# Patient Record
Sex: Male | Born: 1940 | Race: White | Hispanic: No | Marital: Married | Smoking: Current every day smoker
Health system: Southern US, Community
[De-identification: ages and names within clinical notes are randomized; demographics above are authoritative.]

## PROBLEM LIST (undated history)

## (undated) DIAGNOSIS — S82409A Unspecified fracture of shaft of unspecified fibula, initial encounter for closed fracture: Secondary | ICD-10-CM

## (undated) DIAGNOSIS — G250 Essential tremor: Secondary | ICD-10-CM

## (undated) DIAGNOSIS — H9190 Unspecified hearing loss, unspecified ear: Secondary | ICD-10-CM

## (undated) DIAGNOSIS — S82209A Unspecified fracture of shaft of unspecified tibia, initial encounter for closed fracture: Secondary | ICD-10-CM

## (undated) DIAGNOSIS — C801 Malignant (primary) neoplasm, unspecified: Secondary | ICD-10-CM

## (undated) DIAGNOSIS — I2510 Atherosclerotic heart disease of native coronary artery without angina pectoris: Secondary | ICD-10-CM

## (undated) DIAGNOSIS — N200 Calculus of kidney: Secondary | ICD-10-CM

## (undated) DIAGNOSIS — G629 Polyneuropathy, unspecified: Secondary | ICD-10-CM

## (undated) DIAGNOSIS — J449 Chronic obstructive pulmonary disease, unspecified: Secondary | ICD-10-CM

## (undated) DIAGNOSIS — I4891 Unspecified atrial fibrillation: Secondary | ICD-10-CM

## (undated) DIAGNOSIS — K219 Gastro-esophageal reflux disease without esophagitis: Secondary | ICD-10-CM

## (undated) DIAGNOSIS — I2699 Other pulmonary embolism without acute cor pulmonale: Secondary | ICD-10-CM

## (undated) DIAGNOSIS — E785 Hyperlipidemia, unspecified: Secondary | ICD-10-CM

## (undated) DIAGNOSIS — I10 Essential (primary) hypertension: Secondary | ICD-10-CM

## (undated) DIAGNOSIS — M8580 Other specified disorders of bone density and structure, unspecified site: Secondary | ICD-10-CM

## (undated) HISTORY — DX: Hyperlipidemia, unspecified: E78.5

## (undated) HISTORY — DX: Unspecified fracture of shaft of unspecified fibula, initial encounter for closed fracture: S82.409A

## (undated) HISTORY — DX: Unspecified fracture of shaft of unspecified tibia, initial encounter for closed fracture: S82.209A

## (undated) HISTORY — DX: Essential tremor: G25.0

## (undated) HISTORY — PX: TONSILLECTOMY: SUR1361

## (undated) HISTORY — DX: Unspecified hearing loss, unspecified ear: H91.90

## (undated) HISTORY — DX: Polyneuropathy, unspecified: G62.9

## (undated) HISTORY — DX: Atherosclerotic heart disease of native coronary artery without angina pectoris: I25.10

## (undated) HISTORY — PX: FIXATION KYPHOPLASTY: SHX860

## (undated) HISTORY — DX: Calculus of kidney: N20.0

## (undated) HISTORY — PX: CHOLECYSTECTOMY: SHX55

## (undated) HISTORY — DX: Other pulmonary embolism without acute cor pulmonale: I26.99

## (undated) HISTORY — PX: CARDIAC CATHETERIZATION: SHX172

## (undated) HISTORY — DX: Unspecified atrial fibrillation: I48.91

## (undated) HISTORY — PX: OTHER SURGICAL HISTORY: SHX169

---

## 1998-03-03 ENCOUNTER — Encounter: Admission: RE | Admit: 1998-03-03 | Discharge: 1998-06-01 | Payer: Self-pay | Admitting: Family Medicine

## 1999-03-24 ENCOUNTER — Ambulatory Visit (HOSPITAL_BASED_OUTPATIENT_CLINIC_OR_DEPARTMENT_OTHER): Admission: RE | Admit: 1999-03-24 | Discharge: 1999-03-24 | Payer: Self-pay | Admitting: Surgery

## 1999-07-02 ENCOUNTER — Emergency Department (HOSPITAL_COMMUNITY): Admission: EM | Admit: 1999-07-02 | Discharge: 1999-07-02 | Payer: Self-pay | Admitting: Emergency Medicine

## 1999-11-08 ENCOUNTER — Inpatient Hospital Stay (HOSPITAL_COMMUNITY): Admission: EM | Admit: 1999-11-08 | Discharge: 1999-11-12 | Payer: Self-pay | Admitting: Internal Medicine

## 1999-11-11 ENCOUNTER — Encounter: Payer: Self-pay | Admitting: Internal Medicine

## 1999-12-04 ENCOUNTER — Encounter: Payer: Self-pay | Admitting: Pulmonary Disease

## 1999-12-04 ENCOUNTER — Ambulatory Visit (HOSPITAL_COMMUNITY): Admission: RE | Admit: 1999-12-04 | Discharge: 1999-12-04 | Payer: Self-pay | Admitting: Pulmonary Disease

## 1999-12-15 ENCOUNTER — Ambulatory Visit (HOSPITAL_COMMUNITY): Admission: RE | Admit: 1999-12-15 | Discharge: 1999-12-15 | Payer: Self-pay | Admitting: Pulmonary Disease

## 1999-12-15 ENCOUNTER — Encounter: Payer: Self-pay | Admitting: Pulmonary Disease

## 2000-01-05 ENCOUNTER — Ambulatory Visit (HOSPITAL_COMMUNITY): Admission: RE | Admit: 2000-01-05 | Discharge: 2000-01-05 | Payer: Self-pay | Admitting: Gastroenterology

## 2000-01-29 ENCOUNTER — Ambulatory Visit: Admission: RE | Admit: 2000-01-29 | Discharge: 2000-01-29 | Payer: Self-pay | Admitting: Pulmonary Disease

## 2001-03-24 ENCOUNTER — Ambulatory Visit (HOSPITAL_BASED_OUTPATIENT_CLINIC_OR_DEPARTMENT_OTHER): Admission: RE | Admit: 2001-03-24 | Discharge: 2001-03-24 | Payer: Self-pay | Admitting: Pulmonary Disease

## 2002-07-30 ENCOUNTER — Ambulatory Visit (HOSPITAL_COMMUNITY): Admission: RE | Admit: 2002-07-30 | Discharge: 2002-07-30 | Payer: Self-pay | Admitting: Gastroenterology

## 2002-10-01 ENCOUNTER — Emergency Department (HOSPITAL_COMMUNITY): Admission: EM | Admit: 2002-10-01 | Discharge: 2002-10-01 | Payer: Self-pay | Admitting: Emergency Medicine

## 2003-12-06 ENCOUNTER — Ambulatory Visit (HOSPITAL_COMMUNITY): Admission: RE | Admit: 2003-12-06 | Discharge: 2003-12-06 | Payer: Self-pay | Admitting: Orthopedic Surgery

## 2004-09-13 ENCOUNTER — Encounter: Admission: RE | Admit: 2004-09-13 | Discharge: 2004-09-13 | Payer: Self-pay | Admitting: Gastroenterology

## 2004-09-21 ENCOUNTER — Encounter: Admission: RE | Admit: 2004-09-21 | Discharge: 2004-09-21 | Payer: Self-pay | Admitting: Gastroenterology

## 2005-01-06 ENCOUNTER — Emergency Department (HOSPITAL_COMMUNITY): Admission: EM | Admit: 2005-01-06 | Discharge: 2005-01-06 | Payer: Self-pay | Admitting: Emergency Medicine

## 2005-01-13 ENCOUNTER — Emergency Department (HOSPITAL_COMMUNITY): Admission: EM | Admit: 2005-01-13 | Discharge: 2005-01-13 | Payer: Self-pay | Admitting: Emergency Medicine

## 2005-01-19 ENCOUNTER — Encounter: Admission: RE | Admit: 2005-01-19 | Discharge: 2005-01-19 | Payer: Self-pay | Admitting: Family Medicine

## 2005-01-31 ENCOUNTER — Observation Stay (HOSPITAL_COMMUNITY): Admission: RE | Admit: 2005-01-31 | Discharge: 2005-02-01 | Payer: Self-pay | Admitting: General Surgery

## 2005-01-31 ENCOUNTER — Encounter (INDEPENDENT_AMBULATORY_CARE_PROVIDER_SITE_OTHER): Payer: Self-pay | Admitting: *Deleted

## 2005-07-06 ENCOUNTER — Encounter: Admission: RE | Admit: 2005-07-06 | Discharge: 2005-07-06 | Payer: Self-pay | Admitting: Family Medicine

## 2005-09-25 ENCOUNTER — Encounter: Admission: RE | Admit: 2005-09-25 | Discharge: 2005-09-25 | Payer: Self-pay | Admitting: Gastroenterology

## 2005-09-26 ENCOUNTER — Ambulatory Visit (HOSPITAL_COMMUNITY): Admission: RE | Admit: 2005-09-26 | Discharge: 2005-09-26 | Payer: Self-pay | Admitting: Gastroenterology

## 2005-09-26 ENCOUNTER — Encounter (INDEPENDENT_AMBULATORY_CARE_PROVIDER_SITE_OTHER): Payer: Self-pay | Admitting: *Deleted

## 2005-09-28 ENCOUNTER — Ambulatory Visit (HOSPITAL_COMMUNITY): Admission: RE | Admit: 2005-09-28 | Discharge: 2005-09-28 | Payer: Self-pay | Admitting: Gastroenterology

## 2005-10-02 ENCOUNTER — Ambulatory Visit (HOSPITAL_COMMUNITY): Admission: RE | Admit: 2005-10-02 | Discharge: 2005-10-02 | Payer: Self-pay | Admitting: Gastroenterology

## 2005-12-19 ENCOUNTER — Encounter: Admission: RE | Admit: 2005-12-19 | Discharge: 2005-12-19 | Payer: Self-pay | Admitting: Family Medicine

## 2005-12-26 ENCOUNTER — Ambulatory Visit (HOSPITAL_COMMUNITY): Admission: RE | Admit: 2005-12-26 | Discharge: 2005-12-26 | Payer: Self-pay | Admitting: Family Medicine

## 2006-01-10 HISTORY — PX: LUNG CANCER SURGERY: SHX702

## 2006-01-11 ENCOUNTER — Ambulatory Visit: Admission: RE | Admit: 2006-01-11 | Discharge: 2006-04-11 | Payer: Self-pay | Admitting: Radiation Oncology

## 2006-01-15 ENCOUNTER — Ambulatory Visit: Admission: RE | Admit: 2006-01-15 | Discharge: 2006-01-15 | Payer: Self-pay | Admitting: Thoracic Surgery

## 2006-01-16 ENCOUNTER — Ambulatory Visit (HOSPITAL_COMMUNITY): Admission: RE | Admit: 2006-01-16 | Discharge: 2006-01-16 | Payer: Self-pay | Admitting: Thoracic Surgery

## 2006-02-05 ENCOUNTER — Inpatient Hospital Stay (HOSPITAL_COMMUNITY): Admission: RE | Admit: 2006-02-05 | Discharge: 2006-02-10 | Payer: Self-pay | Admitting: Thoracic Surgery

## 2006-02-05 ENCOUNTER — Encounter (INDEPENDENT_AMBULATORY_CARE_PROVIDER_SITE_OTHER): Payer: Self-pay | Admitting: *Deleted

## 2006-02-07 ENCOUNTER — Ambulatory Visit: Payer: Self-pay | Admitting: Internal Medicine

## 2006-02-08 ENCOUNTER — Ambulatory Visit: Payer: Self-pay | Admitting: Internal Medicine

## 2006-02-12 ENCOUNTER — Encounter: Admission: RE | Admit: 2006-02-12 | Discharge: 2006-02-12 | Payer: Self-pay | Admitting: Thoracic Surgery

## 2006-02-27 ENCOUNTER — Encounter: Admission: RE | Admit: 2006-02-27 | Discharge: 2006-02-27 | Payer: Self-pay | Admitting: Thoracic Surgery

## 2006-03-07 LAB — CBC WITH DIFFERENTIAL/PLATELET
BASO%: 0.4 % (ref 0.0–2.0)
Basophils Absolute: 0 10*3/uL (ref 0.0–0.1)
EOS%: 3.4 % (ref 0.0–7.0)
Eosinophils Absolute: 0.3 10*3/uL (ref 0.0–0.5)
HCT: 39 % (ref 38.7–49.9)
HGB: 13.1 g/dL (ref 13.0–17.1)
LYMPH%: 16.3 % (ref 14.0–48.0)
MCH: 26.8 pg — ABNORMAL LOW (ref 28.0–33.4)
MCHC: 33.8 g/dL (ref 32.0–35.9)
MCV: 79.5 fL — ABNORMAL LOW (ref 81.6–98.0)
MONO#: 1.1 10*3/uL — ABNORMAL HIGH (ref 0.1–0.9)
MONO%: 11.5 % (ref 0.0–13.0)
NEUT#: 6.5 10*3/uL (ref 1.5–6.5)
NEUT%: 68.4 % (ref 40.0–75.0)
Platelets: 343 10*3/uL (ref 145–400)
RBC: 4.9 10*6/uL (ref 4.20–5.71)
RDW: 15.5 % — ABNORMAL HIGH (ref 11.2–14.6)
WBC: 9.5 10*3/uL (ref 4.0–10.0)
lymph#: 1.6 10*3/uL (ref 0.9–3.3)

## 2006-03-07 LAB — COMPREHENSIVE METABOLIC PANEL
ALT: 19 U/L (ref 0–40)
AST: 16 U/L (ref 0–37)
Albumin: 4.2 g/dL (ref 3.5–5.2)
Alkaline Phosphatase: 69 U/L (ref 39–117)
BUN: 18 mg/dL (ref 6–23)
CO2: 25 mEq/L (ref 19–32)
Calcium: 9.4 mg/dL (ref 8.4–10.5)
Chloride: 99 mEq/L (ref 96–112)
Creatinine, Ser: 0.9 mg/dL (ref 0.4–1.5)
Glucose, Bld: 93 mg/dL (ref 70–99)
Potassium: 4.4 mEq/L (ref 3.5–5.3)
Sodium: 136 mEq/L (ref 135–145)
Total Bilirubin: 0.3 mg/dL (ref 0.3–1.2)
Total Protein: 6.8 g/dL (ref 6.0–8.3)

## 2006-03-27 ENCOUNTER — Encounter: Admission: RE | Admit: 2006-03-27 | Discharge: 2006-03-27 | Payer: Self-pay | Admitting: Thoracic Surgery

## 2006-05-28 ENCOUNTER — Encounter: Admission: RE | Admit: 2006-05-28 | Discharge: 2006-05-28 | Payer: Self-pay | Admitting: Thoracic Surgery

## 2006-06-26 ENCOUNTER — Ambulatory Visit: Payer: Self-pay | Admitting: Internal Medicine

## 2006-07-01 LAB — CBC WITH DIFFERENTIAL/PLATELET
BASO%: 0.2 % (ref 0.0–2.0)
Basophils Absolute: 0 10*3/uL (ref 0.0–0.1)
EOS%: 0.6 % (ref 0.0–7.0)
Eosinophils Absolute: 0.1 10*3/uL (ref 0.0–0.5)
HCT: 37.9 % — ABNORMAL LOW (ref 38.7–49.9)
HGB: 12.8 g/dL — ABNORMAL LOW (ref 13.0–17.1)
LYMPH%: 7.7 % — ABNORMAL LOW (ref 14.0–48.0)
MCH: 27.2 pg — ABNORMAL LOW (ref 28.0–33.4)
MCHC: 33.8 g/dL (ref 32.0–35.9)
MCV: 80.5 fL — ABNORMAL LOW (ref 81.6–98.0)
MONO#: 0.3 10*3/uL (ref 0.1–0.9)
MONO%: 2.9 % (ref 0.0–13.0)
NEUT#: 8 10*3/uL — ABNORMAL HIGH (ref 1.5–6.5)
NEUT%: 88.6 % — ABNORMAL HIGH (ref 40.0–75.0)
Platelets: 251 10*3/uL (ref 145–400)
RBC: 4.71 10*6/uL (ref 4.20–5.71)
RDW: 16.4 % — ABNORMAL HIGH (ref 11.2–14.6)
WBC: 9 10*3/uL (ref 4.0–10.0)
lymph#: 0.7 10*3/uL — ABNORMAL LOW (ref 0.9–3.3)

## 2006-07-01 LAB — COMPREHENSIVE METABOLIC PANEL
ALT: 22 U/L (ref 0–40)
AST: 16 U/L (ref 0–37)
Albumin: 4.4 g/dL (ref 3.5–5.2)
Alkaline Phosphatase: 53 U/L (ref 39–117)
BUN: 23 mg/dL (ref 6–23)
CO2: 18 mEq/L — ABNORMAL LOW (ref 19–32)
Calcium: 9.5 mg/dL (ref 8.4–10.5)
Chloride: 102 mEq/L (ref 96–112)
Creatinine, Ser: 1.02 mg/dL (ref 0.40–1.50)
Glucose, Bld: 302 mg/dL — ABNORMAL HIGH (ref 70–99)
Potassium: 4.7 mEq/L (ref 3.5–5.3)
Sodium: 137 mEq/L (ref 135–145)
Total Bilirubin: 0.2 mg/dL — ABNORMAL LOW (ref 0.3–1.2)
Total Protein: 6.9 g/dL (ref 6.0–8.3)

## 2006-07-02 ENCOUNTER — Ambulatory Visit (HOSPITAL_COMMUNITY): Admission: RE | Admit: 2006-07-02 | Discharge: 2006-07-02 | Payer: Self-pay | Admitting: Internal Medicine

## 2006-09-17 ENCOUNTER — Ambulatory Visit: Payer: Self-pay | Admitting: Internal Medicine

## 2006-09-19 LAB — CBC WITH DIFFERENTIAL/PLATELET
BASO%: 0.3 % (ref 0.0–2.0)
Basophils Absolute: 0 10*3/uL (ref 0.0–0.1)
EOS%: 3.8 % (ref 0.0–7.0)
Eosinophils Absolute: 0.3 10*3/uL (ref 0.0–0.5)
HCT: 40.2 % (ref 38.7–49.9)
HGB: 13.6 g/dL (ref 13.0–17.1)
LYMPH%: 18.1 % (ref 14.0–48.0)
MCH: 27.6 pg — ABNORMAL LOW (ref 28.0–33.4)
MCHC: 33.9 g/dL (ref 32.0–35.9)
MCV: 81.5 fL — ABNORMAL LOW (ref 81.6–98.0)
MONO#: 0.7 10*3/uL (ref 0.1–0.9)
MONO%: 9.8 % (ref 0.0–13.0)
NEUT#: 4.5 10*3/uL (ref 1.5–6.5)
NEUT%: 68 % (ref 40.0–75.0)
Platelets: 265 10*3/uL (ref 145–400)
RBC: 4.94 10*6/uL (ref 4.20–5.71)
RDW: 16.1 % — ABNORMAL HIGH (ref 11.2–14.6)
WBC: 6.6 10*3/uL (ref 4.0–10.0)
lymph#: 1.2 10*3/uL (ref 0.9–3.3)

## 2006-09-19 LAB — COMPREHENSIVE METABOLIC PANEL
ALT: 22 U/L (ref 0–53)
AST: 15 U/L (ref 0–37)
Albumin: 4.2 g/dL (ref 3.5–5.2)
Alkaline Phosphatase: 57 U/L (ref 39–117)
BUN: 18 mg/dL (ref 6–23)
CO2: 25 mEq/L (ref 19–32)
Calcium: 9.7 mg/dL (ref 8.4–10.5)
Chloride: 102 mEq/L (ref 96–112)
Creatinine, Ser: 0.88 mg/dL (ref 0.40–1.50)
Glucose, Bld: 162 mg/dL — ABNORMAL HIGH (ref 70–99)
Potassium: 4.4 mEq/L (ref 3.5–5.3)
Sodium: 140 mEq/L (ref 135–145)
Total Bilirubin: 0.4 mg/dL (ref 0.3–1.2)
Total Protein: 6.7 g/dL (ref 6.0–8.3)

## 2006-09-23 ENCOUNTER — Ambulatory Visit (HOSPITAL_COMMUNITY): Admission: RE | Admit: 2006-09-23 | Discharge: 2006-09-23 | Payer: Self-pay | Admitting: Internal Medicine

## 2006-09-25 ENCOUNTER — Encounter: Admission: RE | Admit: 2006-09-25 | Discharge: 2006-09-25 | Payer: Self-pay | Admitting: Thoracic Surgery

## 2007-03-18 ENCOUNTER — Ambulatory Visit: Payer: Self-pay | Admitting: Internal Medicine

## 2007-03-20 LAB — COMPREHENSIVE METABOLIC PANEL
ALT: 19 U/L (ref 0–53)
AST: 14 U/L (ref 0–37)
Albumin: 4.2 g/dL (ref 3.5–5.2)
Alkaline Phosphatase: 58 U/L (ref 39–117)
BUN: 23 mg/dL (ref 6–23)
CO2: 21 mEq/L (ref 19–32)
Calcium: 9.4 mg/dL (ref 8.4–10.5)
Chloride: 105 mEq/L (ref 96–112)
Creatinine, Ser: 0.93 mg/dL (ref 0.40–1.50)
Glucose, Bld: 133 mg/dL — ABNORMAL HIGH (ref 70–99)
Potassium: 4.2 mEq/L (ref 3.5–5.3)
Sodium: 138 mEq/L (ref 135–145)
Total Bilirubin: 0.2 mg/dL — ABNORMAL LOW (ref 0.3–1.2)
Total Protein: 6.5 g/dL (ref 6.0–8.3)

## 2007-03-20 LAB — CBC WITH DIFFERENTIAL/PLATELET
BASO%: 0.4 % (ref 0.0–2.0)
Basophils Absolute: 0 10*3/uL (ref 0.0–0.1)
EOS%: 2.9 % (ref 0.0–7.0)
Eosinophils Absolute: 0.3 10*3/uL (ref 0.0–0.5)
HCT: 39.3 % (ref 38.7–49.9)
HGB: 13.7 g/dL (ref 13.0–17.1)
LYMPH%: 20 % (ref 14.0–48.0)
MCH: 28.1 pg (ref 28.0–33.4)
MCHC: 34.8 g/dL (ref 32.0–35.9)
MCV: 80.7 fL — ABNORMAL LOW (ref 81.6–98.0)
MONO#: 0.8 10*3/uL (ref 0.1–0.9)
MONO%: 8.6 % (ref 0.0–13.0)
NEUT#: 6.2 10*3/uL (ref 1.5–6.5)
NEUT%: 68.1 % (ref 40.0–75.0)
Platelets: 279 10*3/uL (ref 145–400)
RBC: 4.87 10*6/uL (ref 4.20–5.71)
RDW: 15.6 % — ABNORMAL HIGH (ref 11.2–14.6)
WBC: 9.2 10*3/uL (ref 4.0–10.0)
lymph#: 1.8 10*3/uL (ref 0.9–3.3)

## 2007-03-24 ENCOUNTER — Ambulatory Visit (HOSPITAL_COMMUNITY): Admission: RE | Admit: 2007-03-24 | Discharge: 2007-03-24 | Payer: Self-pay | Admitting: Internal Medicine

## 2007-04-09 ENCOUNTER — Ambulatory Visit: Payer: Self-pay | Admitting: Thoracic Surgery

## 2007-07-16 ENCOUNTER — Ambulatory Visit: Payer: Self-pay | Admitting: Thoracic Surgery

## 2007-07-16 ENCOUNTER — Encounter: Admission: RE | Admit: 2007-07-16 | Discharge: 2007-07-16 | Payer: Self-pay | Admitting: Thoracic Surgery

## 2007-09-12 ENCOUNTER — Ambulatory Visit: Payer: Self-pay | Admitting: Internal Medicine

## 2007-09-16 LAB — CBC WITH DIFFERENTIAL/PLATELET
BASO%: 0.4 % (ref 0.0–2.0)
Basophils Absolute: 0 10*3/uL (ref 0.0–0.1)
EOS%: 2.2 % (ref 0.0–7.0)
Eosinophils Absolute: 0.2 10*3/uL (ref 0.0–0.5)
HCT: 40 % (ref 38.7–49.9)
HGB: 13.7 g/dL (ref 13.0–17.1)
LYMPH%: 17.7 % (ref 14.0–48.0)
MCH: 28 pg (ref 28.0–33.4)
MCHC: 34.1 g/dL (ref 32.0–35.9)
MCV: 81.9 fL (ref 81.6–98.0)
MONO#: 0.6 10*3/uL (ref 0.1–0.9)
MONO%: 7.9 % (ref 0.0–13.0)
NEUT#: 5.2 10*3/uL (ref 1.5–6.5)
NEUT%: 71.8 % (ref 40.0–75.0)
Platelets: 232 10*3/uL (ref 145–400)
RBC: 4.89 10*6/uL (ref 4.20–5.71)
RDW: 16.1 % — ABNORMAL HIGH (ref 11.2–14.6)
WBC: 7.2 10*3/uL (ref 4.0–10.0)
lymph#: 1.3 10*3/uL (ref 0.9–3.3)

## 2007-09-16 LAB — COMPREHENSIVE METABOLIC PANEL
ALT: 22 U/L (ref 0–53)
AST: 15 U/L (ref 0–37)
Albumin: 4.3 g/dL (ref 3.5–5.2)
Alkaline Phosphatase: 52 U/L (ref 39–117)
BUN: 23 mg/dL (ref 6–23)
CO2: 23 mEq/L (ref 19–32)
Calcium: 9.6 mg/dL (ref 8.4–10.5)
Chloride: 104 mEq/L (ref 96–112)
Creatinine, Ser: 1.02 mg/dL (ref 0.40–1.50)
Glucose, Bld: 173 mg/dL — ABNORMAL HIGH (ref 70–99)
Potassium: 4.4 mEq/L (ref 3.5–5.3)
Sodium: 138 mEq/L (ref 135–145)
Total Bilirubin: 0.5 mg/dL (ref 0.3–1.2)
Total Protein: 6.8 g/dL (ref 6.0–8.3)

## 2007-09-18 ENCOUNTER — Ambulatory Visit (HOSPITAL_COMMUNITY): Admission: RE | Admit: 2007-09-18 | Discharge: 2007-09-18 | Payer: Self-pay | Admitting: Internal Medicine

## 2007-10-14 ENCOUNTER — Ambulatory Visit: Payer: Self-pay | Admitting: Thoracic Surgery

## 2008-03-15 ENCOUNTER — Ambulatory Visit: Payer: Self-pay | Admitting: Internal Medicine

## 2008-03-18 LAB — COMPREHENSIVE METABOLIC PANEL
ALT: 19 U/L (ref 0–53)
AST: 12 U/L (ref 0–37)
Albumin: 4 g/dL (ref 3.5–5.2)
Alkaline Phosphatase: 51 U/L (ref 39–117)
BUN: 24 mg/dL — ABNORMAL HIGH (ref 6–23)
CO2: 22 mEq/L (ref 19–32)
Calcium: 9.1 mg/dL (ref 8.4–10.5)
Chloride: 102 mEq/L (ref 96–112)
Creatinine, Ser: 1.03 mg/dL (ref 0.40–1.50)
Glucose, Bld: 305 mg/dL — ABNORMAL HIGH (ref 70–99)
Potassium: 4.3 mEq/L (ref 3.5–5.3)
Sodium: 136 mEq/L (ref 135–145)
Total Bilirubin: 0.3 mg/dL (ref 0.3–1.2)
Total Protein: 6.3 g/dL (ref 6.0–8.3)

## 2008-03-18 LAB — CBC WITH DIFFERENTIAL/PLATELET
BASO%: 0.2 % (ref 0.0–2.0)
Basophils Absolute: 0 10*3/uL (ref 0.0–0.1)
EOS%: 2.7 % (ref 0.0–7.0)
Eosinophils Absolute: 0.2 10*3/uL (ref 0.0–0.5)
HCT: 37.3 % — ABNORMAL LOW (ref 38.7–49.9)
HGB: 12.9 g/dL — ABNORMAL LOW (ref 13.0–17.1)
LYMPH%: 15.2 % (ref 14.0–48.0)
MCH: 28.1 pg (ref 28.0–33.4)
MCHC: 34.4 g/dL (ref 32.0–35.9)
MCV: 81.7 fL (ref 81.6–98.0)
MONO#: 0.7 10*3/uL (ref 0.1–0.9)
MONO%: 8 % (ref 0.0–13.0)
NEUT#: 6.1 10*3/uL (ref 1.5–6.5)
NEUT%: 73.9 % (ref 40.0–75.0)
Platelets: 262 10*3/uL (ref 145–400)
RBC: 4.57 10*6/uL (ref 4.20–5.71)
RDW: 15.6 % — ABNORMAL HIGH (ref 11.2–14.6)
WBC: 8.3 10*3/uL (ref 4.0–10.0)
lymph#: 1.3 10*3/uL (ref 0.9–3.3)

## 2008-03-22 ENCOUNTER — Ambulatory Visit (HOSPITAL_COMMUNITY): Admission: RE | Admit: 2008-03-22 | Discharge: 2008-03-22 | Payer: Self-pay | Admitting: Internal Medicine

## 2008-03-31 ENCOUNTER — Ambulatory Visit: Payer: Self-pay | Admitting: Thoracic Surgery

## 2008-09-13 ENCOUNTER — Ambulatory Visit: Payer: Self-pay | Admitting: Internal Medicine

## 2008-09-15 LAB — COMPREHENSIVE METABOLIC PANEL
ALT: 20 U/L (ref 0–53)
AST: 17 U/L (ref 0–37)
Albumin: 4.2 g/dL (ref 3.5–5.2)
Alkaline Phosphatase: 48 U/L (ref 39–117)
BUN: 26 mg/dL — ABNORMAL HIGH (ref 6–23)
CO2: 21 mEq/L (ref 19–32)
Calcium: 9.5 mg/dL (ref 8.4–10.5)
Chloride: 104 mEq/L (ref 96–112)
Creatinine, Ser: 1 mg/dL (ref 0.40–1.50)
Glucose, Bld: 190 mg/dL — ABNORMAL HIGH (ref 70–99)
Potassium: 4 mEq/L (ref 3.5–5.3)
Sodium: 139 mEq/L (ref 135–145)
Total Bilirubin: 0.2 mg/dL — ABNORMAL LOW (ref 0.3–1.2)
Total Protein: 6.7 g/dL (ref 6.0–8.3)

## 2008-09-15 LAB — CBC WITH DIFFERENTIAL/PLATELET
BASO%: 0.1 % (ref 0.0–2.0)
Basophils Absolute: 0 10*3/uL (ref 0.0–0.1)
EOS%: 4.4 % (ref 0.0–7.0)
Eosinophils Absolute: 0.3 10*3/uL (ref 0.0–0.5)
HCT: 39.2 % (ref 38.7–49.9)
HGB: 13.2 g/dL (ref 13.0–17.1)
LYMPH%: 17.9 % (ref 14.0–48.0)
MCH: 28.1 pg (ref 28.0–33.4)
MCHC: 33.7 g/dL (ref 32.0–35.9)
MCV: 83.4 fL (ref 81.6–98.0)
MONO#: 0.7 10*3/uL (ref 0.1–0.9)
MONO%: 9 % (ref 0.0–13.0)
NEUT#: 5.2 10*3/uL (ref 1.5–6.5)
NEUT%: 68.6 % (ref 40.0–75.0)
Platelets: 231 10*3/uL (ref 145–400)
RBC: 4.7 10*6/uL (ref 4.20–5.71)
RDW: 15.8 % — ABNORMAL HIGH (ref 11.2–14.6)
WBC: 7.7 10*3/uL (ref 4.0–10.0)
lymph#: 1.4 10*3/uL (ref 0.9–3.3)

## 2008-09-16 ENCOUNTER — Ambulatory Visit (HOSPITAL_COMMUNITY): Admission: RE | Admit: 2008-09-16 | Discharge: 2008-09-16 | Payer: Self-pay | Admitting: Internal Medicine

## 2008-09-21 ENCOUNTER — Ambulatory Visit: Payer: Self-pay | Admitting: Thoracic Surgery

## 2009-04-06 ENCOUNTER — Encounter: Admission: RE | Admit: 2009-04-06 | Discharge: 2009-04-06 | Payer: Self-pay | Admitting: Thoracic Surgery

## 2009-04-06 ENCOUNTER — Ambulatory Visit: Payer: Self-pay | Admitting: Thoracic Surgery

## 2009-05-26 ENCOUNTER — Ambulatory Visit: Payer: Self-pay | Admitting: Surgery

## 2009-05-26 ENCOUNTER — Encounter (INDEPENDENT_AMBULATORY_CARE_PROVIDER_SITE_OTHER): Payer: Self-pay | Admitting: Family Medicine

## 2009-05-26 ENCOUNTER — Ambulatory Visit: Admission: RE | Admit: 2009-05-26 | Discharge: 2009-05-26 | Payer: Self-pay | Admitting: Family Medicine

## 2009-07-23 ENCOUNTER — Encounter: Admission: RE | Admit: 2009-07-23 | Discharge: 2009-07-23 | Payer: Self-pay | Admitting: Orthopedic Surgery

## 2009-09-14 ENCOUNTER — Ambulatory Visit: Payer: Self-pay | Admitting: Internal Medicine

## 2009-09-16 LAB — CBC WITH DIFFERENTIAL/PLATELET
BASO%: 0.3 % (ref 0.0–2.0)
Basophils Absolute: 0 10*3/uL (ref 0.0–0.1)
EOS%: 2.6 % (ref 0.0–7.0)
Eosinophils Absolute: 0.2 10*3/uL (ref 0.0–0.5)
HCT: 39.8 % (ref 38.4–49.9)
HGB: 13.4 g/dL (ref 13.0–17.1)
LYMPH%: 19.7 % (ref 14.0–49.0)
MCH: 28.9 pg (ref 27.2–33.4)
MCHC: 33.6 g/dL (ref 32.0–36.0)
MCV: 85.8 fL (ref 79.3–98.0)
MONO#: 0.7 10*3/uL (ref 0.1–0.9)
MONO%: 8.8 % (ref 0.0–14.0)
NEUT#: 5.1 10*3/uL (ref 1.5–6.5)
NEUT%: 68.6 % (ref 39.0–75.0)
Platelets: 240 10*3/uL (ref 140–400)
RBC: 4.64 10*6/uL (ref 4.20–5.82)
RDW: 15.7 % — ABNORMAL HIGH (ref 11.0–14.6)
WBC: 7.4 10*3/uL (ref 4.0–10.3)
lymph#: 1.5 10*3/uL (ref 0.9–3.3)

## 2009-09-16 LAB — COMPREHENSIVE METABOLIC PANEL
ALT: 27 U/L (ref 0–53)
AST: 24 U/L (ref 0–37)
Albumin: 3.6 g/dL (ref 3.5–5.2)
Alkaline Phosphatase: 43 U/L (ref 39–117)
BUN: 14 mg/dL (ref 6–23)
CO2: 26 mEq/L (ref 19–32)
Calcium: 9.4 mg/dL (ref 8.4–10.5)
Chloride: 106 mEq/L (ref 96–112)
Creatinine, Ser: 0.86 mg/dL (ref 0.40–1.50)
Glucose, Bld: 144 mg/dL — ABNORMAL HIGH (ref 70–99)
Potassium: 4.3 mEq/L (ref 3.5–5.3)
Sodium: 138 mEq/L (ref 135–145)
Total Bilirubin: 0.5 mg/dL (ref 0.3–1.2)
Total Protein: 6.2 g/dL (ref 6.0–8.3)

## 2009-09-19 ENCOUNTER — Ambulatory Visit (HOSPITAL_COMMUNITY): Admission: RE | Admit: 2009-09-19 | Discharge: 2009-09-19 | Payer: Self-pay | Admitting: Internal Medicine

## 2009-09-21 ENCOUNTER — Ambulatory Visit: Payer: Self-pay | Admitting: Thoracic Surgery

## 2010-05-19 ENCOUNTER — Encounter: Payer: Self-pay | Admitting: Physical Medicine and Rehabilitation

## 2010-05-23 ENCOUNTER — Ambulatory Visit (HOSPITAL_COMMUNITY): Admission: RE | Admit: 2010-05-23 | Discharge: 2010-05-23 | Payer: Self-pay | Admitting: Interventional Radiology

## 2010-06-06 ENCOUNTER — Ambulatory Visit (HOSPITAL_COMMUNITY): Admission: RE | Admit: 2010-06-06 | Discharge: 2010-06-06 | Payer: Self-pay | Admitting: Interventional Radiology

## 2010-06-07 ENCOUNTER — Encounter: Payer: Self-pay | Admitting: Interventional Radiology

## 2010-09-13 ENCOUNTER — Ambulatory Visit: Payer: Self-pay | Admitting: Internal Medicine

## 2010-09-15 LAB — CBC WITH DIFFERENTIAL/PLATELET
BASO%: 0.3 % (ref 0.0–2.0)
Basophils Absolute: 0 10*3/uL (ref 0.0–0.1)
EOS%: 2.7 % (ref 0.0–7.0)
Eosinophils Absolute: 0.3 10*3/uL (ref 0.0–0.5)
HCT: 41.4 % (ref 38.4–49.9)
HGB: 14.1 g/dL (ref 13.0–17.1)
LYMPH%: 16.9 % (ref 14.0–49.0)
MCH: 28.5 pg (ref 27.2–33.4)
MCHC: 34.1 g/dL (ref 32.0–36.0)
MCV: 83.6 fL (ref 79.3–98.0)
MONO#: 0.7 10*3/uL (ref 0.1–0.9)
MONO%: 7.4 % (ref 0.0–14.0)
NEUT#: 6.7 10*3/uL — ABNORMAL HIGH (ref 1.5–6.5)
NEUT%: 72.7 % (ref 39.0–75.0)
Platelets: 274 10*3/uL (ref 140–400)
RBC: 4.95 10*6/uL (ref 4.20–5.82)
RDW: 16.1 % — ABNORMAL HIGH (ref 11.0–14.6)
WBC: 9.3 10*3/uL (ref 4.0–10.3)
lymph#: 1.6 10*3/uL (ref 0.9–3.3)

## 2010-09-15 LAB — COMPREHENSIVE METABOLIC PANEL
ALT: 26 U/L (ref 0–53)
AST: 22 U/L (ref 0–37)
Albumin: 3.8 g/dL (ref 3.5–5.2)
Alkaline Phosphatase: 47 U/L (ref 39–117)
BUN: 24 mg/dL — ABNORMAL HIGH (ref 6–23)
CO2: 30 mEq/L (ref 19–32)
Calcium: 9.5 mg/dL (ref 8.4–10.5)
Chloride: 103 mEq/L (ref 96–112)
Creatinine, Ser: 1.17 mg/dL (ref 0.40–1.50)
Glucose, Bld: 178 mg/dL — ABNORMAL HIGH (ref 70–99)
Potassium: 4.3 mEq/L (ref 3.5–5.3)
Sodium: 139 mEq/L (ref 135–145)
Total Bilirubin: 0.6 mg/dL (ref 0.3–1.2)
Total Protein: 6.4 g/dL (ref 6.0–8.3)

## 2010-09-18 ENCOUNTER — Ambulatory Visit (HOSPITAL_COMMUNITY): Admission: RE | Admit: 2010-09-18 | Discharge: 2010-09-18 | Payer: Self-pay | Admitting: Internal Medicine

## 2010-10-30 ENCOUNTER — Encounter
Admission: RE | Admit: 2010-10-30 | Discharge: 2010-10-30 | Payer: Self-pay | Source: Home / Self Care | Attending: Physical Medicine and Rehabilitation | Admitting: Physical Medicine and Rehabilitation

## 2010-12-02 ENCOUNTER — Other Ambulatory Visit: Payer: Self-pay | Admitting: Internal Medicine

## 2010-12-02 DIAGNOSIS — C3490 Malignant neoplasm of unspecified part of unspecified bronchus or lung: Secondary | ICD-10-CM

## 2010-12-03 ENCOUNTER — Encounter: Payer: Self-pay | Admitting: Family Medicine

## 2011-01-28 LAB — BASIC METABOLIC PANEL
BUN: 21 mg/dL (ref 6–23)
CO2: 24 mEq/L (ref 19–32)
Calcium: 9.2 mg/dL (ref 8.4–10.5)
Chloride: 108 mEq/L (ref 96–112)
Creatinine, Ser: 0.88 mg/dL (ref 0.4–1.5)
GFR calc Af Amer: >60 mL/min (ref >60)
GFR calc non Af Amer: >60 mL/min (ref >60)
Glucose, Bld: 127 mg/dL — ABNORMAL HIGH (ref 70–99)
Potassium: 4 mEq/L (ref 3.5–5.1)
Sodium: 140 mEq/L (ref 135–145)

## 2011-01-28 LAB — GLUCOSE, CAPILLARY: Glucose-Capillary: 121 mg/dL — ABNORMAL HIGH (ref 70–99)

## 2011-01-28 LAB — CBC
HCT: 39.6 % (ref 39.0–52.0)
Hemoglobin: 12.8 g/dL — ABNORMAL LOW (ref 13.0–17.0)
MCH: 27.5 pg (ref 26.0–34.0)
MCHC: 32.4 g/dL (ref 30.0–36.0)
MCV: 84.7 fL (ref 78.0–100.0)
Platelets: 207 10*3/uL (ref 150–400)
RBC: 4.67 MIL/uL (ref 4.22–5.81)
RDW: 16.4 % — ABNORMAL HIGH (ref 11.5–15.5)
WBC: 7 10*3/uL (ref 4.0–10.5)

## 2011-01-28 LAB — DIFFERENTIAL
Basophils Absolute: 0 10*3/uL (ref 0.0–0.1)
Basophils Relative: 1 % (ref 0–1)
Eosinophils Absolute: 0.4 10*3/uL (ref 0.0–0.7)
Eosinophils Relative: 5 % (ref 0–5)
Lymphocytes Relative: 15 % (ref 12–46)
Lymphs Abs: 1.1 10*3/uL (ref 0.7–4.0)
Monocytes Absolute: 0.6 10*3/uL (ref 0.1–1.0)
Monocytes Relative: 9 % (ref 3–12)
Neutro Abs: 4.9 10*3/uL (ref 1.7–7.7)
Neutrophils Relative %: 71 % (ref 43–77)

## 2011-01-28 LAB — PROTIME-INR
INR: 0.98 (ref 0.00–1.49)
Prothrombin Time: 12.9 seconds (ref 11.6–15.2)

## 2011-01-28 LAB — APTT: aPTT: 25 seconds (ref 24–37)

## 2011-03-27 ENCOUNTER — Other Ambulatory Visit (HOSPITAL_COMMUNITY): Payer: Self-pay | Admitting: Gastroenterology

## 2011-03-27 DIAGNOSIS — R112 Nausea with vomiting, unspecified: Secondary | ICD-10-CM

## 2011-03-27 NOTE — Assessment & Plan Note (Signed)
OFFICE VISIT   HARLEY, FITZWATER  DOB:  [birth year]                                        October 14, 2007  CHART #:  [id number]   The patient came for followup today.  His blood pressure was 118/71.  Pulse 83.  Respirations 18.  Sats were 94%.  Lungs were clear to  auscultation and percussion.  Heart, regular sinus rhythm.  The patient  was doing well.  His recent CT scan showed no evidence of recurrence.  He has another scheduled in 6 months.  We will see him at that time  after his next CT scan.   Nicanor Alcon, M.D.  Electronically Signed   DPB/MEDQ  D:  10/14/2007  T:  10/14/2007  Job:  [id number]

## 2011-03-27 NOTE — Assessment & Plan Note (Signed)
OFFICE VISIT   Paul [name], Paul [name]  DOB:  04-27-1941                                        September 21, 2008  CHART #:  [id number]   The patient comes today.  His CT scan showed resolution of the ill-  defined opacity in the left lung base with no evidence of recurrent  cancer.  He is now 2-1/2 years since his surgery.  So, I will then see  him back in 6 months with a chest x-ray.  His blood pressure was 141/82,  pulse 97, respirations 20, and sats were 93%.   Nicanor Alcon, M.D.  Electronically Signed   DPB/MEDQ  D:  09/21/2008  T:  09/21/2008  Job:  [id number]   cc:   [name], MD

## 2011-03-27 NOTE — Letter (Signed)
Mar 31, 2008   [name], [place] [place], [place] [address]   Re:  Paul [name], Paul [name]             DOB:  12-28-1940   Dear Julien Nordmann,   I saw Paul [name] today and reviewed his CT scan and it looks like these  are all postoperative changes.  I am not too sure about these irregular  densities, but I would agree with a followup in six months with a CT  scan.   He has recently recovered from a sinus infection.   His blood pressure is 140/83, pulse 88, respirations 18, sats were 95%.  Lungs were clear to auscultation and percussion.   Nicanor Alcon, M.D.  Electronically Signed   DPB/MEDQ  D:  03/31/2008  T:  03/31/2008  Job:  [id number]

## 2011-03-27 NOTE — Assessment & Plan Note (Signed)
OFFICE VISIT   MYLAN, SCHWARZ  DOB:  [birth year]                                        Apr 06, 2009  CHART #:  [id number]   The patient came today for followup.  His chest x-ray showed no evidence  of recurrence of his cancer.  There is chronic bronchitic changes.  He  has got another CT scan scheduled in November.  His blood pressure was  140/80, pulse 80, respirations 18, and sats were 94%.  Lungs are clear  to auscultation and percussion.  He is stable overall.  We will see him  back again in early December.   Nicanor Alcon, M.D.  Electronically Signed   DPB/MEDQ  D:  04/06/2009  T:  04/06/2009  Job:  [id number]

## 2011-03-27 NOTE — Assessment & Plan Note (Signed)
OFFICE VISIT   YAACOV, KOZIOL  DOB:  [birth year]                                        Apr 09, 2007  CHART #:  [id number]   Serita Grammes came in for followup today.  He had a CT scan done by Dr.  Julien Nordmann, which was negative.  There were some inflammatory nodules in  the right middle lobe but where we did his seed implantation in the left  lower lobe was completely negative.  He is doing well overall, except  for a sinus infection.  His blood pressure is 114/69.  Pulse 96.  Respirations 18.  Saturations are 96%.  We will see him in 3 months with  a chest x-ray.  He will get a repeat CT in 6 months.   Nicanor Alcon, M.D.  Electronically Signed   DPB/MEDQ  D:  04/09/2007  T:  04/09/2007  Job:  [id number]

## 2011-03-27 NOTE — Letter (Signed)
September 21, 2009   Fanny Bien. Julien Nordmann, [place] [place], [place] [address]   Re:  ASH, MCELWAIN             DOB:  1941/10/11   Dear Dr. Julien Nordmann;   I saw the patient today, and a CT scan showed no evidence of recurrence  of his cancer, now 3-1/2 years since the surgery.  He and I had a long  discussion about how long to continue the CT scans, and I suggest at  least for several more years of once a year.  I will let you following  from now on.  I will be happy to see him if he develops any future  problems.  His blood pressure was 111/67, pulse 86, respirations 20,  sats were 92%.   Nicanor Alcon, M.D.  Electronically Signed   DPB/MEDQ  D:  09/21/2009  T:  09/21/2009  Job:  [id number]

## 2011-03-27 NOTE — Letter (Signed)
July 16, 2007   [name], [address] [place], [place] [address]   Re:  Paul, POSTLEWAITE             DOB:  10/02/1941   Dear Julien Nordmann:   I saw Paul [name] for followup today.  His chest x-ray shows no evidence  of recurrence.  He looks good and feels the best I have seen him in a  long time.  He will have another CT scan done in November and we will  see him in December.  He has had no medical problems since we saw him  last.   His blood pressure was 107/67, pulse 83, respirations 18, SATs 94%.  The  lungs were clear to auscultation and percussion.   Nicanor Alcon, M.D.  Electronically Signed   DPB/MEDQ  D:  07/16/2007  T:  07/17/2007  Job:  [id number]

## 2011-03-30 NOTE — Consult Note (Signed)
NAME:  Paul [name], Paul [name] NO.:  [id number]   MEDICAL RECORD NO.:  [id number]          PATIENT TYPE:  INP   LOCATION:  3308                         FACILITY:  [facility]   PHYSICIAN:  [name], MD  DATE OF BIRTH:  [birth year]   DATE OF CONSULTATION:  02/07/2006  DATE OF DISCHARGE:                                   CONSULTATION   REASON FOR CONSULTATION:  Lung cancer.   REFERRING PHYSICIAN:  Nicanor Alcon, M.D.   HISTORY OF PRESENT ILLNESS:  Paul [name] is a pleasant 70 year old white  male smoker, with a known left lower lobe nodule since at least March 2006,  followed up with CTs on a regular basis, which lately had become more  prominent, with a PET scan showing activity in the area with an SUV of 5.2  for a 1.2 cm mass, along with a right paratracheal lymph node measuring 2.3  x 0.8 cm, with an SUV of 2.5.  The rest of the PET scan was negative for  metastasis.  MRI of the brain was essentially negative.  He underwent wedge  resection of the left lower lobe with node sampling and seed implant on  February 05, 2006.  Pathology returned positive for adenocarcinoma, 1.5 cm,  well-differentiated, mixed acinar and bronchoalveolar types, extending to  but not through, the visceral pleura, negative parenchymal margins.  Lymph  nodes at 9L and 11L were negative.  In addition, pleural sample showed  fibrovascular adhesions and the soft tissue on the chest wall mass was  nodular fibrosis.  He is T1, N0, Mx.  We were asked to see the patient,  anticipating that he will require follow-up on a periodic basis at the  regional cancer center once discharged.   PAST MEDICAL HISTORY:  1.  Emphysema, asthmatic bronchitis.  2.  Diabetes mellitus type 2.  3.  History of tobacco use.  4.  Hypertension.  5.  Peripheral neuropathy.  6.  BPH, status post TURP.  7.  Known right adrenal myelolipoma.  8.  Obesity.  9.  Essential tremors.  10. Status post MI in 1989, status post  angioplasty.  11. GERD.  12. History of osteoarthritis.  55. History of sigmoid colon AVM in 2003, as well as internal hemorrhoids,      followed by Dr. Oletta Lamas in the past.   SURGERY:  1.  Status post left VATS, wedge resection of the left lower lobe, with node      sampling and seed implant on February 05, 2006, by Dr. Arlyce Dice.  2.  Status post cholecystectomy.  3.  Status post left meniscectomy of the left knee by Dr. Gladstone Lighter.  4.  Status post left kidney stone removal in 1985.  5.  Bilateral cataract surgery.  6.  Status post TURP in 2000.   ALLERGIES:  CODEINE, PHENOBARBITAL.   CURRENT MEDICATIONS:  1.  Proventil nebulizer q.6h.  2.  Aspirin 81 mg daily.  3.  Dulcolax daily.  4.  Zinacef 1.5 g q.12h.  5.  Zetia 10 mg q.h.s.  6.  Neurontin 300 mg daily.  7.  Amaryl 2 mg daily.  8.  Hydrochlorothiazide 12.5 mg daily.  9.  NovoLog as directed.  10. Prinivil 40 mg daily.  11. Protonix 40 mg daily.  12. Actos 45 mg daily.  13. Mysoline 60 mg daily.  14. Darvocet, Percocet, Reglan and morphine sulfate p.r.n.   REVIEW OF SYSTEMS:  Remarkable for fatigue and some dyspnea on exertion,  chronic productive cough, and postoperative pain at the incisional area, but  there are no other areas of pain.  The rest of the review of systems is  negative.   FAMILY HISTORY:  Mother died with ulcerative colitis, colon cancer in his  grandfather, father died with MI, one daughter died recently at age 8 of  ovarian cancer.   SOCIAL HISTORY:  The patient is married.  He has one remaining biological  child in good health, has one son adopted.  He is a retired Solicitor.  He smoked tobacco for at least 51 years, up to 1-1/2 packs a day.  No alcohol history.  Lives in [place].  Catholic.   PHYSICAL EXAMINATION:  GENERAL:  This is a 70 year old white male in no  acute distress, alert and oriented x3.  VITAL SIGNS:  Blood pressure 138/66, pulse 88, respirations 20, temperature   97.6, pulse oximetry 97% in 1 L.  Weight 248 pounds, height 5 feet 9 inch.  HEENT:  Normocephalic, atraumatic.  PERRLA.  Oral mucosa without thrush or  lesion.  NECK:  Supple, no cervical or supraclavicular masses.  LUNGS:  Remarkable for minor wheezing, some rhonchi bilaterally, as well as  atelectatic sounds.  He still is having chest tubes in place, no rales.  No  axillary masses.  CARDIOVASCULAR:  Regular rate and rhythm without murmurs, rubs or gallops.  ABDOMEN:  Obese, nontender, bowel sounds x4, no palpable spleen or liver.  GENITOURINARY AND RECTAL:  Deferred.  EXTREMITIES:  No clubbing or cyanosis, no edema.  SKIN:  Revealing an incision in the left lower portion of the posterior  chest, which is clean and dry, showing good healing progress.  No petechial  rash.  NEUROLOGIC:  Nonfocal.   LABORATORY DATA:  Hemoglobin 11.8, hematocrit 34.5, white count 11,  platelets 185, neutrophils not available, MCV 83.  PT 12.7, PTT 24, INR 0.9.  Sodium 133, potassium 3.7, BUN 11, creatinine 0.8, glucose 172, total  bilirubin 0.7, alkaline phosphatase 40, AST 27, ALT 30, total protein 5.7,  albumin 3.1, calcium 8.7.   RADIOLOGIC STUDIES:  The latest CT of the chest on January 16, 2006, showed  increasing left lower lobe mass, with no lymphadenopathy.  PET scan on  December 26, 2005, shows the FDG uptake of the left lower lobe mass of 1.2  cm, at 5.2 SUV.  The right paratracheal lymph node was 2.3 x 0.8 cm, with a  maximum SUV of 2.5.  MRI of the brain on January 16, 2006, shows a  subcentimeter right frontal lobe lesion, suspicious for meningioma,  otherwise negative for metastasis.   ASSESSMENT AND PLAN:  Dr. Julien Nordmann has seen and evaluated the patient and the  chart has been reviewed.  This is a pleasant 70 year old white male  diagnosed with stage I-A, T1, N0, Mx, nonsmall-cell lung carcinoma, adenocarcinoma with bronchoalveolar features.  The patient is recovering  well after he underwent  left lower wedge resection with seed implants.  There is no real survival benefit for adjuvant chemotherapy for stage I-A.  Recommend continuous close follow-up with repeat CT of the  chest every six  months.  Will arrange a follow-up appointment for him at the regional cancer  center in one month.   Thank you very much for allowing [place] the opportunity to participate in this  nice man's care.      Sharene Butters, P.A.      [name], MD  Electronically Signed    SW/MEDQ  D:  02/07/2006  T:  02/08/2006  Job:  [id number]   cc:   Ludwig Lean. Doreatha Lew, M.D.  Fax: [phone]   Frann Rider, M.D.  Fax: [phone]   Synthia Innocent, M.D.  Fax: [phone]

## 2011-03-30 NOTE — H&P (Signed)
NAME:  AVID, GUILLETTE NO.:  [id number]   MEDICAL RECORD NO.:  [id number]          PATIENT TYPE:  INP   LOCATION:  NA                           FACILITY:  [facility]   PHYSICIAN:  Nicanor Alcon, M.D. DATE OF BIRTH:  30-Dec-1940   DATE OF ADMISSION:  02/03/2006  DATE OF DISCHARGE:                                HISTORY & PHYSICAL   CHIEF COMPLAINT:  Left lower lobe lung mass.   HISTORY OF PRESENT ILLNESS:  This 70 year old Caucasian male has multiple  medical problems including he smoked 1/2 pack a day and is trying to quit.  He was found to have a questionable lesion on the left lower lobe.  CT scan  showed a 1.2 irregular lesion on the left lower lobe.  A PET  was done that  showed positive at 5.2 on this area.  There was a right peritracheal node  that only had an SUV of 2.1.  He had no hemoptysis or excessive sputum.  He  has had a nonproductive cough, no fever or chills.   PAST MEDICAL HISTORY:  Significant for:  1.  Hypertension.  2.  Obesity.  3.  Diabetes.  4.  Hypercholesterolemia.  5.  Emphysema.  6.  Polyneuropathy.  7.  Essential tremors.  8.  He had a myocardial infarction in 1989.  9.  Pulmonary function tests showed an FVC of 3.74, FEV1 of 2.28.   MEDICATIONS:  1.  Albuterol 2 puffs as needed.  2.  Flonase 2 sprays as needed.  3.  Allegra 180 mg a day.  4.  Metformin 1000 mg twice daily.  5.  Actos 45 mg every morning.  6.  Amaryl 2 mg every morning.  7.  __________ 1 tablet before bed.  8.  Reglan 10 mg 3 times a day.  9.  Zegerid 40 mg a day.  10. Lisinopril 40 mg in the morning.  11. Hydrochlorothiazide 12.5 mg daily.  12. Neurontin 300 mg 3 tablets at night and 1 tablet in the morning.  13. Primidone 50 mg 2 tablets at night and 1 in the morning.  14. Chantix 1 mg twice a day.  15. Aspirin 81 mg.   ALLERGIES:  CODEINE and PHENOBARBITAL.   FAMILY HISTORY:  Positive in that his mother had ulcerative colitis.  His  daughter died of  ovarian cancer at age 76.   SOCIAL HISTORY:  He is married, has three children, retired.  He smoked 1/2  pack a day.  Does not drink alcohol on a regular basis.   REVIEW OF SYSTEMS:  He is 248 pounds.  He is 5 feet 9 inches.  CARDIAC: He  has had some angina.  He has had some chest pain and tightness and was  evaluated by Dr. Doreatha Lew, and he had a negative Cardiolite stress test.  PULMONARY: He has a productive cough and has been treated for asthma and  wheezing. No hemoptysis or bronchitis.  GI: He has a hiatal hernia and  reflux but no dysphagia, constipation, or diarrhea.  GU: No dysuria,  frequent urination, or kidney disease. VASCULAR:  He has peripheral  neuropathy but not TIAs, DVT, or claudication. NEUROLOGIC: No headaches,  blackouts, or seizures.  MUSCULOSKELETAL:  Chronic joint pain but denies  muscular pain. PSYCHIATRIC: No psychiatric illnesses.  EYE, ENT: No change  in eyesight. He says he has had decrease in hearing recently.  HEMATOLOGIC:  No anemia or clotting disorders.   PHYSICAL EXAMINATION:  GENERAL: Obese Caucasian male in no acute distress.  VITAL SIGNS: Blood pressure 144/70, pulse 70, respirations 18, O2 saturation  96%.  HEAD:  Atraumatic.  EYES: Pupils equal, round, and reactive to light and accommodation.  Extraocular movements are normal.  EARS: Tympanic membranes are intact.  NOSE: There is no septal deviation.  THROAT:  Without lesion.  NECK: Supple.  There is no thyromegaly or carotid bruits.  ADENOPATHY:  No supraclavicular or axillary adenopathy.  CHEST: Clear to auscultation and percussion.  HEART:  Regular sinus rhythm with no murmurs.  ABDOMEN:  Obese.  There is no hepatosplenomegaly.  EXTREMITIES:  Pulses 2+.  There is no clubbing or edema.  NEUROLOGIC: He is oriented x3.  Cranial nerves II-XII intact.  Sensory and  motor intact.  SKIN: Without lesion.   IMPRESSION:  1.  Left lower lobe mass, rule out cancer.  2.  Diabetes mellitus.  3.   Hypertension.  4.  Hypercholesterolemia.  5.  Chronic obstructive pulmonary disease.  6.  Polyneuropathy.  7.  Essential tremors.  8.  History of coronary artery disease.   PLAN:  Left VATS wedge resection of left lower lobe lesion with node  sampling and seed implantation.           ______________________________  Nicanor Alcon, M.D.     DPB/MEDQ  D:  02/03/2006  T:  02/04/2006  Job:  [id number]

## 2011-03-30 NOTE — Op Note (Signed)
   NAME:  Paul [name], Paul [name]                       ACCOUNT NO.:  [id number]   MEDICAL RECORD NO.:  [id number]                   PATIENT TYPE:  AMB   LOCATION:  ENDO                                 FACILITY:  [facility]   PHYSICIAN:  James L. Rolla Flatten., M.D.          DATE OF BIRTH:  Mar 16, 1941   DATE OF PROCEDURE:  07/30/2002  DATE OF DISCHARGE:                                 OPERATIVE REPORT   PROCEDURE:  Colonoscopy.   MEDICATIONS:  Fentanyl 100 mcg, Versed 10 mg IV.   ENDOSCOPE:  Adult Olympus video colonoscope.   INDICATIONS:  Rectal bleeding with a strong family history of colon cancer.   DESCRIPTION OF PROCEDURE:  The procedure had been explained to the patient  and consent obtained.  With the patient in the left lateral decubitus  position, the Olympus scope was inserted and advanced under direct  visualization.  We were able to reach the cecum without difficulty with the  ileocecal valve and appendiceal orifice seen.  Scope withdrawn and cecum,  ascending colon, hepatic flexure, transverse colon, splenic flexure,  descending and sigmoid all seen well.  At approximately 30 cm from the anal  verge was a 0.5 cm AVM that was not actively bleeding.  No polyps were seen  in the rectum.  Fairly large internal hemorrhoids were seen in the anal  canal.  The scope was withdrawn.  The patient tolerated the procedure well,  was maintained on low-flow oxygen and pulse oximetry throughout the  procedure.   ASSESSMENT:  1. Sigmoid colon arteriovenous malformation, possibly the source of the     bleeding.  2. Moderate to large internal hemorrhoids, also possibly the source of the     bleeding.   PLAN:  Will follow clinically, give hemorrhoid sheet.  If the patient  bleeds, will consider ERBE argon plasma coagulator treatment.  Otherwise we  will repeat in five years.  Will see back in the office in one to two  months.                                                James L. Rolla Flatten., M.D.    Jaynie Bream  D:  07/30/2002  T:  07/31/2002  Job:  [id number]   cc:   Frann Rider, M.D.  [place]  [place] [address]  Fax: [phone]

## 2011-03-30 NOTE — Op Note (Signed)
NAME:  [name], [name] NO.:  [id number]   MEDICAL RECORD NO.:  [id number]          PATIENT TYPE:  AMB   LOCATION:  DAY                          [facility]   PHYSICIAN:  Shellia Carwin, M.D. DATE OF BIRTH:  [birth year]   DATE OF PROCEDURE:  01/31/2005  DATE OF DISCHARGE:                                 OPERATIVE REPORT   OPERATIVE PROCEDURE:  Laparoscopic cholecystectomy with intraoperative  cholangiogram.   SURGEON:  Shellia Carwin, M.D.   ASSISTANT:  Darrelyn Hillock, M.D.   ANESTHESIA:  General endotracheal.   PREOPERATIVE DIAGNOSIS:  Gallstones.   POSTOPERATIVE DIAGNOSES:  1.  Gallstones.  2.  Normal cholangiogram.   CLINICAL SUMMARY:  A 70 year old male with bouts of abdominal pain and a CT  scan showing gallstones.  An ultrasound did not show them, but they were  definitely there on CT and we did not repeat any other studies.  His liver  function studies were normal.  He has several medical problems, including  obesity and diabetes, and we elected to treat him electively before he got  into significant trouble.   OPERATIVE FINDINGS:  His gallbladder was thin walled.  The cholangiogram  appeared normal.  The duct and artery were normal in anatomy.   DESCRIPTION OF PROCEDURE:  Having received 1.0 g Ancef preoperatively, the  patient was positioned, prepped and draped in a standard fashion.  A total  of 25 mL of 0.5% Marcaine was infiltrated at the operative sites for skin  incisions for postoperative analgesia.  Then a midline incision was made  below the umbilicus and carried down into the peritoneal cavity in the  midline.  Controlled with a figure-of-eight 0 Vicryl and an operating Hassan  port inserted and good CO2 pneumoperitoneum established.  Then camera placed  and with good visualization to two #5 ports were placed laterally and a  second #10 medially.  The lateral port graspers gave excellent exposure and  we took adhesions  to the gallbladder down using a combination of cautery and  scissors.  Then the cystic duct and artery were each identified and  dissected circumferentially.  When certain of the anatomy, a single clip  placed on the cystic duct and an incision made in it.  A percutaneous  catheter passed into the duct and a cholangiogram obtained with normal-  appearing findings, no obstruction and no defects.  Then the catheter was  withdrawn and multiple clips placed distally on the duct and it was  transected.  The artery was circumferentially dissected and controlled with  multiple clips and divided.  The gallbladder was then removed from the liver  bed from below upwards using coagulation for hemostasis and dissection.  At  the superior portion, a portion of the liver capsule was torn removing the  gallbladder and this was easily controlled with cautery.   The gallbladder was then placed in an EndoCatch bag and the camera moved to  the upper port.  The gallbladder was then removed through the umbilical port  without spillage or complication.  The operative site was checked for  hemostasis, lavaged with saline and suctioned dry.  Then CO2 was released  and ports removed.  The midline was  closed with the previous figure-of-eight, as well as two interrupted 0  Vicryl sutures, subcutaneous approximated with 4-0 Vicryl and then Steri-  Strips to all incisions.  There were no complications and the sponge and  needle counts were correct.      MRL/MEDQ  D:  01/31/2005  T:  01/31/2005  Job:  [id number]   cc:   Frann Rider, M.D.  [place]  [place] [address]  Fax: [phone]

## 2011-03-30 NOTE — Op Note (Signed)
NAME:  Paul [name], Paul [name]             ACCOUNT NO.:  [id number]   MEDICAL RECORD NO.:  [id number]          PATIENT TYPE:  AMB   LOCATION:  ENDO                         FACILITY:  [facility]   PHYSICIAN:  James L. Rolla Flatten., M.D.DATE OF BIRTH:  [birth year]   DATE OF PROCEDURE:  09/26/2005  DATE OF DISCHARGE:                                 OPERATIVE REPORT   PROCEDURE:  Sigmoidoscopy with biopsy.   MEDICATIONS:  None.   INDICATIONS FOR PROCEDURE:  Persistent diarrhea.  This has been refractory  to other medications, specifically Flagyl and __________.  Patient has  continued to lose weight, continued to have abdominal pain.  He did have  some reaction to the Donnatal so this was stopped.   DESCRIPTION OF PROCEDURE:  The procedure was done in the unprepped state.  A  flexible sigmoidoscope was put in.  The patient was not sedated,was awake  during the procedure.  We advanced easily up to 50 cm.  There was semisolid  stool in the colon and liquid stool with consistency of thin grits.  This  was sucked through and sent to the lab for white blood cell smear, ova and  parasites.  Multiple random biopsies were taken and the mucosa did look a  bit edematous.  Scope was withdrawn.  The patient tolerated the procedure  well.   ASSESSMENT:  Diarrhea of unclear cause.  Need to rule out microscopic  colitis.  Will check path results and see back in my office in two weeks.  Will send the stool for ova and parasite culture.  Will start him  empirically on Levbid one half tablet to one tablet b.i.d.           ______________________________  Joyice Faster. Rolla Flatten., M.D.     Jaynie Bream  D:  09/26/2005  T:  09/26/2005  Job:  [id number]   cc:   Frann Rider, M.D.  Fax: [phone]

## 2011-03-30 NOTE — Discharge Summary (Signed)
NAME:  Paul [name], Paul [name] NO.:  [id number]   MEDICAL RECORD NO.:  [id number]          PATIENT TYPE:  INP   LOCATION:  2021                         FACILITY:  [facility]   PHYSICIAN:  Nicanor Alcon, M.D. DATE OF BIRTH:  [birth year]   DATE OF ADMISSION:  02/05/2006  DATE OF DISCHARGE:  02/10/2006                                 DISCHARGE SUMMARY   HISTORY OF PRESENT ILLNESS:  The patient is a 70 year old white male with  multiple medical problems who was referred to Dr. Arlyce Dice due the finding of  a questionable lesion in the left lower lobe of his lung.  A CT scan  revealed a 1.2-cm irregular lesion on the left lower lobe.  A PET scan was  done, and this revealed an SUV of 5.2 in this area.  The patient has a known  history of long-term tobacco abuse.  He denied hemoptysis or excessive  sputum.  He does have a nonproductive cough but no fever, chills, or other  constitutional symptoms.  It was Dr. Lorelei Pont recommendation to proceed with  left video-assisted thoracoscopy with wedge resection of the left lower lobe  and node sampling as well as radioactive seed implantation.  He was admitted  this hospitalization for the procedure.   PAST MEDICAL HISTORY:  1.  Hypertension.  2.  Obesity.  3.  Diabetes.  4.  Hypercholesterolemia.  5.  Emphysema.  6.  Polyneuropathy.  7.  Essential tremors.  8.  Remote myocardial infarction in 1989.   MEDICATIONS PRIOR TO ADMISSION:  1.  Hydrochlorothiazide 12.5 mg q.a.m.  2.  Gabapentin 800 mg q.h.s.  3.  Gabapentin 300 mg q.a.m.  4.  Primidone 50 mg at bedtime and one q.a.m.  5.  Chantix 1 mg b.i.d.  6.  Aspirin 81 mg daily.  7.  Centrum Silver.  8.  Multivitamin one daily.  9.  Albuterol metered dose inhaler 90 mcg, two puffs p.r.n.  10. Flonase 0.5%, two sprays p.r.n.  11. Allegra 180 mg daily.  12. Metformin 1000 mg b.i.d.  13. Actos 45 mg q.a.m.  14. Amaryl 2 mg q.a.m.  15. Zetia 10 mg q.h.s.  16. Zegerid 40 mg q.h.s.  17. Metoclopramide 10 mg t.i.d.  18. Lisinopril 40 mg daily.   ALLERGIES:  CODEINE AND PHENOBARBITAL.   FAMILY HISTORY:  Please see the history and physical done at the time of  admission.   SOCIAL HISTORY:  Please see the history and physical done at the time of  admission.   REVIEW OF SYSTEMS:  Please see the history and physical done at the time of  admission.   PHYSICAL EXAMINATION:  Please see the history and physical done at the time  of admission.   HOSPITAL COURSE:  The patient was admitted electively and on February 05, 2006  was taken to the operating room where he underwent the following procedure:  Left video-assisted thoracoscopy with wedge resection of the left lower lobe  and lymph node sampling with radioactive seed implantation.  The patient  tolerated the procedure well and was taken to the postanesthesia care unit  in stable condition.   Postoperatively, the patient has done well.  All routine lines, monitors,  and drainage devices have been discontinued in the sterile fashion in a  stepwise manner.  His chest tubes have been discontinued following  monitoring of daily chest x-rays and output.  His x-ray does reveal some  moderate atelectasis and pulmonary opacification in his lower lobes, but  these are stable and improving with time.  Oxygen is being weaned, and he is  managing a greater increase in exercise tolerance over time.  His incision  is healing well, without evidence of infection.  His overall status is felt  tentatively stable for discharge in the morning of February 09, 2006 pending  morning round reevaluation.   DISCHARGE MEDICATIONS:  1.  As preoperatively.  2.  Additionally for pain, Tylox one or two q.6h. p.r.n. as needed.   DISCHARGE INSTRUCTIONS:  The patient has received written instructions  regarding medications, activity, diet, wound care, and followup.   FOLLOW UP:  The patient is to follow up with Dr. Arlyce Dice on February 13, 2006 at  4:20  with a chest x-ray from [facility].   FINAL DIAGNOSIS:  Left lower lobe lung mass, now status post resection.  Pathology reveals adenocarcinoma.  Note, the patient has been seen in  hematology/oncology consultation by Dr. Julien Nordmann.  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  He is not recommended at this time to have adjuvant therapy but will be  followed closely with serial computed tomography scans every six months.   SECONDARY DIAGNOSES:  As listed preoperatively in the history.      John Giovanni, P.A.-C.    ______________________________  Nicanor Alcon, M.D.    Loren Racer  D:  02/08/2006  T:  02/10/2006  Job:  [id number]   cc:   Nicanor Alcon, M.D.  [address]  [place]  [place] [address]   Frann Rider, M.D.  Fax: [phone]   Sheral Apley. Tammi Klippel, M.D.  Fax: [phone]

## 2011-03-30 NOTE — Op Note (Signed)
NAME:  Paul [name], Paul [name]                       ACCOUNT NO.:  [id number]   MEDICAL RECORD NO.:  [id number]                   PATIENT TYPE:  AMB   LOCATION:  DAY                                  FACILITY:  [facility]   PHYSICIAN:  Kipp Brood. Gladstone Lighter, M.D.             DATE OF BIRTH:  1941/10/28   DATE OF PROCEDURE:  12/06/2003  DATE OF DISCHARGE:                                 OPERATIVE REPORT   SURGEON:  Kipp Brood. Gladstone Lighter, M.D.   ASSISTANT:  Nurse.   PREOPERATIVE DIAGNOSES:  1. Degenerative arthritis, left knee.  2. Torn medial meniscus posterior horn, left knee.   POSTOPERATIVE DIAGNOSES:  1. Degenerative arthritis, left knee.  2. Torn medial meniscus posterior horn, left knee.   OPERATION:  1. Diagnostic arthroscopy, left knee.  2. Medial meniscectomy of the posterior horn, left knee.  3. Synovectomy, suprapatellar pouch, left knee.  4. Abrasion chondroplasty of patella, left knee.  5. Abrasion chondroplasty of the medial femoral condyle, left knee.   DESCRIPTION OF PROCEDURE:  Under local anesthesia with standby, a routine  orthopedic prep and draping of the left knee was carried out.  At this time,  a small punctate incision made in the suprapatellar pouch.  Inflow cannula  was inserted.  The knee was distended with saline.  Another small punctate  incision made in the anterolateral joint, and the arthroscope was entered.  A complete diagnostic arthroscopy was carried out.  He had rather  significant synovitis of the suprapatellar pouch as well as chondroplasia of  the patella.  I introduced a shaver suction device and did a partial  abrasion chondroplasty of the patella as well as synovectomy of the  suprapatellar pouch.  I then went down and examined the cruciates, and they  were intact.  The medial and lateral joint space were intact except for some  mild arthritic changes.  The medial joint space was the main problem.  He  had a rather significant degenerative tear of the  medial meniscus.  I  introduced a shaver suction device and did a partial medial meniscectomy.  Following this, I did an abrasion chondroplasty of the medial femoral  condyle as well and thoroughly cleaned out the knee joint.  I thoroughly  irrigated out the knee, removed all the fluid, and I closed all 4 punctate  incisions with 3-0 nylon suture.  I injected 30 mL of 0.5% Marcaine with  epinephrine into the knee joint at the end of the procedure, and a sterile  Neosporin bundle dressing was applied.  He had 1 g of IV Ancef preop.  During surgery, he had 30 mg of IV Toradol.                                               Ronald A. Gioffre,  M.D.    RAG/MEDQ  D:  12/06/2003  T:  12/06/2003  Job:  [id number]

## 2011-03-30 NOTE — Op Note (Signed)
NAME:  Paul [name], REAGLE NO.:  [id number]   MEDICAL RECORD NO.:  [id number]          PATIENT TYPE:  INP   LOCATION:  3308                         FACILITY:  [facility]   PHYSICIAN:  Nicanor Alcon, M.D. DATE OF BIRTH:  July 27, 1941   DATE OF PROCEDURE:  02/05/2006  DATE OF DISCHARGE:                                 OPERATIVE REPORT   PREOPERATIVE DIAGNOSIS:  Left lower lobe mass.   POSTOPERATIVE DIAGNOSIS:  Left lower lobe mass, adenocarcinoma left lower  lobe.   OPERATION:  Left VATS, wedge resection left lower lobe with node sampling  and seed implantation.   SURGEON:  Nicanor Alcon, M.D.   ANESTHESIA:  General anesthesia.   After percutaneous insertion of all monitoring lines, the patient underwent  general anesthesia, was turned in the left lateral thoracotomy position and  was prepped and draped in the usual sterile manner.  Two trocar sites were  made in the anterior and posterior axillary line at the seventh intercostal  space.  Two trocars were inserted.  Exploration was carried out.  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  The left lower lobe was stuck to the  aorta with marked adhesions.  For this reason, the lesion could not be seen  and an anterolateral thoracotomy incision was made at the sixth intercostal  space with the latissimus being partially divided and the serratus split and  the sixth intercostal space was entered.  The adhesions were taken down with  electrocautery and sharp dissection off the aorta. There were several small  little nodules in this area and two were biopsied and sent for frozen  section which revealed a fibrotic nodule.  Lesion was in the inferior  portion of the superior segment of the left lower lobe.  The inferior  pulmonary ligament was taken down with electrocautery and 9L node was  dissected free and then superior to the inferior pulmonary vein and a 11L  node was dissected out.  These were sent for permanent  sections. The lesion  was identified and a 2 cm margin was made below the lesion and then it was  resected with four applications of the Echelon 60 stapler.  The wedge  resection was sent for examinations of adenocarcinoma.  While the seeds were  being prepared, the radioactive iodine seeds were being prepared by Dr.  Tammi Klippel, we went ahead and put in an On-cue catheter in the usual fashion in  the subpleural space and then a Marcaine block.  We then put in two chest  tubes through the trocar sites, one in the anterior and one posterior and  sutured in place with 0 silk.  A CoSeal was placed in the suture line and  then we placed three sutures on the suture line, one superior, one inferior,  one in the middle to suture the radioactive iodine mesh on.  These were  sutured to the mesh.  Because it was close to the aorta, a Gelfoam patch was  sutured on the mesh to keep it away from the aorta as far as the radiation.  Then two  lateral sutures were  placed with 3-0 Vicryl and the area was clipped so that it would allow for  expansion of the lung.  The chest was then closed with three pericostals, #1  Vicryl in the muscle layer, 2-0 Vicryl in the subcutaneous tissue and  Ethicon skin clips.  Patient returned to the recovery room in stable  condition.           ______________________________  Nicanor Alcon, M.D.     DPB/MEDQ  D:  02/05/2006  T:  02/06/2006  Job:  [id number]   cc:   Ludwig Lean. Doreatha Lew, M.D.  Fax: [phone]

## 2011-04-17 ENCOUNTER — Encounter (HOSPITAL_COMMUNITY): Payer: Self-pay

## 2011-04-17 ENCOUNTER — Encounter (HOSPITAL_COMMUNITY)
Admission: RE | Admit: 2011-04-17 | Discharge: 2011-04-17 | Disposition: A | Discharge status: Home / Self Care | Payer: Medicare Other | Source: Ambulatory Visit | Attending: Gastroenterology | Admitting: Gastroenterology

## 2011-04-17 DIAGNOSIS — E119 Type 2 diabetes mellitus without complications: Secondary | ICD-10-CM | POA: Insufficient documentation

## 2011-04-17 DIAGNOSIS — R6881 Early satiety: Secondary | ICD-10-CM | POA: Insufficient documentation

## 2011-04-17 DIAGNOSIS — R142 Eructation: Secondary | ICD-10-CM | POA: Insufficient documentation

## 2011-04-17 DIAGNOSIS — R112 Nausea with vomiting, unspecified: Secondary | ICD-10-CM

## 2011-04-17 DIAGNOSIS — R109 Unspecified abdominal pain: Secondary | ICD-10-CM | POA: Insufficient documentation

## 2011-04-17 DIAGNOSIS — R141 Gas pain: Secondary | ICD-10-CM | POA: Insufficient documentation

## 2011-04-17 MED ORDER — TECHNETIUM TC 99M SULFUR COLLOID
2.2000 | Freq: Once | INTRAVENOUS | Status: AC | PRN
  Administered 2011-04-17: 2.2 via ORAL

## 2011-06-04 ENCOUNTER — Encounter: Payer: Self-pay | Admitting: Podiatry

## 2011-07-04 ENCOUNTER — Other Ambulatory Visit: Payer: Self-pay | Admitting: Gastroenterology

## 2011-07-12 ENCOUNTER — Other Ambulatory Visit: Payer: Self-pay | Admitting: Gastroenterology

## 2011-07-12 DIAGNOSIS — R197 Diarrhea, unspecified: Secondary | ICD-10-CM

## 2011-07-20 ENCOUNTER — Ambulatory Visit
Admission: RE | Admit: 2011-07-20 | Discharge: 2011-07-20 | Disposition: A | Discharge status: Home / Self Care | Payer: Medicare Other | Source: Ambulatory Visit | Attending: Gastroenterology | Admitting: Gastroenterology

## 2011-07-20 DIAGNOSIS — R197 Diarrhea, unspecified: Secondary | ICD-10-CM

## 2011-09-11 ENCOUNTER — Emergency Department (HOSPITAL_COMMUNITY)
Admission: EM | Admit: 2011-09-11 | Discharge: 2011-09-11 | Disposition: A | Discharge status: Home / Self Care | Payer: Medicare Other | Attending: Emergency Medicine | Admitting: Emergency Medicine

## 2011-09-11 ENCOUNTER — Emergency Department (HOSPITAL_COMMUNITY): Payer: Medicare Other

## 2011-09-11 DIAGNOSIS — M25569 Pain in unspecified knee: Secondary | ICD-10-CM | POA: Insufficient documentation

## 2011-09-11 DIAGNOSIS — E119 Type 2 diabetes mellitus without complications: Secondary | ICD-10-CM | POA: Insufficient documentation

## 2011-09-11 DIAGNOSIS — I10 Essential (primary) hypertension: Secondary | ICD-10-CM | POA: Insufficient documentation

## 2011-09-11 DIAGNOSIS — M25469 Effusion, unspecified knee: Secondary | ICD-10-CM | POA: Insufficient documentation

## 2011-09-11 DIAGNOSIS — W101XXA Fall (on)(from) sidewalk curb, initial encounter: Secondary | ICD-10-CM | POA: Insufficient documentation

## 2011-09-14 ENCOUNTER — Other Ambulatory Visit: Payer: Self-pay | Admitting: Internal Medicine

## 2011-09-14 ENCOUNTER — Encounter (HOSPITAL_BASED_OUTPATIENT_CLINIC_OR_DEPARTMENT_OTHER): Payer: Medicare Other | Admitting: Internal Medicine

## 2011-09-14 DIAGNOSIS — I10 Essential (primary) hypertension: Secondary | ICD-10-CM

## 2011-09-14 DIAGNOSIS — R7309 Other abnormal glucose: Secondary | ICD-10-CM

## 2011-09-14 DIAGNOSIS — C3430 Malignant neoplasm of lower lobe, unspecified bronchus or lung: Secondary | ICD-10-CM

## 2011-09-14 DIAGNOSIS — Z902 Acquired absence of lung [part of]: Secondary | ICD-10-CM

## 2011-09-14 LAB — CBC WITH DIFFERENTIAL/PLATELET
BASO%: 0.3 % (ref 0.0–2.0)
Basophils Absolute: 0 10*3/uL (ref 0.0–0.1)
EOS%: 2.6 % (ref 0.0–7.0)
Eosinophils Absolute: 0.2 10*3/uL (ref 0.0–0.5)
HCT: 40.9 % (ref 38.4–49.9)
HGB: 13.8 g/dL (ref 13.0–17.1)
LYMPH%: 19.2 % (ref 14.0–49.0)
MCH: 27.9 pg (ref 27.2–33.4)
MCHC: 33.7 g/dL (ref 32.0–36.0)
MCV: 82.6 fL (ref 79.3–98.0)
MONO#: 0.8 10*3/uL (ref 0.1–0.9)
MONO%: 8.2 % (ref 0.0–14.0)
NEUT#: 6.8 10*3/uL — ABNORMAL HIGH (ref 1.5–6.5)
NEUT%: 69.7 % (ref 39.0–75.0)
Platelets: 254 10*3/uL (ref 140–400)
RBC: 4.95 10*6/uL (ref 4.20–5.82)
RDW: 15.1 % — ABNORMAL HIGH (ref 11.0–14.6)
WBC: 9.8 10*3/uL (ref 4.0–10.3)
lymph#: 1.9 10*3/uL (ref 0.9–3.3)

## 2011-09-14 LAB — COMPREHENSIVE METABOLIC PANEL
ALT: 21 U/L (ref 0–53)
AST: 16 U/L (ref 0–37)
Albumin: 4.1 g/dL (ref 3.5–5.2)
Alkaline Phosphatase: 48 U/L (ref 39–117)
BUN: 26 mg/dL — ABNORMAL HIGH (ref 6–23)
CO2: 20 mEq/L (ref 19–32)
Calcium: 9.8 mg/dL (ref 8.4–10.5)
Chloride: 105 mEq/L (ref 96–112)
Creatinine, Ser: 1.02 mg/dL (ref 0.50–1.35)
Glucose, Bld: 125 mg/dL — ABNORMAL HIGH (ref 70–99)
Potassium: 4.2 mEq/L (ref 3.5–5.3)
Sodium: 136 mEq/L (ref 135–145)
Total Bilirubin: 0.3 mg/dL (ref 0.3–1.2)
Total Protein: 6.4 g/dL (ref 6.0–8.3)

## 2011-09-17 ENCOUNTER — Ambulatory Visit (HOSPITAL_COMMUNITY)
Admission: RE | Admit: 2011-09-17 | Discharge: 2011-09-17 | Disposition: A | Discharge status: Home / Self Care | Payer: Medicare Other | Source: Ambulatory Visit | Attending: Internal Medicine | Admitting: Internal Medicine

## 2011-09-17 DIAGNOSIS — J449 Chronic obstructive pulmonary disease, unspecified: Secondary | ICD-10-CM | POA: Insufficient documentation

## 2011-09-17 DIAGNOSIS — R911 Solitary pulmonary nodule: Secondary | ICD-10-CM | POA: Insufficient documentation

## 2011-09-17 DIAGNOSIS — R05 Cough: Secondary | ICD-10-CM | POA: Insufficient documentation

## 2011-09-17 DIAGNOSIS — C3490 Malignant neoplasm of unspecified part of unspecified bronchus or lung: Secondary | ICD-10-CM | POA: Insufficient documentation

## 2011-09-17 DIAGNOSIS — R0602 Shortness of breath: Secondary | ICD-10-CM | POA: Insufficient documentation

## 2011-09-17 DIAGNOSIS — J4489 Other specified chronic obstructive pulmonary disease: Secondary | ICD-10-CM | POA: Insufficient documentation

## 2011-09-17 DIAGNOSIS — R059 Cough, unspecified: Secondary | ICD-10-CM | POA: Insufficient documentation

## 2011-09-17 MED ORDER — IOHEXOL 300 MG/ML  SOLN
80.0000 mL | Freq: Once | INTRAMUSCULAR | Status: AC | PRN
  Administered 2011-09-17: 80 mL via INTRAVENOUS

## 2011-09-27 ENCOUNTER — Other Ambulatory Visit: Payer: Self-pay | Admitting: *Deleted

## 2011-09-27 ENCOUNTER — Telehealth: Payer: Self-pay | Admitting: *Deleted

## 2011-09-27 NOTE — Progress Notes (Signed)
Per Dr Vista Mink, pt needs f/u appt, onc tx schedule form filled out for f/u after CT scan 12/5.  SLJ

## 2011-10-11 ENCOUNTER — Telehealth: Payer: Self-pay | Admitting: Internal Medicine

## 2011-10-11 ENCOUNTER — Ambulatory Visit (HOSPITAL_BASED_OUTPATIENT_CLINIC_OR_DEPARTMENT_OTHER): Payer: Medicare Other | Admitting: Internal Medicine

## 2011-10-11 VITALS — BP 136/73 | HR 84 | Temp 97.2°F | Ht 68.0 in | Wt 247.6 lb

## 2011-10-11 DIAGNOSIS — R911 Solitary pulmonary nodule: Secondary | ICD-10-CM

## 2011-10-11 DIAGNOSIS — C3490 Malignant neoplasm of unspecified part of unspecified bronchus or lung: Secondary | ICD-10-CM | POA: Insufficient documentation

## 2011-10-11 DIAGNOSIS — C3430 Malignant neoplasm of lower lobe, unspecified bronchus or lung: Secondary | ICD-10-CM

## 2011-10-11 NOTE — Progress Notes (Signed)
[facility] OFFICE PROGRESS NOTE  DIAGNOSIS: Stage IA (T1, N0, MX) non-small cell lung cancer, adenocarcinoma with bronchoalveolar features diagnosed in 01/2006.  PRIOR THERAPY: Status post wedge resection of the left lower lobe with node sampling and seed implants under the care of Dr. Arlyce Dice and Dr. Tammi Klippel on 02/05/2006.  CURRENT THERAPY: Observation.   INTERVAL HISTORY: Paul [name] 70 y.o. male returns to the clinic today for followup visit. The patient has no complaints today he is feeling fine. Over the last 2 months he has been complaining of mild cough and chest congestion. He has no significant chest pain continues to have the baseline shortness breath increased with exertion. No significant weight loss. He has repeat CT scan of the chest performed on 09/17/2011 and he is here today for evaluation and discussion of his scan results.  MEDICAL HISTORY: Past Medical History  Diagnosis Date  . Diabetes mellitus   . Nausea & vomiting     ALLERGIES:  is allergic to codeine and phenobarbital.  MEDICATIONS:  Current Outpatient Prescriptions  Medication Sig Dispense Refill  . albuterol (PROVENTIL HFA;VENTOLIN HFA) 108 (90 BASE) MCG/ACT inhaler Inhale 2 puffs into the lungs every 6 (six) hours as needed.        . fluticasone (FLONASE) 50 MCG/ACT nasal spray Place 2 sprays into the nose daily.         REVIEW OF SYSTEMS:  A comprehensive review of systems was negative except for: Respiratory: positive for cough and dyspnea on exertion   PHYSICAL EXAMINATION: General appearance: alert, cooperative and no distress Head: Normocephalic, without obvious abnormality, atraumatic Neck: no adenopathy Lymph nodes: Cervical, supraclavicular, and axillary nodes normal. Resp: clear to auscultation bilaterally Cardio: regular rate and rhythm, S1, S2 normal, no murmur, click, rub or gallop GI: soft, non-tender; bowel sounds normal; no masses,  no organomegaly Extremities:  extremities normal, atraumatic, no cyanosis or edema Neurologic: Alert and oriented X 3, normal strength and tone. Normal symmetric reflexes. Normal coordination and gait  ECOG PERFORMANCE STATUS: 1 - Symptomatic but completely ambulatory  Blood pressure 136/73, pulse 84, temperature 97.2 F (36.2 C), height _0  (1.727 m), weight 247 lb 9.6 oz (112.311 kg).  LABORATORY DATA: Lab Results  Component Value Date   WBC 9.8 09/14/2011   HGB 13.8 09/14/2011   HCT 40.9 09/14/2011   MCV 82.6 09/14/2011   PLT 254 09/14/2011      Chemistry      Component Value Date/Time   NA 136 09/14/2011 1329   K 4.2 09/14/2011 1329   CL 105 09/14/2011 1329   CO2 20 09/14/2011 1329   BUN 26* 09/14/2011 1329   CREATININE 1.02 09/14/2011 1329      Component Value Date/Time   CALCIUM 9.8 09/14/2011 1329   ALKPHOS 48 09/14/2011 1329   AST 16 09/14/2011 1329   ALT 21 09/14/2011 1329   BILITOT 0.3 09/14/2011 1329       RADIOGRAPHIC STUDIES: Ct Chest W Contrast  09/17/2011  *RADIOLOGY REPORT*  Clinical Data: Follow-up lung carcinoma.  COPD.  Shortness of breath. Cough.  Prior left lower lobectomy and brachytherapy.  CT CHEST WITH CONTRAST  Technique:  Multidetector CT imaging of the chest was performed following the standard protocol during bolus administration of intravenous contrast.  Contrast: 51m OMNIPAQUE IOHEXOL 300 MG/ML IV SOLN  Comparison: 09/18/2010  Findings: Postop changes from previous left lower lobe resection and brachytherapy are again demonstrated.  Moderate pulmonary emphysema again noted.  No suspicious pulmonary nodules  or masses are identified within the left lung.  In the anterior right upper lobe, there is a ill-defined nodular opacity measuring 6 x 7 mm on image 20 which was not seen on previous exams.  This is nonspecific and could represent inflammatory or infectious process although neoplasm cannot definitely be excluded.  No other suspicious pulmonary nodules or masses are identified.  There is  no evidence of pathologically enlarged hilar or mediastinal lymph nodes.  Shotty less than 1 cm mediastinal lymph nodes remain stable.  No evidence of pleural or pericardial effusion.  Heart size remains normal.  Extensive coronary artery calcification again noted.  5.7 cm mostly fat attenuation right adrenal mass and tiny left adrenal nodule also containing macroscopic fat are again seen, and no change since prior study.  These are consistent with benign adrenal myelolipomas. Hepatic steatosis again noted.  No suspicious bone lesions are identified.  IMPRESSION:  1.  New nonspecific 6 x 7 mm right upper lobe nodular opacity. Follow-up by CT recommended in 3-6 months. 2.  Otherwise stable exam. No evidence of lymphadenopathy or pleural effusion.  Original Report Authenticated By: Marlaine Hind, M.D.   Dg Knee Complete 4 Views Right  09/11/2011  *RADIOLOGY REPORT*  Clinical Data: Right knee pain and abrasion following a fall.  RIGHT KNEE - COMPLETE 4+ VIEW  Comparison: MR dated 07/23/2009.  Findings: Large effusion.  Diffuse osteopenia.  Mild posterior patellar spur formation.  Atheromatous arterial calcifications.  No fracture or dislocation seen.  IMPRESSION:  1.  No fracture. 2.  Large effusion.  Original Report Authenticated By: Gerald Stabs, M.D.    ASSESSMENT: This is a very pleasant 70 years old white male with history of stage IA non-small cell lung cancer diagnosed in March of 2007, status post wedge resection of the left lower lobe with lymph node dissection and seed implants. The patient is feeling fine today but the CT scan showed a questionable new nonspecific 0.6 CM right upper lobe pulmonary nodule that require attention on followup exam. I discussed the scan results with the patient.  PLAN: I would see him back for followup visit in 3 months with repeat CT scan of the chest with contrast  All questions were answered. The patient knows to call the clinic with any problems, questions or  concerns. We can certainly see the patient much sooner if necessary.

## 2011-10-11 NOTE — Telephone Encounter (Signed)
gve the pt his feb 2013 appt calendar along with the ct scan appts

## 2011-10-12 ENCOUNTER — Encounter: Payer: Self-pay | Admitting: Internal Medicine

## 2011-11-27 DIAGNOSIS — M79609 Pain in unspecified limb: Secondary | ICD-10-CM | POA: Diagnosis not present

## 2011-11-27 DIAGNOSIS — B351 Tinea unguium: Secondary | ICD-10-CM | POA: Diagnosis not present

## 2011-12-26 DIAGNOSIS — J40 Bronchitis, not specified as acute or chronic: Secondary | ICD-10-CM | POA: Diagnosis not present

## 2012-01-10 ENCOUNTER — Other Ambulatory Visit: Payer: Self-pay | Admitting: Internal Medicine

## 2012-01-10 ENCOUNTER — Ambulatory Visit (HOSPITAL_COMMUNITY)
Admission: RE | Admit: 2012-01-10 | Discharge: 2012-01-10 | Disposition: A | Discharge status: Home / Self Care | Payer: Medicare Other | Source: Ambulatory Visit | Attending: Internal Medicine | Admitting: Internal Medicine

## 2012-01-10 ENCOUNTER — Other Ambulatory Visit (HOSPITAL_BASED_OUTPATIENT_CLINIC_OR_DEPARTMENT_OTHER): Payer: Medicare Other | Admitting: Lab

## 2012-01-10 DIAGNOSIS — C3490 Malignant neoplasm of unspecified part of unspecified bronchus or lung: Secondary | ICD-10-CM

## 2012-01-10 DIAGNOSIS — D3500 Benign neoplasm of unspecified adrenal gland: Secondary | ICD-10-CM | POA: Diagnosis not present

## 2012-01-10 DIAGNOSIS — I2789 Other specified pulmonary heart diseases: Secondary | ICD-10-CM | POA: Diagnosis not present

## 2012-01-10 DIAGNOSIS — R059 Cough, unspecified: Secondary | ICD-10-CM | POA: Insufficient documentation

## 2012-01-10 DIAGNOSIS — J40 Bronchitis, not specified as acute or chronic: Secondary | ICD-10-CM | POA: Diagnosis not present

## 2012-01-10 DIAGNOSIS — K7689 Other specified diseases of liver: Secondary | ICD-10-CM | POA: Diagnosis not present

## 2012-01-10 DIAGNOSIS — R7309 Other abnormal glucose: Secondary | ICD-10-CM | POA: Diagnosis not present

## 2012-01-10 DIAGNOSIS — R05 Cough: Secondary | ICD-10-CM | POA: Diagnosis not present

## 2012-01-10 LAB — CMP (CANCER CENTER ONLY)
ALT(SGPT): 28 U/L (ref 10–47)
AST: 23 U/L (ref 11–38)
Albumin: 3.6 g/dL (ref 3.3–5.5)
Alkaline Phosphatase: 44 U/L (ref 26–84)
BUN, Bld: 19 mg/dL (ref 7–22)
CO2: 27 mEq/L (ref 18–33)
Calcium: 9.1 mg/dL (ref 8.0–10.3)
Chloride: 98 mEq/L (ref 98–108)
Creat: 0.7 mg/dl (ref 0.6–1.2)
Glucose, Bld: 112 mg/dL (ref 73–118)
Potassium: 4.5 mEq/L (ref 3.3–4.7)
Sodium: 145 mEq/L (ref 128–145)
Total Bilirubin: 0.5 mg/dl (ref 0.20–1.60)
Total Protein: 6.6 g/dL (ref 6.4–8.1)

## 2012-01-10 MED ORDER — IOHEXOL 300 MG/ML  SOLN
100.0000 mL | Freq: Once | INTRAMUSCULAR | Status: AC | PRN
  Administered 2012-01-10: 100 mL via INTRAVENOUS

## 2012-01-14 ENCOUNTER — Telehealth: Payer: Self-pay | Admitting: Internal Medicine

## 2012-01-14 ENCOUNTER — Ambulatory Visit (HOSPITAL_BASED_OUTPATIENT_CLINIC_OR_DEPARTMENT_OTHER): Payer: Medicare Other | Admitting: Internal Medicine

## 2012-01-14 ENCOUNTER — Ambulatory Visit: Payer: Medicare Other | Admitting: Internal Medicine

## 2012-01-14 VITALS — BP 122/70 | HR 61 | Temp 97.0°F | Ht 68.0 in | Wt 253.5 lb

## 2012-01-14 DIAGNOSIS — C3490 Malignant neoplasm of unspecified part of unspecified bronchus or lung: Secondary | ICD-10-CM | POA: Diagnosis not present

## 2012-01-14 NOTE — Telephone Encounter (Signed)
Gave pt appt for 01/13/13, lab and CT, pt will see MD the next day

## 2012-01-14 NOTE — Progress Notes (Signed)
[facility] [phone]   [phone]  OFFICE PROGRESS NOTE  Marjorie Smolder, MD, MD [place] [address]  DIAGNOSIS: Stage IA (T1, N0, MX) non-small cell lung cancer, adenocarcinoma with bronchoalveolar features diagnosed in 01/2006.   PRIOR THERAPY: Status post wedge resection of the left lower lobe with node sampling and seed implants under the care of Dr. Arlyce Dice and Dr. Tammi Klippel on 02/05/2006.   CURRENT THERAPY: Observation.    INTERVAL HISTORY: Paul [name] 71 y.o. male returns to the clinic today for three-month followup visit accompanied his wife. The patient denied having any specific complaints today except for shortness of breath with exertion. He denied having any significant chest pain no weight loss or night sweats. He was found on the previous scan to have a suspicious nodule in the right upper lobe. He has repeat CT scan of the chest performed recently and he is here for evaluation and discussion of his scan results.  MEDICAL HISTORY: Past Medical History  Diagnosis Date  . Diabetes mellitus   . Nausea & vomiting     ALLERGIES:  is allergic to codeine and phenobarbital.  MEDICATIONS:  Current Outpatient Prescriptions  Medication Sig Dispense Refill  . albuterol (PROVENTIL HFA;VENTOLIN HFA) 108 (90 BASE) MCG/ACT inhaler Inhale 2 puffs into the lungs every 6 (six) hours as needed.        Marland Kitchen esomeprazole (NEXIUM) 40 MG capsule Take 40 mg by mouth daily before breakfast.        . ezetimibe (ZETIA) 10 MG tablet Take 10 mg by mouth daily.        . fexofenadine (ALLEGRA) 180 MG tablet Take 180 mg by mouth daily.        . fluticasone (FLONASE) 50 MCG/ACT nasal spray Place 2 sprays into the nose daily.        . Fluticasone-Salmeterol (ADVAIR) 250-50 MCG/DOSE AEPB Inhale 1 puff into the lungs every 12 (twelve) hours.        . gabapentin (NEURONTIN) 800 MG tablet Take 800 mg by mouth daily.        Marland Kitchen  glimepiride (AMARYL) 4 MG tablet Take 4 mg by mouth 2 (two) times daily.        Marland Kitchen guaiFENesin (MUCINEX) 600 MG 12 hr tablet Take 1,200 mg by mouth 2 (two) times daily.        . hydrochlorothiazide (HYDRODIURIL) 12.5 MG tablet Take 12.5 mg by mouth daily.        Marland Kitchen HYDROcodone-acetaminophen (NORCO) 10-325 MG per tablet Take 1 tablet by mouth 2 (two) times daily. Am and hs       . lisinopril (PRINIVIL,ZESTRIL) 40 MG tablet Take 40 mg by mouth daily.        . metFORMIN (GLUCOPHAGE) 1000 MG tablet Take 1,000 mg by mouth 2 (two) times daily with a meal.        . metoCLOPramide (REGLAN) 10 MG tablet Take 10 mg by mouth 2 (two) times daily.        . Multiple Vitamin (MULTIVITAMIN) tablet Take 1 tablet by mouth daily. Centrum silver       . pioglitazone (ACTOS) 45 MG tablet Take 45 mg by mouth daily.        Marland Kitchen tiotropium (SPIRIVA) 18 MCG inhalation capsule Place 18 mcg into inhaler and inhale daily.          REVIEW OF SYSTEMS:  A comprehensive review of systems was negative.   PHYSICAL  EXAMINATION: General appearance: alert, cooperative and no distress Neck: no adenopathy Lymph nodes: Cervical, supraclavicular, and axillary nodes normal. Resp: clear to auscultation bilaterally Cardio: regular rate and rhythm, S1, S2 normal, no murmur, click, rub or gallop GI: soft, non-tender; bowel sounds normal; no masses,  no organomegaly Extremities: extremities normal, atraumatic, no cyanosis or edema Neurologic: Alert and oriented X 3, normal strength and tone. Normal symmetric reflexes. Normal coordination and gait  ECOG PERFORMANCE STATUS: 1 - Symptomatic but completely ambulatory  There were no vitals taken for this visit.  LABORATORY DATA: Lab Results  Component Value Date   WBC 9.8 09/14/2011   HGB 13.8 09/14/2011   HCT 40.9 09/14/2011   MCV 82.6 09/14/2011   PLT 254 09/14/2011      Chemistry      Component Value Date/Time   NA 145 01/10/2012 0859   NA 136 09/14/2011 1329   K 4.5 01/10/2012 0859    K 4.2 09/14/2011 1329   CL 98 01/10/2012 0859   CL 105 09/14/2011 1329   CO2 27 01/10/2012 0859   CO2 20 09/14/2011 1329   BUN 19 01/10/2012 0859   BUN 26* 09/14/2011 1329   CREATININE 0.7 01/10/2012 0859   CREATININE 1.02 09/14/2011 1329      Component Value Date/Time   CALCIUM 9.1 01/10/2012 0859   CALCIUM 9.8 09/14/2011 1329   ALKPHOS 44 01/10/2012 0859   ALKPHOS 48 09/14/2011 1329   AST 23 01/10/2012 0859   AST 16 09/14/2011 1329   ALT 21 09/14/2011 1329   BILITOT 0.50 01/10/2012 0859   BILITOT 0.3 09/14/2011 1329       RADIOGRAPHIC STUDIES: Ct Chest W Contrast  01/10/2012  *RADIOLOGY REPORT*  Clinical Data: Lung cancer, seed implants.  Cough and recent bronchitis.  CT CHEST WITH CONTRAST  Technique:  Multidetector CT imaging of the chest was performed following the standard protocol during bolus administration of intravenous contrast.  Contrast: 172m OMNIPAQUE IOHEXOL 300 MG/ML IJ SOLN  Comparison: 09/17/2011.  Findings: Mediastinal and hilar lymph nodes measure up to 9 mm anterior to the left mainstem bronchus, stable.  No axillary adenopathy.  Pulmonary arteries are enlarged.  Atherosclerotic calcification of the arterial vasculature, including coronary arteries.  Heart size normal.  No pericardial effusion.  Centrilobular emphysema.  Postoperative changes, radioactive seed implants and volume loss are seen in the medial left lower lobe, stable.  Mild subpleural fibrosis in the lingula, stable.  No pleural fluid.  There may be a small amount of dependent debris in the airway.  Incidental imaging of the upper abdomen shows a 5.1 x 5.8 cm mixed fat and soft tissue density lesion in the right adrenal gland, stable.  A 7 mm fat density lesion is seen in the body of the left adrenal gland.  Liver appears mildly decreased in attenuation diffusely.  Left hepatic lobe and caudate are somewhat prominent. No worrisome lytic or sclerotic lesions.  Degenerative changes are seen in the spine.  Lower thoracic and  upper lumbar vertebroplasties.  IMPRESSION:  1.  Postoperative changes and volume loss in the left lower lobe with radioactive seed implants, stable.  No evidence of recurrent or metastatic disease. 2.  Pulmonary arterial hypertension. 3.  Hepatic steatosis.  Question cirrhosis. 4.  Bilateral adrenal myelolipomas, right greater than left.  Original Report Authenticated By: MLuretha Rued M.D.    ASSESSMENT: This is a pleasant 71years old white male with history of stage IA non-small cell lung cancer status post wedge  resection of the left upper lobe with seed implants in March of 2007. The patient has been observation since that time with no evidence for disease recurrence.   PLAN: I  discussed the scan results with the patient and recommend for him continuous observation for now. I would see him back for followup visit in one year with repeat CT scan of the chest without contrast. She was advised to call immediately if he has any concerning symptoms in the interval.  All questions were answered. The patient knows to call the clinic with any problems, questions or concerns. We can certainly see the patient much sooner if necessary.

## 2012-02-07 DIAGNOSIS — M545 Low back pain, unspecified: Secondary | ICD-10-CM | POA: Diagnosis not present

## 2012-02-07 DIAGNOSIS — G894 Chronic pain syndrome: Secondary | ICD-10-CM | POA: Diagnosis not present

## 2012-02-26 DIAGNOSIS — M79609 Pain in unspecified limb: Secondary | ICD-10-CM | POA: Diagnosis not present

## 2012-02-26 DIAGNOSIS — B351 Tinea unguium: Secondary | ICD-10-CM | POA: Diagnosis not present

## 2012-03-25 ENCOUNTER — Inpatient Hospital Stay (HOSPITAL_COMMUNITY)
Admission: EM | Admit: 2012-03-25 | Discharge: 2012-03-28 | DRG: 176 | Disposition: A | Discharge status: Home / Self Care | Payer: Medicare Other | Attending: Internal Medicine | Admitting: Internal Medicine

## 2012-03-25 ENCOUNTER — Encounter (HOSPITAL_COMMUNITY): Payer: Self-pay | Admitting: *Deleted

## 2012-03-25 ENCOUNTER — Emergency Department (HOSPITAL_COMMUNITY): Payer: Medicare Other

## 2012-03-25 DIAGNOSIS — J449 Chronic obstructive pulmonary disease, unspecified: Secondary | ICD-10-CM

## 2012-03-25 DIAGNOSIS — C3490 Malignant neoplasm of unspecified part of unspecified bronchus or lung: Secondary | ICD-10-CM | POA: Diagnosis present

## 2012-03-25 DIAGNOSIS — Z902 Acquired absence of lung [part of]: Secondary | ICD-10-CM

## 2012-03-25 DIAGNOSIS — IMO0002 Reserved for concepts with insufficient information to code with codable children: Secondary | ICD-10-CM | POA: Insufficient documentation

## 2012-03-25 DIAGNOSIS — E669 Obesity, unspecified: Secondary | ICD-10-CM | POA: Diagnosis present

## 2012-03-25 DIAGNOSIS — R079 Chest pain, unspecified: Secondary | ICD-10-CM

## 2012-03-25 DIAGNOSIS — J90 Pleural effusion, not elsewhere classified: Secondary | ICD-10-CM | POA: Diagnosis not present

## 2012-03-25 DIAGNOSIS — I480 Paroxysmal atrial fibrillation: Secondary | ICD-10-CM | POA: Diagnosis not present

## 2012-03-25 DIAGNOSIS — J9819 Other pulmonary collapse: Secondary | ICD-10-CM | POA: Diagnosis not present

## 2012-03-25 DIAGNOSIS — I4891 Unspecified atrial fibrillation: Secondary | ICD-10-CM | POA: Diagnosis present

## 2012-03-25 DIAGNOSIS — I2510 Atherosclerotic heart disease of native coronary artery without angina pectoris: Secondary | ICD-10-CM | POA: Diagnosis not present

## 2012-03-25 DIAGNOSIS — I517 Cardiomegaly: Secondary | ICD-10-CM | POA: Diagnosis not present

## 2012-03-25 DIAGNOSIS — R0602 Shortness of breath: Secondary | ICD-10-CM | POA: Diagnosis not present

## 2012-03-25 DIAGNOSIS — E1165 Type 2 diabetes mellitus with hyperglycemia: Secondary | ICD-10-CM | POA: Insufficient documentation

## 2012-03-25 DIAGNOSIS — E118 Type 2 diabetes mellitus with unspecified complications: Secondary | ICD-10-CM

## 2012-03-25 DIAGNOSIS — F411 Generalized anxiety disorder: Secondary | ICD-10-CM | POA: Diagnosis present

## 2012-03-25 DIAGNOSIS — J4489 Other specified chronic obstructive pulmonary disease: Secondary | ICD-10-CM | POA: Diagnosis present

## 2012-03-25 DIAGNOSIS — I2699 Other pulmonary embolism without acute cor pulmonale: Principal | ICD-10-CM

## 2012-03-25 DIAGNOSIS — R0902 Hypoxemia: Secondary | ICD-10-CM

## 2012-03-25 HISTORY — DX: Malignant (primary) neoplasm, unspecified: C80.1

## 2012-03-25 HISTORY — DX: Chronic obstructive pulmonary disease, unspecified: J44.9

## 2012-03-25 HISTORY — DX: Other specified disorders of bone density and structure, unspecified site: M85.80

## 2012-03-25 HISTORY — DX: Gastro-esophageal reflux disease without esophagitis: K21.9

## 2012-03-25 HISTORY — DX: Essential (primary) hypertension: I10

## 2012-03-25 LAB — CBC
HCT: 39.2 % (ref 39.0–52.0)
Hemoglobin: 13.3 g/dL (ref 13.0–17.0)
MCH: 27.4 pg (ref 26.0–34.0)
MCHC: 33.9 g/dL (ref 30.0–36.0)
MCV: 80.7 fL (ref 78.0–100.0)
Platelets: 210 10*3/uL (ref 150–400)
RBC: 4.86 MIL/uL (ref 4.22–5.81)
RDW: 15 % (ref 11.5–15.5)
WBC: 11.1 10*3/uL — ABNORMAL HIGH (ref 4.0–10.5)

## 2012-03-25 LAB — DIFFERENTIAL
Basophils Absolute: 0 10*3/uL (ref 0.0–0.1)
Basophils Relative: 0 % (ref 0–1)
Eosinophils Absolute: 0 10*3/uL (ref 0.0–0.7)
Eosinophils Relative: 0 % (ref 0–5)
Lymphocytes Relative: 8 % — ABNORMAL LOW (ref 12–46)
Lymphs Abs: 0.9 10*3/uL (ref 0.7–4.0)
Monocytes Absolute: 1 10*3/uL (ref 0.1–1.0)
Monocytes Relative: 9 % (ref 3–12)
Neutro Abs: 9.1 10*3/uL — ABNORMAL HIGH (ref 1.7–7.7)
Neutrophils Relative %: 82 % — ABNORMAL HIGH (ref 43–77)

## 2012-03-25 LAB — CARDIAC PANEL(CRET KIN+CKTOT+MB+TROPI)
CK, MB: 2.8 ng/mL (ref 0.3–4.0)
Relative Index: INVALID (ref 0.0–2.5)
Total CK: 62 U/L (ref 7–232)
Troponin I: <0.3 ng/mL (ref <0.30)

## 2012-03-25 LAB — APTT: aPTT: 27 seconds (ref 24–37)

## 2012-03-25 LAB — PROTIME-INR
INR: 0.95 (ref 0.00–1.49)
Prothrombin Time: 12.9 seconds (ref 11.6–15.2)

## 2012-03-25 LAB — POCT I-STAT, CHEM 8
BUN: 20 mg/dL (ref 6–23)
Calcium, Ion: 1.14 mmol/L (ref 1.12–1.32)
Chloride: 107 mEq/L (ref 96–112)
Creatinine, Ser: 0.9 mg/dL (ref 0.50–1.35)
Glucose, Bld: 210 mg/dL — ABNORMAL HIGH (ref 70–99)
HCT: 39 % (ref 39.0–52.0)
Hemoglobin: 13.3 g/dL (ref 13.0–17.0)
Potassium: 3.8 mEq/L (ref 3.5–5.1)
Sodium: 135 mEq/L (ref 135–145)
TCO2: 20 mmol/L (ref 0–100)

## 2012-03-25 LAB — HEMOGLOBIN A1C
Hgb A1c MFr Bld: 7.1 % — ABNORMAL HIGH (ref <5.7)
Mean Plasma Glucose: 157 mg/dL — ABNORMAL HIGH (ref <117)

## 2012-03-25 LAB — HOMOCYSTEINE: Homocysteine: 10.2 umol/L (ref 4.0–15.4)

## 2012-03-25 LAB — GLUCOSE, CAPILLARY
Glucose-Capillary: 164 mg/dL — ABNORMAL HIGH (ref 70–99)
Glucose-Capillary: 159 mg/dL — ABNORMAL HIGH (ref 70–99)

## 2012-03-25 MED ORDER — ONDANSETRON HCL 4 MG/2ML IJ SOLN: 4.0000 mg | Freq: Four times a day (QID) | INTRAMUSCULAR | Status: DC | PRN

## 2012-03-25 MED ORDER — IPRATROPIUM BROMIDE 0.02 % IN SOLN: 0.5000 mg | Freq: Four times a day (QID) | RESPIRATORY_TRACT | Status: DC

## 2012-03-25 MED ORDER — SODIUM CHLORIDE 0.9 % IV SOLN
INTRAVENOUS | Status: DC
  Administered 2012-03-25: 50 mL/h via INTRAVENOUS
  Administered 2012-03-26: 13:00:00 via INTRAVENOUS

## 2012-03-25 MED ORDER — ALBUTEROL SULFATE HFA 108 (90 BASE) MCG/ACT IN AERS
2.0000 | INHALATION_SPRAY | Freq: Four times a day (QID) | RESPIRATORY_TRACT | Status: DC
  Filled 2012-03-25: qty 6.7

## 2012-03-25 MED ORDER — OXYCODONE HCL 5 MG PO TABS
5.0000 mg | ORAL_TABLET | ORAL | Status: DC | PRN
  Administered 2012-03-25: 5 mg via ORAL
  Filled 2012-03-25: qty 1

## 2012-03-25 MED ORDER — COUMADIN BOOK
Freq: Once | Status: AC
  Administered 2012-03-25: 18:00:00
  Filled 2012-03-25: qty 1

## 2012-03-25 MED ORDER — SENNA 8.6 MG PO TABS
1.0000 | ORAL_TABLET | Freq: Two times a day (BID) | ORAL | Status: DC
  Administered 2012-03-25 – 2012-03-27 (×3): 8.6 mg via ORAL
  Filled 2012-03-25 (×3): qty 1

## 2012-03-25 MED ORDER — GUAIFENESIN ER 600 MG PO TB12
1200.0000 mg | ORAL_TABLET | Freq: Every day | ORAL | Status: DC
  Administered 2012-03-26 – 2012-03-28 (×3): 1200 mg via ORAL
  Filled 2012-03-25 (×3): qty 2

## 2012-03-25 MED ORDER — ALBUTEROL SULFATE HFA 108 (90 BASE) MCG/ACT IN AERS
2.0000 | INHALATION_SPRAY | Freq: Four times a day (QID) | RESPIRATORY_TRACT | Status: DC | PRN
  Filled 2012-03-25: qty 6.7

## 2012-03-25 MED ORDER — SODIUM CHLORIDE 0.9 % IV BOLUS (SEPSIS)
500.0000 mL | Freq: Once | INTRAVENOUS | Status: AC
  Administered 2012-03-25: 500 mL via INTRAVENOUS

## 2012-03-25 MED ORDER — HYDROMORPHONE HCL PF 1 MG/ML IJ SOLN
1.0000 mg | Freq: Once | INTRAMUSCULAR | Status: AC
  Administered 2012-03-25: 1 mg via INTRAVENOUS
  Filled 2012-03-25: qty 1

## 2012-03-25 MED ORDER — EZETIMIBE 10 MG PO TABS
10.0000 mg | ORAL_TABLET | Freq: Every day | ORAL | Status: DC
  Administered 2012-03-25 – 2012-03-27 (×3): 10 mg via ORAL
  Filled 2012-03-25 (×4): qty 1

## 2012-03-25 MED ORDER — WARFARIN VIDEO
Freq: Once | Status: DC
Start: 2012-03-25 — End: 2012-03-26

## 2012-03-25 MED ORDER — HYDROCHLOROTHIAZIDE 12.5 MG PO CAPS
12.5000 mg | ORAL_CAPSULE | Freq: Every day | ORAL | Status: DC
Start: 2012-03-26 — End: 2012-03-28
  Administered 2012-03-26 – 2012-03-28 (×3): 12.5 mg via ORAL
  Filled 2012-03-25 (×3): qty 1

## 2012-03-25 MED ORDER — DOCUSATE SODIUM 100 MG PO CAPS
100.0000 mg | ORAL_CAPSULE | Freq: Two times a day (BID) | ORAL | Status: DC
  Administered 2012-03-25 – 2012-03-28 (×5): 100 mg via ORAL
  Filled 2012-03-25 (×8): qty 1

## 2012-03-25 MED ORDER — GABAPENTIN 800 MG PO TABS
800.0000 mg | ORAL_TABLET | Freq: Three times a day (TID) | ORAL | Status: DC
  Administered 2012-03-25 – 2012-03-26 (×3): 800 mg via ORAL
  Filled 2012-03-25 (×8): qty 1

## 2012-03-25 MED ORDER — ENOXAPARIN SODIUM 120 MG/0.8ML ~~LOC~~ SOLN
120.0000 mg | Freq: Two times a day (BID) | SUBCUTANEOUS | Status: DC
  Filled 2012-03-25 (×2): qty 0.8

## 2012-03-25 MED ORDER — SODIUM CHLORIDE 0.9 % IJ SOLN
3.0000 mL | Freq: Two times a day (BID) | INTRAMUSCULAR | Status: DC
  Administered 2012-03-26 – 2012-03-27 (×3): 3 mL via INTRAVENOUS

## 2012-03-25 MED ORDER — LORATADINE 10 MG PO TABS
10.0000 mg | ORAL_TABLET | Freq: Every day | ORAL | Status: DC
  Administered 2012-03-26 – 2012-03-28 (×3): 10 mg via ORAL
  Filled 2012-03-25 (×3): qty 1

## 2012-03-25 MED ORDER — IPRATROPIUM BROMIDE 0.02 % IN SOLN
0.5000 mg | Freq: Once | RESPIRATORY_TRACT | Status: AC
  Administered 2012-03-25: 0.5 mg via RESPIRATORY_TRACT

## 2012-03-25 MED ORDER — HYDROMORPHONE HCL PF 1 MG/ML IJ SOLN: 0.5000 mg | INTRAMUSCULAR | Status: DC | PRN

## 2012-03-25 MED ORDER — PIOGLITAZONE HCL 45 MG PO TABS
45.0000 mg | ORAL_TABLET | Freq: Every day | ORAL | Status: DC
  Administered 2012-03-26 – 2012-03-28 (×3): 45 mg via ORAL
  Filled 2012-03-25 (×4): qty 1

## 2012-03-25 MED ORDER — TIOTROPIUM BROMIDE MONOHYDRATE 18 MCG IN CAPS
18.0000 ug | ORAL_CAPSULE | Freq: Every day | RESPIRATORY_TRACT | Status: DC
  Administered 2012-03-26 – 2012-03-28 (×3): 18 ug via RESPIRATORY_TRACT
  Filled 2012-03-25: qty 5

## 2012-03-25 MED ORDER — WARFARIN SODIUM 7.5 MG PO TABS
7.5000 mg | ORAL_TABLET | Freq: Once | ORAL | Status: AC
  Administered 2012-03-25: 7.5 mg via ORAL
  Filled 2012-03-25: qty 1

## 2012-03-25 MED ORDER — ENOXAPARIN SODIUM 120 MG/0.8ML ~~LOC~~ SOLN
110.0000 mg | Freq: Two times a day (BID) | SUBCUTANEOUS | Status: DC
  Administered 2012-03-25 – 2012-03-26 (×2): 110 mg via SUBCUTANEOUS
  Filled 2012-03-25 (×4): qty 0.8

## 2012-03-25 MED ORDER — INSULIN ASPART 100 UNIT/ML ~~LOC~~ SOLN
0.0000 [IU] | Freq: Three times a day (TID) | SUBCUTANEOUS | Status: DC
  Administered 2012-03-25: 3 [IU] via SUBCUTANEOUS
  Administered 2012-03-26 – 2012-03-27 (×2): 2 [IU] via SUBCUTANEOUS
  Administered 2012-03-27: 5 [IU] via SUBCUTANEOUS
  Administered 2012-03-27: 2 [IU] via SUBCUTANEOUS
  Administered 2012-03-28 (×2): 3 [IU] via SUBCUTANEOUS

## 2012-03-25 MED ORDER — PANTOPRAZOLE SODIUM 40 MG PO TBEC
40.0000 mg | DELAYED_RELEASE_TABLET | Freq: Every day | ORAL | Status: DC
  Administered 2012-03-25 – 2012-03-27 (×3): 40 mg via ORAL
  Filled 2012-03-25 (×4): qty 1

## 2012-03-25 MED ORDER — ALBUTEROL SULFATE (5 MG/ML) 0.5% IN NEBU
5.0000 mg | INHALATION_SOLUTION | Freq: Once | RESPIRATORY_TRACT | Status: AC
  Administered 2012-03-25: 5 mg via RESPIRATORY_TRACT

## 2012-03-25 MED ORDER — WARFARIN - PHARMACIST DOSING INPATIENT: Freq: Every day | Status: DC

## 2012-03-25 MED ORDER — FLUTICASONE-SALMETEROL 250-50 MCG/DOSE IN AEPB
1.0000 | INHALATION_SPRAY | Freq: Two times a day (BID) | RESPIRATORY_TRACT | Status: DC
  Administered 2012-03-25 – 2012-03-28 (×6): 1 via RESPIRATORY_TRACT
  Filled 2012-03-25: qty 14

## 2012-03-25 MED ORDER — GLIMEPIRIDE 4 MG PO TABS
4.0000 mg | ORAL_TABLET | Freq: Two times a day (BID) | ORAL | Status: DC
  Administered 2012-03-25 – 2012-03-28 (×6): 4 mg via ORAL
  Filled 2012-03-25 (×9): qty 1

## 2012-03-25 MED ORDER — FLUTICASONE PROPIONATE 50 MCG/ACT NA SUSP
2.0000 | Freq: Every day | NASAL | Status: DC
  Administered 2012-03-26 – 2012-03-28 (×3): 2 via NASAL
  Filled 2012-03-25: qty 16

## 2012-03-25 MED ORDER — ONDANSETRON HCL 4 MG PO TABS: 4.0000 mg | ORAL_TABLET | Freq: Four times a day (QID) | ORAL | Status: DC | PRN

## 2012-03-25 MED ORDER — IOHEXOL 300 MG/ML  SOLN
100.0000 mL | Freq: Once | INTRAMUSCULAR | Status: AC | PRN
  Administered 2012-03-25: 100 mL via INTRAVENOUS

## 2012-03-25 MED ORDER — METOCLOPRAMIDE HCL 10 MG PO TABS
10.0000 mg | ORAL_TABLET | Freq: Two times a day (BID) | ORAL | Status: DC
  Administered 2012-03-26 – 2012-03-28 (×4): 10 mg via ORAL
  Filled 2012-03-25 (×6): qty 1

## 2012-03-25 NOTE — ED Notes (Signed)
Pt transferred to 1401 with chart and personal belongings, condition stable at time of transfer.

## 2012-03-25 NOTE — H&P (Signed)
Paul [name] MRN: [id number] DOB/AGE: Dec 14, 1940 71 y.o. Primary Keith, MD, MD Admit date: 03/25/2012 Chief Complaint: Shortness of breath   HPI: 71 year-old male with a history of non-small cell lung cancer, adenocarcinoma with bronchoalveolar features diagnosed in March of 2007. Status post wedge resection of the left lower lobe by Dr. Arlyce Dice and Dr. Tammi Klippel, currently getting serial CT scans by Dr. Curt Bears, who comes in with shortness of breath.The patient has been observation since that time with no evidence for disease recurrence.   He presents today because of chest pain, shortness of breath, cough. No nausea no vomiting no fever no hemoptysis no hematochezia or melena.Associated symptoms include chest pain, cough and shortness of breath.  patient has had right-sided chest pain for last couple days. Is constant. It is worse with movement or deep breathing. He states he is unable to bring this deeply daily. No fevers. His cough is nonproductive sputum. He states he is a chronic cough due to COPD. No trauma. He has not had pains like this before. No relief with his Norco 10 mg. It is in his right upper chest and radiates through to his back.    Past Medical History  Diagnosis Date  . Diabetes mellitus   . Nausea & vomiting   . Hypertension   . COPD (chronic obstructive pulmonary disease)   . Asthma   . GERD (gastroesophageal reflux disease)   . Osteopenia   . lung ca dx'd 01/2006    Past Surgical History  Procedure Date  . Lung cancer surgery 01/2006    Small excision of left lung tissue; mesh with radioactive seeds (per pt report)  . Cholecystectomy   . Tonsillectomy   . Angioplasty Approx 1993  . Cardiac catheterization Approximately 1993  . Fixation kyphoplasty     Prior to Admission medications   Medication Sig Start Date End Date Taking? Authorizing Provider  albuterol (PROVENTIL HFA;VENTOLIN HFA) 108 (90 BASE) MCG/ACT inhaler Inhale 2  puffs into the lungs every 6 (six) hours as needed.     Yes Historical Provider, MD  esomeprazole (NEXIUM) 40 MG capsule Take 40 mg by mouth daily before breakfast.     Yes Historical Provider, MD  ezetimibe (ZETIA) 10 MG tablet Take 10 mg by mouth daily.     Yes Historical Provider, MD  fexofenadine (ALLEGRA) 180 MG tablet Take 180 mg by mouth daily.     Yes Historical Provider, MD  fluticasone (FLONASE) 50 MCG/ACT nasal spray Place 2 sprays into the nose daily.     Yes Historical Provider, MD  Fluticasone-Salmeterol (ADVAIR) 250-50 MCG/DOSE AEPB Inhale 1 puff into the lungs every 12 (twelve) hours.     Yes Historical Provider, MD  gabapentin (NEURONTIN) 800 MG tablet Take 800 mg by mouth 3 (three) times daily.    Yes Historical Provider, MD  glimepiride (AMARYL) 4 MG tablet Take 4 mg by mouth 2 (two) times daily.     Yes Historical Provider, MD  guaiFENesin (MUCINEX) 600 MG 12 hr tablet Take 1,200 mg by mouth daily.   Yes Historical Provider, MD  hydrochlorothiazide (HYDRODIURIL) 12.5 MG tablet Take 12.5 mg by mouth daily.     Yes Historical Provider, MD  HYDROcodone-acetaminophen (NORCO) 10-325 MG per tablet Take 1 tablet by mouth 2 (two) times daily. Am and hs    Yes Historical Provider, MD  lisinopril (PRINIVIL,ZESTRIL) 40 MG tablet Take 40 mg by mouth daily.     Yes Historical Provider, MD  metFORMIN (  GLUCOPHAGE) 1000 MG tablet Take 1,000 mg by mouth 2 (two) times daily with a meal.     Yes Historical Provider, MD  metoCLOPramide (REGLAN) 10 MG tablet Take 10 mg by mouth 2 (two) times daily.     Yes Historical Provider, MD  Multiple Vitamin (MULTIVITAMIN) tablet Take 1 tablet by mouth daily. Centrum silver    Yes Historical Provider, MD  multivitamin-iron-minerals-folic acid (CENTRUM) chewable tablet Chew 1 tablet by mouth daily.   Yes Historical Provider, MD  pioglitazone (ACTOS) 45 MG tablet Take 45 mg by mouth daily.     Yes Historical Provider, MD  tiotropium (SPIRIVA) 18 MCG inhalation  capsule Place 18 mcg into inhaler and inhale daily.     Yes Historical Provider, MD    Allergies:  Allergies  Allergen Reactions  . Codeine Anaphylaxis  . Phenobarbital Hypertension    History reviewed. No pertinent family history.  Social History:  reports that he has been smoking.  He has never used smokeless tobacco. He reports that he does not drink alcohol or use illicit drugs.         ROS complete 14 point review of systems was done and pertinent positives listed below and in HPI   Constitutional: Negative for activity change and appetite change.  HENT: Negative for neck stiffness.  Eyes: Negative for pain.  Respiratory: Positive for cough and shortness of breath. Negative for chest tightness.  Cardiovascular: Positive for chest pain. Negative for leg swelling.  Gastrointestinal: Negative for nausea, vomiting, abdominal pain and diarrhea.  Genitourinary: Negative for flank pain.  Musculoskeletal: Negative for back pain.  Skin: Negative for rash.  Neurological: Negative for weakness, numbness and headaches.  Psychiatric/Behavioral: Negative for behavioral problems.   PHYSICAL EXAM: Blood pressure 129/59, pulse 98, temperature 98.5 F (36.9 C), temperature source Oral, resp. rate 18, SpO2 95.00%. Constitutional: He is oriented to person, place, and time. He appears well-developed and well-nourished.  HENT:  Head: Normocephalic and atraumatic.  Eyes: EOM are normal. Pupils are equal, round, and reactive to light.  Neck: Normal range of motion. Neck supple.  Cardiovascular: Normal rate, regular rhythm and normal heart sounds.  No murmur heard.  Pulmonary/Chest: Effort normal. He exhibits tenderness.  Mildly harsh breath sounds. Tenderness to right upper anterior chest wall. No rash. Patient will be hypoxic to the upper 80s on room air  Abdominal: Soft. Bowel sounds are normal. He exhibits no distension and no mass. There is no tenderness. There is no rebound and no  guarding.  Musculoskeletal: Normal range of motion. He exhibits no edema.  Neurological: He is alert and oriented to person, place, and time. No cranial nerve deficit.  Skin: Skin is warm and dry.  Psychiatric: He has a normal mood and affect.     No results found for this or any previous visit (from the past 240 hour(s)).   Results for orders placed during the hospital encounter of 03/25/12 (from the past 48 hour(s))  CBC     Status: Abnormal   Collection Time   03/25/12 11:55 AM      Component Value Range Comment   WBC 11.1 (*) 4.0 - 10.5 (K/uL)    RBC 4.86  4.22 - 5.81 (MIL/uL)    Hemoglobin 13.3  13.0 - 17.0 (g/dL)    HCT 39.2  39.0 - 52.0 (%)    MCV 80.7  78.0 - 100.0 (fL)    MCH 27.4  26.0 - 34.0 (pg)    MCHC 33.9  30.0 -  36.0 (g/dL)    RDW 15.0  11.5 - 15.5 (%)    Platelets 210  150 - 400 (K/uL)   DIFFERENTIAL     Status: Abnormal   Collection Time   03/25/12 11:55 AM      Component Value Range Comment   Neutrophils Relative 82 (*) 43 - 77 (%)    Neutro Abs 9.1 (*) 1.7 - 7.7 (K/uL)    Lymphocytes Relative 8 (*) 12 - 46 (%)    Lymphs Abs 0.9  0.7 - 4.0 (K/uL)    Monocytes Relative 9  3 - 12 (%)    Monocytes Absolute 1.0  0.1 - 1.0 (K/uL)    Eosinophils Relative 0  0 - 5 (%)    Eosinophils Absolute 0.0  0.0 - 0.7 (K/uL)    Basophils Relative 0  0 - 1 (%)    Basophils Absolute 0.0  0.0 - 0.1 (K/uL)   POCT I-STAT, CHEM 8     Status: Abnormal   Collection Time   03/25/12 12:20 PM      Component Value Range Comment   Sodium 135  135 - 145 (mEq/L)    Potassium 3.8  3.5 - 5.1 (mEq/L)    Chloride 107  96 - 112 (mEq/L)    BUN 20  6 - 23 (mg/dL)    Creatinine, Ser 0.90  0.50 - 1.35 (mg/dL)    Glucose, Bld 210 (*) 70 - 99 (mg/dL)    Calcium, Ion 1.14  1.12 - 1.32 (mmol/L)    TCO2 20  0 - 100 (mmol/L)    Hemoglobin 13.3  13.0 - 17.0 (g/dL)    HCT 39.0  39.0 - 52.0 (%)     Dg Ribs Unilateral W/chest Right  03/25/2012  *RADIOLOGY REPORT*  Clinical Data: Chest pain, rib  pain  RIGHT RIBS AND CHEST - 3+ VIEW  Comparison: Chest x-ray 04/06/2009  Findings: Five views submitted for interpretation. Cardiomediastinal silhouette is stable.  Stable chronic interstitial prominence.  Stable postsurgical changes left mediastinum or paraspinal region.  No acute infiltrate or pulmonary edema.  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  No right rib fracture is identified. No diagnostic pneumothorax. Post cholecystectomy surgical clips are noted in the right upper abdomen.  IMPRESSION: No acute disease.  Stable chronic interstitial prominence.  No right rib fracture is identified.  No diagnostic pneumothorax.  Original Report Authenticated By: Lahoma Crocker, M.D.   Ct Angio Chest W/cm &/or Wo Cm  03/25/2012  *RADIOLOGY REPORT*  Clinical Data: Chest pain, cough, shortness of breath  CT ANGIOGRAPHY CHEST  Technique:  Multidetector CT imaging of the chest using the standard protocol during bolus administration of intravenous contrast. Multiplanar reconstructed images including MIPs were obtained and reviewed to evaluate the vascular anatomy.  Contrast: 115m OMNIPAQUE IOHEXOL 300 MG/ML  SOLN  Comparison: Chest radiographs dated 03/25/2012.  CT chest dated 01/10/2012.  Findings: Segmental/subsegmental pulmonary emboli in the left upper lobe (series 6/image 96), in the right middle lobe (series 6/image 145), and right lower lobe (series 6/image 157).  Overall clot burden is moderate.  Mild to moderate centrilobular emphysematous changes.  Postsurgical changes/volume loss in the left lower lobe with brachytherapy seeds.  Small right pleural effusion with associated right lower lobe opacity, likely atelectasis.  No pneumothorax.  Visualized thyroid is unremarkable.  The heart is normal in size.  No pericardial effusion.  Coronary atherosclerosis.  Sclerotic calcifications of the aortic arch.  Small mediastinal lymph nodes which do  not meet pathologic CT size criteria.   No suspicious hilar or axillary lymphadenopathy.  Visualized upper abdomen is notable for a right adrenal myelolipoma and hepatic steatosis.  Degenerative changes of the visualized thoracolumbar spine.  No rib fracture is seen.  IMPRESSION: Bilateral segmental/subsegmental pulmonary emboli, as above. Overall clot burden is moderate.  Postsurgical changes/volume loss with brachytherapy seeds in the left lower lobe.  Small right pleural effusion with associated right lower lobe atelectasis.  Underlying centrilobular emphysematous changes.  Critical Value/emergent results were called by telephone at the time of interpretation on 03/25/2012  at 1425 hours  to  Dr. Davonna Belling, who verbally acknowledged these results.  Original Report Authenticated By: Julian Hy, M.D.    Impression:  Active Problems:  Acute pulmonary embolism  Shortness of breath  Type 2 diabetes COPD   Plan: #1 admit the patient to telemetry, we'll do a 2-D echo to rule out right heart strain, cycle cardiac enzymes, Lovenox and Coumadin per pharmacy protocol, no evidence of recurrence of malignancy #2 type 2 diabetes continue with glipizide and Actos hold metformin, start sliding scale insulin #3 COPD exacerbation secondary to pulmonary embolism, we'll hold off on IV steroids at this point but continue with nebulizers and Advair and Flonase He is a full code     [facility] 03/25/2012, 3:19 PM

## 2012-03-25 NOTE — ED Notes (Signed)
Attempted to call report to Gildardo Cranker, RN on 4E, unable, to call this nurse back in 5 minutes, will monitor.

## 2012-03-25 NOTE — ED Notes (Signed)
Patient returned from [facility].

## 2012-03-25 NOTE — Progress Notes (Signed)
ANTICOAGULATION CONSULT NOTE - Initial Consult  Pharmacy Consult for Coumadin/Lovenox Indication: pulmonary embolus  Allergies  Allergen Reactions  . Codeine Anaphylaxis  . Phenobarbital Hypertension    Patient Measurements: Height: 5' 7.5" (171.5 cm) Weight: 243 lb 12.8 oz (110.587 kg) IBW/kg (Calculated) : 67.25   Vital Signs: Temp: 98.2 F (36.8 C) (05/14 1536) Temp src: Oral (05/14 1536) BP: 116/54 mmHg (05/14 1536) Pulse Rate: 84  (05/14 1536)  Labs:  Basename 03/25/12 1525 03/25/12 1220 03/25/12 1155  HGB -- 13.3 13.3  HCT -- 39.0 39.2  PLT -- -- 210  APTT 27 -- --  LABPROT 12.9 -- --  INR 0.95 -- --  HEPARINUNFRC -- -- --  CREATININE -- 0.90 --  CKTOTAL -- -- --  CKMB -- -- --  TROPONINI -- -- --    CrCl 48m/min   Medical History: Past Medical History  Diagnosis Date  . Diabetes mellitus   . Nausea & vomiting   . Hypertension   . COPD (chronic obstructive pulmonary disease)   . Asthma   . GERD (gastroesophageal reflux disease)   . Osteopenia   . lung ca dx'd 01/2006    Medications:  Prescriptions prior to admission  Medication Sig Dispense Refill  . albuterol (PROVENTIL HFA;VENTOLIN HFA) 108 (90 BASE) MCG/ACT inhaler Inhale 2 puffs into the lungs every 6 (six) hours as needed.        .Marland Kitchenesomeprazole (NEXIUM) 40 MG capsule Take 40 mg by mouth daily before breakfast.        . ezetimibe (ZETIA) 10 MG tablet Take 10 mg by mouth daily.        . fexofenadine (ALLEGRA) 180 MG tablet Take 180 mg by mouth daily.        . fluticasone (FLONASE) 50 MCG/ACT nasal spray Place 2 sprays into the nose daily.        . Fluticasone-Salmeterol (ADVAIR) 250-50 MCG/DOSE AEPB Inhale 1 puff into the lungs every 12 (twelve) hours.        . gabapentin (NEURONTIN) 800 MG tablet Take 800 mg by mouth 3 (three) times daily.       .Marland Kitchenglimepiride (AMARYL) 4 MG tablet Take 4 mg by mouth 2 (two) times daily.        .Marland KitchenguaiFENesin (MUCINEX) 600 MG 12 hr tablet Take 1,200 mg by mouth  daily.      . hydrochlorothiazide (HYDRODIURIL) 12.5 MG tablet Take 12.5 mg by mouth daily.        .Marland KitchenHYDROcodone-acetaminophen (NORCO) 10-325 MG per tablet Take 1 tablet by mouth 2 (two) times daily. Am and hs       . lisinopril (PRINIVIL,ZESTRIL) 40 MG tablet Take 40 mg by mouth daily.        . metFORMIN (GLUCOPHAGE) 1000 MG tablet Take 1,000 mg by mouth 2 (two) times daily with a meal.        . metoCLOPramide (REGLAN) 10 MG tablet Take 10 mg by mouth 2 (two) times daily.        . Multiple Vitamin (MULTIVITAMIN) tablet Take 1 tablet by mouth daily. Centrum silver       . multivitamin-iron-minerals-folic acid (CENTRUM) chewable tablet Chew 1 tablet by mouth daily.      . pioglitazone (ACTOS) 45 MG tablet Take 45 mg by mouth daily.        .Marland Kitchentiotropium (SPIRIVA) 18 MCG inhalation capsule Place 18 mcg into inhaler and inhale daily.         Scheduled:     .  albuterol  2 puff Inhalation QID  . albuterol  5 mg Nebulization Once  . docusate sodium  100 mg Oral BID  . enoxaparin (LOVENOX) injection  110 mg Subcutaneous Q12H  . ezetimibe  10 mg Oral Daily  . fluticasone  2 spray Each Nare Daily  . Fluticasone-Salmeterol  1 puff Inhalation Q12H  . gabapentin  800 mg Oral TID  . glimepiride  4 mg Oral BID WC  . guaiFENesin  1,200 mg Oral Daily  . hydrochlorothiazide  12.5 mg Oral Daily  .  HYDROmorphone (DILAUDID) injection  1 mg Intravenous Once  . insulin aspart  0-15 Units Subcutaneous TID WC  . ipratropium  0.5 mg Nebulization Once  . loratadine  10 mg Oral Daily  . metoCLOPramide  10 mg Oral BID AC  . pantoprazole  40 mg Oral Q1200  . pioglitazone  45 mg Oral Q breakfast  . senna  1 tablet Oral BID  . sodium chloride  500 mL Intravenous Once  . sodium chloride  3 mL Intravenous Q12H  . tiotropium  18 mcg Inhalation Daily  . warfarin  7.5 mg Oral ONCE-1800  . Warfarin - Pharmacist Dosing Inpatient   Does not apply q1800  . DISCONTD: albuterol  2 puff Inhalation QID  . DISCONTD:  enoxaparin (LOVENOX) injection  120 mg Subcutaneous Q12H  . DISCONTD: ipratropium  0.5 mg Nebulization Q6H   Infusions:    . sodium chloride     PRN: albuterol, HYDROmorphone (DILAUDID) injection, iohexol, ondansetron (ZOFRAN) IV, ondansetron, oxyCODONE  Assessment:  43 yom w/ hx non-small cell lung cancer (diagnosed 01/2006) w/ new PE  Pharmacy to start coumadin and lovenox  Goal of Therapy:  INR 2-3 Anti-Xa level 0.6-1.2 units/ml 4hrs after LMWH dose given Monitor platelets by anticoagulation protocol: Yes   Plan:   Lovenox 110 mg sq q12h   Consider anti-Xa level at steady state given obesity  Coumadin 7.84m x 1 tonight  Today will be day one of minimum 5 day overlap Coumadin + Lovenox. Will also need 2 day overlap with tx INR (whichever is longer)  Coumadin education before d/c  Daily INR  Given CA diagnosis would recommend considering Lovenox alone x 6 months as per CHEST Guidelines for treatment of VTE + CA diagnosis. Noted that patient is currently not receiving tx per Dr MJulien Nordmannand is under continuous observation   CMarcell AngerPharmD  [phone]/14/2013 5:40 PM

## 2012-03-25 NOTE — ED Notes (Signed)
Patient returned from to X-ray.

## 2012-03-25 NOTE — ED Notes (Signed)
Patient transported to CT 

## 2012-03-25 NOTE — ED Provider Notes (Signed)
History     CSN: [id number]  Arrival date & time 03/25/12  1056   First MD Initiated Contact with Patient 03/25/12 1131      Chief Complaint  Patient presents with  . Chest Pain  . Cough  . Shortness of Breath    (Consider location/radiation/quality/duration/timing/severity/associated sxs/prior treatment) Patient is a 71 y.o. male presenting with chest pain, cough, and shortness of breath. The history is provided by the patient.  Chest Pain Primary symptoms include shortness of breath and cough. Pertinent negatives for primary symptoms include no abdominal pain, no nausea and no vomiting.  Pertinent negatives for associated symptoms include no numbness and no weakness.    Cough Associated symptoms include chest pain and shortness of breath. Pertinent negatives include no headaches.  Shortness of Breath  Associated symptoms include chest pain, cough and shortness of breath.   patient has had right-sided chest pain for last couple days. Is constant. It is worse with movement or deep breathing. He states he is unable to bring this deeply daily. No fevers. His cough is nonproductive sputum. He states he is a chronic cough due to COPD. No trauma. He has not had pains like this before. No relief with his Norco 10 mg. It is in his right upper chest and radiates through to his back.  Past Medical History  Diagnosis Date  . Diabetes mellitus   . Nausea & vomiting   . Hypertension   . COPD (chronic obstructive pulmonary disease)   . Asthma   . GERD (gastroesophageal reflux disease)   . Osteopenia   . lung ca dx'd 01/2006    Past Surgical History  Procedure Date  . Lung cancer surgery 01/2006    Small excision of left lung tissue; mesh with radioactive seeds (per pt report)  . Cholecystectomy   . Tonsillectomy   . Angioplasty Approx 1993  . Cardiac catheterization Approximately 1993  . Fixation kyphoplasty     History reviewed. No pertinent family history.  History    Substance Use Topics  . Smoking status: Current Everyday Smoker -- 1.0 packs/day  . Smokeless tobacco: Never Used  . Alcohol Use: No     Seldom social (special occasions only)      Review of Systems  Constitutional: Negative for activity change and appetite change.  HENT: Negative for neck stiffness.   Eyes: Negative for pain.  Respiratory: Positive for cough and shortness of breath. Negative for chest tightness.   Cardiovascular: Positive for chest pain. Negative for leg swelling.  Gastrointestinal: Negative for nausea, vomiting, abdominal pain and diarrhea.  Genitourinary: Negative for flank pain.  Musculoskeletal: Negative for back pain.  Skin: Negative for rash.  Neurological: Negative for weakness, numbness and headaches.  Psychiatric/Behavioral: Negative for behavioral problems.    Allergies  Codeine and Phenobarbital  Home Medications   Current Outpatient Rx  Name Route Sig Dispense Refill  . ALBUTEROL SULFATE HFA 108 (90 BASE) MCG/ACT IN AERS Inhalation Inhale 2 puffs into the lungs every 6 (six) hours as needed.      Marland Kitchen ESOMEPRAZOLE MAGNESIUM 40 MG PO CPDR Oral Take 40 mg by mouth daily before breakfast.      . EZETIMIBE 10 MG PO TABS Oral Take 10 mg by mouth daily.      Marland Kitchen FEXOFENADINE HCL 180 MG PO TABS Oral Take 180 mg by mouth daily.      Marland Kitchen FLUTICASONE PROPIONATE 50 MCG/ACT NA SUSP Nasal Place 2 sprays into the nose daily.      Marland Kitchen  FLUTICASONE-SALMETEROL 250-50 MCG/DOSE IN AEPB Inhalation Inhale 1 puff into the lungs every 12 (twelve) hours.      Marland Kitchen GABAPENTIN 800 MG PO TABS Oral Take 800 mg by mouth 3 (three) times daily.     Marland Kitchen GLIMEPIRIDE 4 MG PO TABS Oral Take 4 mg by mouth 2 (two) times daily.      . GUAIFENESIN ER 600 MG PO TB12 Oral Take 1,200 mg by mouth daily.    Marland Kitchen HYDROCHLOROTHIAZIDE 12.5 MG PO TABS Oral Take 12.5 mg by mouth daily.      Marland Kitchen HYDROCODONE-ACETAMINOPHEN 10-325 MG PO TABS Oral Take 1 tablet by mouth 2 (two) times daily. Am and hs     . LISINOPRIL  40 MG PO TABS Oral Take 40 mg by mouth daily.      Marland Kitchen METFORMIN HCL 1000 MG PO TABS Oral Take 1,000 mg by mouth 2 (two) times daily with a meal.      . METOCLOPRAMIDE HCL 10 MG PO TABS Oral Take 10 mg by mouth 2 (two) times daily.      Marland Kitchen ONE-DAILY MULTI VITAMINS PO TABS Oral Take 1 tablet by mouth daily. Centrum silver     . CENTRUM PO CHEW Oral Chew 1 tablet by mouth daily.    Marland Kitchen PIOGLITAZONE HCL 45 MG PO TABS Oral Take 45 mg by mouth daily.      Marland Kitchen TIOTROPIUM BROMIDE MONOHYDRATE 18 MCG IN CAPS Inhalation Place 18 mcg into inhaler and inhale daily.        BP 129/59  Pulse 98  Temp(Src) 98.5 F (36.9 C) (Oral)  Resp 18  SpO2 95%  Physical Exam  Nursing note and vitals reviewed. Constitutional: He is oriented to person, place, and time. He appears well-developed and well-nourished.  HENT:  Head: Normocephalic and atraumatic.  Eyes: EOM are normal. Pupils are equal, round, and reactive to light.  Neck: Normal range of motion. Neck supple.  Cardiovascular: Normal rate, regular rhythm and normal heart sounds.   No murmur heard. Pulmonary/Chest: Effort normal. He exhibits tenderness.       Mildly harsh breath sounds. Tenderness to right upper anterior chest wall. No rash. Patient will be hypoxic to the upper 80s on room air  Abdominal: Soft. Bowel sounds are normal. He exhibits no distension and no mass. There is no tenderness. There is no rebound and no guarding.  Musculoskeletal: Normal range of motion. He exhibits no edema.  Neurological: He is alert and oriented to person, place, and time. No cranial nerve deficit.  Skin: Skin is warm and dry.  Psychiatric: He has a normal mood and affect.    ED Course  Procedures (including critical care time)  Labs Reviewed  CBC - Abnormal; Notable for the following:    WBC 11.1 (*)    All other components within normal limits  DIFFERENTIAL - Abnormal; Notable for the following:    Neutrophils Relative 82 (*)    Neutro Abs 9.1 (*)     Lymphocytes Relative 8 (*)    All other components within normal limits  POCT I-STAT, CHEM 8 - Abnormal; Notable for the following:    Glucose, Bld 210 (*)    All other components within normal limits  ANTITHROMBIN III  PROTEIN C ACTIVITY  PROTEIN C, TOTAL  PROTEIN S ACTIVITY  PROTEIN S, TOTAL  LUPUS ANTICOAGULANT PANEL  BETA-2-GLYCOPROTEIN I ABS, IGG/M/A  HOMOCYSTEINE, SERUM  FACTOR 5 LEIDEN  CARDIOLIPIN ANTIBODIES, IGG, IGM, IGA  PROTIME-INR  APTT   Dg  Ribs Unilateral W/chest Right  03/25/2012  *RADIOLOGY REPORT*  Clinical Data: Chest pain, rib pain  RIGHT RIBS AND CHEST - 3+ VIEW  Comparison: Chest x-ray 04/06/2009  Findings: Five views submitted for interpretation. Cardiomediastinal silhouette is stable.  Stable chronic interstitial prominence.  Stable postsurgical changes left mediastinum or paraspinal region.  No acute infiltrate or pulmonary edema.  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  No right rib fracture is identified. No diagnostic pneumothorax. Post cholecystectomy surgical clips are noted in the right upper abdomen.  IMPRESSION: No acute disease.  Stable chronic interstitial prominence.  No right rib fracture is identified.  No diagnostic pneumothorax.  Original Report Authenticated By: Lahoma Crocker, M.D.   Ct Angio Chest W/cm &/or Wo Cm  03/25/2012  *RADIOLOGY REPORT*  Clinical Data: Chest pain, cough, shortness of breath  CT ANGIOGRAPHY CHEST  Technique:  Multidetector CT imaging of the chest using the standard protocol during bolus administration of intravenous contrast. Multiplanar reconstructed images including MIPs were obtained and reviewed to evaluate the vascular anatomy.  Contrast: 120m OMNIPAQUE IOHEXOL 300 MG/ML  SOLN  Comparison: Chest radiographs dated 03/25/2012.  CT chest dated 01/10/2012.  Findings: Segmental/subsegmental pulmonary emboli in the left upper lobe (series 6/image 96), in the right middle lobe (series 6/image  145), and right lower lobe (series 6/image 157).  Overall clot burden is moderate.  Mild to moderate centrilobular emphysematous changes.  Postsurgical changes/volume loss in the left lower lobe with brachytherapy seeds.  Small right pleural effusion with associated right lower lobe opacity, likely atelectasis.  No pneumothorax.  Visualized thyroid is unremarkable.  The heart is normal in size.  No pericardial effusion.  Coronary atherosclerosis.  Sclerotic calcifications of the aortic arch.  Small mediastinal lymph nodes which do not meet pathologic CT size criteria.  No suspicious hilar or axillary lymphadenopathy.  Visualized upper abdomen is notable for a right adrenal myelolipoma and hepatic steatosis.  Degenerative changes of the visualized thoracolumbar spine.  No rib fracture is seen.  IMPRESSION: Bilateral segmental/subsegmental pulmonary emboli, as above. Overall clot burden is moderate.  Postsurgical changes/volume loss with brachytherapy seeds in the left lower lobe.  Small right pleural effusion with associated right lower lobe atelectasis.  Underlying centrilobular emphysematous changes.  Critical Value/emergent results were called by telephone at the time of interpretation on 03/25/2012  at 1425 hours  to  Dr. NDavonna Belling who verbally acknowledged these results.  Original Report Authenticated By: SJulian Hy M.D.     1. Acute pulmonary embolism      Date: 03/25/2012  Rate: 97  Rhythm: normal sinus rhythm  QRS Axis: normal  Intervals: normal  ST/T Wave abnormalities: norma  Conduction Disutrbances:nonspecific intraventricular conduction delay  Narrative Interpretation: Incomplete RBBB is new.  Old EKG Reviewed: changes noted    MDM  Patient came in with right upper chest pain and shortness of breath. Pleuritic chest pain worse with palpation. He is hypoxic on room air. CT chest was negative. CT angiogram showed bilateral pulmonary embolisms. Patient be admitted to  medicine. I discussed with Dr. MInda Merlinhe recommended a hypercoagulable workup.        NJasper Riling PAlvino Chapel MD 03/25/12 [phone]

## 2012-03-25 NOTE — ED Notes (Signed)
RT called for breathing tx.

## 2012-03-25 NOTE — ED Notes (Signed)
MD at bedside. 

## 2012-03-25 NOTE — ED Notes (Addendum)
Per pt report, pain is located primarily in the right anterior chest, radiating over the right clavicle as well as directly through to the right shoulder blade. Pain worsens with inspiration and movement. Pain at this time is rated 7-8/10 at this time and ranging intermittently from 5-10/10. Onset was Sunday, 03-23-12 in the evening. Pt also reports a productive cough with moderate dark green sputum that has been occuring for the past few weeks. Pt takes Mucinex for this, but no other tx.  Pt reports taking one Norco 10-325 at approximately 0500 today for chest pain.

## 2012-03-25 NOTE — Progress Notes (Signed)
Patient wants to use the bathroom refusing to use the urinal or the bedside commode, very persistent and  verbalized he cannot use the commode and been using  The bathroom in ED. Patient went ahead use the bathroom and had shortness of breath.Place O2 @ 2L via [place], t and stated feel better now.

## 2012-03-26 DIAGNOSIS — R079 Chest pain, unspecified: Secondary | ICD-10-CM | POA: Diagnosis not present

## 2012-03-26 DIAGNOSIS — I2699 Other pulmonary embolism without acute cor pulmonale: Secondary | ICD-10-CM

## 2012-03-26 DIAGNOSIS — I517 Cardiomegaly: Secondary | ICD-10-CM | POA: Diagnosis not present

## 2012-03-26 DIAGNOSIS — E118 Type 2 diabetes mellitus with unspecified complications: Secondary | ICD-10-CM | POA: Diagnosis not present

## 2012-03-26 DIAGNOSIS — E1165 Type 2 diabetes mellitus with hyperglycemia: Secondary | ICD-10-CM | POA: Diagnosis not present

## 2012-03-26 DIAGNOSIS — IMO0002 Reserved for concepts with insufficient information to code with codable children: Secondary | ICD-10-CM

## 2012-03-26 DIAGNOSIS — R0602 Shortness of breath: Secondary | ICD-10-CM

## 2012-03-26 LAB — LUPUS ANTICOAGULANT PANEL
DRVVT: 38.1 secs (ref <45.1)
Lupus Anticoagulant: NOT DETECTED
PTT Lupus Anticoagulant: 35.1 secs (ref 28.0–43.0)

## 2012-03-26 LAB — CARDIAC PANEL(CRET KIN+CKTOT+MB+TROPI)
CK, MB: 2.2 ng/mL (ref 0.3–4.0)
CK, MB: 2.3 ng/mL (ref 0.3–4.0)
Relative Index: INVALID (ref 0.0–2.5)
Relative Index: INVALID (ref 0.0–2.5)
Total CK: 57 U/L (ref 7–232)
Total CK: 53 U/L (ref 7–232)
Troponin I: <0.3 ng/mL (ref <0.30)
Troponin I: <0.3 ng/mL (ref <0.30)

## 2012-03-26 LAB — CBC
HCT: 37.8 % — ABNORMAL LOW (ref 39.0–52.0)
Hemoglobin: 12.9 g/dL — ABNORMAL LOW (ref 13.0–17.0)
MCH: 27.5 pg (ref 26.0–34.0)
MCHC: 34.1 g/dL (ref 30.0–36.0)
MCV: 80.6 fL (ref 78.0–100.0)
Platelets: 206 10*3/uL (ref 150–400)
RBC: 4.69 MIL/uL (ref 4.22–5.81)
RDW: 15 % (ref 11.5–15.5)
WBC: 8.5 10*3/uL (ref 4.0–10.5)

## 2012-03-26 LAB — GLUCOSE, CAPILLARY
Glucose-Capillary: 120 mg/dL — ABNORMAL HIGH (ref 70–99)
Glucose-Capillary: 155 mg/dL — ABNORMAL HIGH (ref 70–99)
Glucose-Capillary: 171 mg/dL — ABNORMAL HIGH (ref 70–99)
Glucose-Capillary: 175 mg/dL — ABNORMAL HIGH (ref 70–99)

## 2012-03-26 LAB — PROTIME-INR
INR: 0.99 (ref 0.00–1.49)
Prothrombin Time: 13.3 seconds (ref 11.6–15.2)

## 2012-03-26 LAB — PROTEIN C ACTIVITY: Protein C Activity: 138 % — ABNORMAL HIGH (ref 75–133)

## 2012-03-26 LAB — PROTEIN C, TOTAL: Protein C, Total: 79 % (ref 72–160)

## 2012-03-26 LAB — PROTEIN S ACTIVITY: Protein S Activity: 79 % (ref 69–129)

## 2012-03-26 LAB — FACTOR 5 LEIDEN

## 2012-03-26 LAB — PROTEIN S, TOTAL: Protein S Ag, Total: 74 % (ref 60–150)

## 2012-03-26 LAB — ANTITHROMBIN III: AntiThromb III Func: 126 % — ABNORMAL HIGH (ref 76–126)

## 2012-03-26 MED ORDER — WARFARIN SODIUM 7.5 MG PO TABS
7.5000 mg | ORAL_TABLET | Freq: Once | ORAL | Status: DC
  Filled 2012-03-26: qty 1

## 2012-03-26 MED ORDER — RIVAROXABAN 15 MG PO TABS
15.0000 mg | ORAL_TABLET | Freq: Two times a day (BID) | ORAL | Status: DC
  Administered 2012-03-26 – 2012-03-28 (×4): 15 mg via ORAL
  Filled 2012-03-26 (×8): qty 1

## 2012-03-26 MED ORDER — LORAZEPAM 1 MG PO TABS
1.0000 mg | ORAL_TABLET | Freq: Four times a day (QID) | ORAL | Status: DC | PRN
  Administered 2012-03-26 – 2012-03-27 (×5): 1 mg via ORAL
  Filled 2012-03-26 (×5): qty 1

## 2012-03-26 MED ORDER — ALBUTEROL SULFATE HFA 108 (90 BASE) MCG/ACT IN AERS
2.0000 | INHALATION_SPRAY | RESPIRATORY_TRACT | Status: DC | PRN
  Filled 2012-03-26: qty 6.7

## 2012-03-26 NOTE — Progress Notes (Signed)
Pt is refusing the Albuterol HFA MDI ordered QID He states he only uses hi ProAir MDI AS NEEDED and doesn't need it at this time.

## 2012-03-26 NOTE — Progress Notes (Signed)
  Echocardiogram 2D Echocardiogram has been performed.  Paul [name] [facility] 03/26/2012, 11:23 AM

## 2012-03-26 NOTE — Progress Notes (Signed)
Pt has his albuterol mdi at bedside and verbalized to me (RT) that he uses it when needed.  Pt advised that orders received by MD, and that RT will offer/give him his treatments.  Pt encouraged not to take his home meds while here in the hospital.  Pt became upset and verbalized, "I would be dead by the time someone came to give him his medicine". Pt persistent on keeping his mdi at bedside.  Pt reassured that his nurse/RT available all day/night should he need any assistance.  Also, expressed to the patient the importance of his doctors/care team being aware of how often he is needing to take his mdi.  RN notified.

## 2012-03-26 NOTE — Progress Notes (Signed)
Subjective: Patient complaining of shortness of breath especially worse with exertion or activity.  Objective: Vital signs in last 24 hours: Filed Vitals:   03/25/12 2053 03/26/12 0435 03/26/12 0814 03/26/12 1356  BP: 154/81 145/84  142/78  Pulse: 87 82  77  Temp: 97.7 F (36.5 C) 99.2 F (37.3 C)  97.4 F (36.3 C)  TempSrc: Oral Oral  Oral  Resp: _0 Height:      Weight:      SpO2: 92% 93% 94% 95%   Weight change:   Intake/Output Summary (Last 24 hours) at 03/26/12 1520 Last data filed at 03/26/12 1100  Gross per 24 hour  Intake 1032.5 ml  Output   1875 ml  Net -842.5 ml    Physical Exam: General: Awake, Oriented, No acute distress. HEENT: EOMI. Neck: Supple CV: S1 and S2 Lungs: Clear to ascultation bilaterally Abdomen: Soft, Nontender, Nondistended, +bowel sounds. Ext: Good pulses. Trace edema.  Lab Results:  Basename 03/25/12 1220  NA 135  K 3.8  CL 107  CO2 --  GLUCOSE 210*  BUN 20  CREATININE 0.90  CALCIUM --  MG --  PHOS --   No results found for this basename: AST:2,ALT:2,ALKPHOS:2,BILITOT:2,PROT:2,ALBUMIN:2 in the last 72 hours No results found for this basename: LIPASE:2,AMYLASE:2 in the last 72 hours  Basename 03/26/12 0140 03/25/12 1220 03/25/12 1155  WBC 8.5 -- 11.1*  NEUTROABS -- -- 9.1*  HGB 12.9* 13.3 --  HCT 37.8* 39.0 --  MCV 80.6 -- 80.7  PLT 206 -- 210    Basename 03/26/12 0835 03/26/12 0140 03/25/12 1820  CKTOTAL 53 57 62  CKMB 2.3 2.2 2.8  CKMBINDEX -- -- --  TROPONINI <0.30 <0.30 <0.30   No components found with this basename: POCBNP:3 No results found for this basename: DDIMER:2 in the last 72 hours  Basename 03/25/12 1820  HGBA1C 7.1*   No results found for this basename: CHOL:2,HDL:2,LDLCALC:2,TRIG:2,CHOLHDL:2,LDLDIRECT:2 in the last 72 hours No results found for this basename: TSH,T4TOTAL,FREET3,T3FREE,THYROIDAB in the last 72 hours No results found for this basename:  VITAMINB12:2,FOLATE:2,FERRITIN:2,TIBC:2,IRON:2,RETICCTPCT:2 in the last 72 hours  Micro Results: No results found for this or any previous visit (from the past 240 hour(s)).  Studies/Results: Dg Ribs Unilateral W/chest Right  03/25/2012  *RADIOLOGY REPORT*  Clinical Data: Chest pain, rib pain  RIGHT RIBS AND CHEST - 3+ VIEW  Comparison: Chest x-ray 04/06/2009  Findings: Five views submitted for interpretation. Cardiomediastinal silhouette is stable.  Stable chronic interstitial prominence.  Stable postsurgical changes left mediastinum or paraspinal region.  No acute infiltrate or pulmonary edema.  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  No right rib fracture is identified. No diagnostic pneumothorax. Post cholecystectomy surgical clips are noted in the right upper abdomen.  IMPRESSION: No acute disease.  Stable chronic interstitial prominence.  No right rib fracture is identified.  No diagnostic pneumothorax.  Original Report Authenticated By: Lahoma Crocker, M.D.   Ct Angio Chest W/cm &/or Wo Cm  03/25/2012  *RADIOLOGY REPORT*  Clinical Data: Chest pain, cough, shortness of breath  CT ANGIOGRAPHY CHEST  Technique:  Multidetector CT imaging of the chest using the standard protocol during bolus administration of intravenous contrast. Multiplanar reconstructed images including MIPs were obtained and reviewed to evaluate the vascular anatomy.  Contrast: 119m OMNIPAQUE IOHEXOL 300 MG/ML  SOLN  Comparison: Chest radiographs dated 03/25/2012.  CT chest dated 01/10/2012.  Findings: Segmental/subsegmental pulmonary emboli in the left upper lobe (series 6/image 96), in the right  middle lobe (series 6/image 145), and right lower lobe (series 6/image 157).  Overall clot burden is moderate.  Mild to moderate centrilobular emphysematous changes.  Postsurgical changes/volume loss in the left lower lobe with brachytherapy seeds.  Small right pleural effusion with associated right lower  lobe opacity, likely atelectasis.  No pneumothorax.  Visualized thyroid is unremarkable.  The heart is normal in size.  No pericardial effusion.  Coronary atherosclerosis.  Sclerotic calcifications of the aortic arch.  Small mediastinal lymph nodes which do not meet pathologic CT size criteria.  No suspicious hilar or axillary lymphadenopathy.  Visualized upper abdomen is notable for a right adrenal myelolipoma and hepatic steatosis.  Degenerative changes of the visualized thoracolumbar spine.  No rib fracture is seen.  IMPRESSION: Bilateral segmental/subsegmental pulmonary emboli, as above. Overall clot burden is moderate.  Postsurgical changes/volume loss with brachytherapy seeds in the left lower lobe.  Small right pleural effusion with associated right lower lobe atelectasis.  Underlying centrilobular emphysematous changes.  Critical Value/emergent results were called by telephone at the time of interpretation on 03/25/2012  at 1425 hours  to  Dr. Davonna Belling, who verbally acknowledged these results.  Original Report Authenticated By: Julian Hy, M.D.   2-D echocardiogram on 03/26/2012 Study Conclusions - Left ventricle: Technically limited images. The cavity size was normal. Wall thickness was increased in a pattern of mild LVH. The estimated ejection fraction was 60%. Regional wall motion abnormalities cannot be excluded. - Left atrium: The atrium was mildly dilated. - Right ventricle: Poorly visualized. Overall RV function probably OK. RV pressure can not be estimated.  Medications: I have reviewed the patient's current medications. Scheduled Meds:   . albuterol  2 puff Inhalation QID  . coumadin book   Does not apply Once  . docusate sodium  100 mg Oral BID  . ezetimibe  10 mg Oral Daily  . fluticasone  2 spray Each Nare Daily  . Fluticasone-Salmeterol  1 puff Inhalation Q12H  . gabapentin  800 mg Oral TID  . glimepiride  4 mg Oral BID WC  . guaiFENesin  1,200 mg Oral Daily  .  hydrochlorothiazide  12.5 mg Oral Daily  . insulin aspart  0-15 Units Subcutaneous TID WC  . loratadine  10 mg Oral Daily  . metoCLOPramide  10 mg Oral BID AC  . pantoprazole  40 mg Oral Q1200  . pioglitazone  45 mg Oral Q breakfast  . rivaroxaban  15 mg Oral BID  . senna  1 tablet Oral BID  . sodium chloride  3 mL Intravenous Q12H  . tiotropium  18 mcg Inhalation Daily  . warfarin  7.5 mg Oral ONCE-1800  . warfarin  7.5 mg Oral ONCE-1800  . DISCONTD: albuterol  2 puff Inhalation QID  . DISCONTD: enoxaparin (LOVENOX) injection  110 mg Subcutaneous Q12H  . DISCONTD: enoxaparin (LOVENOX) injection  120 mg Subcutaneous Q12H  . DISCONTD: ipratropium  0.5 mg Nebulization Q6H  . DISCONTD: warfarin   Does not apply Once  . DISCONTD: Warfarin - Pharmacist Dosing Inpatient   Does not apply q1800   Continuous Infusions:   . DISCONTD: sodium chloride 50 mL/hr at 03/26/12 1246   PRN Meds:.albuterol, HYDROmorphone (DILAUDID) injection, LORazepam, ondansetron (ZOFRAN) IV, ondansetron, oxyCODONE  Assessment/Plan: Acute pulmonary embolism Discontinue Coumadin and Lovenox.  Start the patient on Rivaroxaban (given no evidence of recurrence of malignancy) at patient's request.  Troponins negative x3.  2-D echocardiogram on 03/26/2012 chart cavity size was normal, wall thickness was increased in  a pattern of mild LVH, ejection fraction 60%, right ventricle poorly visualized, overall function okay.  Will request PT/OT evaluation.  Will do a walking desaturation tomorrow.  Hypercoagulable workup pending, homocysteine normal.  Lupus anticoagulant normal.  Protein C and S total normal, Protein C activity abnormal likely due to acute pulmonary embolism.  Cardiolipin antibody and beta-2 glycoprotein antibody pending.  Will discuss with Dr. Julien Nordmann given history of cancer prior to discharge.  Shortness of breath Secondary to pulmonary embolism.  COPD (chronic obstructive pulmonary disease) Stable.  Continue  home inhalers.  DM (diabetes mellitus), type 2, uncontrolled with complications Metformin held.  Hemoglobin A1c on 03/25/2012 was 7.1 suggesting average blood sugar of 157.  Continue glimepiride and pioglitazone.  Sliding scale insulin.  Anxiety Start when necessary Ativan.  History of non-small cell lung cancer, adenocarcinoma with bronchial alveolar features Management as per Dr. Julien Nordmann.  Prophylaxis Rivaroxaban.  Disposition Pending PT/OT.   LOS: 1 day  Chlora Mcbain A, MD 03/26/2012, 3:20 PM

## 2012-03-26 NOTE — Care Management Note (Unsigned)
Page 1 of 1   03/27/2012     4:49:24 PM   CARE MANAGEMENT NOTE 03/27/2012  Patient:  Paul [name], Paul [name]   Account Number:  [id number]  Date Initiated:  03/26/2012  Documentation initiated by:  Dessa Phi  Subjective/Objective Assessment:   ADMITTED W/[id number] CA.     Action/Plan:   FROM HOME W/SPOUSE.HAS PCP,PHARMACY-[facility]   Anticipated DC Date:  03/31/2012   Anticipated DC Plan:  [place]  CM consult      Choice offered to / List presented to:             Status of service:  In process, will continue to follow Medicare Important Message given?   (If response is "NO", the following Medicare IM given date fields will be blank) Date Medicare IM given:   Date Additional Medicare IM given:    Discharge Disposition:    Per UR Regulation:  Reviewed for med. necessity/level of care/duration of stay  If discussed at [place] of Stay Meetings, dates discussed:    Comments:  03/27/12 Laconya Clere RN,BSN NCM [place] [facility] DME FOLLOWING.PT/OT-NA.  03/26/12 Elgie Landino RN,BSN NCM [phone]

## 2012-03-26 NOTE — Progress Notes (Signed)
ANTICOAGULATION CONSULT NOTE - Follow up [place] for Warfarin, Lovenox Indication: pulmonary embolus  Allergies  Allergen Reactions  . Codeine Anaphylaxis  . Phenobarbital Hypertension    Patient Measurements: Height: 5' 7.5" (171.5 cm) Weight: 243 lb 12.8 oz (110.587 kg) IBW/kg (Calculated) : 67.25   Vital Signs: Temp: 99.2 F (37.3 C) (05/15 0435) Temp src: Oral (05/15 0435) BP: 145/84 mmHg (05/15 0435) Pulse Rate: 82  (05/15 0435)  Labs:  Basename 03/26/12 0835 03/26/12 0140 03/25/12 1820 03/25/12 1525 03/25/12 1220 03/25/12 1155  HGB -- 12.9* -- -- 13.3 --  HCT -- 37.8* -- -- 39.0 39.2  PLT -- 206 -- -- -- 210  APTT -- -- -- 27 -- --  LABPROT -- 13.3 -- 12.9 -- --  INR -- 0.99 -- 0.95 -- --  HEPARINUNFRC -- -- -- -- -- --  CREATININE -- -- -- -- 0.90 --  CKTOTAL 53 57 62 -- -- --  CKMB 2.3 2.2 2.8 -- -- --  TROPONINI <0.30 <0.30 <0.30 -- -- --   CrCl 75 ml/min   Medical History: Past Medical History  Diagnosis Date  . Diabetes mellitus   . Nausea & vomiting   . Hypertension   . COPD (chronic obstructive pulmonary disease)   . Asthma   . GERD (gastroesophageal reflux disease)   . Osteopenia   . lung ca dx'd 01/2006    Medications:  Scheduled:     . albuterol  2 puff Inhalation QID  . albuterol  5 mg Nebulization Once  . coumadin book   Does not apply Once  . docusate sodium  100 mg Oral BID  . enoxaparin (LOVENOX) injection  110 mg Subcutaneous Q12H  . ezetimibe  10 mg Oral Daily  . fluticasone  2 spray Each Nare Daily  . Fluticasone-Salmeterol  1 puff Inhalation Q12H  . gabapentin  800 mg Oral TID  . glimepiride  4 mg Oral BID WC  . guaiFENesin  1,200 mg Oral Daily  . hydrochlorothiazide  12.5 mg Oral Daily  .  HYDROmorphone (DILAUDID) injection  1 mg Intravenous Once  . insulin aspart  0-15 Units Subcutaneous TID WC  . ipratropium  0.5 mg Nebulization Once  . loratadine  10 mg Oral Daily  . metoCLOPramide  10 mg Oral BID  AC  . pantoprazole  40 mg Oral Q1200  . pioglitazone  45 mg Oral Q breakfast  . senna  1 tablet Oral BID  . sodium chloride  500 mL Intravenous Once  . sodium chloride  3 mL Intravenous Q12H  . tiotropium  18 mcg Inhalation Daily  . warfarin  7.5 mg Oral ONCE-1800  . warfarin   Does not apply Once  . Warfarin - Pharmacist Dosing Inpatient   Does not apply q1800  . DISCONTD: albuterol  2 puff Inhalation QID  . DISCONTD: enoxaparin (LOVENOX) injection  120 mg Subcutaneous Q12H  . DISCONTD: ipratropium  0.5 mg Nebulization Q6H    Assessment:  86 yom w/ hx non-small cell lung cancer (diagnosed 01/2006) w/ new PE  Day #2/5 minimum overlap for Lovenox and warfarin (will also need 2 day overlap with therapeutic INR, whichever is longer)  INR subtherapeutic as expected  CBC stable, no complications reported  Goal of Therapy:  INR 2-3 Anti-Xa level 0.6-1.2 units/ml 4hrs after LMWH dose given Monitor platelets by anticoagulation protocol: Yes   Plan:   Continue Lovenox 110 mg SQ q12h   Consider anti-Xa level at steady state  given obesity  Warfarin 7.53m PO tonight  Daily INR  Warfarin education before d/c  Given CA diagnosis would recommend considering Lovenox alone x 6 months as per CHEST Guidelines for treatment of VTE + CA diagnosis. Noted that patient is currently not receiving tx per Dr MJulien Nordmannand is under continuous observation    CGretta ArabPharmD, BCPS Pager [phone]/15/2013 12:51 PM

## 2012-03-27 DIAGNOSIS — I480 Paroxysmal atrial fibrillation: Secondary | ICD-10-CM | POA: Diagnosis not present

## 2012-03-27 DIAGNOSIS — E1165 Type 2 diabetes mellitus with hyperglycemia: Secondary | ICD-10-CM | POA: Diagnosis not present

## 2012-03-27 DIAGNOSIS — E118 Type 2 diabetes mellitus with unspecified complications: Secondary | ICD-10-CM

## 2012-03-27 DIAGNOSIS — R0602 Shortness of breath: Secondary | ICD-10-CM | POA: Diagnosis not present

## 2012-03-27 DIAGNOSIS — I4891 Unspecified atrial fibrillation: Secondary | ICD-10-CM | POA: Diagnosis not present

## 2012-03-27 DIAGNOSIS — I2510 Atherosclerotic heart disease of native coronary artery without angina pectoris: Secondary | ICD-10-CM | POA: Diagnosis not present

## 2012-03-27 DIAGNOSIS — I2699 Other pulmonary embolism without acute cor pulmonale: Secondary | ICD-10-CM | POA: Diagnosis not present

## 2012-03-27 LAB — CBC
HCT: 39.9 % (ref 39.0–52.0)
Hemoglobin: 13.3 g/dL (ref 13.0–17.0)
MCH: 26.8 pg (ref 26.0–34.0)
MCHC: 33.3 g/dL (ref 30.0–36.0)
MCV: 80.4 fL (ref 78.0–100.0)
Platelets: 236 10*3/uL (ref 150–400)
RBC: 4.96 MIL/uL (ref 4.22–5.81)
RDW: 14.6 % (ref 11.5–15.5)
WBC: 8.3 10*3/uL (ref 4.0–10.5)

## 2012-03-27 LAB — GLUCOSE, CAPILLARY
Glucose-Capillary: 150 mg/dL — ABNORMAL HIGH (ref 70–99)
Glucose-Capillary: 130 mg/dL — ABNORMAL HIGH (ref 70–99)
Glucose-Capillary: 146 mg/dL — ABNORMAL HIGH (ref 70–99)
Glucose-Capillary: 207 mg/dL — ABNORMAL HIGH (ref 70–99)
Glucose-Capillary: 120 mg/dL — ABNORMAL HIGH (ref 70–99)

## 2012-03-27 LAB — BASIC METABOLIC PANEL
BUN: 12 mg/dL (ref 6–23)
CO2: 24 mEq/L (ref 19–32)
Calcium: 9.4 mg/dL (ref 8.4–10.5)
Chloride: 103 mEq/L (ref 96–112)
Creatinine, Ser: 0.66 mg/dL (ref 0.50–1.35)
GFR calc Af Amer: >90 mL/min (ref >90)
GFR calc non Af Amer: >90 mL/min (ref >90)
Glucose, Bld: 144 mg/dL — ABNORMAL HIGH (ref 70–99)
Potassium: 3.8 mEq/L (ref 3.5–5.1)
Sodium: 137 mEq/L (ref 135–145)

## 2012-03-27 MED ORDER — DILTIAZEM LOAD VIA INFUSION
10.0000 mg | Freq: Once | INTRAVENOUS | Status: AC
  Filled 2012-03-27: qty 10

## 2012-03-27 MED ORDER — METOPROLOL TARTRATE 25 MG PO TABS
25.0000 mg | ORAL_TABLET | Freq: Two times a day (BID) | ORAL | Status: DC
  Filled 2012-03-27 (×2): qty 1

## 2012-03-27 MED ORDER — GABAPENTIN 400 MG PO CAPS
2400.0000 mg | ORAL_CAPSULE | Freq: Every day | ORAL | Status: DC
  Administered 2012-03-27: 2400 mg via ORAL
  Filled 2012-03-27 (×2): qty 6

## 2012-03-27 MED ORDER — GABAPENTIN 400 MG PO CAPS
800.0000 mg | ORAL_CAPSULE | Freq: Three times a day (TID) | ORAL | Status: DC
  Filled 2012-03-27 (×3): qty 2

## 2012-03-27 MED ORDER — DILTIAZEM HCL 100 MG IV SOLR
5.0000 mg/h | INTRAVENOUS | Status: AC
  Administered 2012-03-27 – 2012-03-28 (×2): 5 mg/h via INTRAVENOUS
  Filled 2012-03-27 (×2): qty 100

## 2012-03-27 NOTE — Progress Notes (Signed)
OT Note:  Pt was mod I with PT.  Pt reports he got himself washed this am without problems. Reviewed energy conservation principles and gave him a handout:  Pt has tub with bars and will sponge bathe initially; he has 3:1 if needed. Did not complete full OT eval.    Screen only.  [place], OTR/L [phone] 03/27/2012

## 2012-03-27 NOTE — Evaluation (Signed)
Physical Therapy Evaluation Patient Details Name: Paul [name] MRN: [id number] DOB: 16-May-1941 Today's Date: 03/27/2012 Time: 5597-4163 PT Time Calculation (min): 18 min  PT Assessment / Plan / Recommendation Clinical Impression  Pt presents with diagnosis of acute PE. Mobilizing well. Requires O2 for activity. Did not ambulate pt on RA due to pt reporting he already walked with nursing earlier and he needed O2. O2 sats dropped to 88% on 3L O2 during PT session.1x eval. Do not anticipate any further PT needs at this time. Pt raised question of smoking and O2 use - instructed pt NOT to smoke while around/using O2 or with O2 flowing for safety reasons. Also encouraged pt to limit stair climbing (1-2x daily) until tolerance,breathing improves.     PT Assessment  Patent does not need any further PT services    Follow Up Recommendations  No PT follow up    Barriers to Discharge        lEquipment Recommendations  None recommended by PT    Recommendations for Other Services OT consult   Frequency      Precautions / Restrictions Precautions Precautions: Fall Restrictions Weight Bearing Restrictions: No   Pertinent Vitals/Pain 95% 3L O2 at rest, 88% 3L O2 with activity      Mobility  Bed Mobility Bed Mobility: Not assessed Details for Bed Mobility Assistance: pt sitting eob at start of session Transfers Transfers: Sit to Stand;Stand to Sit Sit to Stand: 6: Modified independent (Device/Increase time);With upper extremity assist;From bed;From chair/3-in-1 Stand to Sit: 6: Modified independent (Device/Increase time);With upper extremity assist;To chair/3-in-1;To bed Ambulation/Gait Ambulation/Gait Assistance: 6: Modified independent (Device/Increase time) Ambulation Distance (Feet): 200 Feet Assistive device: None Ambulation/Gait Assistance Details: pt able to ambulate and push O2 tank. no LOB. Dypnea 2/4 with ambulation, 3/4 with ambulation and stairs. O2: 88% on 3L O2 at end of  session Gait Pattern: Within Functional Limits Stairs: Yes Stairs Assistance: 6: Modified independent (Device/Increase time) Stair Management Technique: Two rails;Forwards;Step to pattern Number of Stairs: 9     Exercises     PT Diagnosis:    PT Problem List:   PT Treatment Interventions:     PT Goals    Visit Information  Last PT Received On: 03/27/12 Assistance Needed: +1    Subjective Data  Subjective: "I'm gonna have to go home with oxygen" Patient Stated Goal: Home   Prior [place] Lives With: Spouse Available Help at Discharge: Family Type of Home: House Home Access: Stairs to enter Technical brewer of Steps: 3 Entrance Stairs-Rails: Right;Left;Can reach both Home Layout: Two level Alternate Level Stairs-Number of Steps: 14 Alternate Level Stairs-Rails: Right;Left Prior Function Level of Independence: Independent Driving: Yes Communication Communication: No difficulties    Cognition  Overall Cognitive Status: Appears within functional limits for tasks assessed/performed Arousal/Alertness: Awake/alert Orientation Level: Appears intact for tasks assessed Behavior During Session: [facility] for tasks performed    Extremity/Trunk Assessment Right Lower Extremity Assessment RLE ROM/Strength/Tone: [facility] for tasks assessed RLE Coordination: WFL - gross motor Left Lower Extremity Assessment LLE ROM/Strength/Tone: WFL for tasks assessed LLE Coordination: WFL - gross motor Trunk Assessment Trunk Assessment: Kyphotic   Balance    End of Session PT - End of Session Equipment Utilized During Treatment: Gait belt;Oxygen Activity Tolerance: Patient limited by fatigue Patient left: in bed;with call bell/phone within reach (sitting eob)   Weston Anna [facility] 03/27/2012, 9:34 AM [phone]

## 2012-03-27 NOTE — Progress Notes (Signed)
Subjective: Patient tachycardic today, went into A. fib with RVR.  Patient wondering when he can be discharged.  Objective: Vital signs in last 24 hours: Filed Vitals:   03/26/12 1920 03/26/12 2137 03/27/12 0703 03/27/12 0727  BP:  159/84 121/71   Pulse:  86 86   Temp:  98.1 F (36.7 C) 97.9 F (36.6 C)   TempSrc:  Oral Oral   Resp:  20 19   Height:      Weight:      SpO2: 95% 92% 94% 93%   Weight change:   Intake/Output Summary (Last 24 hours) at 03/27/12 1404 Last data filed at 03/27/12 1100  Gross per 24 hour  Intake    480 ml  Output   1926 ml  Net  -1446 ml    Physical Exam: General: Awake, Oriented, No acute distress. HEENT: EOMI. Neck: Supple CV: S1 and S2, tachycardic, irregularly irregular Lungs: Clear to ascultation bilaterally Abdomen: Soft, Nontender, Nondistended, +bowel sounds. Ext: Good pulses. Trace edema.  Lab Results:  Basename 03/27/12 0520 03/25/12 1220  NA 137 135  K 3.8 3.8  CL 103 107  CO2 24 --  GLUCOSE 144* 210*  BUN 12 20  CREATININE 0.66 0.90  CALCIUM 9.4 --  MG -- --  PHOS -- --   No results found for this basename: AST:2,ALT:2,ALKPHOS:2,BILITOT:2,PROT:2,ALBUMIN:2 in the last 72 hours No results found for this basename: LIPASE:2,AMYLASE:2 in the last 72 hours  Basename 03/27/12 0520 03/26/12 0140 03/25/12 1155  WBC 8.3 8.5 --  NEUTROABS -- -- 9.1*  HGB 13.3 12.9* --  HCT 39.9 37.8* --  MCV 80.4 80.6 --  PLT 236 206 --    Basename 03/26/12 0835 03/26/12 0140 03/25/12 1820  CKTOTAL 53 57 62  CKMB 2.3 2.2 2.8  CKMBINDEX -- -- --  TROPONINI <0.30 <0.30 <0.30   No components found with this basename: POCBNP:3 No results found for this basename: DDIMER:2 in the last 72 hours  Basename 03/25/12 1820  HGBA1C 7.1*   No results found for this basename: CHOL:2,HDL:2,LDLCALC:2,TRIG:2,CHOLHDL:2,LDLDIRECT:2 in the last 72 hours No results found for this basename: TSH,T4TOTAL,FREET3,T3FREE,THYROIDAB in the last 72 hours No  results found for this basename: VITAMINB12:2,FOLATE:2,FERRITIN:2,TIBC:2,IRON:2,RETICCTPCT:2 in the last 72 hours  Micro Results: No results found for this or any previous visit (from the past 240 hour(s)).  Studies/Results: Ct Angio Chest W/cm &/or Wo Cm  03/25/2012  *RADIOLOGY REPORT*  Clinical Data: Chest pain, cough, shortness of breath  CT ANGIOGRAPHY CHEST  Technique:  Multidetector CT imaging of the chest using the standard protocol during bolus administration of intravenous contrast. Multiplanar reconstructed images including MIPs were obtained and reviewed to evaluate the vascular anatomy.  Contrast: 170m OMNIPAQUE IOHEXOL 300 MG/ML  SOLN  Comparison: Chest radiographs dated 03/25/2012.  CT chest dated 01/10/2012.  Findings: Segmental/subsegmental pulmonary emboli in the left upper lobe (series 6/image 96), in the right middle lobe (series 6/image 145), and right lower lobe (series 6/image 157).  Overall clot burden is moderate.  Mild to moderate centrilobular emphysematous changes.  Postsurgical changes/volume loss in the left lower lobe with brachytherapy seeds.  Small right pleural effusion with associated right lower lobe opacity, likely atelectasis.  No pneumothorax.  Visualized thyroid is unremarkable.  The heart is normal in size.  No pericardial effusion.  Coronary atherosclerosis.  Sclerotic calcifications of the aortic arch.  Small mediastinal lymph nodes which do not meet pathologic CT size criteria.  No suspicious hilar or axillary lymphadenopathy.  Visualized upper abdomen is  notable for a right adrenal myelolipoma and hepatic steatosis.  Degenerative changes of the visualized thoracolumbar spine.  No rib fracture is seen.  IMPRESSION: Bilateral segmental/subsegmental pulmonary emboli, as above. Overall clot burden is moderate.  Postsurgical changes/volume loss with brachytherapy seeds in the left lower lobe.  Small right pleural effusion with associated right lower lobe atelectasis.   Underlying centrilobular emphysematous changes.  Critical Value/emergent results were called by telephone at the time of interpretation on 03/25/2012  at 1425 hours  to  Dr. Davonna Belling, who verbally acknowledged these results.  Original Report Authenticated By: Julian Hy, M.D.    Medications: I have reviewed the patient's current medications. Scheduled Meds:    . docusate sodium  100 mg Oral BID  . ezetimibe  10 mg Oral Daily  . fluticasone  2 spray Each Nare Daily  . Fluticasone-Salmeterol  1 puff Inhalation Q12H  . gabapentin  800 mg Oral TID  . glimepiride  4 mg Oral BID WC  . guaiFENesin  1,200 mg Oral Daily  . hydrochlorothiazide  12.5 mg Oral Daily  . insulin aspart  0-15 Units Subcutaneous TID WC  . loratadine  10 mg Oral Daily  . metoCLOPramide  10 mg Oral BID AC  . pantoprazole  40 mg Oral Q1200  . pioglitazone  45 mg Oral Q breakfast  . rivaroxaban  15 mg Oral BID  . senna  1 tablet Oral BID  . sodium chloride  3 mL Intravenous Q12H  . tiotropium  18 mcg Inhalation Daily  . DISCONTD: albuterol  2 puff Inhalation QID  . DISCONTD: enoxaparin (LOVENOX) injection  110 mg Subcutaneous Q12H  . DISCONTD: gabapentin  800 mg Oral TID  . DISCONTD: warfarin  7.5 mg Oral ONCE-1800  . DISCONTD: warfarin   Does not apply Once  . DISCONTD: Warfarin - Pharmacist Dosing Inpatient   Does not apply q1800   Continuous Infusions:    . DISCONTD: sodium chloride 50 mL/hr at 03/26/12 1246   PRN Meds:.albuterol, HYDROmorphone (DILAUDID) injection, LORazepam, ondansetron (ZOFRAN) IV, ondansetron, oxyCODONE, DISCONTD: albuterol  Assessment/Plan: Acute pulmonary embolism Discontinue Coumadin and Lovenox.  Continue Rivaroxaban (given no evidence of recurrence of malignancy) at patient's request, discussed with Dr. Julien Nordmann who agreed with Rivaroxaban.  Troponins negative x3.  2-D echocardiogram on 03/26/2012 chart cavity size was normal, wall thickness was increased in a pattern of  mild LVH, ejection fraction 60%, right ventricle poorly visualized, overall function okay.  No PT/OT needs.  Hypercoagulable workup pending, homocysteine normal.  Lupus anticoagulant normal.  Protein C and S total normal, Protein C activity abnormal likely due to acute pulmonary embolism.  Cardiolipin antibody and beta-2 glycoprotein antibody pending.  AntiThrombin III upper end of normal at 126.  A. fib with RVR Rate not well controlled.  Patient started on diltiazem.  Cardiology input appreciated.  2-D echocardiogram done as indicated above.  Patient anticoagulated on Rivaroxaban.  Shortness of breath Secondary to pulmonary embolism.  Improved.  COPD (chronic obstructive pulmonary disease) Stable.  Continue home inhalers.  DM (diabetes mellitus), type 2, uncontrolled with complications Metformin held.  Hemoglobin A1c on 03/25/2012 was 7.1 suggesting average blood sugar of 157.  Continue glimepiride and pioglitazone.  Sliding scale insulin.  Anxiety Continue PRN Ativan.  History of non-small cell lung cancer, adenocarcinoma with bronchial alveolar features Management as per Dr. Julien Nordmann.  Prophylaxis Rivaroxaban.  Disposition Pending improvement in heart rate.   LOS: 2 days  Malakhai Beitler A, MD 03/27/2012, 2:04 PM

## 2012-03-27 NOTE — Consult Note (Signed)
Reason for Consult: Atrial Fib with RVR Referring Physician: [name] is an 71 y.o. male.  HPI:   Patient is a 71 year old morbidly obese Caucasian male with a history of coronary artery disease and apparently had a 95% blockage back in the early 90s and one of his coronary arteries. 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  Patient was in the process of being discharged today being treated for pulmonary embolism with Xarelto when he converted to atrial fibrillation with a rapid ventricular response. Patient states that he has no noticeable difference in how he feels. He denies chest pain, shortness of breath above his baseline, palpitations, orthopnea, dizziness, PND, lower extremity edema, nausea, vomiting, congestion, abdominal pain, hematuria, dysuria, hematochezia, melena.  2-D echocardiogram performed this admission showed an ejection fraction of 60% mild LVH. Wall motion abnormalities cannot be excluded. The right ventricle was poorly visualized.    Past Medical History  Diagnosis Date  . Diabetes mellitus   . Nausea & vomiting   . Hypertension   . COPD (chronic obstructive pulmonary disease)   . Asthma   . GERD (gastroesophageal reflux disease)   . Osteopenia   . lung ca dx'd 01/2006    Past Surgical History  Procedure Date  . Lung cancer surgery 01/2006    Small excision of left lung tissue; mesh with radioactive seeds (per pt report)  . Cholecystectomy   . Tonsillectomy   . Angioplasty Approx 1993  . Cardiac catheterization Approximately 1993  . Fixation kyphoplasty     History reviewed. No pertinent family history.  Social History:  reports that he has been smoking.  He has never used smokeless tobacco. He reports that he does not drink alcohol or use illicit drugs.  Allergies:  Allergies   Allergen Reactions  . Codeine Anaphylaxis  . Phenobarbital Hypertension    Medications:    . diltiazem  10 mg Intravenous Once  . docusate sodium  100 mg Oral BID  . ezetimibe  10 mg Oral Daily  . fluticasone  2 spray Each Nare Daily  . Fluticasone-Salmeterol  1 puff Inhalation Q12H  . gabapentin  800 mg Oral TID  . glimepiride  4 mg Oral BID WC  . guaiFENesin  1,200 mg Oral Daily  . hydrochlorothiazide  12.5 mg Oral Daily  . insulin aspart  0-15 Units Subcutaneous TID WC  . loratadine  10 mg Oral Daily  . metoCLOPramide  10 mg Oral BID AC  . pantoprazole  40 mg Oral Q1200  . pioglitazone  45 mg Oral Q breakfast  . rivaroxaban  15 mg Oral BID  . senna  1 tablet Oral BID  . sodium chloride  3 mL Intravenous Q12H  . tiotropium  18 mcg Inhalation Daily  . DISCONTD: albuterol  2 puff Inhalation QID  . DISCONTD: enoxaparin (LOVENOX) injection  110 mg Subcutaneous Q12H  . DISCONTD: gabapentin  800 mg Oral TID  . DISCONTD: metoprolol tartrate  25 mg Oral BID  . DISCONTD: warfarin  7.5 mg Oral ONCE-1800  . DISCONTD: warfarin   Does not apply Once  . DISCONTD: Warfarin - Pharmacist Dosing Inpatient   Does not apply q1800     Results for orders placed during the hospital encounter of 03/25/12 (from the past 48 hour(s))  ANTITHROMBIN III     Status: Abnormal  Collection Time   03/25/12  3:25 PM      Component Value Range Comment   AntiThromb III Func 126 (*) 76 - 126 (%)   PROTEIN C ACTIVITY     Status: Abnormal   Collection Time   03/25/12  3:25 PM      Component Value Range Comment   Protein C Activity 138 (*) 75 - 133 (%)   PROTEIN C, TOTAL     Status: Normal   Collection Time   03/25/12  3:25 PM      Component Value Range Comment   Protein C, Total 79  72 - 160 (%)   PROTEIN S ACTIVITY     Status: Normal   Collection Time   03/25/12  3:25 PM      Component Value Range Comment   Protein S Activity 79  69 - 129 (%)   PROTEIN S, TOTAL     Status: Normal   Collection  Time   03/25/12  3:25 PM      Component Value Range Comment   Protein S Ag, Total 74  60 - 150 (%)   LUPUS ANTICOAGULANT PANEL     Status: Normal   Collection Time   03/25/12  3:25 PM      Component Value Range Comment   PTT Lupus Anticoagulant 35.1  28.0 - 43.0 (secs)    PTTLA Confirmation NOT APPL  <8.0 (secs)    PTTLA 4:1 Mix NOT APPL  28.0 - 43.0 (secs)    Drvvt 38.1  <45.1 (secs)    Drvvt confirmation NOT APPL  <1.16 (Ratio)    dRVVT Incubated 1:1 Mix NOT APPL  <45.1 (secs)    Lupus Anticoagulant NOT DETECTED  NOT DETECTED    HOMOCYSTEINE, SERUM     Status: Normal   Collection Time   03/25/12  3:25 PM      Component Value Range Comment   Homocysteine-Norm 10.2  4.0 - 15.4 (umol/L)   FACTOR 5 LEIDEN     Status: Normal   Collection Time   03/25/12  3:25 PM      Component Value Range Comment   Recommendations-F5LEID: (NOTE)     PROTIME-INR     Status: Normal   Collection Time   03/25/12  3:25 PM      Component Value Range Comment   Prothrombin Time 12.9  11.6 - 15.2 (seconds)    INR 0.95  0.00 - 1.49    APTT     Status: Normal   Collection Time   03/25/12  3:25 PM      Component Value Range Comment   aPTT 27  24 - 37 (seconds)   GLUCOSE, CAPILLARY     Status: Abnormal   Collection Time   03/25/12  5:10 PM      Component Value Range Comment   Glucose-Capillary 164 (*) 70 - 99 (mg/dL)   CARDIAC PANEL(CRET KIN+CKTOT+MB+TROPI)     Status: Normal   Collection Time   03/25/12  6:20 PM      Component Value Range Comment   Total CK 62  7 - 232 (U/L)    CK, MB 2.8  0.3 - 4.0 (ng/mL)    Troponin I <0.30  <0.30 (ng/mL)    Relative Index RELATIVE INDEX IS INVALID  0.0 - 2.5    HEMOGLOBIN A1C     Status: Abnormal   Collection Time   03/25/12  6:20 PM      Component Value Range Comment  Hemoglobin A1C 7.1 (*) <5.7 (%)    Mean Plasma Glucose 157 (*) <117 (mg/dL)   GLUCOSE, CAPILLARY     Status: Abnormal   Collection Time   03/25/12  8:51 PM      Component Value Range Comment    Glucose-Capillary 159 (*) 70 - 99 (mg/dL)    Comment 1 Documented in Chart      Comment 2 Notify RN     CARDIAC PANEL(CRET KIN+CKTOT+MB+TROPI)     Status: Normal   Collection Time   03/26/12  1:40 AM      Component Value Range Comment   Total CK 57  7 - 232 (U/L)    CK, MB 2.2  0.3 - 4.0 (ng/mL)    Troponin I <0.30  <0.30 (ng/mL)    Relative Index RELATIVE INDEX IS INVALID  0.0 - 2.5    CBC     Status: Abnormal   Collection Time   03/26/12  1:40 AM      Component Value Range Comment   WBC 8.5  4.0 - 10.5 (K/uL)    RBC 4.69  4.22 - 5.81 (MIL/uL)    Hemoglobin 12.9 (*) 13.0 - 17.0 (g/dL)    HCT 37.8 (*) 39.0 - 52.0 (%)    MCV 80.6  78.0 - 100.0 (fL)    MCH 27.5  26.0 - 34.0 (pg)    MCHC 34.1  30.0 - 36.0 (g/dL)    RDW 15.0  11.5 - 15.5 (%)    Platelets 206  150 - 400 (K/uL)   PROTIME-INR     Status: Normal   Collection Time   03/26/12  1:40 AM      Component Value Range Comment   Prothrombin Time 13.3  11.6 - 15.2 (seconds)    INR 0.99  0.00 - 1.49    GLUCOSE, CAPILLARY     Status: Abnormal   Collection Time   03/26/12  8:02 AM      Component Value Range Comment   Glucose-Capillary 120 (*) 70 - 99 (mg/dL)   CARDIAC PANEL(CRET KIN+CKTOT+MB+TROPI)     Status: Normal   Collection Time   03/26/12  8:35 AM      Component Value Range Comment   Total CK 53  7 - 232 (U/L)    CK, MB 2.3  0.3 - 4.0 (ng/mL)    Troponin I <0.30  <0.30 (ng/mL)    Relative Index RELATIVE INDEX IS INVALID  0.0 - 2.5    GLUCOSE, CAPILLARY     Status: Abnormal   Collection Time   03/26/12 11:40 AM      Component Value Range Comment   Glucose-Capillary 155 (*) 70 - 99 (mg/dL)   GLUCOSE, CAPILLARY     Status: Abnormal   Collection Time   03/26/12  5:22 PM      Component Value Range Comment   Glucose-Capillary 171 (*) 70 - 99 (mg/dL)   GLUCOSE, CAPILLARY     Status: Abnormal   Collection Time   03/26/12  8:39 PM      Component Value Range Comment   Glucose-Capillary 175 (*) 70 - 99 (mg/dL)   CBC      Status: Normal   Collection Time   03/27/12  5:20 AM      Component Value Range Comment   WBC 8.3  4.0 - 10.5 (K/uL)    RBC 4.96  4.22 - 5.81 (MIL/uL)    Hemoglobin 13.3  13.0 - 17.0 (g/dL)    HCT  39.9  39.0 - 52.0 (%)    MCV 80.4  78.0 - 100.0 (fL)    MCH 26.8  26.0 - 34.0 (pg)    MCHC 33.3  30.0 - 36.0 (g/dL)    RDW 14.6  11.5 - 15.5 (%)    Platelets 236  150 - 400 (K/uL)   BASIC METABOLIC PANEL     Status: Abnormal   Collection Time   03/27/12  5:20 AM      Component Value Range Comment   Sodium 137  135 - 145 (mEq/L)    Potassium 3.8  3.5 - 5.1 (mEq/L)    Chloride 103  96 - 112 (mEq/L)    CO2 24  19 - 32 (mEq/L)    Glucose, Bld 144 (*) 70 - 99 (mg/dL)    BUN 12  6 - 23 (mg/dL)    Creatinine, Ser 0.66  0.50 - 1.35 (mg/dL)    Calcium 9.4  8.4 - 10.5 (mg/dL)    GFR calc non Af Amer >90  >90 (mL/min)    GFR calc Af Amer >90  >90 (mL/min)   GLUCOSE, CAPILLARY     Status: Abnormal   Collection Time   03/27/12  7:26 AM      Component Value Range Comment   Glucose-Capillary 150 (*) 70 - 99 (mg/dL)   GLUCOSE, CAPILLARY     Status: Abnormal   Collection Time   03/27/12 11:36 AM      Component Value Range Comment   Glucose-Capillary 146 (*) 70 - 99 (mg/dL)   GLUCOSE, CAPILLARY     Status: Abnormal   Collection Time   03/27/12 12:12 PM      Component Value Range Comment   Glucose-Capillary 130 (*) 70 - 99 (mg/dL)     No results found.  Review of Systems  Constitutional: Negative for fever and diaphoresis.  HENT: Negative for congestion.   Eyes: Negative for blurred vision.  Respiratory: Positive for cough and shortness of breath.   Cardiovascular: Negative for chest pain, palpitations, orthopnea, claudication, leg swelling and PND.  Gastrointestinal: Positive for constipation. Negative for nausea, vomiting, abdominal pain, diarrhea, blood in stool and melena.  Genitourinary: Negative for dysuria and hematuria.  Neurological: Negative for dizziness and headaches.   Blood  pressure 120/86, pulse 115, temperature 97.7 F (36.5 C), temperature source Oral, resp. rate 18, height 5' 7.5" (1.715 m), weight 110.587 kg (243 lb 12.8 oz), SpO2 99.00%. Physical Exam  Constitutional: He is oriented to person, place, and time. No distress.       Morbidly obese, deconditioned male  HENT:  Head: Normocephalic and atraumatic.  Eyes: EOM are normal. Pupils are equal, round, and reactive to light. No scleral icterus.  Neck: Normal range of motion. Neck supple.  Cardiovascular: An irregularly irregular rhythm present.  No murmur heard. Pulses:      Radial pulses are 2+ on the right side, and 2+ on the left side.       Dorsalis pedis pulses are 1+ on the right side, and 1+ on the left side.       No carotid bruit  Respiratory: Effort normal. He has wheezes.       Decreased breath sounds  GI: Bowel sounds are normal. There is no tenderness.       Obese abdomen   Musculoskeletal: He exhibits no edema.  Lymphadenopathy:    He has no cervical adenopathy.  Neurological: He is alert and oriented to person, place, and time. He exhibits  normal muscle tone.  Skin: Skin is warm and dry.  Psychiatric: He has a normal mood and affect.    Assessment/Plan: Patient Active Hospital Problem List: Acute pulmonary embolism (03/25/2012) Shortness of breath (03/25/2012) COPD (chronic obstructive pulmonary disease) (03/25/2012) DM (diabetes mellitus), type 2, uncontrolled with complications (03/30/8415) AF (paroxysmal atrial fibrillation) (03/27/2012)   Plan:  Patient will be started on Cardizem drip at 5 mg per hour with 10 mg bolus to control heart rate below 100.  He is already on Xarelto for pulmonary embolism.  We'll consider outpatient cardioversion if he doesn't spontaneously convert on his own. We'll also need outpatient nuclear stress test but not in the acute PE setting he is in now.  Paul [name], Paul [name] 03/27/2012, 3:08 PM   ATTENDING ATTESTATION:  I have seen and examined the patient  along with Tarri Fuller, PA.  I have reviewed the chart, notes and new data.  I agree with Paul [name]'s exam, findings and recommendations.  Brief Description: Obese 71 y/o man with reported h/o CAD (by cath years ago & no further w/u) admitted with PE -- started on Xarelto for anticoagulation.  He went into Afib with RVR this AM -- most likely related to PE, but cannot exclude coronary ischemia.   I agree with Diltiazem gtt overnight with PO BB.  May need to reduce home antihypertensives.  Convert to PO when rate adequately controlled. His Echo was relatively normal indicating that he has not had cardiac injury from presumed CAD.  Would potentially consider Myoview ST once stable after PE. He will be on Xarelto for PE for at least 6 months - can reassess Afib burden with MCOT at that point. For now would prefer rate control, as he is relatively asymptomatic -- if he remains in Afib when seen in f/u, would consider potential DCCV.   Will follow along. Leonie Man, M.D., M.S. [facility] [address] [place], [place]  [address]  [phone]  03/27/2012 5:19 PM

## 2012-03-27 NOTE — Progress Notes (Addendum)
Pt at rest on 3L O2 SATS 98%. Pt at rest without O2 - SAT's  95% (after pt was off O2 at rest for 5 minutes his SATs dropped to 89%, O2 reapplied). Pt ambulating with 3L O2 Sats 90-93% pt walked halfway down hall and back, at end of walk sats were 87-90% on 3L . Did not attempt ambulation without O2.  Barbee Shropshire. Brigitte Pulse, RN

## 2012-03-28 DIAGNOSIS — R0602 Shortness of breath: Secondary | ICD-10-CM

## 2012-03-28 DIAGNOSIS — E1165 Type 2 diabetes mellitus with hyperglycemia: Secondary | ICD-10-CM | POA: Diagnosis not present

## 2012-03-28 DIAGNOSIS — IMO0002 Reserved for concepts with insufficient information to code with codable children: Secondary | ICD-10-CM

## 2012-03-28 DIAGNOSIS — I4891 Unspecified atrial fibrillation: Secondary | ICD-10-CM

## 2012-03-28 DIAGNOSIS — I2699 Other pulmonary embolism without acute cor pulmonale: Secondary | ICD-10-CM

## 2012-03-28 DIAGNOSIS — E118 Type 2 diabetes mellitus with unspecified complications: Secondary | ICD-10-CM

## 2012-03-28 LAB — BETA-2-GLYCOPROTEIN I ABS, IGG/M/A
Beta-2 Glyco I IgG: 3 G Units (ref <20)
Beta-2-Glycoprotein I IgA: 3 A Units (ref <20)
Beta-2-Glycoprotein I IgM: 8 M Units (ref <20)

## 2012-03-28 LAB — CARDIOLIPIN ANTIBODIES, IGG, IGM, IGA
Anticardiolipin IgA: 2 APL U/mL — ABNORMAL LOW (ref <22)
Anticardiolipin IgG: 5 GPL U/mL — ABNORMAL LOW (ref <23)
Anticardiolipin IgM: 5 MPL U/mL — ABNORMAL LOW (ref <11)

## 2012-03-28 LAB — CBC
HCT: 38 % — ABNORMAL LOW (ref 39.0–52.0)
Hemoglobin: 12.6 g/dL — ABNORMAL LOW (ref 13.0–17.0)
MCH: 26.8 pg (ref 26.0–34.0)
MCHC: 33.2 g/dL (ref 30.0–36.0)
MCV: 80.7 fL (ref 78.0–100.0)
Platelets: 235 10*3/uL (ref 150–400)
RBC: 4.71 MIL/uL (ref 4.22–5.81)
RDW: 14.6 % (ref 11.5–15.5)
WBC: 8.4 10*3/uL (ref 4.0–10.5)

## 2012-03-28 LAB — BASIC METABOLIC PANEL
BUN: 15 mg/dL (ref 6–23)
CO2: 23 mEq/L (ref 19–32)
Calcium: 9.2 mg/dL (ref 8.4–10.5)
Chloride: 102 mEq/L (ref 96–112)
Creatinine, Ser: 0.7 mg/dL (ref 0.50–1.35)
GFR calc Af Amer: >90 mL/min (ref >90)
GFR calc non Af Amer: >90 mL/min (ref >90)
Glucose, Bld: 145 mg/dL — ABNORMAL HIGH (ref 70–99)
Potassium: 3.4 mEq/L — ABNORMAL LOW (ref 3.5–5.1)
Sodium: 138 mEq/L (ref 135–145)

## 2012-03-28 LAB — GLUCOSE, CAPILLARY
Glucose-Capillary: 163 mg/dL — ABNORMAL HIGH (ref 70–99)
Glucose-Capillary: 199 mg/dL — ABNORMAL HIGH (ref 70–99)

## 2012-03-28 MED ORDER — POTASSIUM CHLORIDE CRYS ER 20 MEQ PO TBCR
20.0000 meq | EXTENDED_RELEASE_TABLET | Freq: Once | ORAL | Status: AC
  Administered 2012-03-28: 20 meq via ORAL
  Filled 2012-03-28 (×2): qty 1

## 2012-03-28 MED ORDER — DILTIAZEM HCL 90 MG PO TABS
90.0000 mg | ORAL_TABLET | Freq: Three times a day (TID) | ORAL | Status: DC
  Administered 2012-03-28: 90 mg via ORAL
  Filled 2012-03-28 (×4): qty 1

## 2012-03-28 MED ORDER — SENNA 8.6 MG PO TABS: 1.0000 | ORAL_TABLET | Freq: Two times a day (BID) | ORAL | Status: DC

## 2012-03-28 MED ORDER — DSS 100 MG PO CAPS: 100.0000 mg | ORAL_CAPSULE | Freq: Two times a day (BID) | ORAL | Status: AC

## 2012-03-28 MED ORDER — DILTIAZEM HCL 90 MG PO TABS: 90.0000 mg | ORAL_TABLET | Freq: Three times a day (TID) | ORAL | Status: DC

## 2012-03-28 MED ORDER — LISINOPRIL 2.5 MG PO TABS: 2.5000 mg | ORAL_TABLET | Freq: Every day | ORAL | Status: DC

## 2012-03-28 MED ORDER — LORAZEPAM 1 MG PO TABS: 1.0000 mg | ORAL_TABLET | Freq: Four times a day (QID) | ORAL | Status: AC | PRN

## 2012-03-28 MED ORDER — RIVAROXABAN 15 MG PO TABS: ORAL_TABLET | ORAL | Status: DC

## 2012-03-28 NOTE — Progress Notes (Signed)
Subjective: Heart rate better controlled today. Wondering if he can be discharged today.  Objective: Vital signs in last 24 hours: Filed Vitals:   03/27/12 2100 03/28/12 0012 03/28/12 0533 03/28/12 0820  BP: 120/71 111/71 116/74   Pulse: 91 82 82   Temp:   98.7 F (37.1 C)   TempSrc:   Oral   Resp:   24   Height:      Weight:      SpO2:  93% 93% 94%   Weight change:   Intake/Output Summary (Last 24 hours) at 03/28/12 1341 Last data filed at 03/28/12 0835  Gross per 24 hour  Intake    480 ml  Output   1000 ml  Net   -520 ml    Physical Exam: General: Awake, Oriented, No acute distress. HEENT: EOMI. Neck: Supple CV: S1 and S2, tachycardic, irregularly irregular Lungs: Clear to ascultation bilaterally Abdomen: Soft, Nontender, Nondistended, +bowel sounds. Ext: Good pulses. Trace edema.  Lab Results:  Basename 03/28/12 0420 03/27/12 0520  NA 138 137  K 3.4* 3.8  CL 102 103  CO2 23 24  GLUCOSE 145* 144*  BUN 15 12  CREATININE 0.70 0.66  CALCIUM 9.2 9.4  MG -- --  PHOS -- --   No results found for this basename: AST:2,ALT:2,ALKPHOS:2,BILITOT:2,PROT:2,ALBUMIN:2 in the last 72 hours No results found for this basename: LIPASE:2,AMYLASE:2 in the last 72 hours  Basename 03/28/12 0420 03/27/12 0520  WBC 8.4 8.3  NEUTROABS -- --  HGB 12.6* 13.3  HCT 38.0* 39.9  MCV 80.7 80.4  PLT 235 236    Basename 03/26/12 0835 03/26/12 0140 03/25/12 1820  CKTOTAL 53 57 62  CKMB 2.3 2.2 2.8  CKMBINDEX -- -- --  TROPONINI <0.30 <0.30 <0.30   No components found with this basename: POCBNP:3 No results found for this basename: DDIMER:2 in the last 72 hours  Basename 03/25/12 1820  HGBA1C 7.1*   No results found for this basename: CHOL:2,HDL:2,LDLCALC:2,TRIG:2,CHOLHDL:2,LDLDIRECT:2 in the last 72 hours No results found for this basename: TSH,T4TOTAL,FREET3,T3FREE,THYROIDAB in the last 72 hours No results found for this basename:  VITAMINB12:2,FOLATE:2,FERRITIN:2,TIBC:2,IRON:2,RETICCTPCT:2 in the last 72 hours  Micro Results: No results found for this or any previous visit (from the past 240 hour(s)).  Studies/Results: No results found.  Medications: I have reviewed the patient's current medications. Scheduled Meds:    . diltiazem  10 mg Intravenous Once  . diltiazem  90 mg Oral Q8H  . docusate sodium  100 mg Oral BID  . ezetimibe  10 mg Oral Daily  . fluticasone  2 spray Each Nare Daily  . Fluticasone-Salmeterol  1 puff Inhalation Q12H  . gabapentin  2,400 mg Oral QHS  . glimepiride  4 mg Oral BID WC  . guaiFENesin  1,200 mg Oral Daily  . hydrochlorothiazide  12.5 mg Oral Daily  . insulin aspart  0-15 Units Subcutaneous TID WC  . loratadine  10 mg Oral Daily  . metoCLOPramide  10 mg Oral BID AC  . pantoprazole  40 mg Oral Q1200  . pioglitazone  45 mg Oral Q breakfast  . potassium chloride  20 mEq Oral Once  . rivaroxaban  15 mg Oral BID  . senna  1 tablet Oral BID  . sodium chloride  3 mL Intravenous Q12H  . tiotropium  18 mcg Inhalation Daily  . DISCONTD: gabapentin  800 mg Oral TID  . DISCONTD: metoprolol tartrate  25 mg Oral BID   Continuous Infusions:    . diltiazem (  CARDIZEM) infusion 5 mg/hr (03/28/12 0236)   PRN Meds:.albuterol, HYDROmorphone (DILAUDID) injection, LORazepam, ondansetron (ZOFRAN) IV, ondansetron, oxyCODONE  Assessment/Plan: Acute pulmonary embolism Discontinued Coumadin and Lovenox.  Continue Rivaroxaban (given no evidence of recurrence of malignancy) at patient's request, discussed with Dr. Julien Nordmann who agreed with Rivaroxaban.  Troponins negative x3.  2-D echocardiogram on 03/26/2012 chart cavity size was normal, wall thickness was increased in a pattern of mild LVH, ejection fraction 60%, right ventricle poorly visualized, overall function okay.  No PT/OT needs.  Hypercoagulable workup pending, homocysteine normal.  Lupus anticoagulant normal.  Protein C and S total normal,  Protein C activity abnormal likely due to acute pulmonary embolism.  Cardiolipin antibody and beta-2 glycoprotein antibody pending.  AntiThrombin III upper end of normal at 126.  A. fib with RVR Rate not better controlled.  Transitioned IV diltiazem to oral diltiazem. Cardiology input appreciated.  2-D echocardiogram done as indicated above.  Patient anticoagulated on Rivaroxaban. Followup with Dr. Ellyn Hack as outpatient.  Shortness of breath Secondary to pulmonary embolism.  Improved.  COPD (chronic obstructive pulmonary disease) Stable.  Continue home inhalers.  DM (diabetes mellitus), type 2, uncontrolled with complications Metformin held, which will be resumed at discharge.  Hemoglobin A1c on 03/25/2012 was 7.1 suggesting average blood sugar of 157.  Continue glimepiride and pioglitazone.  Sliding scale insulin.  Anxiety Continue PRN Ativan.  History of non-small cell lung cancer, adenocarcinoma with bronchial alveolar features Management as per Dr. Julien Nordmann.  Prophylaxis Rivaroxaban.  Disposition Discharge patient today.   LOS: 3 days  Lillianah Swartzentruber A, MD 03/28/2012, 1:41 PM

## 2012-03-28 NOTE — Progress Notes (Signed)
[facility]  Subjective: No complaints  Objective: Vital signs in last 24 hours: Temp:  [97.7 F (36.5 C)-98.7 F (37.1 C)] 98.7 F (37.1 C) (05/17 0533) Pulse Rate:  [82-115] 82  (05/17 0533) Resp:  [18-28] 24  (05/17 0533) BP: (111-127)/(71-86) 116/74 mmHg (05/17 0533) SpO2:  [93 %-99 %] 93 % (05/17 0533) FiO2 (%):  [3 %] 3 % (05/17 0012) Last BM Date: 03/27/12  Intake/Output from previous day: 05/16 0701 - 05/17 0700 In: 480 [P.O.:480] Out: 1825 [Urine:1825] Intake/Output this shift:    Medications Current Facility-Administered Medications  Medication Dose Route Frequency Provider Last Rate Last Dose  . albuterol (PROVENTIL HFA;VENTOLIN HFA) 108 (90 BASE) MCG/ACT inhaler 2 puff  2 puff Inhalation Q4H PRN Paul Bellows, Paul [name]      . diltiazem (CARDIZEM) 1 mg/mL load via infusion 10 mg  10 mg Intravenous Once Paul Fuller, Paul [name]      . diltiazem (CARDIZEM) 100 mg in dextrose 5 % 100 mL infusion  5 mg/hr Intravenous Titrated Paul Fuller, Paul [name] 5 mL/hr at 03/28/12 0236 5 mg/hr at 03/28/12 0236  . docusate sodium (COLACE) capsule 100 mg  100 mg Oral BID Paul Dumas, Paul [name]   100 mg at 03/27/12 2119  . ezetimibe (ZETIA) tablet 10 mg  10 mg Oral Daily Paul Dumas, Paul [name]   10 mg at 03/27/12 2119  . fluticasone (FLONASE) 50 MCG/ACT nasal spray 2 spray  2 spray Each Nare Daily Paul Dumas, Paul [name]   2 spray at 03/27/12 0800  . Fluticasone-Salmeterol (ADVAIR) 250-50 MCG/DOSE inhaler 1 puff  1 puff Inhalation Q12H Paul Dumas, Paul [name]   1 puff at 03/27/12 1921  . gabapentin (NEURONTIN) capsule 2,400 mg  2,400 mg Oral QHS Paul Bellows, Paul [name]   2,400 mg at 03/27/12 2119  . glimepiride (AMARYL) tablet 4 mg  4 mg Oral BID WC Paul Dumas, Paul [name]   4 mg at 03/28/12 0742  . guaiFENesin (MUCINEX) 12 hr tablet 1,200 mg  1,200 mg Oral Daily Paul Dumas, Paul [name]   1,200 mg at 03/27/12 0857  . hydrochlorothiazide (MICROZIDE) capsule 12.5 mg  12.5 mg Oral Daily Paul Dumas, Paul [name]   12.5 mg at  03/27/12 0858  . HYDROmorphone (DILAUDID) injection 0.5 mg  0.5 mg Intravenous Q4H PRN Paul Dumas, Paul [name]      . insulin aspart (novoLOG) injection 0-15 Units  0-15 Units Subcutaneous TID WC Paul Dumas, Paul [name]   5 Units at 03/27/12 1829  . loratadine (CLARITIN) tablet 10 mg  10 mg Oral Daily Paul Dumas, Paul [name]   10 mg at 03/27/12 0858  . LORazepam (ATIVAN) tablet 1 mg  1 mg Oral Q6H PRN Paul Bellows, Paul [name]   1 mg at 03/27/12 2133  . metoCLOPramide (REGLAN) tablet 10 mg  10 mg Oral BID AC Paul Dumas, Paul [name]   10 mg at 03/27/12 1750  . ondansetron (ZOFRAN) tablet 4 mg  4 mg Oral Q6H PRN Paul Dumas, Paul [name]       Or  . ondansetron (ZOFRAN) injection 4 mg  4 mg Intravenous Q6H PRN Paul Dumas, Paul [name]      . oxyCODONE (Oxy IR/ROXICODONE) immediate release tablet 5 mg  5 mg Oral Q4H PRN Paul Dumas, Paul [name]   5 mg at 03/25/12 1948  . pantoprazole (PROTONIX) EC tablet 40 mg  40 mg Oral Q1200 Paul Dumas, Paul [name]   40 mg at 03/27/12 1217  . pioglitazone (ACTOS) tablet 45 mg  45 mg Oral Q breakfast Paul  Abrol, Paul [name]   45 mg at 03/27/12 0857  . Rivaroxaban (XARELTO) tablet TABS 15 mg  15 mg Oral BID Paul Bellows, Paul [name]   15 mg at 03/28/12 0718  . senna (SENOKOT) tablet 8.6 mg  1 tablet Oral BID Paul Dumas, Paul [name]   8.6 mg at 03/27/12 2119  . sodium chloride 0.9 % injection 3 mL  3 mL Intravenous Q12H Paul Dumas, Paul [name]   3 mL at 03/27/12 2200  . tiotropium (SPIRIVA) inhalation capsule 18 mcg  18 mcg Inhalation Daily Paul Dumas, Paul [name]   18 mcg at 03/27/12 0727  . DISCONTD: gabapentin (NEURONTIN) capsule 800 mg  800 mg Oral TID Paul Bellows, Paul [name]      . DISCONTD: metoprolol tartrate (LOPRESSOR) tablet 25 mg  25 mg Oral BID Paul Bellows, Paul [name]        PE: General appearance: alert, cooperative and no distress Lungs: clear to auscultation bilaterally Heart: irregularly irregular rhythm Extremities: Trace LEE Pulses: 2+ and symmetric  Lab Results:   Basename 03/28/12 0420 03/27/12 0520 03/26/12 0140  WBC 8.4 8.3 8.5  HGB 12.6* 13.3  12.9*  HCT 38.0* 39.9 37.8*  PLT 235 236 206   BMET  Basename 03/28/12 0420 03/27/12 0520 03/25/12 1220  NA 138 137 135  K 3.4* 3.8 3.8  CL 102 103 107  CO2 23 24 --  GLUCOSE 145* 144* 210*  BUN _0 CREATININE 0.70 0.66 0.90  CALCIUM 9.2 9.4 --   PT/INR  Basename 03/26/12 0140 03/25/12 1525  LABPROT 13.3 12.9  INR 0.99 0.95      Assessment/Plan  Active Problems:  Acute pulmonary embolism  Shortness of breath  COPD (chronic obstructive pulmonary disease)  DM (diabetes mellitus), type 2, uncontrolled with complications  AF (paroxysmal atrial fibrillation)  Plan:  Afib was new yesterday.   Better HR control.  Trend on telemetry shows 80's after IV diltiazem titrated to 9m/hr. Will change to 956mPO 3x daily.   On Xarelto already for PE already.  Will arrange OP follow up with Paul [name] I discussed daily weights and to call our office if a 1-2# gain in 24hrs or 4-5 in 7 days.   Telemetry tech noticed V-tach looking rhythm on the monitor.  Not in every lead.  The patient was washing up in the bathroom at the time and was asymptomatic.  Likely artifact.  Anxious to go home.   LOS: 3 days    Paul [name], Paul [name] 03/28/2012 8:03 AM  I did not see the patient this AM prior to his discharge, but did discuss the recommendations noted above with Paul [name]  As his rate improved overnight - converted to PO CCB + BB.  On Xarelto for anticoagulation.    I look forward to seeing him in OP.    Thank you.  HALeonie ManM.D., M.S. [facility] [address] [place]  [phone] # [phone]/17/2013 3:52 PM

## 2012-03-28 NOTE — Discharge Instructions (Signed)
Rivaroxaban oral tablets What is this medicine? RIVAROXABAN (ri va ROX a ban) is an anticoagulant (blood thinner). It is used after knee or hip surgeries to prevent blood clotting. It is also used to lower the chance of stroke in people with a medical condition called atrial fibrillation. This medicine may be used for other purposes; ask your health care provider or pharmacist if you have questions. What should I tell my health care provider before I take this medicine? They need to know if you have any of these conditions: -bleeding disorders -bleeding in the brain -blood in your stools (black or tarry stools) or if you have blood in your vomit -history of stomach bleeding -kidney disease -liver disease -low blood counts, like low white cell, platelet, or red cell counts -recent or planned spinal or epidural procedure -take medicines that treat or prevent blood clots -an unusual or allergic reaction to rivaroxaban, other medicines, foods, dyes, or preservatives -pregnant or trying to get pregnant -breast-feeding How should I use this medicine? Take this medicine by mouth with a glass of water. Follow the directions on the prescription label. Take your medicine at regular intervals. Do not take it more often than directed. Do not stop taking except on your doctor's advice. Talk to your pediatrician regarding the use of this medicine in children. Special care may be needed. Overdosage: If you think you've taken too much of this medicine contact a poison control center or emergency room at once. Overdosage: If you think you have taken too much of this medicine contact a poison control center or emergency room at once. NOTE: This medicine is only for you. Do not share this medicine with others. What if I miss a dose? If you miss a dose, take it as soon as you can. If it is almost time for your next dose, take only that dose. Do not take double or extra doses. What may interact with this  medicine? -aspirin and aspirin-like medicines -certain antibiotics like erythromycin, azithromycin, and clarithromycin -certain medicines for fungal infections like ketoconazole and itraconazole -certain medicines for irregular heart beat like amiodarone, quinidine, dronedarone -certain medicines for seizures like carbamazepine, phenytoin -certain medicines that treat or prevent blood clots like warfarin, enoxaparin, and dalteparin  -conivaptan -diltiazem -felodipine -indinavir -lopinavir; ritonavir -NSAIDS, medicines for pain and inflammation, like ibuprofen or naproxen -ranolazine -rifampin -ritonavir -St. John's wort -verapamil This list may not describe all possible interactions. Give your health care provider a list of all the medicines, herbs, non-prescription drugs, or dietary supplements you use. Also tell them if you smoke, drink alcohol, or use illegal drugs. Some items may interact with your medicine. What should I watch for while using this medicine? This medicine may increase your risk to bruise or bleed. Call your doctor or health care professional if you notice any unusual bleeding. Be careful brushing and flossing your teeth or using a toothpick because you may bleed more easily. If you have any dental work done, tell your dentist you are receiving this medicine. What side effects may I notice from receiving this medicine? Side effects that you should report to your doctor or health care professional as soon as possible: -allergic reactions like skin rash, itching or hives, swelling of the face, lips, or tongue -bloody or black, tarry stools -changes in vision -confusion, trouble speaking or understanding -red or Muneer Leider-brown urine -redness, blistering, peeling or loosening of the skin, including inside the mouth -severe headaches -spitting up blood or brown material that looks like  coffee grounds -sudden numbness or weakness of the face, arm or leg -trouble walking,  dizziness, loss of balance or coordination -unusual bruising or bleeding from the eye, gums, or nose  Side effects that usually do not require medical attention (Report these to your doctor or health care professional if they continue or are bothersome.): -dizziness -muscle pain This list may not describe all possible side effects. Call your doctor for medical advice about side effects. You may report side effects to FDA at 1-800-FDA-1088. Where should I keep my medicine? Keep out of the reach of children. Store at room temperature between 15 and 30 degrees C (59 and 86 degrees F). Throw away any unused medicine after the expiration date. NOTE: This sheet is a summary. It may not cover all possible information. If you have questions about this medicine, talk to your doctor, pharmacist, or health care provider.  2012, [employer]. (09/19/2010 8:48:10 AM)

## 2012-03-28 NOTE — Discharge Summary (Signed)
Discharge Summary  Paul [name] MR#: [id number]  DOB:02/28/1941  Date of Admission: 03/25/2012 Date of Discharge: 03/28/2012  Patient's PCP: Marjorie Smolder, MD, MD  Attending Physician:Girlie Veltri A  Consults: Dr. 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 Status post wedge resection of the left lower lobe by Dr. Arlyce Dice and Dr. Tammi Klippel, currently getting serial CT scans by Dr. Curt Bears, who come in with shortness of breath on 03/25/2012.   Discharge Medications Medication List  As of 03/28/2012  1:54 PM   TAKE these medications         albuterol 108 (90 BASE) MCG/ACT inhaler   Commonly known as: PROVENTIL HFA;VENTOLIN HFA   Inhale 2 puffs into the lungs every 6 (six) hours as needed.      diltiazem 90 MG tablet   Commonly known as: CARDIZEM   Take 1 tablet (90 mg total) by mouth every 8 (eight) hours.      DSS 100 MG Caps   Take 100 mg by mouth 2 (two) times daily. Hold for diarrhea.  For constipation.      esomeprazole 40 MG capsule   Commonly known as: NEXIUM   Take 40 mg by mouth daily before breakfast.      ezetimibe 10 MG tablet   Commonly known as: ZETIA   Take 10 mg by mouth daily.      fexofenadine 180 MG tablet   Commonly known as: ALLEGRA   Take 180 mg by mouth daily.      fluticasone 50 MCG/ACT nasal spray   Commonly known as: FLONASE   Place 2 sprays into the nose daily.      Fluticasone-Salmeterol 250-50 MCG/DOSE Aepb   Commonly known as: ADVAIR   Inhale 1 puff into the lungs every 12 (twelve) hours.      gabapentin 800 MG tablet   Commonly known as:  NEURONTIN   Take 800 mg by mouth 3 (three) times daily.      glimepiride 4 MG tablet   Commonly known as: AMARYL   Take 4 mg by mouth 2 (two) times daily.      guaiFENesin 600 MG 12 hr tablet   Commonly known as: MUCINEX   Take 1,200 mg by mouth daily.      hydrochlorothiazide 12.5 MG tablet   Commonly known as: HYDRODIURIL   Take 12.5 mg by mouth daily.      HYDROcodone-acetaminophen 10-325 MG per tablet   Commonly known as: NORCO   Take 1 tablet by mouth 2 (two) times daily. Am and hs      lisinopril 2.5 MG tablet   Commonly known as: PRINIVIL,ZESTRIL   Take 1 tablet (2.5 mg total) by mouth daily.      LORazepam 1 MG tablet   Commonly known as: ATIVAN   Take 1 tablet (1 mg total) by mouth every 6 (six) hours as needed for anxiety.      metFORMIN 1000 MG tablet   Commonly known as: GLUCOPHAGE   Take 1,000 mg by mouth 2 (two) times daily with a meal.      metoCLOPramide 10 MG tablet   Commonly known as: REGLAN   Take 10 mg by mouth  2 (two) times daily.      multivitamin tablet   Take 1 tablet by mouth daily. Centrum silver      multivitamin-iron-minerals-folic acid chewable tablet   Chew 1 tablet by mouth daily.      pioglitazone 45 MG tablet   Commonly known as: ACTOS   Take 45 mg by mouth daily.      Rivaroxaban 15 MG Tabs tablet   Commonly known as: XARELTO   15 mg twice daily for 19 days then 20 mg daily thereafter.  Please give two-month supply.      senna 8.6 MG Tabs   Commonly known as: SENOKOT   Take 1 tablet (8.6 mg total) by mouth 2 (two) times daily. For constipation.  Hold for diarrhea.      tiotropium 18 MCG inhalation capsule   Commonly known as: SPIRIVA   Place 18 mcg into inhaler and inhale daily.            Hospital Course: Acute pulmonary embolism Initially started on Coumadin and Lovenox which was discontinued and started the patient on Rivaroxaban.  Rivaroxaban was started (given no evidence of recurrence of malignancy) at patient's  request, discussed with Dr. Julien Nordmann who agreed with Rivaroxaban.  Troponins negative x3.  2-D echocardiogram on 03/26/2012 chart cavity size was normal, wall thickness was increased in a pattern of mild LVH, ejection fraction 60%, right ventricle poorly visualized, overall function okay.  No PT/OT needs.  Hypercoagulable workup pending, homocysteine normal.  Lupus anticoagulant normal.  Protein C and S total normal, Protein C activity abnormal likely due to acute pulmonary embolism.  Cardiolipin antibody and beta-2 glycoprotein antibody pending.  AntiThrombin III upper end of normal at 126.  Hypertension Lisinopril was initially discontinued given patient was started on diltiazem for A. fib.  At discharge patient was started on low dose lisinopril 2.5 mg daily (40 mg daily home dose) for nephro protective effects of diabetes.  Further titration of hypertensive medications to be done as outpatient.  A. fib with RVR On 03/27/2012 patient went into A. fib with RVR, started on diltiazem.  Rate better controlled.  Transitioned IV diltiazem to oral diltiazem. Cardiology, Dr. Ellyn Hack, input appreciated.  2-D echocardiogram done as indicated above.  Patient anticoagulated on Rivaroxaban.  Cardiology contemplating doing stress test as outpatient once patient's pulmonary embolism is resolved.  Shortness of breath Secondary to pulmonary embolism.  Improved.  On 03/27/2012 patient qualified for oxygen.  On 03/28/2012 patient was saturating 88% on room air and oxygenation improved to 92% while ambulating on room air, patient as of 03/28/2012 does not qualify for oxygen.  COPD (chronic obstructive pulmonary disease) Stable.  Continue home inhalers.  DM (diabetes mellitus), type 2, uncontrolled with complications Metformin held, which will be resumed at discharge.  Hemoglobin A1c on 03/25/2012 was 7.1 suggesting average blood sugar of 157.  Continue glimepiride and pioglitazone.  Sliding scale  insulin.  Anxiety Continue PRN Ativan.  History of non-small cell lung cancer, adenocarcinoma with bronchial alveolar features Management as per Dr. Julien Nordmann.  Day of Discharge BP 116/74  Pulse 82  Temp(Src) 98.7 F (37.1 C) (Oral)  Resp 24  Ht 5' 7.5" (1.715 m)  Wt 110.587 kg (243 lb 12.8 oz)  BMI 37.62 kg/m2  SpO2 92%  Results for orders placed during the hospital encounter of 03/25/12 (from the past 48 hour(s))  GLUCOSE, CAPILLARY     Status: Abnormal   Collection Time   03/26/12  5:22 PM  Component Value Range Comment   Glucose-Capillary 171 (*) 70 - 99 (mg/dL)   GLUCOSE, CAPILLARY     Status: Abnormal   Collection Time   03/26/12  8:39 PM      Component Value Range Comment   Glucose-Capillary 175 (*) 70 - 99 (mg/dL)   CBC     Status: Normal   Collection Time   03/27/12  5:20 AM      Component Value Range Comment   WBC 8.3  4.0 - 10.5 (K/uL)    RBC 4.96  4.22 - 5.81 (MIL/uL)    Hemoglobin 13.3  13.0 - 17.0 (g/dL)    HCT 39.9  39.0 - 52.0 (%)    MCV 80.4  78.0 - 100.0 (fL)    MCH 26.8  26.0 - 34.0 (pg)    MCHC 33.3  30.0 - 36.0 (g/dL)    RDW 14.6  11.5 - 15.5 (%)    Platelets 236  150 - 400 (K/uL)   BASIC METABOLIC PANEL     Status: Abnormal   Collection Time   03/27/12  5:20 AM      Component Value Range Comment   Sodium 137  135 - 145 (mEq/L)    Potassium 3.8  3.5 - 5.1 (mEq/L)    Chloride 103  96 - 112 (mEq/L)    CO2 24  19 - 32 (mEq/L)    Glucose, Bld 144 (*) 70 - 99 (mg/dL)    BUN 12  6 - 23 (mg/dL)    Creatinine, Ser 0.66  0.50 - 1.35 (mg/dL)    Calcium 9.4  8.4 - 10.5 (mg/dL)    GFR calc non Af Amer >90  >90 (mL/min)    GFR calc Af Amer >90  >90 (mL/min)   GLUCOSE, CAPILLARY     Status: Abnormal   Collection Time   03/27/12  7:26 AM      Component Value Range Comment   Glucose-Capillary 150 (*) 70 - 99 (mg/dL)   GLUCOSE, CAPILLARY     Status: Abnormal   Collection Time   03/27/12 11:36 AM      Component Value Range Comment   Glucose-Capillary  146 (*) 70 - 99 (mg/dL)   GLUCOSE, CAPILLARY     Status: Abnormal   Collection Time   03/27/12 12:12 PM      Component Value Range Comment   Glucose-Capillary 130 (*) 70 - 99 (mg/dL)   GLUCOSE, CAPILLARY     Status: Abnormal   Collection Time   03/27/12  5:13 PM      Component Value Range Comment   Glucose-Capillary 207 (*) 70 - 99 (mg/dL)   GLUCOSE, CAPILLARY     Status: Abnormal   Collection Time   03/27/12  8:39 PM      Component Value Range Comment   Glucose-Capillary 120 (*) 70 - 99 (mg/dL)    Comment 1 Documented in Chart      Comment 2 Notify RN     CBC     Status: Abnormal   Collection Time   03/28/12  4:20 AM      Component Value Range Comment   WBC 8.4  4.0 - 10.5 (K/uL)    RBC 4.71  4.22 - 5.81 (MIL/uL)    Hemoglobin 12.6 (*) 13.0 - 17.0 (g/dL)    HCT 38.0 (*) 39.0 - 52.0 (%)    MCV 80.7  78.0 - 100.0 (fL)    MCH 26.8  26.0 - 34.0 (pg)  MCHC 33.2  30.0 - 36.0 (g/dL)    RDW 14.6  11.5 - 15.5 (%)    Platelets 235  150 - 400 (K/uL)   BASIC METABOLIC PANEL     Status: Abnormal   Collection Time   03/28/12  4:20 AM      Component Value Range Comment   Sodium 138  135 - 145 (mEq/L)    Potassium 3.4 (*) 3.5 - 5.1 (mEq/L)    Chloride 102  96 - 112 (mEq/L)    CO2 23  19 - 32 (mEq/L)    Glucose, Bld 145 (*) 70 - 99 (mg/dL)    BUN 15  6 - 23 (mg/dL)    Creatinine, Ser 0.70  0.50 - 1.35 (mg/dL)    Calcium 9.2  8.4 - 10.5 (mg/dL)    GFR calc non Af Amer >90  >90 (mL/min)    GFR calc Af Amer >90  >90 (mL/min)   GLUCOSE, CAPILLARY     Status: Abnormal   Collection Time   03/28/12  7:22 AM      Component Value Range Comment   Glucose-Capillary 163 (*) 70 - 99 (mg/dL)     Dg Ribs Unilateral W/chest Right  03/25/2012  *RADIOLOGY REPORT*  Clinical Data: Chest pain, rib pain  RIGHT RIBS AND CHEST - 3+ VIEW  Comparison: Chest x-ray 04/06/2009  Findings: Five views submitted for interpretation. Cardiomediastinal silhouette is stable.  Stable chronic interstitial prominence.   Stable postsurgical changes left mediastinum or paraspinal region.  No acute infiltrate or pulmonary edema.  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  No right rib fracture is identified. No diagnostic pneumothorax. Post cholecystectomy surgical clips are noted in the right upper abdomen.  IMPRESSION: No acute disease.  Stable chronic interstitial prominence.  No right rib fracture is identified.  No diagnostic pneumothorax.  Original Report Authenticated By: Lahoma Crocker, M.D.   Ct Angio Chest W/cm &/or Wo Cm  03/25/2012  *RADIOLOGY REPORT*  Clinical Data: Chest pain, cough, shortness of breath  CT ANGIOGRAPHY CHEST  Technique:  Multidetector CT imaging of the chest using the standard protocol during bolus administration of intravenous contrast. Multiplanar reconstructed images including MIPs were obtained and reviewed to evaluate the vascular anatomy.  Contrast: 172m OMNIPAQUE IOHEXOL 300 MG/ML  SOLN  Comparison: Chest radiographs dated 03/25/2012.  CT chest dated 01/10/2012.  Findings: Segmental/subsegmental pulmonary emboli in the left upper lobe (series 6/image 96), in the right middle lobe (series 6/image 145), and right lower lobe (series 6/image 157).  Overall clot burden is moderate.  Mild to moderate centrilobular emphysematous changes.  Postsurgical changes/volume loss in the left lower lobe with brachytherapy seeds.  Small right pleural effusion with associated right lower lobe opacity, likely atelectasis.  No pneumothorax.  Visualized thyroid is unremarkable.  The heart is normal in size.  No pericardial effusion.  Coronary atherosclerosis.  Sclerotic calcifications of the aortic arch.  Small mediastinal lymph nodes which do not meet pathologic CT size criteria.  No suspicious hilar or axillary lymphadenopathy.  Visualized upper abdomen is notable for a right adrenal myelolipoma and hepatic steatosis.  Degenerative changes of the visualized thoracolumbar spine.  No  rib fracture is seen.  IMPRESSION: Bilateral segmental/subsegmental pulmonary emboli, as above. Overall clot burden is moderate.  Postsurgical changes/volume loss with brachytherapy seeds in the left lower lobe.  Small right pleural effusion with associated right lower lobe atelectasis.  Underlying centrilobular emphysematous changes.  Critical Value/emergent results were called by telephone  at the time of interpretation on 03/25/2012  at 1425 hours  to  Dr. Davonna Belling, who verbally acknowledged these results.  Original Report Authenticated By: Julian Hy, M.D.   2-D echocardiogram on 03/26/2012  Study Conclusions - Left ventricle: Technically limited images. The cavity size was normal. Wall thickness was increased in a pattern of mild LVH. The estimated ejection fraction was 60%. Regional wall motion abnormalities cannot be excluded. - Left atrium: The atrium was mildly dilated. - Right ventricle: Poorly visualized. Overall RV function probably OK. RV pressure can not be estimated.  Disposition: Home, no PT or OT needs as per PT and OT.  Diet: Diabetic diet  Activity: Resume as tolerated   Follow-up Appts: Discharge Orders    Future Appointments: Provider: Department: Dept Phone: Center:   01/13/2013 9:30 AM Maree Krabbe [facility] [phone] None   01/13/2013 10:00 AM [facility] [phone] [facility]   01/14/2013 9:00 AM Curt Bears, MD [facility] [phone] None     Future Orders Please Complete By Expires   Diet Carb Modified      Increase activity slowly      Discharge instructions      Comments:   Followup with Marjorie Smolder, MD (PCP) in 1 week. Followup with Dr. Ellyn Hack (Cardiology) in 1-2 weeks.      TESTS THAT NEED FOLLOW-UP Cardiolipin antibody and beta-2 glycoprotein antibody which are still pending.  Primary care physician to followup on these labs.  Time spent on discharge, talking to the patient, and coordinating care: 35  mins.   Signed: Bynum Bellows, MD 03/28/2012, 1:54 PM

## 2012-04-08 ENCOUNTER — Encounter: Payer: Self-pay | Admitting: Cardiology

## 2012-04-08 ENCOUNTER — Ambulatory Visit (INDEPENDENT_AMBULATORY_CARE_PROVIDER_SITE_OTHER): Payer: Medicare Other | Admitting: Cardiology

## 2012-04-08 VITALS — BP 115/60 | HR 79 | Ht 68.0 in | Wt 249.0 lb

## 2012-04-08 DIAGNOSIS — E785 Hyperlipidemia, unspecified: Secondary | ICD-10-CM | POA: Insufficient documentation

## 2012-04-08 DIAGNOSIS — R609 Edema, unspecified: Secondary | ICD-10-CM | POA: Diagnosis not present

## 2012-04-08 DIAGNOSIS — I2510 Atherosclerotic heart disease of native coronary artery without angina pectoris: Secondary | ICD-10-CM | POA: Diagnosis not present

## 2012-04-08 DIAGNOSIS — I4891 Unspecified atrial fibrillation: Secondary | ICD-10-CM | POA: Diagnosis not present

## 2012-04-08 DIAGNOSIS — F17210 Nicotine dependence, cigarettes, uncomplicated: Secondary | ICD-10-CM | POA: Insufficient documentation

## 2012-04-08 DIAGNOSIS — I10 Essential (primary) hypertension: Secondary | ICD-10-CM

## 2012-04-08 MED ORDER — DILTIAZEM HCL ER COATED BEADS 180 MG PO CP24: 180.0000 mg | ORAL_CAPSULE | Freq: Every day | ORAL | Status: DC

## 2012-04-08 MED ORDER — PRAVASTATIN SODIUM 40 MG PO TABS: 40.0000 mg | ORAL_TABLET | Freq: Every evening | ORAL | Status: DC

## 2012-04-08 MED ORDER — DILTIAZEM HCL ER COATED BEADS 240 MG PO CP24: 240.0000 mg | ORAL_CAPSULE | Freq: Every day | ORAL | Status: DC

## 2012-04-08 NOTE — Assessment & Plan Note (Signed)
Patient had a recent acute pulmonary embolus. Note he had driven to [place] and back several weeks prior to his event. We will continue with xeralto for 9 months and then most likely discontinue. We will follow his renal function and hemoglobin. He is describing bilateral lower extremity edema left greater than right. Schedule venous Dopplers.

## 2012-04-08 NOTE — Assessment & Plan Note (Signed)
Blood pressure controlled. Continue present medications.

## 2012-04-08 NOTE — Assessment & Plan Note (Addendum)
Patient has a history of PCI. We will obtain those records. We will not add aspirin as he requires anticoagulation for his pulmonary embolus. Once that is complete we will treat with enteric coated aspirin 81 mg daily. Add Pravachol 40 mg daily. Plan functional study when he recovers from pulmonary embolus.  Review of records shows the patient had PTCA of his LAD in December of 1988. Last Myoview in March of 2007 showed an ejection fraction of 71% and no ischemia.

## 2012-04-08 NOTE — Assessment & Plan Note (Signed)
Patient counseled on discontinuing.

## 2012-04-08 NOTE — Assessment & Plan Note (Signed)
Patient did have atrial fibrillation while in the hospital. He is in sinus rhythm today. It is difficult to be sure but I believe his atrial fibrillation may have been related to his atrial fibrillation. If he has no recurrences then I still believe we can discontinue his anticoagulation 9 months after completely treating his pulmonary embolus. Discontinue his regular Cardizem and treat with Cardizem CD 240 mg daily. Check TSH

## 2012-04-08 NOTE — Patient Instructions (Signed)
Your physician recommends that you schedule a follow-up appointment in: 3 MONTHS WITH DR CRENSHAW  STOP CARDIZEM  START DILTIAZEM CD 240 MG ONCE DAILY  START PRAVACHOL 40 MG ONCE DAILY AT BEDTIME  Your physician has requested that you have a lower or upper extremity venous duplex. This test is an ultrasound of the veins in the legs or arms. It looks at venous blood flow that carries blood from the heart to the legs or arms. Allow one hour for a Lower Venous exam. Allow thirty minutes for an Upper Venous exam. There are no restrictions or special instructions.  Your physician recommends that you return for lab work in: 6 WEEKS=FASTING

## 2012-04-08 NOTE — Assessment & Plan Note (Signed)
Add Pravachol 40 mg daily. Check lipids and liver in 6 weeks. Goal LDL less than 70.

## 2012-04-08 NOTE — Progress Notes (Signed)
HPI: 71 year old Paul [name] for evaluation of atrial fibrillation. Patient states he had PCI in the late 80s by Dr. Doreatha Lew. Those records are not available. Patient recently admitted with a pulmonary embolus. During hospitalization he developed atrial fibrillation with a rapid ventricular response. Troponins were negative. Echocardiogram in may of 2013 showed normal LV function and mild left ventricular hypertrophy. Patient treated with xeralto and cardizem and DCed. TSH not checked. Since discharge he notes some dyspnea on exertion which is chronic and unchanged. No orthopnea or PND. No syncope or exertional chest pain. Occasional brief flutter but no sustained palpitations. He has had bilateral pedal edema left greater than right. No significant bleeding.  Current Outpatient Prescriptions  Medication Sig Dispense Refill  . albuterol (PROVENTIL HFA;VENTOLIN HFA) 108 (90 BASE) MCG/ACT inhaler Inhale 2 puffs into the lungs every 6 (six) hours as needed.        . ezetimibe (ZETIA) 10 MG tablet Take 10 mg by mouth daily.        . fexofenadine (ALLEGRA) 180 MG tablet Take 180 mg by mouth daily.        . fluticasone (FLONASE) 50 MCG/ACT nasal spray Place 2 sprays into the nose daily.        . Fluticasone-Salmeterol (ADVAIR) 250-50 MCG/DOSE AEPB Inhale 1 puff into the lungs every 12 (twelve) hours.        . gabapentin (NEURONTIN) 800 MG tablet Take 800 mg by mouth 3 (three) times daily.       Marland Kitchen glimepiride (AMARYL) 4 MG tablet Take 4 mg by mouth 2 (two) times daily.        Marland Kitchen guaiFENesin (MUCINEX) 600 MG 12 hr tablet Take 1,200 mg by mouth daily.      . hydrochlorothiazide (HYDRODIURIL) 12.5 MG tablet Take 12.5 mg by mouth daily.        Marland Kitchen HYDROcodone-acetaminophen (NORCO) 10-325 MG per tablet Take 1 tablet by mouth 2 (two) times daily. Am and hs       . lisinopril (PRINIVIL,ZESTRIL) 2.5 MG tablet Take 1 tablet (2.5 mg total) by mouth daily.  30 tablet  0  . metFORMIN (GLUCOPHAGE) 1000 MG tablet Take 1,000 mg by  mouth 2 (two) times daily with a meal.        . metoCLOPramide (REGLAN) 10 MG tablet Take 10 mg by mouth 2 (two) times daily.        . Multiple Vitamin (MULTIVITAMIN) tablet Take 1 tablet by mouth daily. Centrum silver       . multivitamin-iron-minerals-folic acid (CENTRUM) chewable tablet Chew 1 tablet by mouth daily.      . pioglitazone (ACTOS) 45 MG tablet Take 45 mg by mouth daily.        . Rivaroxaban (XARELTO) 15 MG TABS tablet 15 mg twice daily for 19 days then Paul mg daily thereafter.  Please give two-month supply.  80 tablet  0  . senna (SENOKOT) 8.6 MG TABS Take 1 tablet (8.6 mg total) by mouth 2 (two) times daily. For constipation.  Hold for diarrhea.  120 each  0  . tiotropium (SPIRIVA) 18 MCG inhalation capsule Place 18 mcg into inhaler and inhale daily.        Marland Kitchen docusate sodium 100 MG CAPS Take 100 mg by mouth 2 (two) times daily. Hold for diarrhea.  For constipation.  60 capsule  0  . LORazepam (ATIVAN) 1 MG tablet Take 1 tablet (1 mg total) by mouth every 6 (six) hours as needed for anxiety.  30 tablet  0    Allergies  Allergen Reactions  . Codeine Anaphylaxis  . Phenobarbital Hypertension    Past Medical History  Diagnosis Date  . Diabetes mellitus   . Hypertension   . COPD (chronic obstructive pulmonary disease)   . Asthma   . GERD (gastroesophageal reflux disease)   . Osteopenia   . lung ca dx'd 01/2006  . Pulmonary embolus   . Atrial fibrillation   . Hyperlipidemia   . CAD (coronary artery disease)     Prior PTCA  . Nephrolithiasis     Past Surgical History  Procedure Date  . Lung cancer surgery 01/2006    Small excision of left lung tissue; mesh with radioactive seeds (per pt report)  . Cholecystectomy   . Tonsillectomy   . Angioplasty Approx 1993  . Cardiac catheterization Approximately 1993  . Fixation kyphoplasty     History   Social History  . Marital Status: Married    Spouse Name: N/A    Number of Children: 2  . Years of Education: N/A    Occupational History  .      Retired from [employer] and [employer]  . Smoking status: Current Everyday Smoker -- 1.0 packs/day  . Smokeless tobacco: Never Used  . Alcohol Use: Yes     Rare  . Drug Use: No  . Sexually Active: No   Other Topics Concern  . Not on file   Social History Narrative  . No narrative on file    Family History  Problem Relation Age of Onset  . Heart disease      No family history    ROS: no fevers or chills, productive cough, hemoptysis, dysphasia, odynophagia, melena, hematochezia, dysuria, hematuria, rash, seizure activity, orthopnea, PND, claudication. Remaining systems are negative.  Physical Exam:   Blood pressure 115/60, pulse 79, height _0  (1.727 m), weight 112.946 kg (249 lb).  General:  Well developed/obese in NAD Skin warm/dry Patient not depressed No peripheral clubbing Back-normal HEENT-normal/normal eyelids Neck supple/normal carotid upstroke bilaterally; no bruits; no JVD; no thyromegaly chest - CTA/ normal expansion CV - RRR/normal S1 and S2; no murmurs, rubs or gallops;  PMI nondisplaced Abdomen -NT/ND, no HSM, no mass, + bowel sounds, no bruit 2+ femoral pulses, no bruits Ext-1+ edema, no chords, 2+ DP Neuro-grossly nonfocal  ECG 03/27/2012-atrial fibrillation with right bundle branch block. Today's electrocardiogram shows sinus rhythm at a rate of 79.  Incomplete right bundle branch block. Prolonged QT.

## 2012-04-16 DIAGNOSIS — E119 Type 2 diabetes mellitus without complications: Secondary | ICD-10-CM | POA: Diagnosis not present

## 2012-04-16 DIAGNOSIS — I2699 Other pulmonary embolism without acute cor pulmonale: Secondary | ICD-10-CM | POA: Diagnosis not present

## 2012-04-16 DIAGNOSIS — R609 Edema, unspecified: Secondary | ICD-10-CM | POA: Diagnosis not present

## 2012-04-16 DIAGNOSIS — I10 Essential (primary) hypertension: Secondary | ICD-10-CM | POA: Diagnosis not present

## 2012-04-23 ENCOUNTER — Encounter (INDEPENDENT_AMBULATORY_CARE_PROVIDER_SITE_OTHER): Payer: Medicare Other

## 2012-04-23 DIAGNOSIS — R609 Edema, unspecified: Secondary | ICD-10-CM | POA: Diagnosis not present

## 2012-04-26 DIAGNOSIS — I4891 Unspecified atrial fibrillation: Secondary | ICD-10-CM | POA: Diagnosis not present

## 2012-04-26 DIAGNOSIS — I2699 Other pulmonary embolism without acute cor pulmonale: Secondary | ICD-10-CM | POA: Diagnosis not present

## 2012-04-26 DIAGNOSIS — I10 Essential (primary) hypertension: Secondary | ICD-10-CM | POA: Diagnosis not present

## 2012-05-07 DIAGNOSIS — M545 Low back pain, unspecified: Secondary | ICD-10-CM | POA: Diagnosis not present

## 2012-05-07 DIAGNOSIS — G894 Chronic pain syndrome: Secondary | ICD-10-CM | POA: Diagnosis not present

## 2012-05-07 DIAGNOSIS — M48061 Spinal stenosis, lumbar region without neurogenic claudication: Secondary | ICD-10-CM | POA: Diagnosis not present

## 2012-05-12 DIAGNOSIS — E119 Type 2 diabetes mellitus without complications: Secondary | ICD-10-CM | POA: Diagnosis not present

## 2012-05-12 DIAGNOSIS — I10 Essential (primary) hypertension: Secondary | ICD-10-CM | POA: Diagnosis not present

## 2012-05-12 DIAGNOSIS — E7800 Pure hypercholesterolemia, unspecified: Secondary | ICD-10-CM | POA: Diagnosis not present

## 2012-05-12 DIAGNOSIS — Z86711 Personal history of pulmonary embolism: Secondary | ICD-10-CM | POA: Diagnosis not present

## 2012-05-26 ENCOUNTER — Other Ambulatory Visit (INDEPENDENT_AMBULATORY_CARE_PROVIDER_SITE_OTHER): Payer: Medicare Other

## 2012-05-26 DIAGNOSIS — I2510 Atherosclerotic heart disease of native coronary artery without angina pectoris: Secondary | ICD-10-CM | POA: Diagnosis not present

## 2012-05-26 DIAGNOSIS — I4891 Unspecified atrial fibrillation: Secondary | ICD-10-CM

## 2012-05-26 LAB — CBC WITH DIFFERENTIAL/PLATELET
Basophils Absolute: 0 10*3/uL (ref 0.0–0.1)
Basophils Relative: 0.3 % (ref 0.0–3.0)
Eosinophils Absolute: 0.2 10*3/uL (ref 0.0–0.7)
Eosinophils Relative: 2.5 % (ref 0.0–5.0)
HCT: 40.4 % (ref 39.0–52.0)
Hemoglobin: 13.1 g/dL (ref 13.0–17.0)
Lymphocytes Relative: 17.5 % (ref 12.0–46.0)
Lymphs Abs: 1.4 10*3/uL (ref 0.7–4.0)
MCHC: 32.5 g/dL (ref 30.0–36.0)
MCV: 83.8 fl (ref 78.0–100.0)
Monocytes Absolute: 0.7 10*3/uL (ref 0.1–1.0)
Monocytes Relative: 8.9 % (ref 3.0–12.0)
Neutro Abs: 5.6 10*3/uL (ref 1.4–7.7)
Neutrophils Relative %: 70.8 % (ref 43.0–77.0)
Platelets: 220 10*3/uL (ref 150.0–400.0)
RBC: 4.82 Mil/uL (ref 4.22–5.81)
RDW: 16.5 % — ABNORMAL HIGH (ref 11.5–14.6)
WBC: 8 10*3/uL (ref 4.5–10.5)

## 2012-05-26 LAB — LIPID PANEL
Cholesterol: 99 mg/dL (ref 0–200)
HDL: 62 mg/dL (ref >39.00)
LDL Cholesterol: 28 mg/dL (ref 0–99)
Total CHOL/HDL Ratio: 2
Triglycerides: 46 mg/dL (ref 0.0–149.0)
VLDL: 9.2 mg/dL (ref 0.0–40.0)

## 2012-05-26 LAB — HEPATIC FUNCTION PANEL
ALT: 23 U/L (ref 0–53)
AST: 18 U/L (ref 0–37)
Albumin: 3.8 g/dL (ref 3.5–5.2)
Alkaline Phosphatase: 41 U/L (ref 39–117)
Bilirubin, Direct: 0.1 mg/dL (ref 0.0–0.3)
Total Bilirubin: 0.4 mg/dL (ref 0.3–1.2)
Total Protein: 6.6 g/dL (ref 6.0–8.3)

## 2012-05-26 LAB — BASIC METABOLIC PANEL
BUN: 16 mg/dL (ref 6–23)
CO2: 25 mEq/L (ref 19–32)
Calcium: 9.3 mg/dL (ref 8.4–10.5)
Chloride: 106 mEq/L (ref 96–112)
Creatinine, Ser: 0.8 mg/dL (ref 0.4–1.5)
GFR: 100.94 mL/min (ref >60.00)
Glucose, Bld: 115 mg/dL — ABNORMAL HIGH (ref 70–99)
Potassium: 4.4 mEq/L (ref 3.5–5.1)
Sodium: 139 mEq/L (ref 135–145)

## 2012-05-26 LAB — TSH: TSH: 1.91 u[IU]/mL (ref 0.35–5.50)

## 2012-06-02 ENCOUNTER — Other Ambulatory Visit: Payer: Self-pay | Admitting: Cardiology

## 2012-06-02 MED ORDER — DILTIAZEM HCL ER COATED BEADS 240 MG PO CP24: 240.0000 mg | ORAL_CAPSULE | Freq: Every day | ORAL | Status: DC

## 2012-06-02 MED ORDER — PRAVASTATIN SODIUM 40 MG PO TABS: 40.0000 mg | ORAL_TABLET | Freq: Every evening | ORAL | Status: DC

## 2012-06-02 MED ORDER — RIVAROXABAN 20 MG PO TABS: 20.0000 mg | ORAL_TABLET | Freq: Every day | ORAL | Status: DC

## 2012-06-03 ENCOUNTER — Telehealth: Payer: Self-pay | Admitting: Cardiology

## 2012-06-03 MED ORDER — RIVAROXABAN 20 MG PO TABS: 20.0000 mg | ORAL_TABLET | Freq: Every day | ORAL | Status: DC

## 2012-06-03 NOTE — Telephone Encounter (Signed)
New msg Pt called about xaralto dosage. He thought he was supposed to take 20 mg but when he picked up from pharmacy the dosage was 15 mg Please call

## 2012-06-03 NOTE — Telephone Encounter (Signed)
Spoke with pt, he pick up his xarelto and wanted to confirm the dosage. He received the 15 mg size and actually needs to be on 20 mg daily. New script sent to pharm.

## 2012-07-01 ENCOUNTER — Ambulatory Visit (INDEPENDENT_AMBULATORY_CARE_PROVIDER_SITE_OTHER): Payer: Medicare Other | Admitting: Cardiology

## 2012-07-01 ENCOUNTER — Encounter: Payer: Self-pay | Admitting: Cardiology

## 2012-07-01 VITALS — BP 128/68 | HR 80 | Ht 68.0 in | Wt 256.0 lb

## 2012-07-01 DIAGNOSIS — I480 Paroxysmal atrial fibrillation: Secondary | ICD-10-CM

## 2012-07-01 DIAGNOSIS — I2510 Atherosclerotic heart disease of native coronary artery without angina pectoris: Secondary | ICD-10-CM

## 2012-07-01 DIAGNOSIS — R0989 Other specified symptoms and signs involving the circulatory and respiratory systems: Secondary | ICD-10-CM | POA: Diagnosis not present

## 2012-07-01 DIAGNOSIS — R0609 Other forms of dyspnea: Secondary | ICD-10-CM

## 2012-07-01 DIAGNOSIS — I2699 Other pulmonary embolism without acute cor pulmonale: Secondary | ICD-10-CM

## 2012-07-01 DIAGNOSIS — I10 Essential (primary) hypertension: Secondary | ICD-10-CM | POA: Diagnosis not present

## 2012-07-01 DIAGNOSIS — I4891 Unspecified atrial fibrillation: Secondary | ICD-10-CM | POA: Diagnosis not present

## 2012-07-01 DIAGNOSIS — E785 Hyperlipidemia, unspecified: Secondary | ICD-10-CM | POA: Diagnosis not present

## 2012-07-01 DIAGNOSIS — R0600 Dyspnea, unspecified: Secondary | ICD-10-CM

## 2012-07-01 NOTE — Assessment & Plan Note (Signed)
Continue statin. I will add aspirin after he has completed anticoagulation for his pulmonary embolus. Schedule Myoview for risk stratification.

## 2012-07-01 NOTE — Assessment & Plan Note (Signed)
Continue xeralto for a total of one year following previous event. His pulmonary embolus occurred after driving to [place].

## 2012-07-01 NOTE — Assessment & Plan Note (Signed)
Patient counseled on discontinuing. 

## 2012-07-01 NOTE — Patient Instructions (Addendum)
Your physician wants you to follow-up in: [place] will receive a reminder letter in the mail two months in advance. If you don't receive a letter, please call our office to schedule the follow-up appointment.   Your physician has requested that you have a lexiscan myoview. For further information please visit [email or web address]. Please follow instruction sheet, as given.   STOP ZETIA

## 2012-07-01 NOTE — Assessment & Plan Note (Signed)
Blood pressure controlled. Continue present medications. 

## 2012-07-01 NOTE — Assessment & Plan Note (Signed)
Patient remains in sinus rhythm. Continue present medications. I think his atrial fibrillation was related to the stress of his pulmonary embolus.

## 2012-07-01 NOTE — Assessment & Plan Note (Signed)
Continue Pravachol but discontinue zetia

## 2012-07-01 NOTE — Progress Notes (Signed)
HPI: 71 year old male I initially saw in May of 2013 for evaluation of atrial fibrillation. Patient states had PCI of LAD in 1988. Last myoview in 2007 showed EF 71 and no ischemia. Patient recently admitted with a pulmonary embolus. During hospitalization he developed atrial fibrillation with a rapid ventricular response. Troponins were negative. Echocardiogram in May of 2013 showed normal LV function and mild left ventricular hypertrophy. Patient treated with xeralto and cardizem and DCed. TSH in July 2013 1.91. Venous dopplers in June of 2013 with no DVT. Since I last saw him, there is some dyspnea on exertion but no orthopnea, PND, chest pain or syncope. Mild pedal edema.   Current Outpatient Prescriptions  Medication Sig Dispense Refill  . albuterol (PROVENTIL HFA;VENTOLIN HFA) 108 (90 BASE) MCG/ACT inhaler Inhale 2 puffs into the lungs every 6 (six) hours as needed.        . diltiazem (CARDIZEM CD) 240 MG 24 hr capsule Take 1 capsule (240 mg total) by mouth daily.  90 capsule  3  . ezetimibe (ZETIA) 10 MG tablet Take 10 mg by mouth daily.        . fexofenadine (ALLEGRA) 180 MG tablet Take 180 mg by mouth daily.        . fluticasone (FLONASE) 50 MCG/ACT nasal spray Place 2 sprays into the nose daily.        . Fluticasone-Salmeterol (ADVAIR) 250-50 MCG/DOSE AEPB Inhale 1 puff into the lungs every 12 (twelve) hours.        . gabapentin (NEURONTIN) 800 MG tablet Take 800 mg by mouth 3 (three) times daily.       Marland Kitchen glimepiride (AMARYL) 4 MG tablet Take 4 mg by mouth 2 (two) times daily.        Marland Kitchen guaiFENesin (MUCINEX) 600 MG 12 hr tablet Take 1,200 mg by mouth daily.      . hydrochlorothiazide (HYDRODIURIL) 12.5 MG tablet Take 12.5 mg by mouth daily.        Marland Kitchen HYDROcodone-acetaminophen (NORCO) 10-325 MG per tablet Take 1 tablet by mouth 2 (two) times daily. Am and hs       . lisinopril (PRINIVIL,ZESTRIL) 5 MG tablet Take 5 mg by mouth daily.      . metFORMIN (GLUCOPHAGE) 1000 MG tablet Take 1,000  mg by mouth 2 (two) times daily with a meal.        . metoCLOPramide (REGLAN) 10 MG tablet Take 10 mg by mouth 2 (two) times daily.        . Multiple Vitamin (MULTIVITAMIN) tablet Take 1 tablet by mouth daily. Centrum silver       . Omeprazole Magnesium (PRILOSEC OTC PO) Take by mouth. 1 tab in the am      . pioglitazone (ACTOS) 45 MG tablet Take 45 mg by mouth daily.        . pravastatin (PRAVACHOL) 40 MG tablet Take 1 tablet (40 mg total) by mouth every evening.  90 tablet  3  . ranitidine (ZANTAC) 150 MG capsule Take 150 mg by mouth 2 (two) times daily.      . Rivaroxaban (XARELTO) 20 MG TABS Take 1 tablet (20 mg total) by mouth daily.  90 tablet  3  . tiotropium (SPIRIVA) 18 MCG inhalation capsule Place 18 mcg into inhaler and inhale daily.           Past Medical History  Diagnosis Date  . Diabetes mellitus   . Hypertension   . COPD (chronic obstructive pulmonary disease)   .  Asthma   . GERD (gastroesophageal reflux disease)   . Osteopenia   . lung ca dx'd 01/2006  . Pulmonary embolus   . Atrial fibrillation   . Hyperlipidemia   . CAD (coronary artery disease)     Prior PTCA  . Nephrolithiasis     Past Surgical History  Procedure Date  . Lung cancer surgery 01/2006    Small excision of left lung tissue; mesh with radioactive seeds (per pt report)  . Cholecystectomy   . Tonsillectomy   . Angioplasty Approx 1993  . Cardiac catheterization Approximately 1993  . Fixation kyphoplasty     History   Social History  . Marital Status: Married    Spouse Name: N/A    Number of Children: 2  . Years of Education: N/A   Occupational History  .      Retired from [employer] and [employer]  . Smoking status: Current Everyday Smoker -- 1.0 packs/day  . Smokeless tobacco: Never Used  . Alcohol Use: Yes     Rare  . Drug Use: No  . Sexually Active: No   Other Topics Concern  . Not on file   Social History Narrative  . No narrative on file    ROS: no fevers  or chills, productive cough, hemoptysis, dysphasia, odynophagia, melena, hematochezia, dysuria, hematuria, rash, seizure activity, orthopnea, PND, pedal edema, claudication. Remaining systems are negative.  Physical Exam: Well-developed obese in no acute distress.  Skin is warm and dry.  HEENT is normal.  Neck is supple.  Chest is clear to auscultation with normal expansion.  Cardiovascular exam is regular rate and rhythm.  Abdominal exam nontender or distended. No masses palpated. Extremities show 1+ edema. neuro grossly intact  ECG sinus rhythm at a rate of 80. No ST changes.

## 2012-07-09 ENCOUNTER — Ambulatory Visit (HOSPITAL_COMMUNITY): Payer: Medicare Other | Attending: Cardiology | Admitting: Radiology

## 2012-07-09 VITALS — BP 111/51 | HR 75 | Ht 68.0 in | Wt 255.0 lb

## 2012-07-09 DIAGNOSIS — R0989 Other specified symptoms and signs involving the circulatory and respiratory systems: Secondary | ICD-10-CM | POA: Insufficient documentation

## 2012-07-09 DIAGNOSIS — R0602 Shortness of breath: Secondary | ICD-10-CM

## 2012-07-09 DIAGNOSIS — I2510 Atherosclerotic heart disease of native coronary artery without angina pectoris: Secondary | ICD-10-CM

## 2012-07-09 DIAGNOSIS — F17200 Nicotine dependence, unspecified, uncomplicated: Secondary | ICD-10-CM | POA: Insufficient documentation

## 2012-07-09 DIAGNOSIS — E119 Type 2 diabetes mellitus without complications: Secondary | ICD-10-CM | POA: Diagnosis not present

## 2012-07-09 DIAGNOSIS — I10 Essential (primary) hypertension: Secondary | ICD-10-CM | POA: Diagnosis not present

## 2012-07-09 DIAGNOSIS — R0609 Other forms of dyspnea: Secondary | ICD-10-CM

## 2012-07-09 DIAGNOSIS — I4891 Unspecified atrial fibrillation: Secondary | ICD-10-CM

## 2012-07-09 DIAGNOSIS — R0600 Dyspnea, unspecified: Secondary | ICD-10-CM

## 2012-07-09 MED ORDER — TECHNETIUM TC 99M TETROFOSMIN IV KIT
33.0000 | PACK | Freq: Once | INTRAVENOUS | Status: AC | PRN
  Administered 2012-07-09: 33 via INTRAVENOUS

## 2012-07-09 MED ORDER — TECHNETIUM TC 99M TETROFOSMIN IV KIT
11.0000 | PACK | Freq: Once | INTRAVENOUS | Status: AC | PRN
  Administered 2012-07-09: 11 via INTRAVENOUS

## 2012-07-09 MED ORDER — REGADENOSON 0.4 MG/5ML IV SOLN
0.4000 mg | Freq: Once | INTRAVENOUS | Status: AC
  Administered 2012-07-09: 0.4 mg via INTRAVENOUS

## 2012-07-09 NOTE — Progress Notes (Signed)
[address] [place] [address] [phone]  Cardiology Nuclear Med Study  Paul [name] is a 71 y.o. male     MRN : [id number]     DOB: January 10, 1941  Procedure Date: 07/09/2012  Nuclear Med Background Indication for Stress Test:  Evaluation for Ischemia and PTCA Patency History:  '88 PTCA; '07 MPS:No ischemia, EF=71%; 5/13 Hospital with PE and PAF w/RVR, (-) enzymes; 5/13 Echo:EF=60% Cardiac Risk Factors: Hypertension, Lipids, NIDDM and Smoker  Symptoms:  DOE   Nuclear Pre-Procedure Caffeine/Decaff Intake:  None > 12 hrs NPO After: 9:00pm   Lungs:  Clear. O2 Sat: 96% on room air. IV 0.9% NS with Angio Cath:  20g  IV Site: R Antecubital x 1, tolerated well IV Started by:  Irven Baltimore, RN  Chest Size (in):  48 Cup Size: n/a  Height: 5' 8" (1.727 m)  Weight:  255 lb (115.667 kg)  BMI:  Body mass index is 38.77 kg/(m^2). Tech Comments:  FBS was 99 at 6:00 am per patient;no diabetic po medication today.    Nuclear Med Study 1 or 2 day study: 1 day  Stress Test Type:  Treadmill/Lexiscan  Reading MD: Kirk Ruths, MD  Order Authorizing Provider:  Kirk Ruths, MD  Resting Radionuclide: Technetium 29mTetrofosmin  Resting Radionuclide Dose: 11.0 mCi   Stress Radionuclide:  Technetium 965metrofosmin  Stress Radionuclide Dose: 33.0 mCi           Stress Protocol Rest HR: 75 Stress HR: 118  Rest BP: 111/51 Stress BP: 160/49  Exercise Time (min): 2:00 METS: n/a   Predicted Max HR: 148 bpm % Max HR: 79.73 bpm Rate Pressure Product: 18880   Dose of Adenosine (mg):  n/a Dose of Lexiscan: 0.4 mg  Dose of Atropine (mg): n/a Dose of Dobutamine: n/a mcg/kg/min (at max HR)  Stress Test Technologist: ShLetta MoynahanCMA-N  Nuclear Technologist:  StCharlton AmorCNMT     Rest Procedure:  Myocardial perfusion imaging was performed at rest 45 minutes following the intravenous administration of Technetium 9958metrofosmin.  Rest ECG: No acute changes.  Stress Procedure:  The patient received IV Lexiscan 0.4 mg over 15-seconds with concurrent low level exercise and then Technetium 24m13mrofosmin was injected at 30-seconds while the patient continued walking one more minute. There were no significant changes with Lexiscan, rare PAC. Quantitative spect images were obtained after a 45-minute delay.  Stress ECG: Significant ST abnormalities consistent with ischemia.  QPS Raw Data Images:  Acquisition technically good; normal left ventricular size. Stress Images:  There is decreased uptake in the inferior wall. Rest Images:  There is decreased uptake in the inferior wall. Subtraction (SDS):  No evidence of ischemia. Transient Ischemic Dilatation (Normal <1.22):  1.00 Lung/Heart Ratio (Normal <0.45):  0.39  Quantitative Gated Spect Images QGS EDV:  91 ml QGS ESV:  26 ml  Impression Exercise Capacity:  Lexiscan with low level exercise. BP Response:  Normal blood pressure response. Clinical Symptoms:  There is chest pain and dyspnea. ECG Impression:  No significant ST segment change suggestive of ischemia. Comparison with Prior Nuclear Study: No images to compare  Overall Impression:  Normal stress nuclear study with a small, mild, fixed inferobasal defect consistent with thinning; no ischemia.  LV Ejection Fraction: 72%.  LV Wall Motion:  NL LV Function; NL Wall Motion  BriaKirk Ruths

## 2012-07-29 DIAGNOSIS — Z23 Encounter for immunization: Secondary | ICD-10-CM | POA: Diagnosis not present

## 2012-08-06 ENCOUNTER — Telehealth: Payer: Self-pay | Admitting: Cardiology

## 2012-08-06 DIAGNOSIS — E119 Type 2 diabetes mellitus without complications: Secondary | ICD-10-CM | POA: Diagnosis not present

## 2012-08-06 DIAGNOSIS — E669 Obesity, unspecified: Secondary | ICD-10-CM | POA: Diagnosis not present

## 2012-08-06 DIAGNOSIS — Z0000 Encounter for general adult medical examination without abnormal findings: Secondary | ICD-10-CM | POA: Diagnosis not present

## 2012-08-06 DIAGNOSIS — R5381 Other malaise: Secondary | ICD-10-CM | POA: Diagnosis not present

## 2012-08-06 DIAGNOSIS — R5383 Other fatigue: Secondary | ICD-10-CM | POA: Diagnosis not present

## 2012-08-06 DIAGNOSIS — Z125 Encounter for screening for malignant neoplasm of prostate: Secondary | ICD-10-CM | POA: Diagnosis not present

## 2012-08-06 DIAGNOSIS — R350 Frequency of micturition: Secondary | ICD-10-CM | POA: Diagnosis not present

## 2012-08-06 DIAGNOSIS — E7800 Pure hypercholesterolemia, unspecified: Secondary | ICD-10-CM | POA: Diagnosis not present

## 2012-08-06 DIAGNOSIS — Z8546 Personal history of malignant neoplasm of prostate: Secondary | ICD-10-CM | POA: Diagnosis not present

## 2012-08-06 DIAGNOSIS — I10 Essential (primary) hypertension: Secondary | ICD-10-CM | POA: Diagnosis not present

## 2012-08-06 NOTE — Telephone Encounter (Signed)
New problem:   Can he get his teeth clean knowing he's  Xarelto

## 2012-08-06 NOTE — Telephone Encounter (Signed)
Spoke with pt, okay given for dental cleaning.

## 2012-08-06 NOTE — Telephone Encounter (Signed)
Unable to reach pt or leave a message

## 2012-08-08 DIAGNOSIS — B351 Tinea unguium: Secondary | ICD-10-CM | POA: Diagnosis not present

## 2012-08-08 DIAGNOSIS — M79609 Pain in unspecified limb: Secondary | ICD-10-CM | POA: Diagnosis not present

## 2012-08-13 ENCOUNTER — Telehealth: Payer: Self-pay | Admitting: Cardiology

## 2012-08-13 NOTE — Telephone Encounter (Signed)
Spoke with pt, aware he does not need antibiotics prior to dental cleaning.

## 2012-08-13 NOTE — Telephone Encounter (Signed)
plz return call to pt [phone] regarding note and medication for 10/8 dental appnt .

## 2012-08-27 DIAGNOSIS — M545 Low back pain, unspecified: Secondary | ICD-10-CM | POA: Diagnosis not present

## 2012-09-29 DIAGNOSIS — I10 Essential (primary) hypertension: Secondary | ICD-10-CM | POA: Diagnosis not present

## 2012-09-29 DIAGNOSIS — R404 Transient alteration of awareness: Secondary | ICD-10-CM | POA: Diagnosis not present

## 2012-09-29 DIAGNOSIS — E119 Type 2 diabetes mellitus without complications: Secondary | ICD-10-CM | POA: Diagnosis not present

## 2012-11-07 DIAGNOSIS — B351 Tinea unguium: Secondary | ICD-10-CM | POA: Diagnosis not present

## 2012-11-07 DIAGNOSIS — M79609 Pain in unspecified limb: Secondary | ICD-10-CM | POA: Diagnosis not present

## 2012-11-13 ENCOUNTER — Telehealth: Payer: Self-pay | Admitting: Cardiology

## 2012-11-13 NOTE — Telephone Encounter (Signed)
Spoke with pt, he needs to know what to do with xarelto to have teeth pulled. Will forward for dr Stanford Breed review

## 2012-11-13 NOTE — Telephone Encounter (Signed)
Spoke with pt, Aware of dr crenshaw's recommendations.  °

## 2012-11-13 NOTE — Telephone Encounter (Signed)
New Problem:    Patient called in today wanting to know how he should proceed with a tooth removal procedure that is scheduled for today since he is on Rivaroxaban (XARELTO) 20 MG TABS.  Please call back.

## 2012-11-13 NOTE — Telephone Encounter (Signed)
Patient with pulmonary embolus in May of 2013. DC xeralto 2 days prior to procedure and resume the following day. Paul [name]

## 2012-12-04 DIAGNOSIS — E669 Obesity, unspecified: Secondary | ICD-10-CM | POA: Diagnosis not present

## 2012-12-04 DIAGNOSIS — J449 Chronic obstructive pulmonary disease, unspecified: Secondary | ICD-10-CM | POA: Diagnosis not present

## 2012-12-04 DIAGNOSIS — I10 Essential (primary) hypertension: Secondary | ICD-10-CM | POA: Diagnosis not present

## 2012-12-04 DIAGNOSIS — Z86711 Personal history of pulmonary embolism: Secondary | ICD-10-CM | POA: Diagnosis not present

## 2012-12-04 DIAGNOSIS — F17200 Nicotine dependence, unspecified, uncomplicated: Secondary | ICD-10-CM | POA: Diagnosis not present

## 2012-12-04 DIAGNOSIS — E119 Type 2 diabetes mellitus without complications: Secondary | ICD-10-CM | POA: Diagnosis not present

## 2012-12-04 DIAGNOSIS — E7800 Pure hypercholesterolemia, unspecified: Secondary | ICD-10-CM | POA: Diagnosis not present

## 2012-12-10 ENCOUNTER — Institutional Professional Consult (permissible substitution): Payer: Medicare Other | Admitting: Internal Medicine

## 2012-12-23 ENCOUNTER — Encounter: Payer: Self-pay | Admitting: Cardiology

## 2012-12-23 ENCOUNTER — Ambulatory Visit (INDEPENDENT_AMBULATORY_CARE_PROVIDER_SITE_OTHER): Payer: Medicare Other | Admitting: Cardiology

## 2012-12-23 VITALS — BP 120/78 | HR 70 | Wt 258.0 lb

## 2012-12-23 DIAGNOSIS — F17200 Nicotine dependence, unspecified, uncomplicated: Secondary | ICD-10-CM

## 2012-12-23 DIAGNOSIS — I10 Essential (primary) hypertension: Secondary | ICD-10-CM

## 2012-12-23 DIAGNOSIS — I2699 Other pulmonary embolism without acute cor pulmonale: Secondary | ICD-10-CM

## 2012-12-23 DIAGNOSIS — I2510 Atherosclerotic heart disease of native coronary artery without angina pectoris: Secondary | ICD-10-CM

## 2012-12-23 DIAGNOSIS — I4891 Unspecified atrial fibrillation: Secondary | ICD-10-CM

## 2012-12-23 DIAGNOSIS — E785 Hyperlipidemia, unspecified: Secondary | ICD-10-CM | POA: Diagnosis not present

## 2012-12-23 DIAGNOSIS — I480 Paroxysmal atrial fibrillation: Secondary | ICD-10-CM

## 2012-12-23 DIAGNOSIS — Z720 Tobacco use: Secondary | ICD-10-CM

## 2012-12-23 MED ORDER — ASPIRIN EC 81 MG PO TBEC: 81.0000 mg | DELAYED_RELEASE_TABLET | Freq: Every day | ORAL | Status: DC

## 2012-12-23 NOTE — Assessment & Plan Note (Signed)
Continue statin.

## 2012-12-23 NOTE — Assessment & Plan Note (Signed)
Blood pressure controlled. Continue present medications.

## 2012-12-23 NOTE — Assessment & Plan Note (Signed)
Resume aspirin now that he is off of anticoagulation. Continue statin. Recent functional study negative.

## 2012-12-23 NOTE — Assessment & Plan Note (Signed)
Patient counseled on discontinuing.

## 2012-12-23 NOTE — Assessment & Plan Note (Signed)
Atrial fibrillation occurred in the setting of his pulmonary embolus. I think it was most likely related to the stress of his acute illness. He remains in sinus.

## 2012-12-23 NOTE — Patient Instructions (Addendum)
Your physician wants you to follow-up in: [place] will receive a reminder letter in the mail two months in advance. If you don't receive a letter, please call our office to schedule the follow-up appointment.   STOP XARELTO  START ASPIRIN 81 MG ONCE DAILY WITH FOOD

## 2012-12-23 NOTE — Progress Notes (Signed)
HPI: Pleasant male I initially saw in May of 2013 for evaluation of atrial fibrillation. Patient states had PCI of LAD in 1988. Last myoview in August of 2013 showed an ejection fraction of 72%. There was inferobasal thinning but no ischemia. Patient previously admitted with a pulmonary embolus. During hospitalization he developed atrial fibrillation with a rapid ventricular response. Troponins were negative. Echocardiogram in May of 2013 showed normal LV function and mild left ventricular hypertrophy. Patient treated with xeralto and cardizem and DCed. TSH in July 2013 1.91. Venous dopplers in June of 2013 with no DVT. Since I last saw him in August of 2013, there is some dyspnea on exertion but no orthopnea, PND, chest pain or syncope. Mild pedal edema.   Current Outpatient Prescriptions  Medication Sig Dispense Refill  . albuterol (PROVENTIL HFA;VENTOLIN HFA) 108 (90 BASE) MCG/ACT inhaler Inhale 2 puffs into the lungs every 6 (six) hours as needed.        . diltiazem (CARDIZEM CD) 240 MG 24 hr capsule Take 1 capsule (240 mg total) by mouth daily.  90 capsule  3  . ezetimibe (ZETIA) 10 MG tablet Take 10 mg by mouth daily.      . fexofenadine (ALLEGRA) 180 MG tablet Take 180 mg by mouth daily.        . fluticasone (FLONASE) 50 MCG/ACT nasal spray Place 2 sprays into the nose daily.        . Fluticasone-Salmeterol (ADVAIR) 250-50 MCG/DOSE AEPB Inhale 1 puff into the lungs every 12 (twelve) hours.        . gabapentin (NEURONTIN) 800 MG tablet Take 800 mg by mouth 3 (three) times daily.       Marland Kitchen glimepiride (AMARYL) 4 MG tablet Take 4 mg by mouth 2 (two) times daily.        Marland Kitchen guaiFENesin (MUCINEX) 600 MG 12 hr tablet Take 1,200 mg by mouth daily.      . hydrochlorothiazide (HYDRODIURIL) 12.5 MG tablet Take 12.5 mg by mouth daily.        Marland Kitchen HYDROcodone-acetaminophen (NORCO) 10-325 MG per tablet Take 1 tablet by mouth 2 (two) times daily. Am and hs       . lisinopril (PRINIVIL,ZESTRIL) 5 MG tablet Take  5 mg by mouth daily.      . metFORMIN (GLUCOPHAGE) 1000 MG tablet Take 1,000 mg by mouth 2 (two) times daily with a meal.        . metoCLOPramide (REGLAN) 10 MG tablet Take 10 mg by mouth 2 (two) times daily.        . Multiple Vitamin (MULTIVITAMIN) tablet Take 1 tablet by mouth daily. Centrum silver       . pioglitazone (ACTOS) 45 MG tablet Take 45 mg by mouth daily.        . pravastatin (PRAVACHOL) 40 MG tablet Take 1 tablet (40 mg total) by mouth every evening.  90 tablet  3  . ranitidine (ZANTAC) 150 MG capsule Take 150 mg by mouth as needed.       . Rivaroxaban (XARELTO) 20 MG TABS Take 1 tablet (20 mg total) by mouth daily.  90 tablet  3  . tiotropium (SPIRIVA) 18 MCG inhalation capsule Place 18 mcg into inhaler and inhale daily.         No current facility-administered medications for this visit.     Past Medical History  Diagnosis Date  . Diabetes mellitus   . Hypertension   . COPD (chronic obstructive pulmonary disease)   .  Asthma   . GERD (gastroesophageal reflux disease)   . Osteopenia   . lung ca dx'd 01/2006  . Pulmonary embolus   . Atrial fibrillation   . Hyperlipidemia   . CAD (coronary artery disease)     Prior PTCA  . Nephrolithiasis     Past Surgical History  Procedure Laterality Date  . Lung cancer surgery  01/2006    Small excision of left lung tissue; mesh with radioactive seeds (per pt report)  . Cholecystectomy    . Tonsillectomy    . Angioplasty  Approx 1993  . Cardiac catheterization  Approximately 1993  . Fixation kyphoplasty      History   Social History  . Marital Status: Married    Spouse Name: N/A    Number of Children: 2  . Years of Education: N/A   Occupational History  .      Retired from [employer] and [employer]  . Smoking status: Current Every Day Smoker -- 1.00 packs/day  . Smokeless tobacco: Never Used  . Alcohol Use: Yes     Comment: Rare  . Drug Use: No  . Sexually Active: No   Other Topics Concern  .  Not on file   Social History Narrative  . No narrative on file    ROS: complains of rash, nausea, weakness, fatigue he feels are related to xeralto; no fevers or chills, productive cough, hemoptysis, dysphasia, odynophagia, melena, hematochezia, dysuria, hematuria, seizure activity, orthopnea, PND, claudication. Remaining systems are negative.  Physical Exam: Well-developed obese in no acute distress.  Skin is warm and dry.  HEENT is normal.  Neck is supple.  Chest is clear to auscultation with normal expansion.  Cardiovascular exam is regular rate and rhythm.  Abdominal exam nontender or distended. No masses palpated. Extremities show trace edema. neuro grossly intact  ECG sinus rhythm at a rate of 67. RV conduction delay. No ST changes.

## 2012-12-23 NOTE — Assessment & Plan Note (Signed)
Patient feels he is having side effects from xeralto. He is now 9 months from his pulmonary embolus. His risk factor is most likely driving to [place]. I think we can discontinue anticoagulation. He has been instructed on moving his legs and stopping on long trips. If he has another event in the future he will need long-term anticoagulation.

## 2012-12-24 ENCOUNTER — Ambulatory Visit (INDEPENDENT_AMBULATORY_CARE_PROVIDER_SITE_OTHER): Payer: Medicare Other | Admitting: Internal Medicine

## 2012-12-24 ENCOUNTER — Encounter: Payer: Self-pay | Admitting: Internal Medicine

## 2012-12-24 VITALS — BP 120/70 | HR 83 | Temp 99.7°F | Ht 70.0 in | Wt 259.6 lb

## 2012-12-24 DIAGNOSIS — J449 Chronic obstructive pulmonary disease, unspecified: Secondary | ICD-10-CM

## 2012-12-24 DIAGNOSIS — F17200 Nicotine dependence, unspecified, uncomplicated: Secondary | ICD-10-CM

## 2012-12-24 NOTE — Patient Instructions (Addendum)
Only use your albuterol (proaire) as a rescue medication (plan B)  to be used if you can't catch your breath by resting or doing a relaxed purse lip breathing pattern. The less you use it, the better it will work when you need it.   The key is to stop smoking completely before smoking completely stops you!   Please schedule a follow up office visit in 6 weeks, call sooner if needed with PFT's

## 2012-12-24 NOTE — Progress Notes (Signed)
  Subjective:    Patient ID: Paul [name], male    DOB: 03/10/1941  MRN: [id number]  HPI  55 yowm smoker with dx of copd with worse symptoms since 2013 referred to pulmonary clinic 12/24/2012 by Dr Darcus Austin for COPD evaluation    12/24/2012 1st pulmonary eval  On advair/ spiriva longterm with freq use of proventil prior to dx  PE presenting as pleurtic L CP May 2013 to  [facility] assoc with PAF and started on Xarelto and baseline doe x climbing steps but could walk flat all day long before and after PE but then indolent onset doe progressively worse x 9 months assoc with cough congestion > green mucus better p on levaquin x one week but convinced his coughing and breathing problems are all due to xarelto because of what he read on the package.  Cough assoc with sensation of nasal congestion and drainage but note absence of significant excess mucus.  No obvious daytime variabilty or assoc   cp or chest tightness, subjective wheeze overt sinus or hb symptoms. No unusual exp hx or h/o childhood pna/ asthma or premature birth to his knowledge.   Sleeping ok without nocturnal  or early am exacerbation  of respiratory  c/o's or need for noct saba. Also denies any obvious fluctuation of symptoms with weather or environmental changes or other aggravating or alleviating factors except as outlined above      Review of Systems  Constitutional: Negative for fever and unexpected weight change.  HENT: Positive for congestion, sneezing and dental problem. Negative for ear pain, nosebleeds, sore throat, rhinorrhea, trouble swallowing, postnasal drip and sinus pressure.   Eyes: Negative for redness and itching.  Respiratory: Positive for cough and shortness of breath. Negative for chest tightness and wheezing.   Cardiovascular: Positive for leg swelling. Negative for palpitations.  Gastrointestinal: Positive for abdominal pain. Negative for nausea and vomiting.  Genitourinary: Negative for dysuria.   Musculoskeletal: Negative for joint swelling.  Skin: Negative for rash.  Neurological: Negative for headaches.  Hematological: Does not bruise/bleed easily.  Psychiatric/Behavioral: Negative for dysphoric mood. The patient is not nervous/anxious.        Objective:   Physical Exam  Wt Readings from Last 3 Encounters:  12/24/12 259 lb 9.6 oz (117.754 kg)  12/23/12 258 lb (117.028 kg)  07/09/12 255 lb (115.667 kg)    HEENT mild turbinate edema.  Oropharynx no thrush or excess pnd or cobblestoning.  No JVD or cervical adenopathy. Mild accessory muscle hypertrophy. Trachea midline, nl thryroid. Chest was hyperinflated by percussion with diminished breath sounds and moderate increased exp time without wheeze. Hoover sign positive at mid inspiration. Regular rate and rhythm without murmur gallop or rub or increase P2 or edema.  Abd: no hsm, nl excursion. Ext warm without cyanosis or clubbing.    For CT Chest March 2014       Assessment & Plan:

## 2012-12-25 NOTE — Assessment & Plan Note (Addendum)
He has at least mild copd clinically plus ? Element of obesity/ deconditioning but most of his new symptoms correlate with what is probably  Classic Upper airway cough syndrome, so named because it's frequently impossible to sort out how much is  CR/sinusitis with freq throat clearing (which can be related to primary GERD)   vs  causing  secondary (" extra esophageal")  GERD from wide swings in gastric pressure that occur with throat clearing, often  promoting self use of mint and menthol lozenges that reduce the lower esophageal sphincter tone and exacerbate the problem further in a cyclical fashion.   These are the same pts (now being labeled as having "irritable larynx syndrome" by some cough centers) who not infrequently have a history of having failed to tolerate ace inhibitors,  dry powder inhalers or biphosphonates or report having atypical reflux symptoms that don't respond to standard doses of PPI , and are easily confused as having aecopd or asthma flares by even experienced allergists/ pulmonologists.   He is on acei but convinced since xarelto is the new medication he's been on since the onset of symptoms it couldn't be anything else.  For now rec trial off xarelto threrefore and then return to regroup with pft's after 6 weeks

## 2012-12-25 NOTE — Assessment & Plan Note (Signed)
>  3 min  I reviewed the Flethcher curve with patient that basically indicates  if you quit smoking when your best day FEV1 is still well preserved (which his appears to be) it is highly unlikely you will progress to severe disease and informed the patient there was no medication on the market that has proven to change the curve or the likelihood of progression.  Therefore stopping smoking and maintaining abstinence is the most important aspect of care, not choice of inhalers or for that matter, doctors.

## 2013-01-13 ENCOUNTER — Ambulatory Visit (HOSPITAL_COMMUNITY)
Admission: RE | Admit: 2013-01-13 | Discharge: 2013-01-13 | Disposition: A | Discharge status: Home / Self Care | Payer: Medicare Other | Source: Ambulatory Visit | Attending: Internal Medicine | Admitting: Internal Medicine

## 2013-01-13 ENCOUNTER — Other Ambulatory Visit (HOSPITAL_BASED_OUTPATIENT_CLINIC_OR_DEPARTMENT_OTHER): Payer: Medicare Other | Admitting: Lab

## 2013-01-13 DIAGNOSIS — I2510 Atherosclerotic heart disease of native coronary artery without angina pectoris: Secondary | ICD-10-CM | POA: Diagnosis not present

## 2013-01-13 DIAGNOSIS — I700 Atherosclerosis of aorta: Secondary | ICD-10-CM | POA: Insufficient documentation

## 2013-01-13 DIAGNOSIS — R109 Unspecified abdominal pain: Secondary | ICD-10-CM | POA: Diagnosis not present

## 2013-01-13 DIAGNOSIS — J438 Other emphysema: Secondary | ICD-10-CM | POA: Insufficient documentation

## 2013-01-13 DIAGNOSIS — C3490 Malignant neoplasm of unspecified part of unspecified bronchus or lung: Secondary | ICD-10-CM | POA: Diagnosis not present

## 2013-01-13 LAB — COMPREHENSIVE METABOLIC PANEL (CC13)
ALT: 23 U/L (ref 0–55)
AST: 18 U/L (ref 5–34)
Albumin: 3.6 g/dL (ref 3.5–5.0)
Alkaline Phosphatase: 45 U/L (ref 40–150)
BUN: 17.2 mg/dL (ref 7.0–26.0)
CO2: 22 mEq/L (ref 22–29)
Calcium: 9.5 mg/dL (ref 8.4–10.4)
Chloride: 105 mEq/L (ref 98–107)
Creatinine: 0.9 mg/dL (ref 0.7–1.3)
Glucose: 174 mg/dl — ABNORMAL HIGH (ref 70–99)
Potassium: 4.2 mEq/L (ref 3.5–5.1)
Sodium: 137 mEq/L (ref 136–145)
Total Bilirubin: 0.43 mg/dL (ref 0.20–1.20)
Total Protein: 6.7 g/dL (ref 6.4–8.3)

## 2013-01-13 LAB — CBC WITH DIFFERENTIAL/PLATELET
BASO%: 0.4 % (ref 0.0–2.0)
Basophils Absolute: 0 10*3/uL (ref 0.0–0.1)
EOS%: 2.7 % (ref 0.0–7.0)
Eosinophils Absolute: 0.2 10*3/uL (ref 0.0–0.5)
HCT: 40 % (ref 38.4–49.9)
HGB: 13.3 g/dL (ref 13.0–17.1)
LYMPH%: 18.5 % (ref 14.0–49.0)
MCH: 27.2 pg (ref 27.2–33.4)
MCHC: 33.3 g/dL (ref 32.0–36.0)
MCV: 81.8 fL (ref 79.3–98.0)
MONO#: 0.5 10*3/uL (ref 0.1–0.9)
MONO%: 7.1 % (ref 0.0–14.0)
NEUT#: 5.3 10*3/uL (ref 1.5–6.5)
NEUT%: 71.3 % (ref 39.0–75.0)
Platelets: 196 10*3/uL (ref 140–400)
RBC: 4.89 10*6/uL (ref 4.20–5.82)
RDW: 15.9 % — ABNORMAL HIGH (ref 11.0–14.6)
WBC: 7.5 10*3/uL (ref 4.0–10.3)
lymph#: 1.4 10*3/uL (ref 0.9–3.3)

## 2013-01-14 ENCOUNTER — Ambulatory Visit (HOSPITAL_BASED_OUTPATIENT_CLINIC_OR_DEPARTMENT_OTHER): Payer: Medicare Other | Admitting: Internal Medicine

## 2013-01-14 ENCOUNTER — Encounter: Payer: Self-pay | Admitting: Internal Medicine

## 2013-01-14 ENCOUNTER — Telehealth: Payer: Self-pay | Admitting: Internal Medicine

## 2013-01-14 VITALS — BP 152/75 | HR 80 | Temp 97.6°F | Resp 20 | Ht 70.0 in | Wt 260.6 lb

## 2013-01-14 DIAGNOSIS — C3430 Malignant neoplasm of lower lobe, unspecified bronchus or lung: Secondary | ICD-10-CM | POA: Diagnosis not present

## 2013-01-14 DIAGNOSIS — C3490 Malignant neoplasm of unspecified part of unspecified bronchus or lung: Secondary | ICD-10-CM

## 2013-01-14 NOTE — Telephone Encounter (Signed)
gv and printed appt schedule for pt for march 2015

## 2013-01-14 NOTE — Progress Notes (Signed)
[place] Telephone:([phone]) [phone]   Fax:(336) [phone]  OFFICE PROGRESS NOTE  Marjorie Smolder, MD [place] [address]  DIAGNOSIS: Stage IA (T1, N0, MX) non-small cell lung cancer, adenocarcinoma with bronchoalveolar features diagnosed in 01/2006.   PRIOR THERAPY: Status post wedge resection of the left lower lobe with node sampling and seed implants under the care of Dr. Arlyce Dice and Dr. Tammi Klippel on 02/05/2006.   CURRENT THERAPY: Observation.   INTERVAL HISTORY: Paul [name] 72 y.o. male returns to the clinic today for routine annual followup visit. The patient is feeling fine today with no specific complaints except for mild shortness breath with exertion unfortunately continues to smoke around 10 cigarettes every day and I strongly encouraged him to quit smoking. The patient denied having any significant chest pain, cough or hemoptysis. He denied having any significant weight loss or night sweats. He had repeat CT scan of the chest performed recently and he is here today for evaluation and discussion of his scan results.  MEDICAL HISTORY: Past Medical History  Diagnosis Date  . Diabetes mellitus   . Hypertension   . COPD (chronic obstructive pulmonary disease)   . Asthma   . GERD (gastroesophageal reflux disease)   . Osteopenia   . lung ca dx'd 01/2006  . Pulmonary embolus   . Atrial fibrillation   . Hyperlipidemia   . CAD (coronary artery disease)     Prior PTCA  . Nephrolithiasis     ALLERGIES:  is allergic to codeine and phenobarbital.  MEDICATIONS:  Current Outpatient Prescriptions  Medication Sig Dispense Refill  . albuterol (PROVENTIL HFA;VENTOLIN HFA) 108 (90 BASE) MCG/ACT inhaler Inhale 2 puffs into the lungs every 6 (six) hours as needed.        Marland Kitchen aspirin EC 81 MG tablet Take 1 tablet (81 mg total) by mouth daily.  90 tablet  3  . diltiazem (CARDIZEM CD) 240 MG 24 hr capsule Take 1 capsule (240 mg total) by mouth  daily.  90 capsule  3  . fexofenadine (ALLEGRA) 180 MG tablet Take 180 mg by mouth daily.        . fluticasone (FLONASE) 50 MCG/ACT nasal spray Place 2 sprays into the nose daily.        . Fluticasone-Salmeterol (ADVAIR) 250-50 MCG/DOSE AEPB Inhale 1 puff into the lungs every 12 (twelve) hours.        Marland Kitchen glimepiride (AMARYL) 4 MG tablet Take 4 mg by mouth 2 (two) times daily.        Marland Kitchen guaiFENesin (MUCINEX) 600 MG 12 hr tablet Take 1,200 mg by mouth daily.      . hydrochlorothiazide (HYDRODIURIL) 12.5 MG tablet Take 12.5 mg by mouth daily.        Marland Kitchen lisinopril (PRINIVIL,ZESTRIL) 5 MG tablet Take 5 mg by mouth daily.      . metFORMIN (GLUCOPHAGE) 1000 MG tablet Take 1,000 mg by mouth 2 (two) times daily with a meal.        . metoCLOPramide (REGLAN) 10 MG tablet Take 10 mg by mouth 2 (two) times daily.        . pioglitazone (ACTOS) 45 MG tablet Take 45 mg by mouth daily.        . pravastatin (PRAVACHOL) 40 MG tablet Take 1 tablet (40 mg total) by mouth every evening.  90 tablet  3  . ranitidine (ZANTAC) 150 MG capsule Take 150 mg by mouth as needed.       Marland Kitchen  tiotropium (SPIRIVA) 18 MCG inhalation capsule Place 18 mcg into inhaler and inhale daily.        . Multiple Vitamin (MULTIVITAMIN) tablet Take 1 tablet by mouth daily. Centrum silver        No current facility-administered medications for this visit.    SURGICAL HISTORY:  Past Surgical History  Procedure Laterality Date  . Lung cancer surgery  01/2006    Small excision of left lung tissue; mesh with radioactive seeds (per pt report)  . Cholecystectomy    . Tonsillectomy    . Angioplasty  Approx 1993  . Cardiac catheterization  Approximately 1993  . Fixation kyphoplasty      REVIEW OF SYSTEMS:  A comprehensive review of systems was negative except for: Respiratory: positive for dyspnea on exertion   PHYSICAL EXAMINATION: General appearance: alert, cooperative and no distress Head: Normocephalic, without obvious abnormality,  atraumatic Lymph nodes: Cervical, supraclavicular, and axillary nodes normal. Resp: clear to auscultation bilaterally Cardio: regular rate and rhythm, S1, S2 normal, no murmur, click, rub or gallop GI: soft, non-tender; bowel sounds normal; no masses,  no organomegaly Extremities: extremities normal, atraumatic, no cyanosis or edema  ECOG PERFORMANCE STATUS: 1 - Symptomatic but completely ambulatory  Blood pressure 152/75, pulse 80, temperature 97.6 F (36.4 C), temperature source Oral, resp. rate 20, height _0  (1.778 m), weight 260 lb 9.6 oz (118.207 kg).  LABORATORY DATA: Lab Results  Component Value Date   WBC 7.5 01/13/2013   HGB 13.3 01/13/2013   HCT 40.0 01/13/2013   MCV 81.8 01/13/2013   PLT 196 01/13/2013      Chemistry      Component Value Date/Time   NA 137 01/13/2013 0856   NA 139 05/26/2012 0901   NA 145 01/10/2012 0859   K 4.2 01/13/2013 0856   K 4.4 05/26/2012 0901   K 4.5 01/10/2012 0859   CL 105 01/13/2013 0856   CL 106 05/26/2012 0901   CL 98 01/10/2012 0859   CO2 22 01/13/2013 0856   CO2 25 05/26/2012 0901   CO2 27 01/10/2012 0859   BUN 17.2 01/13/2013 0856   BUN 16 05/26/2012 0901   BUN 19 01/10/2012 0859   CREATININE 0.9 01/13/2013 0856   CREATININE 0.8 05/26/2012 0901   CREATININE 0.7 01/10/2012 0859      Component Value Date/Time   CALCIUM 9.5 01/13/2013 0856   CALCIUM 9.3 05/26/2012 0901   CALCIUM 9.1 01/10/2012 0859   ALKPHOS 45 01/13/2013 0856   ALKPHOS 41 05/26/2012 0901   ALKPHOS 44 01/10/2012 0859   AST 18 01/13/2013 0856   AST 18 05/26/2012 0901   AST 23 01/10/2012 0859   ALT 23 01/13/2013 0856   ALT 23 05/26/2012 0901   BILITOT 0.43 01/13/2013 0856   BILITOT 0.4 05/26/2012 0901   BILITOT 0.50 01/10/2012 0859       RADIOGRAPHIC STUDIES: Ct Chest Wo Contrast  01/13/2013  *RADIOLOGY REPORT*  Clinical Data: Lung cancer status post left lower lobe wedge resection.  CT CHEST WITHOUT CONTRAST  Technique:  Multidetector CT imaging of the chest was performed following the standard  protocol without IV contrast.  Comparison: Chest CT 03/25/2012.  Findings:  Mediastinum: Heart size is normal. There is no significant pericardial fluid, thickening or pericardial calcification. There is atherosclerosis of the thoracic aorta, the great vessels of the mediastinum and the coronary arteries, including calcified atherosclerotic plaque in the left main, left anterior descending and right coronary arteries. No pathologically enlarged mediastinal or hilar  lymph nodes. Please note that accurate exclusion of hilar adenopathy is limited on noncontrast CT scans.     Esophagus is unremarkable in appearance.  Lungs/Pleura: Postoperative changes of a wedge resection are noted in the left lower lobe.  There is some postoperative architectural distortion and scarring in the medial aspect of the left lower lobe.  Multiple metallic densities are also noted in the posteromedial aspect of the lower left hemithorax, and likely represent brachytherapy implants.  There is no definite focal soft tissue mass in this region to suggest local recurrence of disease. No suspicious appearing pulmonary nodules or masses are noted elsewhere in the lungs.  There is a background of moderate centrilobular emphysema and mild diffuse bronchial wall thickening. No acute consolidative airspace disease.  No pleural effusions.  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  Musculoskeletal: There are no aggressive appearing lytic or blastic lesions noted in the visualized portions of the skeleton. Old vertebral body compression fracture at T12 with approximately 25% loss of anterior vertebral body height post vertebroplasty changes are again noted.  IMPRESSION: 1.  Post procedural changes of wedge resection and brachytherapy in the left lower  lobe without findings to suggest local recurrence of disease or new metastatic disease in the thorax. 2.  Moderate centrilobular emphysema and mild diffuse bronchial wall thickening; imaging findings compatible with underlying COPD. 3. Atherosclerosis, including left main and two-vessel coronary artery disease.   Assessment for potential risk factor modification, dietary therapy or pharmacologic therapy may be warranted, if clinically indicated. 4.  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   Original Report Authenticated By: Vinnie Langton, M.D.     ASSESSMENT: This is a very pleasant 72 years old white male with history of stage IA non-small cell lung cancer status post wedge resection of the left lower lobe with node sampling and seed implants. He has been observation since March of 2007 with no evidence for disease recurrence.   PLAN: I discussed the scan results with the patient today. I recommended for him to continue on observation with repeat CT scan of the chest without contrast in one-year. He was advised to call immediately if he has any concerning symptoms in the interval.  All questions were answered. The patient knows to call the clinic with any problems, questions or concerns. We can certainly see the patient much sooner if necessary.

## 2013-01-14 NOTE — Patient Instructions (Addendum)
No evidence for disease recurrence on the recent scan.  Followup in one year with repeat CT scan of the chest.

## 2013-01-15 DIAGNOSIS — L84 Corns and callosities: Secondary | ICD-10-CM | POA: Diagnosis not present

## 2013-02-05 ENCOUNTER — Encounter: Payer: Self-pay | Admitting: Internal Medicine

## 2013-02-05 ENCOUNTER — Ambulatory Visit (INDEPENDENT_AMBULATORY_CARE_PROVIDER_SITE_OTHER): Payer: Medicare Other | Admitting: Internal Medicine

## 2013-02-05 VITALS — BP 130/70 | HR 89 | Temp 97.7°F | Ht 69.5 in | Wt 266.0 lb

## 2013-02-05 DIAGNOSIS — J449 Chronic obstructive pulmonary disease, unspecified: Secondary | ICD-10-CM

## 2013-02-05 DIAGNOSIS — I10 Essential (primary) hypertension: Secondary | ICD-10-CM | POA: Diagnosis not present

## 2013-02-05 MED ORDER — PREDNISONE (PAK) 10 MG PO TABS: ORAL_TABLET | ORAL | Status: DC

## 2013-02-05 MED ORDER — LEVOFLOXACIN 750 MG PO TABS: 750.0000 mg | ORAL_TABLET | Freq: Every day | ORAL | Status: DC

## 2013-02-05 MED ORDER — LOSARTAN POTASSIUM 50 MG PO TABS: 50.0000 mg | ORAL_TABLET | Freq: Every day | ORAL | Status: DC

## 2013-02-05 MED ORDER — BUDESONIDE-FORMOTEROL FUMARATE 160-4.5 MCG/ACT IN AERO: INHALATION_SPRAY | RESPIRATORY_TRACT | Status: DC

## 2013-02-05 NOTE — Progress Notes (Signed)
  Subjective:    Patient ID: Paul [name], male    DOB: 07-30-1941  MRN: [id number]  HPI  18 yowm smoker with dx of copd with worse symptoms since 2013 referred to pulmonary clinic 12/24/2012 by Dr Paul [name] for COPD evaluation    12/24/2012 1st pulmonary eval  On advair/ spiriva longterm with freq use of proventil prior to dx  PE presenting as pleurtic L CP May 2013 to  [name] ass[facility] with PAF and started on Xarelto and baseline doe x climbing steps but could walk flat all day long before and after PE but then indolent onset doe progressively worse x 9 months assoc with cough congestion > green mucus better p on levaquin x one week but convinced his coughing and breathing problems are all due to xarelto because of what he read on the package. rec Only use your albuterol (proaire) as a rescue medication (plan B)  to be used if you can't catch your breath by resting or doing a relaxed purse lip breathing pattern. The less you use it, the better it will work when you need it.  The key is to stop smoking completely before smoking completely stops you!    .02/05/2013 f/u ov/[name] cc need less albuterol maint on advair and spiriva with worse cough and sob x mailbox and back and collapse in chair.   Mucus turned green again x 2 weeks prior to OV    No obvious daytime variabilty or assoc  cp or chest tightness, subjective wheeze overt sinus or hb symptoms. No unusual exp hx or h/o childhood pna/ asthma or premature birth to his knowledge.   Cough assoc with sensation of nasal congestion and drainage but note absence of significant excess mucus.  No obvious daytime variabilty or assoc   cp or chest tightness, subjective wheeze overt sinus or hb symptoms. No unusual exp hx or h/o childhood pna/ asthma or premature birth to his knowledge.   Sleeping ok without nocturnal  or early am exacerbation  of respiratory  c/o's or need for noct saba. Also denies any obvious fluctuation of symptoms with weather or  environmental changes or other aggravating or alleviating factors except as outlined above            Objective:   Physical Exam  02/05/2013  Wt 266  Wt Readings from Last 3 Encounters:  12/24/12 259 lb 9.6 oz (117.754 kg)  12/23/12 258 lb (117.028 kg)  07/09/12 255 lb (115.667 kg)    HEENT mild turbinate edema.  Oropharynx no thrush or excess pnd or cobblestoning.  No JVD or cervical adenopathy. Mild accessory muscle hypertrophy. Trachea midline, nl thryroid. Chest was hyperinflated by percussion with diminished breath sounds and moderate increased exp time without wheeze. Hoover sign positive at mid inspiration. Regular rate and rhythm without murmur gallop or rub or increase P2 or edema.  Abd: no hsm, nl excursion. Ext warm without cyanosis or clubbing.    01/13/13 ct chest    IMPRESSION:  1. Post procedural changes of wedge resection and brachytherapy in  the left lower lobe without findings to suggest local recurrence of  disease or new metastatic disease in the thorax.  2. Moderate centrilobular emphysema and mild diffuse bronchial  wall thickening; imaging findings compatible with underlying COPD       Assessment & Plan:

## 2013-02-05 NOTE — Patient Instructions (Addendum)
Stop advair and lisinopril  Start symbicort 160 Take 2 puffs first thing in am and then another 2 puffs about 12 hours later.    Only use your albuterol(proaire)  as a rescue medication to be used if you can't catch your breath by resting or doing a relaxed purse lip breathing pattern. The less you use it, the better it will work when you need it.   Prednisone 10 mg take  4 each am x 2 days,   2 each am x 2 days,  1 each am x2days and stop   Levaquin 750 x 5 days  For cough mucinex dm best as needed  Please schedule a follow up office visit in 4 weeks, sooner if needed pft's  Add ? Sinus ct if recurrent green mucus

## 2013-02-06 DIAGNOSIS — L608 Other nail disorders: Secondary | ICD-10-CM | POA: Diagnosis not present

## 2013-02-06 DIAGNOSIS — E1149 Type 2 diabetes mellitus with other diabetic neurological complication: Secondary | ICD-10-CM | POA: Diagnosis not present

## 2013-02-06 DIAGNOSIS — L84 Corns and callosities: Secondary | ICD-10-CM | POA: Diagnosis not present

## 2013-02-06 NOTE — Assessment & Plan Note (Addendum)
DDX of  difficult airways managment all start with A and  include Adherence, Ace Inhibitors, Acid Reflux, Active Sinus Disease, Alpha 1 Antitripsin deficiency, Anxiety masquerading as Airways dz,  ABPA,  allergy(esp in young), Aspiration (esp in elderly), Adverse effects of DPI,  Active smokers, plus two Bs  = Bronchiectasis and Beta blocker use..and one C= CHF  Adherence is always the initial "prime suspect" and is a multilayered concern that requires a "trust but verify" approach in every patient - starting with knowing how to use medications, especially inhalers, correctly, keeping up with refills and understanding the fundamental difference between maintenance and prns vs those medications only taken for a very short course and then stopped and not refilled. The proper method of use, as well as anticipated side effects, of a metered-dose inhaler are discussed and demonstrated to the patient. Improved effectiveness after extensive coaching during this visit to a level of approximately 75%  So try symbicort 160 2bid  ? acei case > trial off (see hbp)  ? Adverse effects of dpi, esp advair > try off  ? Active sinus dz > rx levaquin  consider sinus ct next if recurrent purulent bronchitis as no evidence bronchiectasis by CT this month

## 2013-02-06 NOTE — Assessment & Plan Note (Signed)
ACE inhibitors are problematic in  pts with airway complaints because  even experienced pulmonologists can't always distinguish ace effects from copd/asthma.  By themselves they don't actually cause a problem, much like oxygen can't by itself start a fire, but they certainly serve as a powerful catalyst or enhancer for any "fire"  or inflammatory process in the upper airway, be it caused by an ET  tube or more commonly reflux (especially in the obese or pts with known GERD or who are on biphoshonates).    In the era of ARB near equivalency until we have a better handle on the reversibility of the airway problem, it just makes sense to avoid ACEI  entirely in the short run and then decide later, having established a level of airway control using a reasonable limited regimen, whether to add back ace but even then being very careful to observe the pt for worsening airway control and number of meds used/ needed to control symptoms.   For now d/c acei and start cozaar 50 mg daily trial basis

## 2013-02-26 DIAGNOSIS — I10 Essential (primary) hypertension: Secondary | ICD-10-CM | POA: Diagnosis not present

## 2013-02-26 DIAGNOSIS — IMO0001 Reserved for inherently not codable concepts without codable children: Secondary | ICD-10-CM | POA: Diagnosis not present

## 2013-02-26 DIAGNOSIS — F17200 Nicotine dependence, unspecified, uncomplicated: Secondary | ICD-10-CM | POA: Diagnosis not present

## 2013-03-12 ENCOUNTER — Ambulatory Visit (INDEPENDENT_AMBULATORY_CARE_PROVIDER_SITE_OTHER): Payer: Medicare Other | Admitting: Internal Medicine

## 2013-03-12 ENCOUNTER — Ambulatory Visit: Payer: Medicare Other | Admitting: Internal Medicine

## 2013-03-12 DIAGNOSIS — J449 Chronic obstructive pulmonary disease, unspecified: Secondary | ICD-10-CM | POA: Diagnosis not present

## 2013-03-12 LAB — PULMONARY FUNCTION TEST

## 2013-03-12 NOTE — Progress Notes (Signed)
PFT Done. Hiller Bing, CMA

## 2013-03-16 ENCOUNTER — Telehealth: Payer: Self-pay | Admitting: *Deleted

## 2013-03-16 ENCOUNTER — Encounter: Payer: Self-pay | Admitting: Internal Medicine

## 2013-03-16 NOTE — Telephone Encounter (Signed)
Spoke with pt and notified of results per Dr. Melvyn Novas. Pt verbalized understanding and denied any questions. He already has appt pending on 03/26/13 and will keep this appt

## 2013-03-16 NOTE — Telephone Encounter (Signed)
Message copied by Rosana Berger on Mon Mar 16, 2013  3:10 PM ------      Message from: Christinia Gully B      Created: Mon Mar 16, 2013  1:40 PM       Let him know I reviewed pft's and they do show mild copd chanes as suspected and need f/u ov at some point in next 4-6 weeks to go over specifics and options for future rx ------

## 2013-03-16 NOTE — Telephone Encounter (Signed)
error 

## 2013-03-26 ENCOUNTER — Ambulatory Visit (INDEPENDENT_AMBULATORY_CARE_PROVIDER_SITE_OTHER): Payer: Medicare Other | Admitting: Internal Medicine

## 2013-03-26 ENCOUNTER — Encounter: Payer: Self-pay | Admitting: Internal Medicine

## 2013-03-26 VITALS — BP 138/62 | HR 85 | Temp 97.8°F | Ht 69.5 in | Wt 269.0 lb

## 2013-03-26 DIAGNOSIS — J449 Chronic obstructive pulmonary disease, unspecified: Secondary | ICD-10-CM

## 2013-03-26 DIAGNOSIS — I10 Essential (primary) hypertension: Secondary | ICD-10-CM | POA: Diagnosis not present

## 2013-03-26 DIAGNOSIS — F17200 Nicotine dependence, unspecified, uncomplicated: Secondary | ICD-10-CM

## 2013-03-26 MED ORDER — BUDESONIDE-FORMOTEROL FUMARATE 160-4.5 MCG/ACT IN AERO: INHALATION_SPRAY | RESPIRATORY_TRACT | Status: DC

## 2013-03-26 NOTE — Progress Notes (Signed)
Subjective:    Patient ID: Paul [name], male    DOB: [birth year]  MRN: [id number]  HPI  42 yowm smoker with dx of copd with worse symptoms since 2013 referred to pulmonary clinic 12/24/2012 by Dr Darcus Austin for COPD evaluation with GOLD II criteria May 2014   12/24/2012 1st pulmonary eval  On advair/ spiriva longterm with freq use of proventil prior to dx  PE presenting as pleurtic L CP May 2013 to  [facility] assoc with PAF and started on Xarelto and baseline doe x climbing steps but could walk flat all day long before and after PE but then indolent onset doe progressively worse x 9 months assoc with cough congestion > green mucus better p on levaquin x one week but convinced his coughing and breathing problems are all due to xarelto because of what he read on the package. rec Only use your albuterol (proaire) as a rescue medication (plan B)  to be used if you can't catch your breath   The key is to stop smoking completely before smoking completely stops you     02/05/2013 f/u ov/Loray Akard cc need less albuterol maint on advair and spiriva with worse cough and sob x mailbox and back and collapse in chair.   Mucus turned green again x 2 weeks prior to OV   rec Stop advair and lisinopril Start symbicort 160 Take 2 puffs first thing in am and then another 2 puffs about 12 hours later.  Only use your albuterol(proaire)  as a rescue medication to be used if you can't catch your breath by resting or doing a relaxed purse lip breathing pattern. The less you use it, the better it will work when you need it.  Prednisone 10 mg take  4 each am x 2 days,   2 each am x 2 days,  1 each am x2days and stop  Levaquin 750 x 5 days For cough mucinex dm best as needed  03/26/2013 f/u ov/Rigdon Macomber re copd Chief Complaint  Patient presents with  . Follow-up    Breathing is unchanged since the last visit. Ran out of symbicort so started back on advair about 2 wks ago. He could not tell difference between the 2 meds, but  spouse noted he did not cough as much on symbicort.   mucus is more day than night, usually not green   No obvious daytime variabilty or assoc  cp or chest tightness, subjective wheeze overt sinus or hb symptoms. No unusual exp hx or h/o childhood pna/ asthma or premature birth to his knowledge.   Cough assoc with sensation of nasal congestion and drainage but note absence of significant excess mucus.  No obvious daytime variabilty or assoc   cp or chest tightness, subjective wheeze overt sinus or hb symptoms. No unusual exp hx or h/o childhood pna/ asthma or premature birth to his knowledge.   Current Medications, Allergies, Past Medical History, Past Surgical History, Family History, and Social History were reviewed in [employer] record.  ROS  The following are not active complaints unless bolded sore throat, dysphagia, dental problems, itching, sneezing,  nasal congestion or excess/ purulent secretions, ear ache,   fever, chills, sweats, unintended wt loss, pleuritic or exertional cp, hemoptysis,  orthopnea pnd or leg swelling, presyncope, palpitations, heartburn, abdominal pain, anorexia, nausea, vomiting, diarrhea  or change in bowel or urinary habits, change in stools or urine, dysuria,hematuria,  rash, arthralgias, visual complaints, headache, numbness weakness or ataxia or problems with  walking or coordination,  change in mood/affect or memory.               Objective:   Physical Exam  02/05/2013  Wt 266 > 03/26/2013  269  Wt Readings from Last 3 Encounters:  12/24/12 259 lb 9.6 oz (117.754 kg)  12/23/12 258 lb (117.028 kg)  07/09/12 255 lb (115.667 kg)    HEENT mild turbinate edema.  Oropharynx no thrush or excess pnd or cobblestoning.  No JVD or cervical adenopathy. Mild accessory muscle hypertrophy. Trachea midline, nl thryroid. Chest was hyperinflated by percussion with diminished breath sounds and moderate increased exp time without wheeze. Hoover sign  positive at mid inspiration. Regular rate and rhythm without murmur gallop or rub or increase P2 or edema.  Abd: no hsm, nl excursion. Ext warm without cyanosis or clubbing.    01/13/13 ct chest    IMPRESSION:  1. Post procedural changes of wedge resection and brachytherapy in  the left lower lobe without findings to suggest local recurrence of  disease or new metastatic disease in the thorax.  2. Moderate centrilobular emphysema and mild diffuse bronchial  wall thickening; imaging findings compatible with underlying COPD       Assessment & Plan:

## 2013-03-26 NOTE — Assessment & Plan Note (Addendum)
Trial off acei effective 02/06/2013 > cough resolved   Adequate control on present rx, reviewed need to avoid acei so as not to confuse interpretation of non-specific respiratory complaints

## 2013-03-26 NOTE — Assessment & Plan Note (Signed)
>   3 min discussion  I took an extended  opportunity with this patient to outline the consequences of continued cigarette use  in airway disorders based on all the data we have from the multiple national lung health studies (perfomed over decades at millions of dollars in cost)  indicating that smoking cessation, not choice of inhalers or physicians, is the most important aspect of care.

## 2013-03-26 NOTE — Assessment & Plan Note (Addendum)
-  PFT's 03/12/13 FEV1  2.27 (79%) ratio 52 and no better p B2 DLCO 45 and 60% p correction  Change to just symbicort and no spiriva at this point and should do just as well  The proper method of use, as well as anticipated side effects, of a metered-dose inhaler are discussed and demonstrated to the patient. Improved effectiveness after extensive coaching during this visit to a level of approximately  90%   Active smoking discussed separately

## 2013-03-26 NOTE — Patient Instructions (Addendum)
Ok to stop spiriva  Work on Interior and spatial designer:  relax and gently blow all the way out then take a nice smooth deep breath back in, triggering the inhaler at same time you start breathing in.  Hold for up to 5 seconds if you can.  Rinse and gargle with water when done   If your mouth or throat starts to bother you,   I suggest you time the inhaler to your dental care and after using the inhaler(s) brush teeth and tongue with a baking soda containing toothpaste and when you rinse this out, gargle with it first to see if this helps your mouth and throat.   Call if drainage issues and throat congestion persist  For sinus CT and follow up office visit - call [facility] at [phone] to set this up

## 2013-03-27 ENCOUNTER — Encounter: Payer: Self-pay | Admitting: Internal Medicine

## 2013-03-31 ENCOUNTER — Other Ambulatory Visit: Payer: Self-pay | Admitting: *Deleted

## 2013-03-31 MED ORDER — LOSARTAN POTASSIUM 50 MG PO TABS: 50.0000 mg | ORAL_TABLET | Freq: Every day | ORAL | Status: DC

## 2013-04-21 ENCOUNTER — Ambulatory Visit: Payer: Medicare Other | Admitting: Internal Medicine

## 2013-04-22 ENCOUNTER — Telehealth: Payer: Self-pay | Admitting: Neurology

## 2013-04-22 NOTE — Telephone Encounter (Signed)
Called patient and spoke to him about RX. Patient is just making sure he is ahead of his self. As far as his insurance. Patient states he knows that [employer] will not  cover his Garlise. But they will cover Gabapentin. I will come and see Dr. Krista Blue before we switch to new Insurance.  Tried to get patient scheduled for follow up. Patient states that he will call the office back closer to time. Patient is fine as of now.

## 2013-04-22 NOTE — Telephone Encounter (Signed)
Patient Paul [name] is calling to tell us his new [employer] health policy is changing what they cover in terms of his Grabise sp? [place].  He tells me his insurance will cover the Gabapentin, which he has taken in the past.  He has a conference on May 18, 2013 to speak with his health insurance provider to go over his meds and so on.  He would like to hear back from Dr. Krista Blue if at all possible before May 18, 2013 to see if he can go back on the Gabapentin.  You can reach him at [phone].

## 2013-05-08 DIAGNOSIS — Q828 Other specified congenital malformations of skin: Secondary | ICD-10-CM | POA: Diagnosis not present

## 2013-05-08 DIAGNOSIS — E1149 Type 2 diabetes mellitus with other diabetic neurological complication: Secondary | ICD-10-CM | POA: Diagnosis not present

## 2013-05-08 DIAGNOSIS — L608 Other nail disorders: Secondary | ICD-10-CM | POA: Diagnosis not present

## 2013-05-11 DIAGNOSIS — M48061 Spinal stenosis, lumbar region without neurogenic claudication: Secondary | ICD-10-CM | POA: Diagnosis not present

## 2013-05-11 DIAGNOSIS — G894 Chronic pain syndrome: Secondary | ICD-10-CM | POA: Diagnosis not present

## 2013-05-11 DIAGNOSIS — M545 Low back pain, unspecified: Secondary | ICD-10-CM | POA: Diagnosis not present

## 2013-06-01 DIAGNOSIS — H5231 Anisometropia: Secondary | ICD-10-CM | POA: Diagnosis not present

## 2013-06-01 DIAGNOSIS — E119 Type 2 diabetes mellitus without complications: Secondary | ICD-10-CM | POA: Diagnosis not present

## 2013-06-01 DIAGNOSIS — H524 Presbyopia: Secondary | ICD-10-CM | POA: Diagnosis not present

## 2013-06-01 DIAGNOSIS — H52229 Regular astigmatism, unspecified eye: Secondary | ICD-10-CM | POA: Diagnosis not present

## 2013-06-10 DIAGNOSIS — IMO0001 Reserved for inherently not codable concepts without codable children: Secondary | ICD-10-CM | POA: Diagnosis not present

## 2013-06-10 DIAGNOSIS — I10 Essential (primary) hypertension: Secondary | ICD-10-CM | POA: Diagnosis not present

## 2013-08-14 DIAGNOSIS — Z23 Encounter for immunization: Secondary | ICD-10-CM | POA: Diagnosis not present

## 2013-09-03 DIAGNOSIS — J441 Chronic obstructive pulmonary disease with (acute) exacerbation: Secondary | ICD-10-CM | POA: Diagnosis not present

## 2013-09-03 DIAGNOSIS — J45901 Unspecified asthma with (acute) exacerbation: Secondary | ICD-10-CM | POA: Diagnosis not present

## 2013-09-12 ENCOUNTER — Other Ambulatory Visit: Payer: Self-pay | Admitting: Neurology

## 2013-09-15 NOTE — Telephone Encounter (Signed)
Per 10/21/2012 OV notes and new insurance coverage.

## 2013-09-16 DIAGNOSIS — E7800 Pure hypercholesterolemia, unspecified: Secondary | ICD-10-CM | POA: Diagnosis not present

## 2013-09-16 DIAGNOSIS — J449 Chronic obstructive pulmonary disease, unspecified: Secondary | ICD-10-CM | POA: Diagnosis not present

## 2013-09-16 DIAGNOSIS — IMO0001 Reserved for inherently not codable concepts without codable children: Secondary | ICD-10-CM | POA: Diagnosis not present

## 2013-09-16 DIAGNOSIS — I10 Essential (primary) hypertension: Secondary | ICD-10-CM | POA: Diagnosis not present

## 2013-09-28 ENCOUNTER — Telehealth: Payer: Self-pay | Admitting: Neurology

## 2013-09-29 MED ORDER — GABAPENTIN (ONCE-DAILY) 600 MG PO TABS: 3.0000 | ORAL_TABLET | Freq: Every day | ORAL | Status: DC

## 2013-09-29 NOTE — Telephone Encounter (Signed)
By viewing the chart, this was already taken care of on 11/01.  I have resent the Rx.  There is a note in the chart from the patient saying he will be changing ins, and they do not cover Gralise, but will cover Gabapentin.  He may discuss med change at OV next month.

## 2013-10-20 ENCOUNTER — Ambulatory Visit (INDEPENDENT_AMBULATORY_CARE_PROVIDER_SITE_OTHER): Payer: Medicare Other

## 2013-10-20 VITALS — BP 124/62 | HR 70 | Resp 20 | Ht 69.5 in | Wt 255.0 lb

## 2013-10-20 DIAGNOSIS — E1140 Type 2 diabetes mellitus with diabetic neuropathy, unspecified: Secondary | ICD-10-CM

## 2013-10-20 DIAGNOSIS — L608 Other nail disorders: Secondary | ICD-10-CM | POA: Diagnosis not present

## 2013-10-20 DIAGNOSIS — E1149 Type 2 diabetes mellitus with other diabetic neurological complication: Secondary | ICD-10-CM | POA: Diagnosis not present

## 2013-10-20 DIAGNOSIS — M2040 Other hammer toe(s) (acquired), unspecified foot: Secondary | ICD-10-CM | POA: Diagnosis not present

## 2013-10-20 DIAGNOSIS — E1142 Type 2 diabetes mellitus with diabetic polyneuropathy: Secondary | ICD-10-CM

## 2013-10-20 DIAGNOSIS — E11610 Type 2 diabetes mellitus with diabetic neuropathic arthropathy: Secondary | ICD-10-CM

## 2013-10-20 DIAGNOSIS — Q828 Other specified congenital malformations of skin: Secondary | ICD-10-CM | POA: Diagnosis not present

## 2013-10-20 DIAGNOSIS — M1990 Unspecified osteoarthritis, unspecified site: Secondary | ICD-10-CM

## 2013-10-20 NOTE — Progress Notes (Signed)
   Subjective:    Patient ID: Paul [name], male    DOB: 1941/08/14, 72 y.o.   MRN: [id number] "I have an issue with one toe he needs to look at.  I have a blister at the end of my toe, middle toe left foot.  He may need to do something with the insert again.  He put an indent on my other insole. All my toenails do not need to be cut.  He made me bleed last time and I'm Diabetic and that shouldn't happen."  HPI    Review of Systems no new findings or changes     Objective:   Physical Exam Vascular status is intact as follows DP +2/4 bilateral PT plus one over 4 bilateral. Refill timed 3-4 seconds all digits. Skin temperature warm turgor normal. No edema noted. Neurologically epicritic and proprioceptive sensations intact and symmetric bilateral however there is decreased sensation Semmes Weinstein testing to the forefoot and plantar digits bilateral. Dermatologically skin color pigment normal hair growth absent nails thick brittle friable crumbly and discolored 1 through 5 bilateral following debridement the first right and second left treated with lumicain and Neosporin and Band-Aid applied to the right hallux. Patient has severe incurvation of the nails noted secondary onychomycosis did discuss topical antifungal such as Fungi-Nail OTC is an option. Patient also is keratoses distal tuft third digit left foot secondary to hammertoe deformity and contracture. Patient also is keratoses sub-first metatarsal base and cuneiform with Charcot-type changes arthropathy the midfoot being noted.       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 At this time dystrophic friable criptotic nails debrided x10 the presence of diabetes. Also debridement multiple keratoses left foot distal third toe and sub-first metatarsal base and cuneiform modified and adjusted diabetic insoles with pocketing for they plantar arch prominence tenderness which can  you orthoses at this time followup in 3 months for continued palliative care nails debrided x10 Neosporin and Band-Aid applied the hallux  Harriet Masson DPM

## 2013-10-20 NOTE — Patient Instructions (Signed)
Diabetes and Foot Care Diabetes may cause you to have problems because of poor blood supply (circulation) to your feet and legs. This may cause the skin on your feet to become thinner, break easier, and heal more slowly. Your skin may become dry, and the skin may peel and crack. You may also have nerve damage in your legs and feet causing decreased feeling in them. You may not notice minor injuries to your feet that could lead to infections or more serious problems. Taking care of your feet is one of the most important things you can do for yourself.  HOME CARE INSTRUCTIONS  Wear shoes at all times, even in the house. Do not go barefoot. Bare feet are easily injured.  Check your feet daily for blisters, cuts, and redness. If you cannot see the bottom of your feet, use a mirror or ask someone for help.  Wash your feet with warm water (do not use hot water) and mild soap. Then pat your feet and the areas between your toes until they are completely dry. Do not soak your feet as this can dry your skin.  Apply a moisturizing lotion or petroleum jelly (that does not contain alcohol and is unscented) to the skin on your feet and to dry, brittle toenails. Do not apply lotion between your toes.  Trim your toenails straight across. Do not dig under them or around the cuticle. File the edges of your nails with an emery board or nail file.  Do not cut corns or calluses or try to remove them with medicine.  Wear clean socks or stockings every day. Make sure they are not too tight. Do not wear knee-high stockings since they may decrease blood flow to your legs.  Wear shoes that fit properly and have enough cushioning. To break in new shoes, wear them for just a few hours a day. This prevents you from injuring your feet. Always look in your shoes before you put them on to be sure there are no objects inside.  Do not cross your legs. This may decrease the blood flow to your feet.  If you find a minor scrape,  cut, or break in the skin on your feet, keep it and the skin around it clean and dry. These areas may be cleansed with mild soap and water. Do not cleanse the area with peroxide, alcohol, or iodine.  When you remove an adhesive bandage, be sure not to damage the skin around it.  If you have a wound, look at it several times a day to make sure it is healing.  Do not use heating pads or hot water bottles. They may burn your skin. If you have lost feeling in your feet or legs, you may not know it is happening until it is too late.  Make sure your health care provider performs a complete foot exam at least annually or more often if you have foot problems. Report any cuts, sores, or bruises to your health care provider immediately. SEEK MEDICAL CARE IF:   You have an injury that is not healing.  You have cuts or breaks in the skin.  You have an ingrown nail.  You notice redness on your legs or feet.  You feel burning or tingling in your legs or feet.  You have pain or cramps in your legs and feet.  Your legs or feet are numb.  Your feet always feel cold. SEEK IMMEDIATE MEDICAL CARE IF:   There is increasing redness,   swelling, or pain in or around a wound.  There is a red line that goes up your leg.  Pus is coming from a wound.  You develop a fever or as directed by your health care provider.  You notice a bad smell coming from an ulcer or wound. Document Released: 10/26/2000 Document Revised: 07/01/2013 Document Reviewed: 04/07/2013 [facility] Patient Information 2014 [place].

## 2013-10-21 ENCOUNTER — Telehealth: Payer: Self-pay | Admitting: Internal Medicine

## 2013-10-21 MED ORDER — BUDESONIDE-FORMOTEROL FUMARATE 160-4.5 MCG/ACT IN AERO: INHALATION_SPRAY | RESPIRATORY_TRACT | Status: DC

## 2013-10-21 MED ORDER — LOSARTAN POTASSIUM 50 MG PO TABS: 50.0000 mg | ORAL_TABLET | Freq: Every day | ORAL | Status: AC

## 2013-10-21 NOTE — Telephone Encounter (Signed)
Rx's have been sent in. Pt is aware.

## 2013-10-22 ENCOUNTER — Other Ambulatory Visit: Payer: Self-pay

## 2013-10-22 MED ORDER — DILTIAZEM HCL ER COATED BEADS 240 MG PO CP24: 240.0000 mg | ORAL_CAPSULE | Freq: Every day | ORAL | Status: DC

## 2013-10-22 MED ORDER — PRAVASTATIN SODIUM 40 MG PO TABS: 40.0000 mg | ORAL_TABLET | Freq: Every evening | ORAL | Status: DC

## 2013-10-26 ENCOUNTER — Encounter: Payer: Self-pay | Admitting: Neurology

## 2013-10-27 ENCOUNTER — Ambulatory Visit (INDEPENDENT_AMBULATORY_CARE_PROVIDER_SITE_OTHER): Payer: Medicare Other | Admitting: Neurology

## 2013-10-27 ENCOUNTER — Encounter: Payer: Self-pay | Admitting: Neurology

## 2013-10-27 VITALS — BP 116/68 | HR 74 | Ht 69.0 in | Wt 255.0 lb

## 2013-10-27 DIAGNOSIS — I4891 Unspecified atrial fibrillation: Secondary | ICD-10-CM

## 2013-10-27 DIAGNOSIS — I2510 Atherosclerotic heart disease of native coronary artery without angina pectoris: Secondary | ICD-10-CM | POA: Diagnosis not present

## 2013-10-27 DIAGNOSIS — I480 Paroxysmal atrial fibrillation: Secondary | ICD-10-CM

## 2013-10-27 DIAGNOSIS — J449 Chronic obstructive pulmonary disease, unspecified: Secondary | ICD-10-CM

## 2013-10-27 DIAGNOSIS — E1165 Type 2 diabetes mellitus with hyperglycemia: Secondary | ICD-10-CM | POA: Diagnosis not present

## 2013-10-27 DIAGNOSIS — IMO0002 Reserved for concepts with insufficient information to code with codable children: Secondary | ICD-10-CM

## 2013-10-27 DIAGNOSIS — E785 Hyperlipidemia, unspecified: Secondary | ICD-10-CM

## 2013-10-27 DIAGNOSIS — I2699 Other pulmonary embolism without acute cor pulmonale: Secondary | ICD-10-CM

## 2013-10-27 DIAGNOSIS — I10 Essential (primary) hypertension: Secondary | ICD-10-CM

## 2013-10-27 MED ORDER — GABAPENTIN 600 MG PO TABS: 600.0000 mg | ORAL_TABLET | Freq: Four times a day (QID) | ORAL | Status: DC

## 2013-10-27 NOTE — Progress Notes (Signed)
[facility]  PATIENT: Paul [name] DOB: [birth year]  HISTORICAL  Mr. Shughart is a 72 -year-old right-handed Caucasian male, came in to followup his peripheral neuropathy, clinical visit was November 2013,   He has past medical history of obesity, hypertension, poorly controlled diabetes, flatfeet, presenting with few years history of gradual onset, very slowly progressing, length dependent  axonal peripheral neuropathy, which is confirmed by electrodiagnostic study, mild to moderately severe, also complicated by his lifelong history of flatfeet.  He continues to do well, with his bilateral feet parethesia,  well controlled by Neurontin 300 mg  +3 tablets 875m every night. He complains of  daytime sleepiness, dozing off while watching TV at 7 clock each evening,  he reports previous history of sleep study, decline repeat study. Independent in ADL's, driving without problems. One  fall in the past year,  going up over a curb,  now using cane. No new neurologic complaints.   He is taking Neurontin 8084m3 tabs qhs,  he is mildly symptomatic of his peripheral neuropathy from his current management, he has bilateral flat feet, mild gait difficulty, he also complains of irregular sleep pattern, he went to bed around 10 PM, after taking his Neurontin 800 mg 3 tablets, he was able to sleep 3-4 hours, afterwards, he needs to use the bathroom, sleep another to 3 hours, he become widely awake at 5 AM, difficulty falling back to sleep again, but after breakfast, he took to 3 hours nap in the morning, he is not exercising regularly  Update October 28 2013,  After taking his Neurontin, 600 mg 3 tablets every night, his symptoms is under reasonable control, he also has a history of lumbar compression fracture in the past, status post vertebroplasty, but he continued to have mild low back pain, gait difficulty,  REVIEW OF SYSTEMS: Full 14 system review of systems performed and  notable only for cough, wheezing, shortness of breath, allergy,  ALLERGIES: Allergies  Allergen Reactions  . Codeine Anaphylaxis  . Phenobarbital Hypertension    HOME MEDICATIONS: Outpatient Prescriptions Prior to Visit  Medication Sig Dispense Refill  . acetaminophen (TYLENOL) 500 MG tablet Take 500 mg by mouth every 6 (six) hours as needed for pain.      . Marland Kitchenlbuterol (PROVENTIL HFA;VENTOLIN HFA) 108 (90 BASE) MCG/ACT inhaler Inhale 2 puffs into the lungs every 6 (six) hours as needed.        . Marland Kitchenspirin EC 81 MG tablet Take 1 tablet (81 mg total) by mouth daily.  90 tablet  3  . budesonide-formoterol (SYMBICORT) 160-4.5 MCG/ACT inhaler Take 2 puffs first thing in am and then another 2 puffs about 12 hours later.  3 Inhaler  0  . diltiazem (CARDIZEM CD) 240 MG 24 hr capsule Take 1 capsule (240 mg total) by mouth daily.  90 capsule  0  . fexofenadine (ALLEGRA) 180 MG tablet Take 180 mg by mouth daily.        . fluticasone (FLONASE) 50 MCG/ACT nasal spray Place 2 sprays into the nose daily.        . Gabapentin, PHN, (GRALISE) 600 MG TABS Take 3 tablets by mouth at bedtime.  270 tablet  0  . glimepiride (AMARYL) 4 MG tablet Take 4 mg by mouth 2 (two) times daily.        . Marland KitchenuaiFENesin (MUCINEX) 600 MG 12 hr tablet Take 1,200 mg by mouth daily.      . hydrochlorothiazide (HYDRODIURIL) 12.5 MG tablet Take 12.5  mg by mouth daily.        Marland Kitchen levofloxacin (LEVAQUIN) 750 MG tablet as needed.       Marland Kitchen losartan (COZAAR) 50 MG tablet Take 1 tablet (50 mg total) by mouth daily.  90 tablet  0  . metFORMIN (GLUCOPHAGE) 1000 MG tablet Take 1,000 mg by mouth 2 (two) times daily with a meal.        . metoCLOPramide (REGLAN) 10 MG tablet       . Multiple Vitamin (MULTIVITAMIN) tablet Take 1 tablet by mouth daily. Centrum silver       . pioglitazone (ACTOS) 45 MG tablet Take 45 mg by mouth daily.        . pravastatin (PRAVACHOL) 40 MG tablet Take 1 tablet (40 mg total) by mouth every evening.  90 tablet  0  .  gabapentin (NEURONTIN) 600 MG tablet 3 tablets at bedtime      . SPIRIVA HANDIHALER 18 MCG inhalation capsule        No facility-administered medications prior to visit.    PAST MEDICAL HISTORY: Past Medical History  Diagnosis Date  . Diabetes mellitus   . Hypertension   . COPD (chronic obstructive pulmonary disease)   . Asthma   . GERD (gastroesophageal reflux disease)   . Osteopenia   . lung ca dx'd 01/2006  . Pulmonary embolus   . Atrial fibrillation   . Hyperlipidemia   . CAD (coronary artery disease)     Prior PTCA  . Nephrolithiasis   . Tremor, essential     PAST SURGICAL HISTORY: Past Surgical History  Procedure Laterality Date  . Lung cancer surgery  01/2006    Small excision of left lung tissue; mesh with radioactive seeds (per pt report)  . Cholecystectomy    . Tonsillectomy    . Angioplasty  Approx 1993  . Cardiac catheterization  Approximately 1993  . Fixation kyphoplasty      FAMILY HISTORY: Family History  Problem Relation Age of Onset  . Heart disease Mother   . Tremor Mother   . Heart Problems Father   . Dementia Mother     SOCIAL HISTORY:  History   Social History  . Marital Status: Married    Spouse Name: Santiago Glad    Number of Children: 2  . Years of Education: college   Occupational History  .      Retired from [employer]  . Smoking status: Current Every Day Smoker -- 0.50 packs/day for 59 years    Types: Cigarettes  . Smokeless tobacco: Never Used  . Alcohol Use: 0.6 oz/week    1 Glasses of wine per week     Comment: Rare  . Drug Use: No  . Sexual Activity: No   Other Topics Concern  . Not on file   Social History Narrative   Patient lives at home with his wife Santiago Glad).   Retired.   Education- College    Right handed.   Caffeine- three cups of coffee daily.     PHYSICAL EXAM   Filed Vitals:   10/27/13 1436  BP: 116/68  Pulse: 74  Height: _0  (1.753 m)  Weight: 255 lb (115.667 kg)     Not recorded    Body mass index is 37.64 kg/(m^2).   Generalized: In no acute distress  Neck: Supple, no carotid bruits   Cardiac: Regular rate rhythm  Pulmonary: Clear to auscultation bilaterally  Musculoskeletal: No deformity  Neurological  examination  Mentation: Alert oriented to time, place, history taking, and causual conversation  Cranial nerve II-XII: Pupils were equal round reactive to light extraocular movements were full, Visual field were full on confrontational test. Bilateral fundi were sharp.  Facial sensation and strength were normal. Hearing was intact to finger rubbing bilaterally. Uvula tongue midline.  head turning and shoulder shrug and were normal and symmetric.Tongue protrusion into cheek strength was normal.  Motor: mild bilateral ankle dorsiflexion weakness.  Sensory: length dependent decreased to fine touch, pinprick to midshin, absent toe vibratory sensation, and proprioception at toes.  Coordination: Normal finger to nose, heel-to-shin bilaterally there was no truncal ataxia  Gait: Rising up from seated position by pushing on chair arm, flat foot, mild bilateral foot drop, unsteady gait  Romberg signs: Negative  Deep tendon reflexes: Brachioradialis 2/2, biceps 2/2, triceps 2/2, patellar 1/1, Achilles absent, plantar responses were flexor bilaterally.   DIAGNOSTIC DATA (LABS, IMAGING, TESTING) - I reviewed patient records, labs, notes, testing and imaging myself where available.  Lab Results  Component Value Date   WBC 7.5 01/13/2013   HGB 13.3 01/13/2013   HCT 40.0 01/13/2013   MCV 81.8 01/13/2013   PLT 196 01/13/2013      Component Value Date/Time   NA 137 01/13/2013 0856   NA 139 05/26/2012 0901   NA 145 01/10/2012 0859   K 4.2 01/13/2013 0856   K 4.4 05/26/2012 0901   K 4.5 01/10/2012 0859   CL 105 01/13/2013 0856   CL 106 05/26/2012 0901   CL 98 01/10/2012 0859   CO2 22 01/13/2013 0856   CO2 25 05/26/2012 0901   CO2 27 01/10/2012 0859   GLUCOSE 174*  01/13/2013 0856   GLUCOSE 115* 05/26/2012 0901   GLUCOSE 112 01/10/2012 0859   BUN 17.2 01/13/2013 0856   BUN 16 05/26/2012 0901   BUN 19 01/10/2012 0859   CREATININE 0.9 01/13/2013 0856   CREATININE 0.8 05/26/2012 0901   CREATININE 0.7 01/10/2012 0859   CALCIUM 9.5 01/13/2013 0856   CALCIUM 9.3 05/26/2012 0901   CALCIUM 9.1 01/10/2012 0859   PROT 6.7 01/13/2013 0856   PROT 6.6 05/26/2012 0901   PROT 6.6 01/10/2012 0859   ALBUMIN 3.6 01/13/2013 0856   ALBUMIN 3.8 05/26/2012 0901   AST 18 01/13/2013 0856   AST 18 05/26/2012 0901   AST 23 01/10/2012 0859   ALT 23 01/13/2013 0856   ALT 23 05/26/2012 0901   ALT 28 01/10/2012 0859   ALKPHOS 45 01/13/2013 0856   ALKPHOS 41 05/26/2012 0901   ALKPHOS 44 01/10/2012 0859   BILITOT 0.43 01/13/2013 0856   BILITOT 0.4 05/26/2012 0901   BILITOT 0.50 01/10/2012 0859   GFRNONAA >90 03/28/2012 0420   GFRAA >90 03/28/2012 0420   Lab Results  Component Value Date   CHOL 99 05/26/2012   HDL 62.00 05/26/2012   LDLCALC 28 05/26/2012   TRIG 46.0 05/26/2012   CHOLHDL 2 05/26/2012   Lab Results  Component Value Date   HGBA1C 7.1* 03/25/2012   No results found for this basename: [id number]   Lab Results  Component Value Date   TSH 1.91 05/26/2012      ASSESSMENT AND PLAN   71 years old Caucasian male, with past medical history of obesity, diabetes, and hypertension, hyperlipidemia, presenting with axonal peripheral neuropathy, he also reported a history of lumbar compression fracture, status post vertebroplasty, now denies significant low back pain, no shooting pain, no incontinence, there has length dependent sensory  changes, multiple morbid bilateral ankle dorsi flexion weakness,  1 he has evidence of axonal peripheral neuropathy, likely a lumbosacral radiculopathy as well, 2 his symptoms is well-controlled with Neurontin 600 mg 3 tablets every night, will refill his medications, 3 return to clinic in one year, if he has recurrent low back pain, shooting pain to his lower  extremity, may consider MRI of lumbar spine, 4 continue moderate exercise.     Marcial Pacas, M.D. Ph.D.  [facility] [address] [place], [place] [address] [phone]

## 2013-10-28 DIAGNOSIS — J441 Chronic obstructive pulmonary disease with (acute) exacerbation: Secondary | ICD-10-CM | POA: Diagnosis not present

## 2013-10-28 DIAGNOSIS — J45901 Unspecified asthma with (acute) exacerbation: Secondary | ICD-10-CM | POA: Diagnosis not present

## 2013-11-18 ENCOUNTER — Encounter: Payer: Self-pay | Admitting: Cardiology

## 2013-12-01 ENCOUNTER — Emergency Department (HOSPITAL_COMMUNITY): Payer: Medicare Other

## 2013-12-01 ENCOUNTER — Encounter (HOSPITAL_COMMUNITY): Payer: Self-pay | Admitting: Emergency Medicine

## 2013-12-01 ENCOUNTER — Emergency Department (HOSPITAL_COMMUNITY)
Admission: EM | Admit: 2013-12-01 | Discharge: 2013-12-01 | Disposition: A | Discharge status: Home / Self Care | Payer: Medicare Other | Attending: Emergency Medicine | Admitting: Emergency Medicine

## 2013-12-01 DIAGNOSIS — K219 Gastro-esophageal reflux disease without esophagitis: Secondary | ICD-10-CM | POA: Insufficient documentation

## 2013-12-01 DIAGNOSIS — I2510 Atherosclerotic heart disease of native coronary artery without angina pectoris: Secondary | ICD-10-CM | POA: Diagnosis not present

## 2013-12-01 DIAGNOSIS — E785 Hyperlipidemia, unspecified: Secondary | ICD-10-CM | POA: Diagnosis not present

## 2013-12-01 DIAGNOSIS — J4489 Other specified chronic obstructive pulmonary disease: Secondary | ICD-10-CM | POA: Insufficient documentation

## 2013-12-01 DIAGNOSIS — E119 Type 2 diabetes mellitus without complications: Secondary | ICD-10-CM | POA: Insufficient documentation

## 2013-12-01 DIAGNOSIS — I4891 Unspecified atrial fibrillation: Secondary | ICD-10-CM | POA: Diagnosis not present

## 2013-12-01 DIAGNOSIS — M545 Low back pain, unspecified: Secondary | ICD-10-CM | POA: Diagnosis not present

## 2013-12-01 DIAGNOSIS — F17200 Nicotine dependence, unspecified, uncomplicated: Secondary | ICD-10-CM | POA: Insufficient documentation

## 2013-12-01 DIAGNOSIS — Z8669 Personal history of other diseases of the nervous system and sense organs: Secondary | ICD-10-CM | POA: Diagnosis not present

## 2013-12-01 DIAGNOSIS — Z9861 Coronary angioplasty status: Secondary | ICD-10-CM | POA: Insufficient documentation

## 2013-12-01 DIAGNOSIS — S92919A Unspecified fracture of unspecified toe(s), initial encounter for closed fracture: Secondary | ICD-10-CM | POA: Diagnosis not present

## 2013-12-01 DIAGNOSIS — S92209A Fracture of unspecified tarsal bone(s) of unspecified foot, initial encounter for closed fracture: Secondary | ICD-10-CM | POA: Diagnosis not present

## 2013-12-01 DIAGNOSIS — Z7982 Long term (current) use of aspirin: Secondary | ICD-10-CM | POA: Diagnosis not present

## 2013-12-01 DIAGNOSIS — Y939 Activity, unspecified: Secondary | ICD-10-CM | POA: Insufficient documentation

## 2013-12-01 DIAGNOSIS — S92309A Fracture of unspecified metatarsal bone(s), unspecified foot, initial encounter for closed fracture: Secondary | ICD-10-CM | POA: Insufficient documentation

## 2013-12-01 DIAGNOSIS — Z8739 Personal history of other diseases of the musculoskeletal system and connective tissue: Secondary | ICD-10-CM | POA: Diagnosis not present

## 2013-12-01 DIAGNOSIS — Y929 Unspecified place or not applicable: Secondary | ICD-10-CM | POA: Insufficient documentation

## 2013-12-01 DIAGNOSIS — I10 Essential (primary) hypertension: Secondary | ICD-10-CM | POA: Diagnosis not present

## 2013-12-01 DIAGNOSIS — Z86711 Personal history of pulmonary embolism: Secondary | ICD-10-CM | POA: Insufficient documentation

## 2013-12-01 DIAGNOSIS — Z85118 Personal history of other malignant neoplasm of bronchus and lung: Secondary | ICD-10-CM | POA: Diagnosis not present

## 2013-12-01 DIAGNOSIS — Z79899 Other long term (current) drug therapy: Secondary | ICD-10-CM | POA: Diagnosis not present

## 2013-12-01 DIAGNOSIS — Z87442 Personal history of urinary calculi: Secondary | ICD-10-CM | POA: Insufficient documentation

## 2013-12-01 DIAGNOSIS — IMO0002 Reserved for concepts with insufficient information to code with codable children: Secondary | ICD-10-CM | POA: Insufficient documentation

## 2013-12-01 DIAGNOSIS — X500XXA Overexertion from strenuous movement or load, initial encounter: Secondary | ICD-10-CM | POA: Insufficient documentation

## 2013-12-01 DIAGNOSIS — J449 Chronic obstructive pulmonary disease, unspecified: Secondary | ICD-10-CM | POA: Insufficient documentation

## 2013-12-01 MED ORDER — HYDROCODONE-ACETAMINOPHEN 10-325 MG PO TABS: 1.0000 | ORAL_TABLET | Freq: Four times a day (QID) | ORAL | Status: DC | PRN

## 2013-12-01 NOTE — ED Notes (Signed)
Pt tripped injuring his left foot on coffee table leg; c/o left foot pain

## 2013-12-01 NOTE — Discharge Instructions (Signed)
Take norco as prescribed for pain. No driving until you see your orthopedic doctor.   Wear the boot at all times. Try not to bear weight on left leg.   Use your cane and crutches.   Follow up with your orthopedic doctor in a week for repeat xrays.   Return to ER if you have severe pain, numbness in toes.

## 2013-12-01 NOTE — ED Provider Notes (Signed)
CSN: [id number]     Arrival date & time 12/01/13  4098 History   First MD Initiated Contact with Patient 12/01/13 0813     Chief Complaint  Patient presents with  . Foot Injury   (Consider location/radiation/quality/duration/timing/severity/associated sxs/prior Treatment) The history is provided by the patient.  Paul [name] is a 73 y.o. male hx of DM, HTN, COPD, afib on aspirin here with fall. He was walking and his left foot got caught by the coffee table and he twisted his leg and fell. Denies any syncope or loss of consciousness or head injury. Denies any back pain or hip pain just pain in the left foot.    Past Medical History  Diagnosis Date  . Diabetes mellitus   . Hypertension   . COPD (chronic obstructive pulmonary disease)   . Asthma   . GERD (gastroesophageal reflux disease)   . Osteopenia   . lung ca dx'd 01/2006  . Pulmonary embolus   . Atrial fibrillation   . Hyperlipidemia   . CAD (coronary artery disease)     Prior PTCA  . Nephrolithiasis   . Tremor, essential    Past Surgical History  Procedure Laterality Date  . Lung cancer surgery  01/2006    Small excision of left lung tissue; mesh with radioactive seeds (per pt report)  . Cholecystectomy    . Tonsillectomy    . Angioplasty  Approx 1993  . Cardiac catheterization  Approximately 1993  . Fixation kyphoplasty     Family History  Problem Relation Age of Onset  . Heart disease Mother   . Tremor Mother   . Heart Problems Father   . Dementia Mother    History  Substance Use Topics  . Smoking status: Current Every Day Smoker -- 0.50 packs/day for 59 years    Types: Cigarettes  . Smokeless tobacco: Never Used  . Alcohol Use: 0.6 oz/week    1 Glasses of wine per week     Comment: Rare    Review of Systems  Musculoskeletal:       L foot pain   All other systems reviewed and are negative.    Allergies  Codeine and Phenobarbital  Home Medications   Current Outpatient Rx  Name  Route  Sig   Dispense  Refill  . acetaminophen (TYLENOL) 500 MG tablet   Oral   Take 1,000 mg by mouth every 6 (six) hours as needed for pain.          Marland Kitchen albuterol (PROVENTIL HFA;VENTOLIN HFA) 108 (90 BASE) MCG/ACT inhaler   Inhalation   Inhale 2 puffs into the lungs every 6 (six) hours as needed.           Marland Kitchen aspirin EC 81 MG tablet   Oral   Take 1 tablet (81 mg total) by mouth daily.   90 tablet   3   . budesonide-formoterol (SYMBICORT) 160-4.5 MCG/ACT inhaler      Take 2 puffs first thing in am and then another 2 puffs about 12 hours later.   3 Inhaler   0   . diltiazem (CARDIZEM CD) 240 MG 24 hr capsule   Oral   Take 1 capsule (240 mg total) by mouth daily.   90 capsule   0     .Marland KitchenPatient needs to contact office to schedule  App ...   . fexofenadine (ALLEGRA) 180 MG tablet   Oral   Take 180 mg by mouth daily.           Marland Kitchen  fluticasone (FLONASE) 50 MCG/ACT nasal spray   Nasal   Place 2 sprays into the nose daily.           Marland Kitchen gabapentin (NEURONTIN) 600 MG tablet   Oral   Take 1,800 mg by mouth at bedtime.         Marland Kitchen glimepiride (AMARYL) 4 MG tablet   Oral   Take 4 mg by mouth 2 (two) times daily.           Marland Kitchen guaiFENesin (MUCINEX) 600 MG 12 hr tablet   Oral   Take 1,200 mg by mouth daily.         . hydrochlorothiazide (HYDRODIURIL) 12.5 MG tablet   Oral   Take 12.5 mg by mouth daily.           Marland Kitchen HYDROcodone-acetaminophen (NORCO) 10-325 MG per tablet   Oral   Take 1 tablet by mouth every 6 (six) hours as needed.         Marland Kitchen losartan (COZAAR) 50 MG tablet   Oral   Take 1 tablet (50 mg total) by mouth daily.   90 tablet   0   . metFORMIN (GLUCOPHAGE) 1000 MG tablet   Oral   Take 1,000 mg by mouth 2 (two) times daily with a meal.           . metoCLOPramide (REGLAN) 10 MG tablet   Oral   Take 10 mg by mouth 2 (two) times daily with a meal.          . Multiple Vitamin (MULTIVITAMIN) tablet   Oral   Take 1 tablet by mouth daily. Centrum silver           . pioglitazone (ACTOS) 45 MG tablet   Oral   Take 45 mg by mouth daily.           . pravastatin (PRAVACHOL) 40 MG tablet   Oral   Take 1 tablet (40 mg total) by mouth every evening.   90 tablet   0     .Marland KitchenPatient needs to contact office to schedule  App ...    BP 147/64  Pulse 85  Temp(Src) 98.1 F (36.7 C) (Oral)  Resp 32  SpO2 92% Physical Exam  Nursing note and vitals reviewed. Constitutional: He is oriented to person, place, and time.  Chronically ill, slightly uncomfortable   HENT:  Head: Normocephalic.  Eyes: Conjunctivae are normal. Pupils are equal, round, and reactive to light.  Neck: Normal range of motion. Neck supple.  Cardiovascular: Normal rate.   Pulmonary/Chest: Effort normal.  Abdominal: Soft.  Musculoskeletal:  L foot slightly swollen, tender L 5th metatarsal area. No tibial or ankle tenderness. Abrasion on heel. 2+ pulses.   Neurological: He is alert and oriented to person, place, and time. No cranial nerve deficit.  Skin: Skin is warm and dry.  Psychiatric: He has a normal mood and affect. His behavior is normal. Judgment and thought content normal.    ED Course  Procedures (including critical care time) Labs Review Labs Reviewed - No data to display Imaging Review Dg Foot Complete Left  12/01/2013   CLINICAL DATA:  Pain post trauma  EXAM: LEFT FOOT - COMPLETE 3+ VIEW  COMPARISON:  None.  FINDINGS: Frontal, oblique, and lateral views were obtained. There are fractures of the distal third and fifth metatarsals in essentially anatomic alignment. There is a fracture of the proximal aspect of the fourth proximal phalanx in near anatomic alignment. There is no appreciable dislocation. No  other fractures are identified. There is osteoarthritic change in all PIP and DIP joints. Bones are osteoporotic. No erosive change.  IMPRESSION: Fractures of the distal third and fifth metatarsals and the fourth proximal phalanx in near anatomic alignment. Bones  are osteoporotic. Multilevel osteoarthritic change.   Electronically Signed   By: Lowella Grip M.D.   On: 12/01/2013 08:36    EKG Interpretation   None       MDM  No diagnosis found. RYATT CORSINO is a 73 y.o. male here with mechanical fall. Will get L foot xray. He took norco at home already.   9:02 AM Xray showed fractures of distal 3rd, 4th, and 5th metatarsals with osteoporosis. I wanted him to get posterior splint but he prefers a boot. He also has crutches and cane at home. Recommend non weight bearing. He will f/u with his orthopedic doctor at [facility].      Wandra Arthurs, MD 12/01/13 [phone]

## 2013-12-17 DIAGNOSIS — Z85118 Personal history of other malignant neoplasm of bronchus and lung: Secondary | ICD-10-CM | POA: Diagnosis not present

## 2013-12-17 DIAGNOSIS — J449 Chronic obstructive pulmonary disease, unspecified: Secondary | ICD-10-CM | POA: Diagnosis not present

## 2013-12-17 DIAGNOSIS — Z0000 Encounter for general adult medical examination without abnormal findings: Secondary | ICD-10-CM | POA: Diagnosis not present

## 2013-12-17 DIAGNOSIS — F17200 Nicotine dependence, unspecified, uncomplicated: Secondary | ICD-10-CM | POA: Diagnosis not present

## 2013-12-17 DIAGNOSIS — E7800 Pure hypercholesterolemia, unspecified: Secondary | ICD-10-CM | POA: Diagnosis not present

## 2013-12-17 DIAGNOSIS — I10 Essential (primary) hypertension: Secondary | ICD-10-CM | POA: Diagnosis not present

## 2013-12-17 DIAGNOSIS — IMO0001 Reserved for inherently not codable concepts without codable children: Secondary | ICD-10-CM | POA: Diagnosis not present

## 2013-12-17 DIAGNOSIS — M810 Age-related osteoporosis without current pathological fracture: Secondary | ICD-10-CM | POA: Diagnosis not present

## 2013-12-17 DIAGNOSIS — Z23 Encounter for immunization: Secondary | ICD-10-CM | POA: Diagnosis not present

## 2013-12-25 ENCOUNTER — Telehealth: Payer: Self-pay | Admitting: Internal Medicine

## 2013-12-25 NOTE — Telephone Encounter (Signed)
lvm for pt regarding to 3.5.15 appt moved to 3.9.15 per MD request on [facility]

## 2013-12-30 DIAGNOSIS — S92309A Fracture of unspecified metatarsal bone(s), unspecified foot, initial encounter for closed fracture: Secondary | ICD-10-CM | POA: Diagnosis not present

## 2013-12-31 DIAGNOSIS — M810 Age-related osteoporosis without current pathological fracture: Secondary | ICD-10-CM | POA: Diagnosis not present

## 2014-01-12 ENCOUNTER — Ambulatory Visit: Payer: Medicare Other

## 2014-01-13 ENCOUNTER — Encounter (HOSPITAL_COMMUNITY): Payer: Self-pay

## 2014-01-13 ENCOUNTER — Ambulatory Visit (HOSPITAL_COMMUNITY)
Admission: RE | Admit: 2014-01-13 | Discharge: 2014-01-13 | Disposition: A | Discharge status: Home / Self Care | Payer: Medicare Other | Source: Ambulatory Visit | Attending: Internal Medicine | Admitting: Internal Medicine

## 2014-01-13 ENCOUNTER — Other Ambulatory Visit (HOSPITAL_BASED_OUTPATIENT_CLINIC_OR_DEPARTMENT_OTHER): Payer: Medicare Other

## 2014-01-13 DIAGNOSIS — R059 Cough, unspecified: Secondary | ICD-10-CM | POA: Insufficient documentation

## 2014-01-13 DIAGNOSIS — J438 Other emphysema: Secondary | ICD-10-CM | POA: Diagnosis not present

## 2014-01-13 DIAGNOSIS — R0602 Shortness of breath: Secondary | ICD-10-CM | POA: Insufficient documentation

## 2014-01-13 DIAGNOSIS — R05 Cough: Secondary | ICD-10-CM | POA: Insufficient documentation

## 2014-01-13 DIAGNOSIS — C3490 Malignant neoplasm of unspecified part of unspecified bronchus or lung: Secondary | ICD-10-CM | POA: Diagnosis not present

## 2014-01-13 DIAGNOSIS — C3430 Malignant neoplasm of lower lobe, unspecified bronchus or lung: Secondary | ICD-10-CM

## 2014-01-13 LAB — CBC WITH DIFFERENTIAL/PLATELET
BASO%: 0.7 % (ref 0.0–2.0)
Basophils Absolute: 0.1 10*3/uL (ref 0.0–0.1)
EOS%: 2.4 % (ref 0.0–7.0)
Eosinophils Absolute: 0.2 10*3/uL (ref 0.0–0.5)
HCT: 42.8 % (ref 38.4–49.9)
HGB: 14.1 g/dL (ref 13.0–17.1)
LYMPH%: 16.7 % (ref 14.0–49.0)
MCH: 27.6 pg (ref 27.2–33.4)
MCHC: 33 g/dL (ref 32.0–36.0)
MCV: 83.9 fL (ref 79.3–98.0)
MONO#: 0.8 10*3/uL (ref 0.1–0.9)
MONO%: 9.1 % (ref 0.0–14.0)
NEUT#: 6.3 10*3/uL (ref 1.5–6.5)
NEUT%: 71.1 % (ref 39.0–75.0)
Platelets: 242 10*3/uL (ref 140–400)
RBC: 5.1 10*6/uL (ref 4.20–5.82)
RDW: 15.3 % — ABNORMAL HIGH (ref 11.0–14.6)
WBC: 8.8 10*3/uL (ref 4.0–10.3)
lymph#: 1.5 10*3/uL (ref 0.9–3.3)

## 2014-01-13 LAB — COMPREHENSIVE METABOLIC PANEL (CC13)
ALT: 25 U/L (ref 0–55)
AST: 21 U/L (ref 5–34)
Albumin: 3.7 g/dL (ref 3.5–5.0)
Alkaline Phosphatase: 53 U/L (ref 40–150)
Anion Gap: 9 mEq/L (ref 3–11)
BUN: 20.1 mg/dL (ref 7.0–26.0)
CO2: 26 mEq/L (ref 22–29)
Calcium: 9.9 mg/dL (ref 8.4–10.4)
Chloride: 105 mEq/L (ref 98–109)
Creatinine: 0.9 mg/dL (ref 0.7–1.3)
Glucose: 112 mg/dl (ref 70–140)
Potassium: 4.6 mEq/L (ref 3.5–5.1)
Sodium: 140 mEq/L (ref 136–145)
Total Bilirubin: 0.39 mg/dL (ref 0.20–1.20)
Total Protein: 6.7 g/dL (ref 6.4–8.3)

## 2014-01-14 ENCOUNTER — Ambulatory Visit: Payer: Medicare Other | Admitting: Internal Medicine

## 2014-01-18 ENCOUNTER — Ambulatory Visit (HOSPITAL_BASED_OUTPATIENT_CLINIC_OR_DEPARTMENT_OTHER): Payer: Medicare Other | Admitting: Internal Medicine

## 2014-01-18 ENCOUNTER — Encounter: Payer: Self-pay | Admitting: Internal Medicine

## 2014-01-18 VITALS — BP 141/77 | HR 77 | Temp 97.0°F | Resp 20 | Ht 69.0 in | Wt 260.7 lb

## 2014-01-18 DIAGNOSIS — C3490 Malignant neoplasm of unspecified part of unspecified bronchus or lung: Secondary | ICD-10-CM

## 2014-01-18 DIAGNOSIS — C3430 Malignant neoplasm of lower lobe, unspecified bronchus or lung: Secondary | ICD-10-CM

## 2014-01-18 NOTE — Patient Instructions (Signed)
Smoking Cessation Quitting smoking is important to your health and has many advantages. However, it is not always easy to quit since nicotine is a very addictive drug. Often times, people try 3 times or more before being able to quit. This document explains the best ways for you to prepare to quit smoking. Quitting takes hard work and a lot of effort, but you can do it. ADVANTAGES OF QUITTING SMOKING  You will live longer, feel better, and live better.  Your body will feel the impact of quitting smoking almost immediately.  Within 20 minutes, blood pressure decreases. Your pulse returns to its normal level.  After 8 hours, carbon monoxide levels in the blood return to normal. Your oxygen level increases.  After 24 hours, the chance of having a heart attack starts to decrease. Your breath, hair, and body stop smelling like smoke.  After 48 hours, damaged nerve endings begin to recover. Your sense of taste and smell improve.  After 72 hours, the body is virtually free of nicotine. Your bronchial tubes relax and breathing becomes easier.  After 2 to 12 weeks, lungs can hold more air. Exercise becomes easier and circulation improves.  The risk of having a heart attack, stroke, cancer, or lung disease is greatly reduced.  After 1 year, the risk of coronary heart disease is cut in half.  After 5 years, the risk of stroke falls to the same as a nonsmoker.  After 10 years, the risk of lung cancer is cut in half and the risk of other cancers decreases significantly.  After 15 years, the risk of coronary heart disease drops, usually to the level of a nonsmoker.  If you are pregnant, quitting smoking will improve your chances of having a healthy baby.  The people you live with, especially any children, will be healthier.  You will have extra money to spend on things other than cigarettes. QUESTIONS TO THINK ABOUT BEFORE ATTEMPTING TO QUIT You may want to talk about your answers with your  caregiver.  Why do you want to quit?  If you tried to quit in the past, what helped and what did not?  What will be the most difficult situations for you after you quit? How will you plan to handle them?  Who can help you through the tough times? Your family? Friends? A caregiver?  What pleasures do you get from smoking? What ways can you still get pleasure if you quit? Here are some questions to ask your caregiver:  How can you help me to be successful at quitting?  What medicine do you think would be best for me and how should I take it?  What should I do if I need more help?  What is smoking withdrawal like? How can I get information on withdrawal? GET READY  Set a quit date.  Change your environment by getting rid of all cigarettes, ashtrays, matches, and lighters in your home, car, or work. Do not let people smoke in your home.  Review your past attempts to quit. Think about what worked and what did not. GET SUPPORT AND ENCOURAGEMENT You have a better chance of being successful if you have help. You can get support in many ways.  Tell your family, friends, and co-workers that you are going to quit and need their support. Ask them not to smoke around you.  Get individual, group, or telephone counseling and support. Programs are available at [employer] and health centers. Call your local health department for  information about programs in your area.  Spiritual beliefs and practices may help some smokers quit.  Download a "quit meter" on your computer to keep track of quit statistics, such as how long you have gone without smoking, cigarettes not smoked, and money saved.  Get a self-help book about quitting smoking and staying off of tobacco. Owen yourself from urges to smoke. Talk to someone, go for a walk, or occupy your time with a task.  Change your normal routine. Take a different route to work. Drink tea instead of coffee.  Eat breakfast in a different place.  Reduce your stress. Take a hot bath, exercise, or read a book.  Plan something enjoyable to do every day. Reward yourself for not smoking.  Explore interactive web-based programs that specialize in helping you quit. GET MEDICINE AND USE IT CORRECTLY Medicines can help you stop smoking and decrease the urge to smoke. Combining medicine with the above behavioral methods and support can greatly increase your chances of successfully quitting smoking.  Nicotine replacement therapy helps deliver nicotine to your body without the negative effects and risks of smoking. Nicotine replacement therapy includes nicotine gum, lozenges, inhalers, nasal sprays, and skin patches. Some may be available over-the-counter and others require a prescription.  Antidepressant medicine helps people abstain from smoking, but how this works is unknown. This medicine is available by prescription.  Nicotinic receptor partial agonist medicine simulates the effect of nicotine in your brain. This medicine is available by prescription. Ask your caregiver for advice about which medicines to use and how to use them based on your health history. Your caregiver will tell you what side effects to look out for if you choose to be on a medicine or therapy. Carefully read the information on the package. Do not use any other product containing nicotine while using a nicotine replacement product.  RELAPSE OR DIFFICULT SITUATIONS Most relapses occur within the first 3 months after quitting. Do not be discouraged if you start smoking again. Remember, most people try several times before finally quitting. You may have symptoms of withdrawal because your body is used to nicotine. You may crave cigarettes, be irritable, feel very hungry, cough often, get headaches, or have difficulty concentrating. The withdrawal symptoms are only temporary. They are strongest when you first quit, but they will go away within  10 14 days. To reduce the chances of relapse, try to:  Avoid drinking alcohol. Drinking lowers your chances of successfully quitting.  Reduce the amount of caffeine you consume. Once you quit smoking, the amount of caffeine in your body increases and can give you symptoms, such as a rapid heartbeat, sweating, and anxiety.  Avoid smokers because they can make you want to smoke.  Do not let weight gain distract you. Many smokers will gain weight when they quit, usually less than 10 pounds. Eat a healthy diet and stay active. You can always lose the weight gained after you quit.  Find ways to improve your mood other than smoking. FOR MORE INFORMATION  www.smokefree.gov  Document Released: 10/23/2001 Document Revised: 04/29/2012 Document Reviewed: 02/07/2012 [facility] Patient Information 2014 [place], [place].

## 2014-01-18 NOTE — Progress Notes (Signed)
[facility] [place] [phone]   [phone]  OFFICE PROGRESS NOTE  Marjorie Smolder, MD [address] [place] [address]  DIAGNOSIS: Stage IA (T1, N0, MX) non-small cell lung cancer, adenocarcinoma with bronchoalveolar features diagnosed in 01/2006.   PRIOR THERAPY: Status post wedge resection of the left lower lobe with node sampling and seed implants under the care of Dr. Arlyce Dice and Dr. Tammi Klippel on 02/05/2006.   CURRENT THERAPY: Observation.   INTERVAL HISTORY: Paul [name] 73 y.o. male returns to the clinic today for routine annual followup visit. The patient is feeling fine today with no specific complaints except for mild shortness breath with exertion secondary to COPD. He broke his foot several weeks ago and was seen by orthopedic surgery. The patient denied having any significant chest pain, cough or hemoptysis. He denied having any significant weight loss or night sweats. He had repeat CT scan of the chest performed recently and he is here today for evaluation and discussion of his scan results.  MEDICAL HISTORY: Past Medical History  Diagnosis Date  . Diabetes mellitus   . Hypertension   . COPD (chronic obstructive pulmonary disease)   . Asthma   . GERD (gastroesophageal reflux disease)   . Osteopenia   . Pulmonary embolus   . Atrial fibrillation   . Hyperlipidemia   . CAD (coronary artery disease)     Prior PTCA  . Nephrolithiasis   . Tremor, essential   . lung ca dx'd 01/2006    ALLERGIES:  is allergic to codeine and phenobarbital.  MEDICATIONS:  Current Outpatient Prescriptions  Medication Sig Dispense Refill  . acetaminophen (TYLENOL) 500 MG tablet Take 1,000 mg by mouth every 6 (six) hours as needed for pain.       Marland Kitchen albuterol (PROVENTIL HFA;VENTOLIN HFA) 108 (90 BASE) MCG/ACT inhaler Inhale 2 puffs into the lungs every 6 (six) hours as needed.        Marland Kitchen alendronate (FOSAMAX) 70 MG tablet Take 70 mg by  mouth once a week. Take with a full glass of water on an empty stomach.      Marland Kitchen aspirin EC 81 MG tablet Take 1 tablet (81 mg total) by mouth daily.  90 tablet  3  . budesonide-formoterol (SYMBICORT) 160-4.5 MCG/ACT inhaler Take 2 puffs first thing in am and then another 2 puffs about 12 hours later.  3 Inhaler  0  . diltiazem (CARDIZEM CD) 240 MG 24 hr capsule Take 1 capsule (240 mg total) by mouth daily.  90 capsule  0  . fexofenadine (ALLEGRA) 180 MG tablet Take 180 mg by mouth daily.        . fluticasone (FLONASE) 50 MCG/ACT nasal spray Place 2 sprays into the nose daily.        Marland Kitchen gabapentin (NEURONTIN) 600 MG tablet Take 1,800 mg by mouth at bedtime.      Marland Kitchen glimepiride (AMARYL) 4 MG tablet Take 4 mg by mouth 2 (two) times daily.        Marland Kitchen guaiFENesin (MUCINEX) 600 MG 12 hr tablet Take 1,200 mg by mouth daily.      . hydrochlorothiazide (HYDRODIURIL) 12.5 MG tablet Take 12.5 mg by mouth daily.        Marland Kitchen HYDROcodone-acetaminophen (NORCO) 10-325 MG per tablet Take 1 tablet by mouth every 6 (six) hours as needed.  15 tablet  0  . losartan (COZAAR) 50 MG tablet Take 1 tablet (50 mg total) by mouth  daily.  90 tablet  0  . metFORMIN (GLUCOPHAGE) 1000 MG tablet Take 1,000 mg by mouth 2 (two) times daily with a meal.        . metoCLOPramide (REGLAN) 10 MG tablet Take 10 mg by mouth 2 (two) times daily with a meal.       . Multiple Vitamin (MULTIVITAMIN) tablet Take 1 tablet by mouth daily. Centrum silver       . pioglitazone (ACTOS) 45 MG tablet Take 45 mg by mouth daily.        . Vitamin D, Ergocalciferol, (DRISDOL) 50000 UNITS CAPS capsule Take 50,000 Units by mouth every 7 (seven) days.      . pravastatin (PRAVACHOL) 40 MG tablet Take 1 tablet (40 mg total) by mouth every evening.  90 tablet  0   No current facility-administered medications for this visit.    SURGICAL HISTORY:  Past Surgical History  Procedure Laterality Date  . Lung cancer surgery  01/2006    Small excision of left lung tissue;  mesh with radioactive seeds (per pt report)  . Cholecystectomy    . Tonsillectomy    . Angioplasty  Approx 1993  . Cardiac catheterization  Approximately 1993  . Fixation kyphoplasty      REVIEW OF SYSTEMS:  A comprehensive review of systems was negative except for: Respiratory: positive for dyspnea on exertion   PHYSICAL EXAMINATION: General appearance: alert, cooperative and no distress Head: Normocephalic, without obvious abnormality, atraumatic Lymph nodes: Cervical, supraclavicular, and axillary nodes normal. Resp: clear to auscultation bilaterally Cardio: regular rate and rhythm, S1, S2 normal, no murmur, click, rub or gallop GI: soft, non-tender; bowel sounds normal; no masses,  no organomegaly Extremities: extremities normal, atraumatic, no cyanosis or edema  ECOG PERFORMANCE STATUS: 1 - Symptomatic but completely ambulatory  Blood pressure 141/77, pulse 77, temperature 97 F (36.1 C), temperature source Oral, resp. rate 20, height _0  (1.753 m), weight 260 lb 11.2 oz (118.253 kg), SpO2 98.00%.  LABORATORY DATA: Lab Results  Component Value Date   WBC 8.8 01/13/2014   HGB 14.1 01/13/2014   HCT 42.8 01/13/2014   MCV 83.9 01/13/2014   PLT 242 01/13/2014      Chemistry      Component Value Date/Time   NA 140 01/13/2014 0922   NA 139 05/26/2012 0901   NA 145 01/10/2012 0859   K 4.6 01/13/2014 0922   K 4.4 05/26/2012 0901   K 4.5 01/10/2012 0859   CL 105 01/13/2013 0856   CL 106 05/26/2012 0901   CL 98 01/10/2012 0859   CO2 26 01/13/2014 0922   CO2 25 05/26/2012 0901   CO2 27 01/10/2012 0859   BUN 20.1 01/13/2014 0922   BUN 16 05/26/2012 0901   BUN 19 01/10/2012 0859   CREATININE 0.9 01/13/2014 0922   CREATININE 0.8 05/26/2012 0901   CREATININE 0.7 01/10/2012 0859      Component Value Date/Time   CALCIUM 9.9 01/13/2014 0922   CALCIUM 9.3 05/26/2012 0901   CALCIUM 9.1 01/10/2012 0859   ALKPHOS 53 01/13/2014 0922   ALKPHOS 41 05/26/2012 0901   ALKPHOS 44 01/10/2012 0859   AST 21 01/13/2014 0922     AST 18 05/26/2012 0901   AST 23 01/10/2012 0859   ALT 25 01/13/2014 0922   ALT 23 05/26/2012 0901   ALT 28 01/10/2012 0859   BILITOT 0.39 01/13/2014 0922   BILITOT 0.4 05/26/2012 0901   BILITOT 0.50 01/10/2012 0859  RADIOGRAPHIC STUDIES:  Ct Chest Wo Contrast  01/13/2014   CLINICAL DATA:  Lung cancer. Seed implant. Productive cough with shortness of breath.  EXAM: CT CHEST WITHOUT CONTRAST  TECHNIQUE: Multidetector CT imaging of the chest was performed following the standard protocol without IV contrast.  COMPARISON:  01/13/2013  FINDINGS: There is no axillary lymphadenopathy. The scattered small lymph nodes in the mediastinum are stable. No evidence for mediastinal lymphadenopathy. No hilar lymphadenopathy is evident. Heart size is normal. Coronary artery calcification is noted. No pericardial or pleural effusion.  Lung windows demonstrate emphysema. No pulmonary parenchymal nodule or mass. No evidence for pulmonary edema. Subtle lingular scarring is unchanged. Radioactive seed implant identified in the posterior right lower lobe and area of surgical change. This is stable in the interval. No new or progressive soft tissue in the region of previous treatment.  Images which include the upper abdomen again show a 4.9 x 6.0 cm fatty right adrenal lesion, most consistent with myelolipoma. There is probably a tiny 13 mm myelolipoma in the left adrenal gland, also unchanged.  Bone windows reveal no worrisome lytic or sclerotic osseous lesions.  IMPRESSION: 1. Stable exam. Evidence of prior wedge resection and brachytherapy seed placement in the posterior left lower lobe. No new or progressive soft tissue in the region of prior treatment to suggest local recurrence. No evidence for metastatic disease in the chest. 2. Emphysema. 3. 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   Electronically Signed   By: Misty Stanley M.D.   On: 01/13/2014 11:06   ASSESSMENT AND PLAN:  This is a very pleasant 73 years old white male with history of stage IA non-small cell lung cancer status post wedge resection of the left lower lobe with node sampling and seed implants. He has been observation since March of 2007 with no evidence for disease recurrence. I discussed the scan results with the patient today.  I given him the option of continuous observation with me with annual chest x-ray versus doing the x-ray with his primary care physician during his wellness exam. The patient would like to do the chest x-ray with his primary care physician at this point. I would be happy to see him as needed basis.  All questions were answered. The patient knows to call the clinic with any problems, questions or concerns. We can certainly see the patient much sooner if necessary.  Disclaimer: This note was dictated with voice recognition software. Similar sounding words can inadvertently be transcribed and may not be corrected upon review.

## 2014-01-29 DIAGNOSIS — S92309A Fracture of unspecified metatarsal bone(s), unspecified foot, initial encounter for closed fracture: Secondary | ICD-10-CM | POA: Diagnosis not present

## 2014-02-19 ENCOUNTER — Other Ambulatory Visit: Payer: Self-pay | Admitting: Cardiology

## 2014-02-19 ENCOUNTER — Other Ambulatory Visit: Payer: Self-pay | Admitting: Internal Medicine

## 2014-03-27 ENCOUNTER — Other Ambulatory Visit: Payer: Self-pay | Admitting: Internal Medicine

## 2014-04-03 ENCOUNTER — Other Ambulatory Visit: Payer: Self-pay | Admitting: Internal Medicine

## 2014-04-07 ENCOUNTER — Other Ambulatory Visit: Payer: Self-pay | Admitting: Internal Medicine

## 2014-04-08 DIAGNOSIS — E7800 Pure hypercholesterolemia, unspecified: Secondary | ICD-10-CM | POA: Diagnosis not present

## 2014-04-08 DIAGNOSIS — I10 Essential (primary) hypertension: Secondary | ICD-10-CM | POA: Diagnosis not present

## 2014-04-08 DIAGNOSIS — IMO0001 Reserved for inherently not codable concepts without codable children: Secondary | ICD-10-CM | POA: Diagnosis not present

## 2014-04-08 DIAGNOSIS — Z6841 Body Mass Index (BMI) 40.0 and over, adult: Secondary | ICD-10-CM | POA: Diagnosis not present

## 2014-04-08 DIAGNOSIS — J449 Chronic obstructive pulmonary disease, unspecified: Secondary | ICD-10-CM | POA: Diagnosis not present

## 2014-04-08 DIAGNOSIS — F17200 Nicotine dependence, unspecified, uncomplicated: Secondary | ICD-10-CM | POA: Diagnosis not present

## 2014-04-09 ENCOUNTER — Other Ambulatory Visit (HOSPITAL_COMMUNITY): Payer: Self-pay

## 2014-04-09 DIAGNOSIS — J441 Chronic obstructive pulmonary disease with (acute) exacerbation: Secondary | ICD-10-CM

## 2014-04-16 ENCOUNTER — Ambulatory Visit (HOSPITAL_COMMUNITY)
Admission: RE | Admit: 2014-04-16 | Discharge: 2014-04-16 | Disposition: A | Discharge status: Home / Self Care | Payer: Medicare Other | Source: Ambulatory Visit | Attending: Family Medicine | Admitting: Family Medicine

## 2014-05-19 ENCOUNTER — Other Ambulatory Visit: Payer: Self-pay | Admitting: Cardiology

## 2014-05-25 ENCOUNTER — Ambulatory Visit (INDEPENDENT_AMBULATORY_CARE_PROVIDER_SITE_OTHER): Payer: Medicare Other

## 2014-05-25 DIAGNOSIS — E1149 Type 2 diabetes mellitus with other diabetic neurological complication: Secondary | ICD-10-CM | POA: Diagnosis not present

## 2014-05-25 DIAGNOSIS — L608 Other nail disorders: Secondary | ICD-10-CM

## 2014-05-25 DIAGNOSIS — E1140 Type 2 diabetes mellitus with diabetic neuropathy, unspecified: Secondary | ICD-10-CM

## 2014-05-25 DIAGNOSIS — Q828 Other specified congenital malformations of skin: Secondary | ICD-10-CM

## 2014-05-25 DIAGNOSIS — E11610 Type 2 diabetes mellitus with diabetic neuropathic arthropathy: Secondary | ICD-10-CM

## 2014-05-25 DIAGNOSIS — M19079 Primary osteoarthritis, unspecified ankle and foot: Secondary | ICD-10-CM

## 2014-05-25 DIAGNOSIS — E1142 Type 2 diabetes mellitus with diabetic polyneuropathy: Secondary | ICD-10-CM | POA: Diagnosis not present

## 2014-05-25 NOTE — Patient Instructions (Signed)
Diabetes and Foot Care Diabetes may cause you to have problems because of poor blood supply (circulation) to your feet and legs. This may cause the skin on your feet to become thinner, break easier, and heal more slowly. Your skin may become dry, and the skin may peel and crack. You may also have nerve damage in your legs and feet causing decreased feeling in them. You may not notice minor injuries to your feet that could lead to infections or more serious problems. Taking care of your feet is one of the most important things you can do for yourself.  HOME CARE INSTRUCTIONS  Wear shoes at all times, even in the house. Do not go barefoot. Bare feet are easily injured.  Check your feet daily for blisters, cuts, and redness. If you cannot see the bottom of your feet, use a mirror or ask someone for help.  Wash your feet with warm water (do not use hot water) and mild soap. Then pat your feet and the areas between your toes until they are completely dry. Do not soak your feet as this can dry your skin.  Apply a moisturizing lotion or petroleum jelly (that does not contain alcohol and is unscented) to the skin on your feet and to dry, brittle toenails. Do not apply lotion between your toes.  Trim your toenails straight across. Do not dig under them or around the cuticle. File the edges of your nails with an emery board or nail file.  Do not cut corns or calluses or try to remove them with medicine.  Wear clean socks or stockings every day. Make sure they are not too tight. Do not wear knee-high stockings since they may decrease blood flow to your legs.  Wear shoes that fit properly and have enough cushioning. To break in new shoes, wear them for just a few hours a day. This prevents you from injuring your feet. Always look in your shoes before you put them on to be sure there are no objects inside.  Do not cross your legs. This may decrease the blood flow to your feet.  If you find a minor scrape,  cut, or break in the skin on your feet, keep it and the skin around it clean and dry. These areas may be cleansed with mild soap and water. Do not cleanse the area with peroxide, alcohol, or iodine.  When you remove an adhesive bandage, be sure not to damage the skin around it.  If you have a wound, look at it several times a day to make sure it is healing.  Do not use heating pads or hot water bottles. They may burn your skin. If you have lost feeling in your feet or legs, you may not know it is happening until it is too late.  Make sure your health care provider performs a complete foot exam at least annually or more often if you have foot problems. Report any cuts, sores, or bruises to your health care provider immediately. SEEK MEDICAL CARE IF:   You have an injury that is not healing.  You have cuts or breaks in the skin.  You have an ingrown nail.  You notice redness on your legs or feet.  You feel burning or tingling in your legs or feet.  You have pain or cramps in your legs and feet.  Your legs or feet are numb.  Your feet always feel cold. SEEK IMMEDIATE MEDICAL CARE IF:   There is increasing redness,   swelling, or pain in or around a wound.  There is a red line that goes up your leg.  Pus is coming from a wound.  You develop a fever or as directed by your health care provider.  You notice a bad smell coming from an ulcer or wound. Document Released: 10/26/2000 Document Revised: 07/01/2013 Document Reviewed: 04/07/2013 ExitCare Patient Information 2015 [employer]. This information is not intended to replace advice given to you by your health care provider. Make sure you discuss any questions you have with your health care provider.  

## 2014-05-25 NOTE — Progress Notes (Signed)
   Subjective:    Patient ID: Paul [name], male    DOB: [birth year], 73 y.o.   MRN: [id number]  HPI patient presents this time for diabetic foot nail care and requesting new diabetic shoes. Issues that were issued sometime within the last year posse pressure October of this past year hent she worked really well however part of the shoe wear or leather failed one seems on the back of the shoe we'll try to new parachuters form and posse a replacement of his old pair as well.    Review of Systems no new systemic changes or findings noted at this     Objective:   Physical Exam Neurovascular status is intact with pedal pulses palpable DP postal for bilateral PT one over 4 bilateral capillary refill time 3 seconds all digits skin temperature is warm turgor normal no edema rubor pallor mild varicosities noted. Neurologically epicritic sensations diminished on Semmes Weinstein to the forefoot and digits. Dermatologically skin color pigment normal hair growth absent nails thick brittle crumbly friable 1 through 5 bilateral with discoloration and tenderness. Following debridement the hallux right history with lumicain and Neosporin. Patient has significant Charcot-type changes of the left foot with prominence of talonavicular joint collapse and medial, medial arch. Would benefit from new diabetic extra-depth shoes molded inlays he's been wearing diabetic shoes for several years needs replacement this time is shoes are worn breaking.       Assessment & Plan:  Assessment diabetes with history peripheral neuropathy dystrophic friable gratified nails 1 through 5 bilateral debridement at this time Neosporin applied to the right hallux following treated with lumicain. At this time also get authorization for diabetic extra-depth shoes and custom molded inlays we'll contact patient with the next month once shoes but authorized by primary physician otherwise 3 month followup for continued palliative nail care. Will  also try to get a replacement of one of the broken shoes his right diabetic shoes broken down and apex you have failure and leather . Recheck within the next month with orthotics are ready for measurements and diabetic shoe measurement  Harriet Masson DPM

## 2014-05-31 ENCOUNTER — Telehealth: Payer: Self-pay

## 2014-05-31 MED ORDER — PRAVASTATIN SODIUM 40 MG PO TABS: 40.0000 mg | ORAL_TABLET | Freq: Every day | ORAL | Status: DC

## 2014-05-31 MED ORDER — DILTIAZEM HCL ER COATED BEADS 240 MG PO CP24: 240.0000 mg | ORAL_CAPSULE | Freq: Every day | ORAL | Status: DC

## 2014-05-31 NOTE — Telephone Encounter (Signed)
Pt called in stating he only got refill for 30 day supply sent into pharmacy [facility]) and he wants 90 day supply for his medications. Told pt Dr. Stanford Breed nurse will have to approve refill since it has been over a year with no o/v.       Disp Refills Start End      CARTIA XT 240 MG 24 hr capsule 30 capsule 0 05/19/2014        Sig:  TAKE ONE CAPSULE BY MOUTH DAILY*NEEDS APPOINTMENT FOR FURTHER REFILLS*      Class:  Normal      DAW:  No      Comment:  **PATIENT MUST HAVE APPOINTMENT FOR FURTHER REFILLS**      Authorizing Provider:  Lelon Perla, MD      Ordering User:  Juventino Slovak, CMA      pravastatin (PRAVACHOL) 40 MG tablet 30 tablet 0 05/19/2014        Sig:  TAKE 1 TABLET BY MOUTH EVERY EVENING*NEEDS APPOINTMENT FOR FURTHER REFILLS*      Class:  Normal      DAW:  No      Comment:  **PATIENT MUST HAVE APPOINTMENT FOR FURTHER REFILLS**      Authorizing Provider:  Lelon Perla, MD      Ordering User:  Juventino Slovak, CMA

## 2014-05-31 NOTE — Telephone Encounter (Signed)
Scripts sent to the pharm for 90 days. It will take that long to get the pt in to be seen. He will require a follow up visit.

## 2014-06-01 DIAGNOSIS — E119 Type 2 diabetes mellitus without complications: Secondary | ICD-10-CM | POA: Diagnosis not present

## 2014-06-10 DIAGNOSIS — J449 Chronic obstructive pulmonary disease, unspecified: Secondary | ICD-10-CM | POA: Diagnosis not present

## 2014-06-10 DIAGNOSIS — J40 Bronchitis, not specified as acute or chronic: Secondary | ICD-10-CM | POA: Diagnosis not present

## 2014-06-15 ENCOUNTER — Ambulatory Visit: Payer: Medicare Other | Admitting: Cardiology

## 2014-06-21 ENCOUNTER — Ambulatory Visit (INDEPENDENT_AMBULATORY_CARE_PROVIDER_SITE_OTHER)
Admission: RE | Admit: 2014-06-21 | Discharge: 2014-06-21 | Disposition: A | Discharge status: Home / Self Care | Payer: Medicare Other | Source: Ambulatory Visit | Attending: Internal Medicine | Admitting: Internal Medicine

## 2014-06-21 ENCOUNTER — Encounter: Payer: Self-pay | Admitting: Internal Medicine

## 2014-06-21 ENCOUNTER — Ambulatory Visit (INDEPENDENT_AMBULATORY_CARE_PROVIDER_SITE_OTHER): Payer: Medicare Other | Admitting: Internal Medicine

## 2014-06-21 VITALS — BP 104/60 | HR 97 | Temp 97.9°F | Ht 69.0 in | Wt 260.0 lb

## 2014-06-21 DIAGNOSIS — R059 Cough, unspecified: Secondary | ICD-10-CM | POA: Diagnosis not present

## 2014-06-21 DIAGNOSIS — R05 Cough: Secondary | ICD-10-CM

## 2014-06-21 DIAGNOSIS — J449 Chronic obstructive pulmonary disease, unspecified: Secondary | ICD-10-CM

## 2014-06-21 DIAGNOSIS — R0902 Hypoxemia: Secondary | ICD-10-CM | POA: Diagnosis not present

## 2014-06-21 DIAGNOSIS — J4489 Other specified chronic obstructive pulmonary disease: Secondary | ICD-10-CM | POA: Diagnosis not present

## 2014-06-21 DIAGNOSIS — R0602 Shortness of breath: Secondary | ICD-10-CM | POA: Diagnosis not present

## 2014-06-21 DIAGNOSIS — R058 Other specified cough: Secondary | ICD-10-CM

## 2014-06-21 DIAGNOSIS — F17200 Nicotine dependence, unspecified, uncomplicated: Secondary | ICD-10-CM | POA: Diagnosis not present

## 2014-06-21 NOTE — Progress Notes (Signed)
Subjective:    Patient ID: Paul [name], male    DOB: 11-29-1940  MRN: [id number]    Brief patient profile:  71 yowm smoker with dx of copd with worse symptoms since 2013 referred to pulmonary clinic 12/24/2012 by Dr Darcus Austin for COPD evaluation with GOLD II criteria May 2014    History of Present Illness  12/24/2012 1st pulmonary eval  On advair/ spiriva longterm with freq use of proventil prior to dx  PE presenting as pleurtic L CP May 2013 with "moderate clot burden" to  [facility] [address] assoc with PAF and started on Xarelto and baseline doe x climbing steps but could walk flat all day long before and after PE but then indolent onset doe progressively worse x 9 months assoc with cough congestion > green mucus better p on levaquin x one week but convinced his coughing and breathing problems are all due to xarelto because of what he read on the package. rec Only use your albuterol (proaire) as a rescue medication (plan B)  to be used if you can't catch your breath   The key is to stop smoking completely before smoking completely stops you     02/05/2013 f/u ov/Paul [name] cc need less albuterol maint on advair and spiriva with worse cough and sob x mailbox and back and collapse in chair.   Mucus turned green again x 2 weeks prior to OV   rec Stop advair and lisinopril Start symbicort 160 Take 2 puffs first thing in am and then another 2 puffs about 12 hours later.  Only use your albuterol(proaire)  as a rescue medication to be used if you can't catch your breath by resting or doing a relaxed purse lip breathing pattern. The less you use it, the better it will work when you need it.  Prednisone 10 mg take  4 each am x 2 days,   2 each am x 2 days,  1 each am x2days and stop  Levaquin 750 x 5 days For cough mucinex dm best as needed  03/26/2013 f/u ov/Paul [name] re copd Chief Complaint  Patient presents with  . Follow-up    Breathing is unchanged since the last visit. Ran out of symbicort so started back on  advair about 2 wks ago. He could not tell difference between the 2 meds, but spouse noted he did not cough as much on symbicort.  mucus is more day than night, usually not green rec Ok to stop spiriva Work on Engineer, technical sales technique:  If your mouth or throat starts to bother you Call if drainage issues and throat congestion persist  For sinus CT and follow up office visit - call [facility] at [phone] to set this up > did not do    06/21/2014 f/u ov/Paul [name] re: GOLD II COPD / still smoking, concerned re side effects of meds  Chief Complaint  Patient presents with  . Follow-up    Pt states had six minute walk test which was abnormal. He states that he was referred here to discuss this per Dr Darcus Austin. Pt was last seen here in May 2014 and states that his breathing is unchanged since then.     lots of phlegm and congestion each am and occ wakes at 3 am but mostly daytime. 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 No unusual exp hx or h/o childhood pna/ asthma or premature birth to his knowledge.   Current Medications, Allergies, Past Medical History, Past Surgical History, Family History, and Social History were reviewed in [employer] record.  ROS  The following are not active complaints unless bolded sore throat, dysphagia, dental problems, itching, sneezing,  nasal congestion or excess/ purulent secretions, ear ache,   fever, chills, sweats, unintended wt loss, pleuritic or exertional cp, hemoptysis,  orthopnea pnd or leg swelling, presyncope, palpitations, heartburn, abdominal pain, anorexia, nausea, vomiting, diarrhea  or change  in bowel or urinary habits, change in stools or urine, dysuria,hematuria,  rash, arthralgias, visual complaints, headache, numbness weakness or ataxia or problems with walking or coordination,  change in mood/affect or memory.               Objective:   Physical Exam  02/05/2013  Wt 266 > 03/26/2013  269 > 06/21/2014 261  Wt Readings from Last 3 Encounters:  12/24/12 259 lb 9.6 oz (117.754 kg)  12/23/12 258 lb (117.028 kg)  07/09/12 255 lb (115.667 kg)    amb obese wm with rattling cough/ congested sounding w/in 30 min of alb   HEENT mild turbinate edema.  Oropharynx no thrush or excess pnd or cobblestoning.  No JVD or cervical adenopathy. Mild accessory muscle hypertrophy. Trachea midline, nl thryroid. Chest was hyperinflated by percussion with diminished breath sounds and moderate increased exp time without wheeze. Hoover sign positive at mid inspiration. Regular rate and rhythm without murmur gallop or rub or increase P2 or edema.  Abd: no hsm, nl excursion. Ext warm without cyanosis or clubbing.       CXR  06/21/2014 : Grossly stable postoperative chest. No acute process or evidence of metastatic disease.        Assessment & Plan:

## 2014-06-21 NOTE — Patient Instructions (Addendum)
Please see patient coordinator before you leave today  to schedule sinus CT  Please remember to go to the xray  department downstairs for your tests - we will call you with the results when they are available.  To avoid having to be placed on oxygen, walk slower and stop smoking.  Please schedule a follow up office visit in 6 weeks, call sooner if needed  Late add:  Return for d dimer and if elevated needs CTa Chest next

## 2014-06-22 ENCOUNTER — Other Ambulatory Visit: Payer: Medicare Other

## 2014-06-22 DIAGNOSIS — R0902 Hypoxemia: Secondary | ICD-10-CM | POA: Diagnosis not present

## 2014-06-22 LAB — D-DIMER, QUANTITATIVE: D-Dimer, Quant: 0.54 ug/mL-FEU — ABNORMAL HIGH (ref 0.00–0.48)

## 2014-06-22 NOTE — Assessment & Plan Note (Addendum)
06/21/2014   Walked RA x one lap @ 185 stopped due to  desat to 84% but no more sob than usual, nl pace> declined 02   He has remote h/o PE but only took xarelto for 3 m and denies increase in doe x 2 years   rec baseline D dimer and if elevated needs CTa next, discussed with pt by phone 06/22/2014

## 2014-06-22 NOTE — Assessment & Plan Note (Addendum)
-  HFA 90% 03/26/2013  - PFT's 03/12/13 FEV1  2.27 (79%) ratio 52 and no better p B2 DLCO 45 and 60% p correction - Ex desats documented 06/21/14 > refused 02   I had an extended discussion with the patient today lasting 15 to 20 minutes of a 25 minute visit on the following issues:  He really has not had sign decline in ex tol x last 2y and may well be he always desaturated but since refuses 02 and stop smoking there's little else to offer at this point other than to be sure he hasn't had recurrent pe (see ex hypoxemia)    Each maintenance medication was reviewed in detail including most importantly the difference between maintenance and as needed and under what circumstances the prns are to be used.  Please see instructions for details which were reviewed in writing and the patient given a copy.

## 2014-06-22 NOTE — Assessment & Plan Note (Signed)

## 2014-06-22 NOTE — Assessment & Plan Note (Signed)
His main complaint is really not limting sob but rather the cough which does improve on abx/ steroids then relapses  Most likely this is cb from smoking which is also contributing to atx/ v/q mismatching and hypoxemia but will complete the w/u with sinus CT

## 2014-06-22 NOTE — Progress Notes (Signed)
Quick Note:  Spoke with pt and notified of results per Dr. Wert. Pt verbalized understanding and denied any questions.  ______ 

## 2014-06-23 NOTE — Progress Notes (Signed)
Quick Note:  Spoke with pt and notified of results per Dr. Wert. Pt verbalized understanding and denied any questions.  ______ 

## 2014-06-25 ENCOUNTER — Encounter: Payer: Self-pay | Admitting: Internal Medicine

## 2014-06-25 ENCOUNTER — Ambulatory Visit (INDEPENDENT_AMBULATORY_CARE_PROVIDER_SITE_OTHER)
Admission: RE | Admit: 2014-06-25 | Discharge: 2014-06-25 | Disposition: A | Discharge status: Home / Self Care | Payer: Medicare Other | Source: Ambulatory Visit | Attending: Internal Medicine | Admitting: Internal Medicine

## 2014-06-25 DIAGNOSIS — R05 Cough: Secondary | ICD-10-CM

## 2014-06-25 DIAGNOSIS — R059 Cough, unspecified: Secondary | ICD-10-CM

## 2014-06-25 DIAGNOSIS — R058 Other specified cough: Secondary | ICD-10-CM

## 2014-06-25 NOTE — Progress Notes (Signed)
Quick Note:  Spoke with pt and notified of results per Dr. Wert. Pt verbalized understanding and denied any questions.  ______ 

## 2014-07-09 ENCOUNTER — Ambulatory Visit (INDEPENDENT_AMBULATORY_CARE_PROVIDER_SITE_OTHER): Payer: Medicare Other | Admitting: *Deleted

## 2014-07-09 DIAGNOSIS — E1142 Type 2 diabetes mellitus with diabetic polyneuropathy: Secondary | ICD-10-CM

## 2014-07-09 DIAGNOSIS — E1140 Type 2 diabetes mellitus with diabetic neuropathy, unspecified: Secondary | ICD-10-CM

## 2014-07-09 DIAGNOSIS — E1149 Type 2 diabetes mellitus with other diabetic neurological complication: Secondary | ICD-10-CM

## 2014-07-09 NOTE — Patient Instructions (Signed)
Our office will notify you once your diabetic shoes and insoles arrive. At that time an appointment will be needed to pick them up.

## 2014-07-09 NOTE — Progress Notes (Signed)
Measured for diabetic shoes and insoles.

## 2014-08-02 ENCOUNTER — Telehealth: Payer: Self-pay | Admitting: *Deleted

## 2014-08-02 NOTE — Telephone Encounter (Signed)
I'm checking to see if my shoes and inserts came in.  Just give me a call.    I called and informed him that his shoes and inserts are here.  I asked if he would like to schedule an appointment to pick them up.  He state yes so I transferred him to a scheduler.

## 2014-08-03 ENCOUNTER — Ambulatory Visit: Payer: Medicare Other | Admitting: Internal Medicine

## 2014-08-04 ENCOUNTER — Encounter: Payer: Self-pay | Admitting: Internal Medicine

## 2014-08-04 ENCOUNTER — Ambulatory Visit (INDEPENDENT_AMBULATORY_CARE_PROVIDER_SITE_OTHER): Payer: Medicare Other | Admitting: Internal Medicine

## 2014-08-04 VITALS — BP 120/74 | HR 83 | Temp 98.1°F | Ht 69.5 in | Wt 255.0 lb

## 2014-08-04 DIAGNOSIS — J449 Chronic obstructive pulmonary disease, unspecified: Secondary | ICD-10-CM | POA: Diagnosis not present

## 2014-08-04 DIAGNOSIS — R05 Cough: Secondary | ICD-10-CM | POA: Diagnosis not present

## 2014-08-04 DIAGNOSIS — F17200 Nicotine dependence, unspecified, uncomplicated: Secondary | ICD-10-CM | POA: Diagnosis not present

## 2014-08-04 DIAGNOSIS — R059 Cough, unspecified: Secondary | ICD-10-CM

## 2014-08-04 NOTE — Assessment & Plan Note (Signed)
-  sinus CT 06/25/2014 > nl   Typical of MC dysfunction > rec max dose mucinex and stop smoking  Pulmonary f/u is prn

## 2014-08-04 NOTE — Assessment & Plan Note (Signed)
-  HFA 90% 03/26/2013  - PFT's 03/12/13 FEV1  2.27 (79%) ratio 52 and no better p B2 DLCO 45 and 60% p correction - Ex desats documented 06/21/14 > refused 02   I had an extended discussion with the patient today lasting 15 to 20 minutes of a 25 minute visit on the following issues:   Nothing else to offer here if can't stop smoking > no change rx needed

## 2014-08-04 NOTE — Progress Notes (Signed)
Subjective:    Patient ID: Paul [name], male    DOB: 1941-03-11  MRN: [id number]    Brief patient profile:  2 yowm smoker with dx of copd with worse symptoms since 2013 referred to pulmonary clinic 12/24/2012 by Dr Darcus Austin for COPD evaluation with GOLD II criteria May 2014    History of Present Illness  12/24/2012 1st pulmonary eval  On advair/ spiriva longterm with freq use of proventil prior to dx  PE presenting as pleurtic L CP May 2013 with "moderate clot burden" to  [facility] assoc with PAF and started on Xarelto and baseline doe x climbing steps but could walk flat all day long before and after PE but then indolent onset doe progressively worse x 9 months assoc with cough congestion > green mucus better p on levaquin x one week but convinced his coughing and breathing problems are all due to xarelto because of what he read on the package. rec Only use your albuterol (proaire) as a rescue medication (plan B)  to be used if you can't catch your breath   The key is to stop smoking completely before smoking completely stops you     02/05/2013 f/u ov/Paul [name] cc need less albuterol maint on advair and spiriva with worse cough and sob x mailbox and back and collapse in chair.   Mucus turned green again x 2 weeks prior to OV   rec Stop advair and lisinopril Start symbicort 160 Take 2 puffs first thing in am and then another 2 puffs about 12 hours later.  Only use your albuterol(proaire)  as a rescue medication to be used if you can't catch your breath by resting or doing a relaxed purse lip breathing pattern. The less you use it, the better it will work when you need it.  Prednisone 10 mg take  4 each am x 2 days,   2 each am x 2 days,  1 each am x2days and stop  Levaquin 750 x 5 days For cough mucinex dm best as needed  03/26/2013 f/u ov/Paul [name] re copd Chief Complaint  Patient presents with  . Follow-up    Breathing is unchanged since the last visit. Ran out of symbicort so started back on  advair about 2 wks ago. He could not tell difference between the 2 meds, but spouse noted he did not cough as much on symbicort.  mucus is more day than night, usually not green rec Ok to stop spiriva Work on Engineer, technical sales technique:      06/21/2014 f/u ov/Paul [name] re: GOLD II COPD / still smoking, concerned re side effects of meds  Chief Complaint  Patient presents with  . Follow-up    Pt states had six minute walk test which was abnormal. He states that he was referred here to discuss this per Dr Darcus Austin. Pt was last seen here in May 2014 and states that his breathing is unchanged since then.     lots of phlegm and congestion each am and occ wakes at 3 am but mostly daytime. 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 Follow-up    Pt states that his cough and SOB are unchanged since the last visit. No new co's today.    no proair  Day of ov/ pacing himself and no longer doing steps so less sob/ less saba need - rattling cough esp in am, thick, slt green, not on max dose mucinex but seems to helps whereas even levaquin did not  No obvious daytime variabilty or assoc cp or chest tightness, subjective wheeze overt sinus or hb symptoms. No unusual exp hx or h/o childhood pna/ asthma or premature birth to his knowledge.   Current Medications, Allergies, Past Medical History, Past Surgical History, Family History, and Social History were reviewed in [employer] record.  ROS  The following are not active complaints unless bolded sore throat, dysphagia, dental problems, itching, sneezing,  nasal congestion  or excess/ purulent secretions, ear ache,   fever, chills, sweats, unintended wt loss, pleuritic or exertional cp, hemoptysis,  orthopnea pnd or leg swelling, presyncope, palpitations, heartburn, abdominal pain, anorexia, nausea, vomiting, diarrhea  or change in bowel or urinary habits, change in stools or urine, dysuria,hematuria,  rash, arthralgias, visual complaints, headache, numbness weakness or ataxia or problems with walking or coordination,  change in mood/affect or memory.          Objective:   Physical Exam  02/05/2013  Wt 266 > 03/26/2013  269 > 06/21/2014 261 > 08/04/2014  256  Wt Readings from Last 3 Encounters:  12/24/12 259 lb 9.6 oz (117.754 kg)  12/23/12 258 lb (117.028 kg)  07/09/12 255 lb (115.667 kg)    amb obese wm with rattling cough/ congested sounding   HEENT mild turbinate edema.  Oropharynx no thrush or excess pnd or cobblestoning.  No JVD or cervical adenopathy. Mild accessory muscle hypertrophy. Trachea midline, nl thryroid. Chest was hyperinflated by percussion with diminished breath sounds and moderate increased exp time without wheeze. Hoover sign positive at mid inspiration. Regular rate and rhythm without murmur gallop or rub or increase P2 or edema.  Abd: no hsm, nl excursion. Ext warm without cyanosis or clubbing.       CXR  06/21/2014 : Grossly stable postoperative chest. No acute process or evidence of metastatic disease.   sinuc ct neg 06/25/14      Assessment & Plan:

## 2014-08-04 NOTE — Patient Instructions (Addendum)
To avoid having to be placed on oxygen, walk slower and stop smoking completely   Ok to try off reglan to see if makes any difference in any of your symptoms  Best cough medication is mucinex or mucinex dm up to 1200 mg every 12 hours to offset the effects of the cigarettes on your mucus flow (escalator)  Pulmonary follow up is as needed

## 2014-08-04 NOTE — Assessment & Plan Note (Signed)

## 2014-08-10 ENCOUNTER — Ambulatory Visit: Payer: Medicare Other

## 2014-08-11 ENCOUNTER — Ambulatory Visit: Payer: Medicare Other

## 2014-08-16 ENCOUNTER — Ambulatory Visit (INDEPENDENT_AMBULATORY_CARE_PROVIDER_SITE_OTHER): Payer: Medicare Other | Admitting: Cardiology

## 2014-08-16 ENCOUNTER — Encounter: Payer: Self-pay | Admitting: Cardiology

## 2014-08-16 VITALS — BP 140/60 | HR 74 | Ht 69.5 in | Wt 250.1 lb

## 2014-08-16 DIAGNOSIS — I2510 Atherosclerotic heart disease of native coronary artery without angina pectoris: Secondary | ICD-10-CM | POA: Diagnosis not present

## 2014-08-16 DIAGNOSIS — I480 Paroxysmal atrial fibrillation: Secondary | ICD-10-CM

## 2014-08-16 DIAGNOSIS — E785 Hyperlipidemia, unspecified: Secondary | ICD-10-CM | POA: Diagnosis not present

## 2014-08-16 DIAGNOSIS — Z720 Tobacco use: Secondary | ICD-10-CM

## 2014-08-16 DIAGNOSIS — I2583 Coronary atherosclerosis due to lipid rich plaque: Secondary | ICD-10-CM

## 2014-08-16 DIAGNOSIS — I10 Essential (primary) hypertension: Secondary | ICD-10-CM

## 2014-08-16 DIAGNOSIS — F17200 Nicotine dependence, unspecified, uncomplicated: Secondary | ICD-10-CM

## 2014-08-16 NOTE — Assessment & Plan Note (Signed)
Continue aspirin and statin.

## 2014-08-16 NOTE — Assessment & Plan Note (Signed)
Continue statin. 

## 2014-08-16 NOTE — Assessment & Plan Note (Signed)
Patient counseled on discontinuing. 

## 2014-08-16 NOTE — Patient Instructions (Signed)
Your physician wants you to follow-up in: [facility] with dr Trellis Moment will receive a reminder letter in the mail two months in advance. If you don't receive a letter, please call our office to schedule the follow-up appointment.

## 2014-08-16 NOTE — Assessment & Plan Note (Signed)
Continue present blood pressure medications.

## 2014-08-16 NOTE — Assessment & Plan Note (Signed)
Patient remains in sinus rhythm. Previous atrial fibrillation occurred in the setting of pulmonary embolus.

## 2014-08-16 NOTE — Progress Notes (Signed)
HPI: FU atrial fibrillation. Patient states had PCI of LAD in 1988. Last myoview in August of 2013 showed an ejection fraction of 72%. There was inferobasal thinning but no ischemia. Patient previously admitted with a pulmonary embolus. During hospitalization he developed atrial fibrillation with a rapid ventricular response. Troponins were negative. Echocardiogram in May of 2013 showed normal LV function and mild left ventricular hypertrophy. Patient treated with xeralto and cardizem and DCed. TSH in July 2013 1.91. Venous dopplers in June of 2013 with no DVT. Since I last saw him, He has dyspnea on exertion unchanged. No orthopnea, PND, pedal edema or chest pain.   Current Outpatient Prescriptions  Medication Sig Dispense Refill  . acetaminophen (TYLENOL) 500 MG tablet Take 1,000 mg by mouth every 6 (six) hours as needed for pain.       Marland Kitchen albuterol (PROAIR HFA) 108 (90 BASE) MCG/ACT inhaler Inhale 2 puffs into the lungs every 6 (six) hours as needed for wheezing or shortness of breath.      Marland Kitchen aspirin EC 81 MG tablet Take 1 tablet (81 mg total) by mouth daily.  90 tablet  3  . fexofenadine (ALLEGRA) 180 MG tablet Take 180 mg by mouth daily.        . fluticasone (FLONASE) 50 MCG/ACT nasal spray Place 2 sprays into the nose daily.        . Fluticasone-Salmeterol (ADVAIR DISKUS IN) Inhale 1 puff into the lungs 2 (two) times daily.      Marland Kitchen gabapentin (NEURONTIN) 600 MG tablet Take 1,800 mg by mouth at bedtime.      Marland Kitchen glimepiride (AMARYL) 4 MG tablet Take 4 mg by mouth 2 (two) times daily.        Marland Kitchen guaiFENesin (MUCINEX) 600 MG 12 hr tablet Take 1,200 mg by mouth daily.      . hydrochlorothiazide (HYDRODIURIL) 12.5 MG tablet Take 12.5 mg by mouth daily.        Marland Kitchen losartan (COZAAR) 50 MG tablet Take 1 tablet (50 mg total) by mouth daily.  90 tablet  0  . metFORMIN (GLUCOPHAGE) 1000 MG tablet Take 1,000 mg by mouth 2 (two) times daily with a meal.        . metoCLOPramide (REGLAN) 10 MG tablet Take  10 mg by mouth 2 (two) times daily.      . Multiple Vitamin (MULTIVITAMIN) tablet Take 1 tablet by mouth daily. Centrum silver       . pioglitazone (ACTOS) 45 MG tablet Take 45 mg by mouth daily.        . pravastatin (PRAVACHOL) 40 MG tablet Take 1 tablet (40 mg total) by mouth daily.  90 tablet  0  . Vitamin D, Ergocalciferol, (DRISDOL) 50000 UNITS CAPS capsule Take 50,000 Units by mouth every 7 (seven) days.       No current facility-administered medications for this visit.     Past Medical History  Diagnosis Date  . Diabetes mellitus   . Hypertension   . COPD (chronic obstructive pulmonary disease)   . Asthma   . GERD (gastroesophageal reflux disease)   . Osteopenia   . Pulmonary embolus   . Atrial fibrillation   . Hyperlipidemia   . CAD (coronary artery disease)     Prior PTCA  . Nephrolithiasis   . Tremor, essential   . lung ca dx'd 01/2006    Past Surgical History  Procedure Laterality Date  . Lung cancer surgery  01/2006    Small excision  of left lung tissue; mesh with radioactive seeds (per pt report)  . Cholecystectomy    . Tonsillectomy    . Angioplasty  Approx 1993  . Cardiac catheterization  Approximately 1993  . Fixation kyphoplasty      History   Social History  . Marital Status: Married    Spouse Name: Santiago Glad    Number of Children: 2  . Years of Education: college   Occupational History  .      Retired from [employer] and [employer]  . Smoking status: Current Every Day Smoker -- 0.50 packs/day for 59 years    Types: Cigarettes  . Smokeless tobacco: Never Used  . Alcohol Use: 0.6 oz/week    1 Glasses of wine per week     Comment: Rare  . Drug Use: No  . Sexual Activity: No   Other Topics Concern  . Not on file   Social History Narrative   Patient lives at home with his wife Santiago Glad).   Retired.   Education- College    Right handed.   Caffeine- three cups of coffee daily.    ROS: Chronic productive cough but no fevers or  chills, hemoptysis, dysphasia, odynophagia, melena, hematochezia, dysuria, hematuria, rash, seizure activity, orthopnea, PND, pedal edema, claudication. Remaining systems are negative.  Physical Exam: Well-developed obese in no acute distress.  Skin is warm and dry.  HEENT is normal.  Neck is supple.  Chest diminished BS Cardiovascular exam is regular rate and rhythm.  Abdominal exam nontender or distended. No masses palpated. Extremities show no edema. neuro grossly intact  ECG Sinus rhythm at a rate of 74. Right bundle branch block.

## 2014-08-31 ENCOUNTER — Ambulatory Visit (INDEPENDENT_AMBULATORY_CARE_PROVIDER_SITE_OTHER): Payer: Medicare Other

## 2014-08-31 VITALS — BP 122/59 | HR 68 | Resp 16

## 2014-08-31 DIAGNOSIS — E1140 Type 2 diabetes mellitus with diabetic neuropathy, unspecified: Secondary | ICD-10-CM

## 2014-08-31 DIAGNOSIS — Q828 Other specified congenital malformations of skin: Secondary | ICD-10-CM

## 2014-08-31 DIAGNOSIS — M19079 Primary osteoarthritis, unspecified ankle and foot: Secondary | ICD-10-CM

## 2014-08-31 DIAGNOSIS — M2040 Other hammer toe(s) (acquired), unspecified foot: Secondary | ICD-10-CM

## 2014-08-31 DIAGNOSIS — E11610 Type 2 diabetes mellitus with diabetic neuropathic arthropathy: Secondary | ICD-10-CM

## 2014-08-31 NOTE — Patient Instructions (Signed)
Diabetic Shoe and Insoles Instructions   Congratulations on receiving your new shoes and insoles. In accordance with Medicare regulations, they have been selected from our own inventory or have been special ordered to provide you with the optimum comfort and protection. In order to receive the greatest benefit from this footwear, please follow these suggested guidelines.   Initial use of your diabetic shoes It may take a couple of days for you to feel fully comfortable wearing your new diabetic shoes and insoles. Some people are immediately comfortable wearing them, while others need more time to adjust. Generally, the body does not adapt to change rapidly and you may experience mild aches or fatigue when you first begin to wear your shoes.  Wear these shoes as long as they are comfortable. Try to only wear them indoors until you feel that they are comfortable for outdoor use.  If they bother your feet, TAKE THEM OFF and try them again a few hours later or the next day. Check for any soreness or swelling and notify your doctor immediately if you notice any of these signs. Otherwise, increase wear time as they become more comfortable. If you feel like your shoes are bothersome still after trying these tips, please call our office. Keep in mind, that once the outer sole of the shoe becomes dirty, we can no longer send them back.  The insoles should be changed every 4 months and replaced with a new pair.   Maintenance of your new shoes Your new diabetic shoes can be washed with mild soap and water and air dried. Try to keep away from high heat sources (heaters, dryers, fireplaces, etc.) as this may damage or distort the plastazote lining and insert in the shoe. Do NOT machine wash or use harsh chemicals such as bleach on your shoes. These shoes, if properly taken care of, should last one year.   Continuing Care Diabetic patients have fewer foot complications if they have been properly fitted in the  correct type of footwear and accommodating insoles. The desired outcome is to fit you with the most appropriate, best fitting shoe possible in order to reduce the risk of and prevent foot complications, which could ultimately lead to ulceration, infection and amputation. Medicare will pay for one pair of extra depth shoes and 3 pairs of insoles per calendar year.   REMEMBER TO SEE YOUR PODIATRIST FOR REGULAR FOOT EXAMS AT LEAST ONCE PER YEAR.

## 2014-08-31 NOTE — Progress Notes (Signed)
Subjective:    Patient ID: [name], male    DOB: 04-17-1941, 73 y.o.   MRN: [id number]  HPI Comments: Pt presents for pick-up of diabetic shoes.  Pt's custom molded orthotics were fitted into [id number].  I gave pt oral and printed diabetic shoe wearing instructions.  Diabetes      Review of Systems no new findings or systemic changes noted     Objective:   Physical Exam Neurovascular status intact and unchanged pedal pulses are palpable DP +2 PT one over 4 bilateral Refill time 3 seconds epicritic sensation decreased to the forefoot digits there digital contractures noted. Debridement of lower month ago nails thick brittle crumbly dystrophic followup and nail care in the next 23 months at this time patient is digital contractures history keratoses which are being well-managed at this time for a wearing diabetic shoes and insoles which have been benefiting him preventing keratosis of ulceration. At this time 1 pair shoes and 3 pairs of dual dual density Plastizote inlays are dispensed a fit and contour well to the foot with full contact to the foot arch is appropriate       Assessment & Plan:  Assessment diabetes with history peripheral neuropathy deformity and digital contractures and keratoses patient been maintain diabetic shoes and have been successful this time the  Good shoes and 3 pairs of insoles are dispensed with use in were instructions given. We'll recheck in 2 months for palliative care in the future as needed maintain diabetic shoes as instructed a fit and contour well actually replacing shoes with the identical shoe again previously previous shoes are worn and breaking down the replacement at this time. Next paragraph Harriet Masson DPM

## 2014-09-03 DIAGNOSIS — R509 Fever, unspecified: Secondary | ICD-10-CM | POA: Diagnosis not present

## 2014-09-03 DIAGNOSIS — J449 Chronic obstructive pulmonary disease, unspecified: Secondary | ICD-10-CM | POA: Diagnosis not present

## 2014-09-03 DIAGNOSIS — J209 Acute bronchitis, unspecified: Secondary | ICD-10-CM | POA: Diagnosis not present

## 2014-09-03 DIAGNOSIS — F17210 Nicotine dependence, cigarettes, uncomplicated: Secondary | ICD-10-CM | POA: Diagnosis not present

## 2014-09-11 ENCOUNTER — Other Ambulatory Visit: Payer: Self-pay | Admitting: Cardiology

## 2014-09-12 DIAGNOSIS — Z23 Encounter for immunization: Secondary | ICD-10-CM | POA: Diagnosis not present

## 2014-10-27 ENCOUNTER — Ambulatory Visit (INDEPENDENT_AMBULATORY_CARE_PROVIDER_SITE_OTHER): Payer: Medicare Other | Admitting: Nurse Practitioner

## 2014-10-27 ENCOUNTER — Encounter: Payer: Self-pay | Admitting: Nurse Practitioner

## 2014-10-27 VITALS — BP 135/69 | HR 77 | Temp 96.0°F | Resp 22 | Ht 69.5 in | Wt 250.0 lb

## 2014-10-27 DIAGNOSIS — G609 Hereditary and idiopathic neuropathy, unspecified: Secondary | ICD-10-CM

## 2014-10-27 DIAGNOSIS — IMO0002 Reserved for concepts with insufficient information to code with codable children: Secondary | ICD-10-CM | POA: Insufficient documentation

## 2014-10-27 DIAGNOSIS — I2510 Atherosclerotic heart disease of native coronary artery without angina pectoris: Secondary | ICD-10-CM | POA: Diagnosis not present

## 2014-10-27 DIAGNOSIS — E1165 Type 2 diabetes mellitus with hyperglycemia: Secondary | ICD-10-CM | POA: Insufficient documentation

## 2014-10-27 DIAGNOSIS — M792 Neuralgia and neuritis, unspecified: Secondary | ICD-10-CM

## 2014-10-27 DIAGNOSIS — K219 Gastro-esophageal reflux disease without esophagitis: Secondary | ICD-10-CM | POA: Insufficient documentation

## 2014-10-27 DIAGNOSIS — E668 Other obesity: Secondary | ICD-10-CM | POA: Insufficient documentation

## 2014-10-27 DIAGNOSIS — I10 Essential (primary) hypertension: Secondary | ICD-10-CM | POA: Insufficient documentation

## 2014-10-27 DIAGNOSIS — J449 Chronic obstructive pulmonary disease, unspecified: Secondary | ICD-10-CM | POA: Insufficient documentation

## 2014-10-27 DIAGNOSIS — E119 Type 2 diabetes mellitus without complications: Secondary | ICD-10-CM | POA: Insufficient documentation

## 2014-10-27 DIAGNOSIS — G629 Polyneuropathy, unspecified: Secondary | ICD-10-CM | POA: Insufficient documentation

## 2014-10-27 DIAGNOSIS — Z85118 Personal history of other malignant neoplasm of bronchus and lung: Secondary | ICD-10-CM | POA: Insufficient documentation

## 2014-10-27 DIAGNOSIS — F17200 Nicotine dependence, unspecified, uncomplicated: Secondary | ICD-10-CM | POA: Insufficient documentation

## 2014-10-27 DIAGNOSIS — M810 Age-related osteoporosis without current pathological fracture: Secondary | ICD-10-CM | POA: Insufficient documentation

## 2014-10-27 DIAGNOSIS — E7800 Pure hypercholesterolemia, unspecified: Secondary | ICD-10-CM | POA: Insufficient documentation

## 2014-10-27 MED ORDER — GABAPENTIN 300 MG PO CAPS: 1800.0000 mg | ORAL_CAPSULE | Freq: Every day | ORAL | Status: DC

## 2014-10-27 NOTE — Patient Instructions (Signed)
Will change gabapentin from 600 mg 3 at night to 300 mg 6 at night per patient request Objective exam has not changed Follow-up yearly and when necessary

## 2014-10-27 NOTE — Progress Notes (Signed)
[facility]  PATIENT: Paul [name] DOB: 19-Mar-1941   REASON FOR VISIT: Follow-up for neuropathy  HISTORY OF PRESENT ILLNESS: Paul [name] is a 73 -year-old right-handed Caucasian male, came in to followup his peripheral neuropathy, clinical visit was November 2013,   He has past medical history of obesity, hypertension, poorly controlled diabetes, flatfeet, presenting with few years history of gradual onset, very slowly progressing, length dependent axonal peripheral neuropathy, which is confirmed by electrodiagnostic study, mild to moderately severe, also complicated by his lifelong history of flatfeet.  He continues to do well, with his bilateral feet parethesia, well controlled by Neurontin 300 mg +3 tablets 826m every night. He complains of daytime sleepiness, dozing off while watching TV at 7 clock each evening, he reports previous history of sleep study, decline repeat study. Independent in ADL's, driving without problems. One fall in the past year, going up over a curb, now using cane. No new neurologic complaints.   He is taking Neurontin 803m3 tabs qhs, he is mildly symptomatic of his peripheral neuropathy from his current management, he has bilateral flat feet, mild gait difficulty, he also complains of irregular sleep pattern, he went to bed around 10 PM, after taking his Neurontin 800 mg 3 tablets, he was able to sleep 3-4 hours, afterwards, he needs to use the bathroom, sleep another to 3 hours, he become widely awake at 5 AM, difficulty falling back to sleep again, but after breakfast, he took to 3 hours nap in the morning, he is not exercising regularly  Update October 28 2013,  After taking his Neurontin, 600 mg 3 tablets every night, his symptoms is under reasonable control, he also has a history of lumbar compression fracture in the past, status post vertebroplasty, but he continued to have mild low back pain, gait difficulty  UPDATE  10/27/2014 Paul [name], [age]ear old male returns for follow-up. He has a history of peripheral neuropathy and medical history of obesity, hypertension, and poorly controlled diabetes. He has a slowly progressing length dependent axonal peripheral neuropathy which was confirmed by electrodiagnostic study mild to moderately severe. He is currently on gabapentin 600 mg pills but wants to switch to the capsules. He denies any recent falls and his symptoms are under good control. He returns for evaluation and refills   REVIEW OF SYSTEMS: Full 14 system review of systems performed and notable only for those listed, all others are neg:  Constitutional: N/A  Cardiovascular: N/A  Ear/Nose/Throat: N/A  Skin: N/A  Eyes: N/A  Respiratory: Cough  Gastroitestinal: Abdominal pain  Hematology/Lymphatic: N/A  Endocrine: N/A Musculoskeletal:N/A  Allergy/Immunology: N/A  Neurological: N/A Psychiatric: N/A Sleep : NA   ALLERGIES: Allergies  Allergen Reactions  . Codeine Anaphylaxis  . Phenobarbital Hypertension    HOME MEDICATIONS: Outpatient Prescriptions Prior to Visit  Medication Sig Dispense Refill  . acetaminophen (TYLENOL) 500 MG tablet Take 1,000 mg by mouth every 6 (six) hours as needed for pain.     . Marland Kitchenlbuterol (PROAIR HFA) 108 (90 BASE) MCG/ACT inhaler Inhale 2 puffs into the lungs every 6 (six) hours as needed for wheezing or shortness of breath.    . Marland Kitchenspirin EC 81 MG tablet Take 1 tablet (81 mg total) by mouth daily. 90 tablet 3  . fexofenadine (ALLEGRA) 180 MG tablet Take 180 mg by mouth daily.      . fluticasone (FLONASE) 50 MCG/ACT nasal spray Place 2 sprays into the nose daily.      . Fluticasone-Salmeterol (ADVAIR DISKUS IN) Inhale  1 puff into the lungs 2 (two) times daily.    Marland Kitchen gabapentin (NEURONTIN) 600 MG tablet Take 1,800 mg by mouth at bedtime.    Marland Kitchen glimepiride (AMARYL) 4 MG tablet Take 4 mg by mouth 2 (two) times daily.      Marland Kitchen guaiFENesin (MUCINEX) 600 MG 12 hr tablet Take  1,200 mg by mouth daily.    . hydrochlorothiazide (HYDRODIURIL) 12.5 MG tablet Take 12.5 mg by mouth daily.      Marland Kitchen losartan (COZAAR) 50 MG tablet Take 1 tablet (50 mg total) by mouth daily. 90 tablet 0  . metFORMIN (GLUCOPHAGE) 1000 MG tablet Take 1,000 mg by mouth 2 (two) times daily with a meal.      . metoCLOPramide (REGLAN) 10 MG tablet Take 10 mg by mouth 2 (two) times daily.    . Multiple Vitamin (MULTIVITAMIN) tablet Take 1 tablet by mouth daily. Centrum silver     . pioglitazone (ACTOS) 45 MG tablet Take 45 mg by mouth daily.      . pravastatin (PRAVACHOL) 40 MG tablet Take 1 tablet (40 mg total) by mouth daily. 90 tablet 3  . Vitamin D, Ergocalciferol, (DRISDOL) 50000 UNITS CAPS capsule Take 50,000 Units by mouth every 7 (seven) days.     No facility-administered medications prior to visit.    PAST MEDICAL HISTORY: Past Medical History  Diagnosis Date  . Diabetes mellitus   . Hypertension   . COPD (chronic obstructive pulmonary disease)   . Asthma   . GERD (gastroesophageal reflux disease)   . Osteopenia   . Pulmonary embolus   . Atrial fibrillation   . Hyperlipidemia   . CAD (coronary artery disease)     Prior PTCA  . Nephrolithiasis   . Tremor, essential   . lung ca dx'd 01/2006    PAST SURGICAL HISTORY: Past Surgical History  Procedure Laterality Date  . Lung cancer surgery  01/2006    Small excision of left lung tissue; mesh with radioactive seeds (per pt report)  . Cholecystectomy    . Tonsillectomy    . Angioplasty  Approx 1993  . Cardiac catheterization  Approximately 1993  . Fixation kyphoplasty      FAMILY HISTORY: Family History  Problem Relation Age of Onset  . Heart disease Mother   . Tremor Mother   . Heart Problems Father   . Dementia Mother     SOCIAL HISTORY: History   Social History  . Marital Status: Married    Spouse Name: Paul [name]    Number of Children: 2  . Years of Education: college   Occupational History  .      Retired from  [employer] and [employer] [place] [employer]  . Smoking status: Current Every Day Smoker -- 0.50 packs/day for 59 years    Types: Cigarettes  . Smokeless tobacco: Never Used  . Alcohol Use: 0.6 oz/week    1 Glasses of wine per week     Comment: Rare  . Drug Use: No  . Sexual Activity: No   Other Topics Concern  . Not on file   Social History Narrative   Patient lives at home with his wife Paul [name]).   Retired.   Education- College    Right handed.   Caffeine- three cups of coffee daily.     PHYSICAL EXAM  Filed Vitals:   10/27/14 1357  BP: 135/69  Pulse: 77  Temp: 96 F (35.6 C)  Resp: 22  Height: 5' 9.5" (1.765  m)  Weight: 250 lb (113.399 kg)   Body mass index is 36.4 kg/(m^2). Generalized: In no acute distress, obese male Neck: Supple, no carotid bruits  Cardiac: Regular rate rhythm Pulmonary: Clear to auscultation bilaterally Musculoskeletal: No deformity  Neurological examination Mentation: Alert oriented to time, place, history taking, and causual conversation Cranial nerve II-XII: Pupils were equal round reactive to light extraocular movements were full, Visual field were full on confrontational test. Bilateral fundi were sharp. Facial sensation and strength were normal. Hearing was intact to finger rubbing bilaterally. Uvula tongue midline. head turning and shoulder shrug and were normal and symmetric.Tongue protrusion into cheek strength was normal. Motor: mild bilateral ankle dorsiflexion weakness. Sensory: length dependent decreased to fine touch, pinprick to midshin, absent toe vibratory sensation, and proprioception at toes. Coordination: Normal finger to nose, heel-to-shin bilaterally there was no truncal ataxia Gait: Rising up from seated position by pushing on chair arm, flat foot, mild bilateral foot drop, unsteady with tandem  Romberg signs: Negative Deep tendon reflexes: Brachioradialis 2/2, biceps 2/2, triceps 2/2, patellar 1/1, Achilles absent, plantar  responses were flexor bilaterally.     DIAGNOSTIC DATA (LABS, IMAGING, TESTING) - I reviewed patient records, labs, notes, testing and imaging myself where available.  Lab Results  Component Value Date   WBC 8.8 01/13/2014   HGB 14.1 01/13/2014   HCT 42.8 01/13/2014   MCV 83.9 01/13/2014   PLT 242 01/13/2014      Component Value Date/Time   NA 140 01/13/2014 0922   NA 139 05/26/2012 0901   NA 145 01/10/2012 0859   K 4.6 01/13/2014 0922   K 4.4 05/26/2012 0901   K 4.5 01/10/2012 0859   CL 105 01/13/2013 0856   CL 106 05/26/2012 0901   CL 98 01/10/2012 0859   CO2 26 01/13/2014 0922   CO2 25 05/26/2012 0901   CO2 27 01/10/2012 0859   GLUCOSE 112 01/13/2014 0922   GLUCOSE 174* 01/13/2013 0856   GLUCOSE 115* 05/26/2012 0901   GLUCOSE 112 01/10/2012 0859   BUN 20.1 01/13/2014 0922   BUN 16 05/26/2012 0901   BUN 19 01/10/2012 0859   CREATININE 0.9 01/13/2014 0922   CREATININE 0.8 05/26/2012 0901   CREATININE 0.7 01/10/2012 0859   CALCIUM 9.9 01/13/2014 0922   CALCIUM 9.3 05/26/2012 0901   CALCIUM 9.1 01/10/2012 0859   PROT 6.7 01/13/2014 0922   PROT 6.6 05/26/2012 0901   PROT 6.6 01/10/2012 0859   ALBUMIN 3.7 01/13/2014 0922   ALBUMIN 3.8 05/26/2012 0901   AST 21 01/13/2014 0922   AST 18 05/26/2012 0901   AST 23 01/10/2012 0859   ALT 25 01/13/2014 0922   ALT 23 05/26/2012 0901   ALT 28 01/10/2012 0859   ALKPHOS 53 01/13/2014 0922   ALKPHOS 41 05/26/2012 0901   ALKPHOS 44 01/10/2012 0859   BILITOT 0.39 01/13/2014 0922   BILITOT 0.4 05/26/2012 0901   BILITOT 0.50 01/10/2012 0859   GFRNONAA >90 03/28/2012 0420   GFRAA >90 03/28/2012 0420     ASSESSMENT AND PLAN  73 y.o. year old male  has a past medical history of Diabetes mellitus; Hypertension; COPD (chronic obstructive pulmonary disease); Asthma;  Pulmonary embolus; Atrial fibrillation; Hyperlipidemia; CAD (coronary artery disease);  Tremor, essential; and peripheral neuropathy . He also has history of  lumbar compression fracture, denies any incontinence or significant low back pain.  Will change gabapentin from 600 mg 3 at night to 300 mg 6 at night per patient request Objective exam  has not changed Weight loss would be beneficial for overall health Follow-up yearly and when necessary Dennie Bible, [facility], [facility], APRN  [facility] [address], [place], [place] [address] [phone]

## 2014-10-28 DIAGNOSIS — E1165 Type 2 diabetes mellitus with hyperglycemia: Secondary | ICD-10-CM | POA: Diagnosis not present

## 2014-10-28 DIAGNOSIS — R109 Unspecified abdominal pain: Secondary | ICD-10-CM | POA: Diagnosis not present

## 2014-11-12 HISTORY — PX: OTHER SURGICAL HISTORY: SHX169

## 2014-11-19 NOTE — Progress Notes (Signed)
I agree above plan.

## 2014-12-25 DIAGNOSIS — J189 Pneumonia, unspecified organism: Secondary | ICD-10-CM | POA: Diagnosis not present

## 2014-12-30 ENCOUNTER — Other Ambulatory Visit: Payer: Self-pay

## 2014-12-30 MED ORDER — GABAPENTIN 300 MG PO CAPS: 1800.0000 mg | ORAL_CAPSULE | Freq: Every day | ORAL | Status: DC

## 2015-01-03 DIAGNOSIS — I10 Essential (primary) hypertension: Secondary | ICD-10-CM | POA: Diagnosis not present

## 2015-01-03 DIAGNOSIS — Z85118 Personal history of other malignant neoplasm of bronchus and lung: Secondary | ICD-10-CM | POA: Diagnosis not present

## 2015-01-03 DIAGNOSIS — K219 Gastro-esophageal reflux disease without esophagitis: Secondary | ICD-10-CM | POA: Diagnosis not present

## 2015-01-03 DIAGNOSIS — I4891 Unspecified atrial fibrillation: Secondary | ICD-10-CM | POA: Diagnosis not present

## 2015-01-03 DIAGNOSIS — M810 Age-related osteoporosis without current pathological fracture: Secondary | ICD-10-CM | POA: Diagnosis not present

## 2015-01-03 DIAGNOSIS — Z0000 Encounter for general adult medical examination without abnormal findings: Secondary | ICD-10-CM | POA: Diagnosis not present

## 2015-01-03 DIAGNOSIS — J449 Chronic obstructive pulmonary disease, unspecified: Secondary | ICD-10-CM | POA: Diagnosis not present

## 2015-01-03 DIAGNOSIS — F329 Major depressive disorder, single episode, unspecified: Secondary | ICD-10-CM | POA: Diagnosis not present

## 2015-01-03 DIAGNOSIS — Z125 Encounter for screening for malignant neoplasm of prostate: Secondary | ICD-10-CM | POA: Diagnosis not present

## 2015-01-03 DIAGNOSIS — E780 Pure hypercholesterolemia: Secondary | ICD-10-CM | POA: Diagnosis not present

## 2015-01-03 DIAGNOSIS — E1165 Type 2 diabetes mellitus with hyperglycemia: Secondary | ICD-10-CM | POA: Diagnosis not present

## 2015-01-05 ENCOUNTER — Telehealth: Payer: Self-pay | Admitting: Internal Medicine

## 2015-01-05 DIAGNOSIS — J449 Chronic obstructive pulmonary disease, unspecified: Secondary | ICD-10-CM

## 2015-01-05 NOTE — Telephone Encounter (Signed)
MW please advise if you are ok for the pt to be placed in pulm rehab.  Dr. Inda Merlin is requesting that this be ordered.  Do you need to see the pt first before this is ordered?  Please advise. Last seen pt 2015.  Thanks  Allergies  Allergen Reactions  . Codeine Anaphylaxis  . Phenobarbital Hypertension    Current Outpatient Prescriptions on File Prior to Visit  Medication Sig Dispense Refill  . acetaminophen (TYLENOL) 500 MG tablet Take 1,000 mg by mouth every 6 (six) hours as needed for pain.     Marland Kitchen ADVAIR DISKUS 250-50 MCG/DOSE AEPB Inhale 1 puff into the lungs 2 (two) times daily.   3  . albuterol (PROAIR HFA) 108 (90 BASE) MCG/ACT inhaler Inhale 2 puffs into the lungs every 6 (six) hours as needed for wheezing or shortness of breath.    Marland Kitchen aspirin EC 81 MG tablet Take 1 tablet (81 mg total) by mouth daily. 90 tablet 3  . fexofenadine (ALLEGRA) 180 MG tablet Take 180 mg by mouth daily.      . fluticasone (FLONASE) 50 MCG/ACT nasal spray Place 2 sprays into the nose daily.      Marland Kitchen gabapentin (NEURONTIN) 300 MG capsule Take 6 capsules (1,800 mg total) by mouth at bedtime. 540 capsule 3  . glimepiride (AMARYL) 4 MG tablet Take 4 mg by mouth 2 (two) times daily.      Marland Kitchen guaiFENesin (MUCINEX) 600 MG 12 hr tablet Take 1,200 mg by mouth daily.    . hydrochlorothiazide (HYDRODIURIL) 12.5 MG tablet Take 12.5 mg by mouth daily.      Marland Kitchen losartan (COZAAR) 50 MG tablet Take 1 tablet (50 mg total) by mouth daily. 90 tablet 0  . metFORMIN (GLUCOPHAGE) 1000 MG tablet Take 1,000 mg by mouth 2 (two) times daily with a meal.      . metoCLOPramide (REGLAN) 10 MG tablet Take 10 mg by mouth 2 (two) times daily.    . Multiple Vitamin (MULTIVITAMIN) tablet Take 1 tablet by mouth daily. Centrum silver     . pioglitazone (ACTOS) 45 MG tablet Take 45 mg by mouth daily.      . pravastatin (PRAVACHOL) 40 MG tablet Take 1 tablet (40 mg total) by mouth daily. 90 tablet 3  . Vitamin D, Ergocalciferol, (DRISDOL) 50000 UNITS  CAPS capsule Take 50,000 Units by mouth every 7 (seven) days.     No current facility-administered medications on file prior to visit.

## 2015-01-05 NOTE — Telephone Encounter (Signed)
lmtcb X1 for bobby

## 2015-01-05 NOTE — Telephone Encounter (Signed)
Fine with me but they may require ov here with pfts as his data is now old by their standards

## 2015-01-06 NOTE — Telephone Encounter (Signed)
Spoke with Paul [name], order placed for rehab.  Nothing further needed at this time.

## 2015-01-11 DIAGNOSIS — H6123 Impacted cerumen, bilateral: Secondary | ICD-10-CM | POA: Diagnosis not present

## 2015-01-11 DIAGNOSIS — H9193 Unspecified hearing loss, bilateral: Secondary | ICD-10-CM | POA: Diagnosis not present

## 2015-01-13 ENCOUNTER — Telehealth (HOSPITAL_COMMUNITY): Payer: Self-pay

## 2015-01-13 NOTE — Telephone Encounter (Signed)
Called patient regarding entrance to Pulmonary Rehab.  Patient states that they are interested in attending the program.  Helen is going to verify insurance coverage and follow up.

## 2015-01-17 ENCOUNTER — Encounter (HOSPITAL_COMMUNITY)
Admission: RE | Admit: 2015-01-17 | Discharge: 2015-01-17 | Disposition: A | Discharge status: Home / Self Care | Payer: Medicare Other | Source: Ambulatory Visit | Attending: Internal Medicine | Admitting: Internal Medicine

## 2015-01-17 VITALS — BP 128/64 | HR 76

## 2015-01-17 DIAGNOSIS — J439 Emphysema, unspecified: Secondary | ICD-10-CM | POA: Diagnosis not present

## 2015-01-17 NOTE — Progress Notes (Signed)
Paul [name] 74 y.o. male Pulmonary Rehab Orientation Note Patient arrived today in Cardiac and Pulmonary Rehab for orientation to Pulmonary Rehab. He was transported from [employer] via wheel chair. He does not carry portable oxygen. Per pt, he uses oxygen never. Color good, skin warm and dry. Patient is oriented to time and place. Patient's medical history and medications reviewed. Heart rate is normal, breath sounds clear to auscultation, no wheezes, rales, or rhonchi. Grip strength equal, strong. Distal pulses 2+ bilateral posterior tibial pulses present. Patient reports he does take medications as prescribed. Patient states he follows a Regular diet. The patient reports no specific efforts to gain or lose weight.  Patient's weight will be monitored closely. Demonstration and practice of PLB using pulse oximeter. Patient able to return demonstration satisfactorily. Safety and hand hygiene in the exercise area reviewed with patient. Patient voices understanding of the information reviewed. Department expectations discussed with patient and achievable goals were set. The patient shows enthusiasm about attending the program and we look forward to working with this nice gentleman. The patient is checking his schedule so as to schedule his 6 minute walk test hopefully this week.  He can start the program thereafter in the 1:30pm class.  Patient reports having mild depression and has discussed this with his PCP, Dr. Darcus Austin.  Presently he does not want to be on antidepressants, he is hoping the exercise will help this.  We discussed his diabetes and the importance of eating and checking his blood sugar before and after exercising.  Patient voiced verbal understanding.   11:45-1500

## 2015-01-25 ENCOUNTER — Encounter (HOSPITAL_COMMUNITY)
Admission: RE | Admit: 2015-01-25 | Discharge: 2015-01-25 | Disposition: A | Discharge status: Home / Self Care | Payer: Medicare Other | Source: Ambulatory Visit | Attending: Internal Medicine | Admitting: Internal Medicine

## 2015-01-25 DIAGNOSIS — E1165 Type 2 diabetes mellitus with hyperglycemia: Secondary | ICD-10-CM | POA: Diagnosis not present

## 2015-01-25 DIAGNOSIS — J439 Emphysema, unspecified: Secondary | ICD-10-CM | POA: Diagnosis not present

## 2015-01-25 NOTE — Progress Notes (Signed)
Laurence completed a Six-Minute Walk Test on 01/25/15 . Calub walked 675 feet with 1 break lasting 33 seconds .  The patient's lowest oxygen saturation was 88% , highest heart rate was 117 bpm , and highest blood pressure was 150/62. The patient was on 2 liters of oxygen with a nasal cannula after desaturation. Mohamad stated that leg fatigue hindered their walk test.

## 2015-01-27 ENCOUNTER — Encounter (HOSPITAL_COMMUNITY)
Admission: RE | Admit: 2015-01-27 | Discharge: 2015-01-27 | Disposition: A | Discharge status: Home / Self Care | Payer: Medicare Other | Source: Ambulatory Visit | Attending: Internal Medicine | Admitting: Internal Medicine

## 2015-01-27 DIAGNOSIS — J439 Emphysema, unspecified: Secondary | ICD-10-CM | POA: Diagnosis not present

## 2015-01-27 NOTE — Progress Notes (Signed)
Today, Damico exercised at [employer]. Service time was from 1330 to 1600.  The patient exercised for more than 31 minutes performing aerobic, strengthening, and stretching exercises. Oxygen saturation, heart rate, blood pressure, rate of perceived exertion, and shortness of breath were all monitored before, during, and after exercise. Yuvraj presented with no problems at today's exercise session. Aston also attended an education session with RD on nutrition for the pulmonary patient.  There was no workload change during today's exercise session.  Pre-exercise vitals: . Weight kg: 114.9 . Liters of O2: ra . SpO2: 92 . HR: 90 . BP: 128/60 . CBG: 184  Exercise vitals: . Highest heartrate:  94 . Lowest oxygen saturation: 86 increased to 94 with PLB and rest break . Highest blood pressure: 120/70 . Liters of 02: 2L  Post-exercise vitals: . SpO2: 96 . HR: 77 . BP: 122/72 . Liters of O2: 2L . CBG: 78 increased to 114 with gingerale  Dr. Brand Males, Medical Director Dr. Frederic Jericho is immediately available during today's Pulmonary Rehab session for [name] on 01/27/2015 at 1330 class time.

## 2015-02-01 ENCOUNTER — Encounter (HOSPITAL_COMMUNITY)
Admission: RE | Admit: 2015-02-01 | Discharge: 2015-02-01 | Disposition: A | Discharge status: Home / Self Care | Payer: Medicare Other | Source: Ambulatory Visit | Attending: Internal Medicine | Admitting: Internal Medicine

## 2015-02-01 DIAGNOSIS — J439 Emphysema, unspecified: Secondary | ICD-10-CM | POA: Diagnosis not present

## 2015-02-01 NOTE — Progress Notes (Signed)
Today, Dyon exercised at [employer]. Service time was from 1330 to 1530.  The patient exercised by preforming aerobic, strengthening, and stretching exercises. Oxygen saturation, heart rate, blood pressure, rate of perceived exertion, and shortness of breath were all monitored before, during, and after exercise. Nealy presented with no problems at today's exercise session. Patient attended the purse lip and diaphragmatic breathing class today.  One workload change was done today. Pre-exercise vitals: . Weight kg: 115.4 . Liters of O2: RA . SpO2: 90 . HR: 84 . BP: 142/60 . CBG: 280  Exercise vitals: . Highest heartrate:  84 . Lowest oxygen saturation: 91 . Highest blood pressure: 132/70 . Liters of 02: 2  Post-exercise vitals: . SpO2: 95 . HR: 82 . BP: 132/70 . Liters of O2: 2 . CBG: 173 Dr. Brand Males, Medical Director Dr. Frederic Jericho is immediately available during today's Pulmonary Rehab session for [name] on 02/01/2015 at 1330 class time.

## 2015-02-03 ENCOUNTER — Other Ambulatory Visit (HOSPITAL_COMMUNITY): Payer: Self-pay | Admitting: *Deleted

## 2015-02-03 ENCOUNTER — Encounter (HOSPITAL_COMMUNITY)
Admission: RE | Admit: 2015-02-03 | Discharge: 2015-02-03 | Disposition: A | Discharge status: Home / Self Care | Payer: Medicare Other | Source: Ambulatory Visit | Attending: Internal Medicine | Admitting: Internal Medicine

## 2015-02-03 DIAGNOSIS — J439 Emphysema, unspecified: Secondary | ICD-10-CM | POA: Diagnosis not present

## 2015-02-03 NOTE — Progress Notes (Signed)
Today, Kendry exercised at [employer]. Service time was from 1330 to 1515.  The patient exercised by performing aerobic, strengthening, and stretching exercises. Oxygen saturation, heart rate, blood pressure, rate of perceived exertion, and shortness of breath were all monitored before, during, and after exercise. Salif presented with no problems at today's exercise session.  Patient attended the risk factor reduction class today.   The patient did not have an increase in workload intensity during today's exercise session.  Pre-exercise vitals: . Weight kg: 114.7 . Liters of O2: 2 . SpO2: 95 . HR: 85 . BP: 116/60 . CBG: 177  Exercise vitals: . Highest heartrate:  107 . Lowest oxygen saturation: 88 which increased to 95 with rest and purse lip breathing . Highest blood pressure: 138/66 . Liters of 02: 2  Post-exercise vitals: . SpO2: 97 . HR: 85 . BP: 110/60 . Liters of O2: 2 . CBG: 105 Dr. Brand Males, Medical Director Dr. Sheran Fava is immediately available during today's Pulmonary Rehab session for [name] on 02/03/2015 at 1330 class time.  Marland Kitchen

## 2015-02-04 ENCOUNTER — Ambulatory Visit (HOSPITAL_COMMUNITY)
Admission: RE | Admit: 2015-02-04 | Discharge: 2015-02-04 | Disposition: A | Discharge status: Home / Self Care | Payer: Medicare Other | Source: Ambulatory Visit | Attending: Family Medicine | Admitting: Family Medicine

## 2015-02-04 DIAGNOSIS — M810 Age-related osteoporosis without current pathological fracture: Secondary | ICD-10-CM | POA: Insufficient documentation

## 2015-02-04 MED ORDER — ZOLEDRONIC ACID 5 MG/100ML IV SOLN
INTRAVENOUS | Status: AC
  Filled 2015-02-04: qty 100

## 2015-02-04 MED ORDER — ZOLEDRONIC ACID 5 MG/100ML IV SOLN
5.0000 mg | Freq: Once | INTRAVENOUS | Status: AC
  Administered 2015-02-04: 5 mg via INTRAVENOUS

## 2015-02-08 ENCOUNTER — Encounter (HOSPITAL_COMMUNITY): Payer: Medicare Other

## 2015-02-09 DIAGNOSIS — H903 Sensorineural hearing loss, bilateral: Secondary | ICD-10-CM | POA: Diagnosis not present

## 2015-02-09 DIAGNOSIS — H1045 Other chronic allergic conjunctivitis: Secondary | ICD-10-CM | POA: Diagnosis not present

## 2015-02-10 ENCOUNTER — Encounter (HOSPITAL_COMMUNITY)
Admission: RE | Admit: 2015-02-10 | Discharge: 2015-02-10 | Disposition: A | Discharge status: Home / Self Care | Payer: Medicare Other | Source: Ambulatory Visit | Attending: Internal Medicine | Admitting: Internal Medicine

## 2015-02-10 NOTE — Progress Notes (Signed)
Patient arrived to pulmonary rehab today for MD Day, which is a question and answer session, he stayed for this.  Afterwards he realized he did not have his glucometer strips to check his CBG.  He refused me checking with our point of care meter due to the charge.  He did not exercise today and went home.  He was rolled via wheelchair to the [employer] to go home.  He was not charged for an exercise session.

## 2015-02-15 ENCOUNTER — Encounter (HOSPITAL_COMMUNITY)
Admission: RE | Admit: 2015-02-15 | Discharge: 2015-02-15 | Disposition: A | Discharge status: Home / Self Care | Payer: Medicare Other | Source: Ambulatory Visit | Attending: Internal Medicine | Admitting: Internal Medicine

## 2015-02-15 DIAGNOSIS — J439 Emphysema, unspecified: Secondary | ICD-10-CM | POA: Insufficient documentation

## 2015-02-15 NOTE — Progress Notes (Signed)
Today, Paul [name] exercised at [employer]. Service time was from 1330 to 1500.  The patient exercised by performing aerobic, strengthening, and stretching exercises. Oxygen saturation, heart rate, blood pressure, rate of perceived exertion, and shortness of breath were all monitored before, during, and after exercise. Paul [name] presented with no problems at today's exercise session.  The patient did  have an increase in workload intensity during today's exercise session.  Pre-exercise vitals: . Weight kg: 115.0 . Liters of O2: 3 . SpO2: 93 . HR: 86 . BP: 120/64 . CBG: 167  Exercise vitals: . Highest heartrate:  94 . Lowest oxygen saturation: 95 . Highest blood pressure: 110/52 . Liters of 02: 3  Post-exercise vitals: . SpO2: 96 . HR: 74 . BP: 126/60 . Liters of O2: 2 . CBG: 108 Dr. Brand Males, Medical Director Dr. Delena Serve is immediately available during today's Pulmonary Rehab session for Paul [name] on 02/15/2015 at 1330 class time.  Marland Kitchen

## 2015-02-17 ENCOUNTER — Encounter (HOSPITAL_COMMUNITY)
Admission: RE | Admit: 2015-02-17 | Discharge: 2015-02-17 | Disposition: A | Discharge status: Home / Self Care | Payer: Medicare Other | Source: Ambulatory Visit | Attending: Internal Medicine | Admitting: Internal Medicine

## 2015-02-17 DIAGNOSIS — J439 Emphysema, unspecified: Secondary | ICD-10-CM | POA: Diagnosis not present

## 2015-02-17 NOTE — Progress Notes (Signed)
Today, Paul [name] exercised at [employer]. Service time was from 1330 to 1530.  The patient exercised by performing aerobic, strengthening, and stretching exercises. Oxygen saturation, heart rate, blood pressure, rate of perceived exertion, and shortness of breath were all monitored before, during, and after exercise. Paul [name] presented with no problems at today's exercise session. Paul [name] also attended an education session on oxygen use and safety.  The patient did not have an increase in workload intensity during today's exercise session.  Pre-exercise vitals: . Weight kg: 116.1 . Liters of O2: 2L . SpO2: 93 . HR: 86 . BP: 122/60 . CBG: 226  Exercise vitals: . Highest heartrate:  98 . Lowest oxygen saturation: 91 . Highest blood pressure: 126/60 . Liters of 02: 2L  Post-exercise vitals: . SpO2: 95 . HR: 78 . BP: 112/60 . Liters of O2: 2L . CBG: 113  Dr. Brand Males, Medical Director Dr. Clementeen Graham is immediately available during today's Pulmonary Rehab session for Paul [name] on 02/17/2015 at 1330 class time.

## 2015-02-18 DIAGNOSIS — H1031 Unspecified acute conjunctivitis, right eye: Secondary | ICD-10-CM | POA: Diagnosis not present

## 2015-02-21 DIAGNOSIS — H15121 Nodular episcleritis, right eye: Secondary | ICD-10-CM | POA: Diagnosis not present

## 2015-02-22 ENCOUNTER — Telehealth: Payer: Self-pay | Admitting: Internal Medicine

## 2015-02-22 ENCOUNTER — Encounter (HOSPITAL_COMMUNITY)
Admission: RE | Admit: 2015-02-22 | Discharge: 2015-02-22 | Disposition: A | Discharge status: Home / Self Care | Payer: Medicare Other | Source: Ambulatory Visit | Attending: Internal Medicine | Admitting: Internal Medicine

## 2015-02-22 DIAGNOSIS — J439 Emphysema, unspecified: Secondary | ICD-10-CM | POA: Diagnosis not present

## 2015-02-22 DIAGNOSIS — J449 Chronic obstructive pulmonary disease, unspecified: Secondary | ICD-10-CM

## 2015-02-22 NOTE — Progress Notes (Signed)
Today, Paul [name] exercised at [employer]. Service time was from 1:30pm to 2:55pm.  The patient exercised by performing aerobic, strengthening, and stretching exercises. Oxygen saturation, heart rate, blood pressure, rate of perceived exertion, and shortness of breath were all monitored before, during, and after exercise. Paul [name] presented with no problems at today's exercise session.  The patient did have an increase in workload intensity during today's exercise session.  Pre-exercise vitals: . Weight kg: 114.8 . Liters of O2: 2 . SpO2: 94 . HR: 91 . BP: 120/60 . CBG: 115  Exercise vitals: . Highest heartrate:  94 . Lowest oxygen saturation: 95 . Highest blood pressure: 132/62 . Liters of 02: 2  Post-exercise vitals: . SpO2: 95 . HR: 87 . BP: 108/60 . Liters of O2: 2 . CBG: 147  Dr. Brand Males, Medical Director Dr. Clementeen Graham is immediately available during today's Pulmonary Rehab session for Paul [name] on 02/22/15 at 1:30pm class time.

## 2015-02-22 NOTE — Progress Notes (Signed)
I have reviewed a Home Exercise Prescription with [name] . Raynaldo is not currently exercising at home.  The patient was advised to walk 2-3 days a week for 30 minutes.  Foster and I discussed how to progress their exercise prescription.  The patient stated that their goals were to be able to lose 70 pounds.  The patient stated that other than that he did not have any other goals.  The patient stated that they understand the exercise prescription.  We reviewed exercise guidelines, target heart rate during exercise, oxygen use, weather, home pulse oximeter, endpoints for exercise, and goals.  Patient is encouraged to come to me with any questions. I will continue to follow up with the patient to assist them with progression and safety.

## 2015-02-22 NOTE — Telephone Encounter (Signed)
Spoke with [facility] with Pulmonary Rehab  She states that she is needing order for o2 for the pt  He has been using the o2 while at rehab 2lpm with rest and 3lpm with exertion  She states that he has refused for [place] to order o2 before, but wants it now so he can go walking with his spouse  He has upcoming appt with rehab on 02/24/15 and she will get the qualifying sats if you are okay with ordering o2  Pt last seen 07/14/14 and advised to f/u PRN  Please advise, thanks!

## 2015-02-23 NOTE — Telephone Encounter (Signed)
Fine with me

## 2015-02-23 NOTE — Telephone Encounter (Signed)
Called to speak with Paul [name], was told that Paul [name] is not working this morning and that she will call me back when she returns to office tomorrow.

## 2015-02-24 ENCOUNTER — Telehealth: Payer: Self-pay | Admitting: Internal Medicine

## 2015-02-24 ENCOUNTER — Telehealth (HOSPITAL_COMMUNITY): Payer: Self-pay | Admitting: *Deleted

## 2015-02-24 ENCOUNTER — Encounter (HOSPITAL_COMMUNITY): Admission: RE | Admit: 2015-02-24 | Payer: Medicare Other | Source: Ambulatory Visit

## 2015-02-24 DIAGNOSIS — H15121 Nodular episcleritis, right eye: Secondary | ICD-10-CM | POA: Diagnosis not present

## 2015-02-24 NOTE — Telephone Encounter (Signed)
Paul [name] from pulm rehab returning call.Paul [name]

## 2015-02-24 NOTE — Telephone Encounter (Signed)
Spoke with pt, states he did not go to pulm rehab d/t a sinus infection.   Pt states he was told by pulmonary rehab that he would only need a poc, but according to [place] at [name] rehab he was using 02 with rest and with exertion.  Pt is not wanting a home system, just a poc.  I advised pt that if he is needing 02 at rest he will need a home system.   Pt will be having his walk test at pulmonary rehab next Tuesday.    Forwarding to [facility] to make aware, as an 02 order was placed at [facility] with Pulm rehab's recommendations

## 2015-02-24 NOTE — Telephone Encounter (Signed)
Spoke with Cloyde Reams at [facility], verified that pt uses 2lpm at rest and 3lpm with exertion.  States that pt is requesting a poc.   Qualifying sats will be obtained today at pulmonary rehab.   Order placed.  Nothing further needed.

## 2015-02-25 NOTE — Telephone Encounter (Signed)
Spoke with patient, states that he is scheduled for a walk test on Tuesday 03/01/15 at Pulmonary Rehab with [facility].  Will send back to [facility] as [place].

## 2015-02-25 NOTE — Telephone Encounter (Signed)
Order was placed and [facility] to [facility] but we never got his qualifying sats, this was to be done in rehab but pt did not go so we may have to do them Joellen Jersey

## 2015-03-01 ENCOUNTER — Encounter (HOSPITAL_COMMUNITY)
Admission: RE | Admit: 2015-03-01 | Discharge: 2015-03-01 | Disposition: A | Discharge status: Home / Self Care | Payer: Medicare Other | Source: Ambulatory Visit | Attending: Internal Medicine | Admitting: Internal Medicine

## 2015-03-01 ENCOUNTER — Telehealth: Payer: Self-pay | Admitting: Internal Medicine

## 2015-03-01 DIAGNOSIS — J439 Emphysema, unspecified: Secondary | ICD-10-CM | POA: Diagnosis not present

## 2015-03-01 DIAGNOSIS — R0902 Hypoxemia: Secondary | ICD-10-CM

## 2015-03-01 NOTE — Telephone Encounter (Signed)
Referral order and copy of 16mn walk has [facility] [employer].AVerdie Mosher

## 2015-03-01 NOTE — Progress Notes (Signed)
Paul [name] 74 y.o. male Nutrition Note Spoke with pt. Pt is obese. Pt stated he is interested in wt loss. Per discussion, pt is in the pre-contemplative state of change. There are several ways the pt can change his diet.  Pt's Rate Your Plate results reviewed with pt. Pt avoids most salty foods; does not use canned/ convenience food.  Pt rarely adds salt to food.  The role of sodium in lung disease reviewed with pt. Pt expressed understanding of the information reviewed. Pt is diabetic. Pt has not been checking his CBG's at home due to "they've been checked every day that I'm here." Pt previously checked his CBG's once weekly. Pre- and post-exercise CBG's mostly been within desired goal range. According to pt, his last A1c was 6.8. Pt expressed understanding of the information reviewed.  Nutrition Diagnosis ? Food-and nutrition-related knowledge deficit related to lack of exposure to information as related to diagnosis of pulmonary disease ? 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  Nutrition Rx/Est. Daily Nutrition Needs for: ? wt loss 1900 Kcal  85-95 gm protein   1500 mg or less sodium     250 gm CHO Nutrition Intervention ? Pt's individual nutrition plan and goals reviewed with pt. ? Benefits of adopting healthy eating habits discussed when pt's Rate Your Plate reviewed. ? Pt to attend the Nutrition and Lung Disease class ? Continual client-centered nutrition education by RD, as part of interdisciplinary care. Goal(s) 1. Identify food quantities necessary to achieve wt loss of  -2# per week at graduation from pulmonary rehab. 2. Describe the benefit of including fruits, vegetables, whole grains, and low-fat dairy products in a healthy meal plan. Monitor and Evaluate progress toward nutrition goal with team.   Derek Mound, M.Ed, RD, LDN, CDE 03/01/2015 3:41 PM

## 2015-03-01 NOTE — Telephone Encounter (Signed)
Received fax from 6 min walk.  Placed in [facility] box.

## 2015-03-01 NOTE — Progress Notes (Signed)
Today, [name] exercised at [employer]. Service time was from 1:30pm to 3:15pm.  The patient exercised by performing aerobic, strengthening, and stretching exercises. Oxygen saturation, heart rate, blood pressure, rate of perceived exertion, and shortness of breath were all monitored before, during, and after exercise. Paul [name] presented with no problems at today's exercise session.  The patient did not have an increase in workload intensity during today's exercise session.  Pre-exercise vitals: . Weight kg: 115.7 . Liters of O2: ra . SpO2: 92 . HR: 92 . BP: 120/50 . CBG: 147  Exercise vitals: . Highest heartrate:  103 . Lowest oxygen saturation: 90 . Highest blood pressure:  . Liters of 02: 2  Post-exercise vitals: . SpO2: 96 . HR: 84 . BP: 110/64 . Liters of O2: 2 . CBG: 105  Dr. Brand Males, Medical Director Dr. Wyline Copas is immediately available during today's Pulmonary Rehab session for [name] on 03/01/15 at 1:30pm class time.

## 2015-03-02 ENCOUNTER — Telehealth: Payer: Self-pay | Admitting: Internal Medicine

## 2015-03-02 DIAGNOSIS — H5711 Ocular pain, right eye: Secondary | ICD-10-CM | POA: Diagnosis not present

## 2015-03-02 NOTE — Addendum Note (Signed)
Addended by: Rosana Berger on: 03/02/2015 04:00 PM   Modules accepted: Orders

## 2015-03-02 NOTE — Telephone Encounter (Signed)
LMOM for the pt advising that he only needs o2 3lpm with exertion per MW  No o2 needed with rest  New order sent to [facility]

## 2015-03-02 NOTE — Telephone Encounter (Signed)
lmtcb for pt.  

## 2015-03-02 NOTE — Telephone Encounter (Signed)
Pt returning call.Paul [name] ° °

## 2015-03-02 NOTE — Telephone Encounter (Signed)
Pt returned call to nurse [phone]

## 2015-03-02 NOTE — Telephone Encounter (Signed)
Patient would like Dr. Melvyn Novas to review his 6 min walk test; he doesn't think the order is right, he says that he does not need oxygen while sitting, only while ambulating.  Patient says that order sent to [facility] states that he uses O2 at rest.  To Magda Paganini to follow up.

## 2015-03-02 NOTE — Telephone Encounter (Signed)
Called and spoke to pt. Informed pt of the appt needed. Appt made with TP on 4/25. Pt verbalized understanding and denied any further questions or concerns at this time.   Called and informed Melissa of OV. Melissa verbalized understanding and denied any further questions or concerns at this time.

## 2015-03-02 NOTE — Telephone Encounter (Signed)
Spoke to pt order cancelled with [facility] and was given to Corinne

## 2015-03-03 ENCOUNTER — Encounter (HOSPITAL_COMMUNITY)
Admission: RE | Admit: 2015-03-03 | Discharge: 2015-03-03 | Disposition: A | Discharge status: Home / Self Care | Payer: Medicare Other | Source: Ambulatory Visit | Attending: Internal Medicine | Admitting: Internal Medicine

## 2015-03-03 ENCOUNTER — Other Ambulatory Visit: Payer: Self-pay | Admitting: Family Medicine

## 2015-03-03 DIAGNOSIS — R51 Headache: Secondary | ICD-10-CM

## 2015-03-03 DIAGNOSIS — R519 Headache, unspecified: Secondary | ICD-10-CM

## 2015-03-03 DIAGNOSIS — J439 Emphysema, unspecified: Secondary | ICD-10-CM | POA: Diagnosis not present

## 2015-03-03 DIAGNOSIS — G8929 Other chronic pain: Secondary | ICD-10-CM

## 2015-03-03 DIAGNOSIS — H5711 Ocular pain, right eye: Secondary | ICD-10-CM

## 2015-03-03 NOTE — Progress Notes (Signed)
Today, Paul [name] exercised at [employer]. Service time was from 1330 to 1530.  The patient exercised by performing aerobic, strengthening, and stretching exercises. Oxygen saturation, heart rate, blood pressure, rate of perceived exertion, and shortness of breath were all monitored before, during, and after exercise. [name] presented with no problems at today's exercise session.  Patient attended the [employer] class lecture regarding home oxygen services.  The patient did not have an increase in workload intensity during today's exercise session.  Pre-exercise vitals: . Weight kg: 116.4 . Liters of O2: RA . SpO2: 92 . HR: 85 . BP: 120/ . CBG: 150  Exercise vitals: . Highest heartrate:  83 . Lowest oxygen saturation: 91 . Highest blood pressure: 150/84 . Liters of 02: ra  Post-exercise vitals: . SpO2: 96 . HR: 74 . BP: 114/60 . Liters of O2: 2 . CBG: 104 Dr. Brand Males, Medical Director Dr. Sheran Fava is immediately available during today's Pulmonary Rehab session for Paul [name] on 03/03/2015 at 1330 class time.  Marland Kitchen

## 2015-03-04 ENCOUNTER — Ambulatory Visit
Admission: RE | Admit: 2015-03-04 | Discharge: 2015-03-04 | Disposition: A | Discharge status: Home / Self Care | Payer: Medicare Other | Source: Ambulatory Visit | Attending: Family Medicine | Admitting: Family Medicine

## 2015-03-04 DIAGNOSIS — G8929 Other chronic pain: Secondary | ICD-10-CM

## 2015-03-04 DIAGNOSIS — H5711 Ocular pain, right eye: Secondary | ICD-10-CM

## 2015-03-04 DIAGNOSIS — R519 Headache, unspecified: Secondary | ICD-10-CM

## 2015-03-04 DIAGNOSIS — R51 Headache: Secondary | ICD-10-CM | POA: Diagnosis not present

## 2015-03-04 MED ORDER — IOHEXOL 300 MG/ML  SOLN
75.0000 mL | Freq: Once | INTRAMUSCULAR | Status: AC | PRN
  Administered 2015-03-04: 75 mL via INTRAVENOUS

## 2015-03-07 ENCOUNTER — Ambulatory Visit (INDEPENDENT_AMBULATORY_CARE_PROVIDER_SITE_OTHER): Payer: Medicare Other | Admitting: Adult Health

## 2015-03-07 ENCOUNTER — Encounter: Payer: Self-pay | Admitting: Adult Health

## 2015-03-07 VITALS — BP 128/68 | HR 79 | Ht 66.5 in | Wt 257.0 lb

## 2015-03-07 DIAGNOSIS — J9620 Acute and chronic respiratory failure, unspecified whether with hypoxia or hypercapnia: Secondary | ICD-10-CM | POA: Insufficient documentation

## 2015-03-07 DIAGNOSIS — J449 Chronic obstructive pulmonary disease, unspecified: Secondary | ICD-10-CM

## 2015-03-07 DIAGNOSIS — J9611 Chronic respiratory failure with hypoxia: Secondary | ICD-10-CM | POA: Diagnosis not present

## 2015-03-07 DIAGNOSIS — J9610 Chronic respiratory failure, unspecified whether with hypoxia or hypercapnia: Secondary | ICD-10-CM | POA: Insufficient documentation

## 2015-03-07 NOTE — Progress Notes (Signed)
Subjective:    Patient ID: Paul [name], male    DOB: 02-Apr-1941  MRN: [id number]    Brief patient profile:  15 yowm smoker with dx of copd with worse symptoms since 2013 referred to pulmonary clinic 12/24/2012 by Dr Darcus Austin for COPD evaluation with GOLD II criteria May 2014    History of Present Illness  12/24/2012 1st pulmonary eval  On advair/ spiriva longterm with freq use of proventil prior to dx  PE presenting as pleurtic L CP May 2013 with "moderate clot burden" to  [facility] with PAF and started on Xarelto and baseline doe x climbing steps but could walk flat all day long before and after PE but then indolent onset doe progressively worse x 9 months assoc with cough congestion > green mucus better p on levaquin x one week but convinced his coughing and breathing problems are all due to xarelto because of what he read on the package. rec Only use your albuterol (proaire) as a rescue medication (plan B)  to be used if you can't catch your breath   The key is to stop smoking completely before smoking completely stops you     02/05/2013 f/u ov/Wert cc need less albuterol maint on advair and spiriva with worse cough and sob x mailbox and back and collapse in chair.   Mucus turned green again x 2 weeks prior to OV   rec Stop advair and lisinopril Start symbicort 160 Take 2 puffs first thing in am and then another 2 puffs about 12 hours later.  Only use your albuterol(proaire)  as a rescue medication to be used if you can't catch your breath by resting or doing a relaxed purse lip breathing pattern. The less you use it, the better it will work when you need it.  Prednisone 10 mg take  4 each am x 2 days,   2 each am x 2 days,  1 each am x2days and stop  Levaquin 750 x 5 days For cough mucinex dm best as needed  03/26/2013 f/u ov/Wert re copd Chief Complaint  Patient presents with  . Follow-up    Breathing is unchanged since the last visit. Ran out of symbicort so started back on  advair about 2 wks ago. He could not tell difference between the 2 meds, but spouse noted he did not cough as much on symbicort.  mucus is more day than night, usually not green rec Ok to stop spiriva Work on Engineer, technical sales technique:      06/21/2014 f/u ov/Wert re: GOLD II COPD / still smoking, concerned re side effects of meds  Chief Complaint  Patient presents with  . Follow-up    Pt states had six minute walk test which was abnormal. He states that he was referred here to discuss this per Dr Darcus Austin. Pt was last seen here in May 2014 and states that his breathing is unchanged since then.     lots of phlegm and congestion each am and occ wakes at 3 am but mostly daytime. 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 Follow-up    Pt states that his cough and SOB are unchanged since the last visit. No new co's today.    no proair  Day of ov/ pacing himself and no longer doing steps so less sob/ less saba need - rattling cough esp in am, thick, slt green, not on max dose mucinex but seems to helps whereas even levaquin did not >> no changes , declined O2 .   03/07/2015 Follow up COPD /still smoking  Patient returns for a six-month follow-up. He says overall he has been doing okay He is currently a pulmonary rehabilitation and feels that it is really helping him. His O2 saturations do drop with exercise and he uses oxygen at pulmonary rehabilitation Pulmonary rehab , he wears O2 with exercise and does much better.  O2 sat at rest are 95% walking. Today in office with drop with O2 saturation 88% on room air. Patient  denies any chest pain, palpitations, orthopnea, PND, or increased leg swelling. He does request that his oxygen orders be sent to [facility]  and is looking at the  portable concentrator unit-[employer].  He continues to smoke. We discussed cessation. He says he has no interest in quitting smoking. We discussed the dangers of oxygen and smoking. Patient was noted to have oxygen desaturations. Last visit and declined oxygen but now says that he is rated begin oxygen.   Current Medications, Allergies, Past Medical History, Past Surgical History, Family History, and Social History were reviewed in [employer] record.  ROS  The following are not active complaints unless bolded sore throat, dysphagia, dental problems, itching, sneezing,   ear ache,   fever, chills, sweats, unintended wt loss, pleuritic or exertional cp, hemoptysis,  orthopnea pnd or leg swelling, presyncope, palpitations, heartburn, abdominal pain, anorexia, nausea, vomiting, diarrhea  or change in bowel or urinary habits, change in stools or urine, dysuria,hematuria,  rash, arthralgias, visual complaints, headache, numbness weakness or ataxia or problems with walking or coordination,  change in mood/affect or memory.          Objective:   Physical Exam  02/05/2013  Wt 266 > 03/26/2013  269 > 06/21/2014 261 > 08/04/2014  256 >257 03/07/2015   amb obese wm   HEENT mild turbinate edema.  Oropharynx no thrush or excess pnd or cobblestoning.  No JVD or cervical adenopathy. Mild accessory muscle hypertrophy. Trachea midline, nl thryroid. Chest was hyperinflated by percussion with diminished breath sounds and moderate increased exp time without wheeze. Hoover sign positive at mid inspiration. Kyphosis  Regular rate and rhythm without murmur gallop or rub or increase P2 or edema.  Abd: no hsm, nl excursion. Ext warm without cyanosis or clubbing.       CXR  06/21/2014 : Grossly stable postoperative chest. No acute process  or evidence of metastatic disease.   sinuc ct neg 06/25/14      Assessment & Plan:

## 2015-03-07 NOTE — Assessment & Plan Note (Signed)
Patient does desaturate with ambulation Begin oxygen at 2 L Order sent to DME for evaluation of portable concentrator  Will need ONO   Plan  Continue on current regimen  Work on not smoking  Begin Oxygen 2l/m with activity  Order sent to [place] for evaluation of portable concentrator .  Set up for ONO on room air .  follow up Dr. Melvyn Novas  In 6 months and As needed

## 2015-03-07 NOTE — Addendum Note (Signed)
Addended by: Parke Poisson E on: 03/07/2015 12:22 PM   Modules accepted: Orders

## 2015-03-07 NOTE — Assessment & Plan Note (Signed)
Compensated on present regimen  Plan  Continue on current regimen  Work on not smoking  Begin Oxygen 2l/m with activity  Order sent to [place] for evaluation of portable concentrator .  Set up for ONO on room air .  follow up Dr. Melvyn Novas  In 6 months and As needed

## 2015-03-07 NOTE — Patient Instructions (Signed)
Continue on current regimen  Work on not smoking  Begin Oxygen 2l/m with activity  Order sent to [place] for evaluation of portable concentrator .  Set up for ONO on room air .  follow up Dr. Melvyn Novas  In 6 months and As needed

## 2015-03-08 ENCOUNTER — Encounter (HOSPITAL_COMMUNITY)
Admission: RE | Admit: 2015-03-08 | Discharge: 2015-03-08 | Disposition: A | Discharge status: Home / Self Care | Payer: Medicare Other | Source: Ambulatory Visit | Attending: Internal Medicine | Admitting: Internal Medicine

## 2015-03-08 DIAGNOSIS — J439 Emphysema, unspecified: Secondary | ICD-10-CM | POA: Diagnosis not present

## 2015-03-08 NOTE — Progress Notes (Signed)
Today, Paul [name] exercised at [facility]. Service time was from 1330 to 1515.  The patient exercised by performing aerobic, strengthening, and stretching exercises. Oxygen saturation, heart rate, blood pressure, rate of perceived exertion, and shortness of breath were all monitored before, during, and after exercise. Deontay presented with no problems at today's exercise session.  The patient did not have an increase in workload intensity during today's exercise session.  Pre-exercise vitals: . Weight kg: 117.1 . Liters of O2: ra . SpO2: 91 . HR: 86 . BP: 128/60 . CBG: 230  Exercise vitals: . Highest heartrate:  92 . Lowest oxygen saturation: 94 . Highest blood pressure: 118/64 . Liters of 02: 2L  Post-exercise vitals: . SpO2: 95 . HR: 85 . BP: 114/58 . Liters of O2: 2L . CBG: 146  Dr. Brand Males, Medical Director Dr. Sheran Fava is immediately available during today's Pulmonary Rehab session for [name] on 03/08/2015 at 1330 class time.

## 2015-03-08 NOTE — Progress Notes (Signed)
Paul [name] 74 y.o. male Nutrition Note Spoke with pt. Pt wt is up 2.2 kg (4.8 lb) over the past 5 weeks. Pt reports his UBW was 262 lb prior to him starting to lose wt. Pt reports he was able to lose wt at home doing no exercise and eating 2 meals/d. Pt CBG's dropping 42-113 mg/dL with exercise. Pt has been holding Actos at breakfast on exercise days. Pt encouraged to take his Actos and try holding his Amaryl on exercise days due to significant decrease in pre-/post-exercise CBG's and pt having to eat "extra" in order to exercise.   Nutrition Diagnosis ? Food-and nutrition-related knowledge deficit related to lack of exposure to information as related to diagnosis of pulmonary disease ? 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  Nutrition Rx/Est. Daily Nutrition Needs for: ? wt loss 1900 Kcal  85-95 gm protein   1500 mg or less sodium     250 gm CHO Nutrition Intervention ? Pt's individual nutrition plan and goals reviewed with pt. ? Pt to try holding Amaryl and taking Actos in the morning on exercise days. ? Will notify PCP re: trial.  ? Pt to attend the Nutrition and Lung Disease class ? Continual client-centered nutrition education by RD, as part of interdisciplinary care. Goal(s) 1. Identify food quantities necessary to achieve wt loss of  -2# per week at graduation from pulmonary rehab. 2. Describe the benefit of including fruits, vegetables, whole grains, and low-fat dairy products in a healthy meal plan. Monitor and Evaluate progress toward nutrition goal with team.   Derek Mound, M.Ed, RD, LDN, CDE 03/08/2015 2:57 PM

## 2015-03-09 NOTE — Progress Notes (Signed)
Chart/ ov note reviewed >> agree with a/p

## 2015-03-10 ENCOUNTER — Encounter (HOSPITAL_COMMUNITY)
Admission: RE | Admit: 2015-03-10 | Discharge: 2015-03-10 | Disposition: A | Discharge status: Home / Self Care | Payer: Medicare Other | Source: Ambulatory Visit | Attending: Internal Medicine | Admitting: Internal Medicine

## 2015-03-10 DIAGNOSIS — J439 Emphysema, unspecified: Secondary | ICD-10-CM | POA: Diagnosis not present

## 2015-03-10 NOTE — Progress Notes (Signed)
Today, [name] exercised at [facility]. Service time was from 1330 to 1515.  The patient exercised by performing aerobic, strengthening, and stretching exercises. Oxygen saturation, heart rate, blood pressure, rate of perceived exertion, and shortness of breath were all monitored before, during, and after exercise. Paul [name] presented with no problems at today's exercise session.Patient attended the Exercise for Pulmonary patient class today.    The patient did  have an increase in workload intensity during today's exercise session.  Pre-exercise vitals: . Weight kg: 116.9 . Liters of O2: 1L . SpO2: 93 . HR: 79 . BP: 124/60 . CBG: 163  Exercise vitals: . Highest heartrate:  88 . Lowest oxygen saturation: 93 . Highest blood pressure: 122/72 . Liters of 02: 2L  Post-exercise vitals: . SpO2: 95 . HR: 72 . BP: 94/50 drank water recheck 100/54 . Liters of O2: 2 . CBG: 137 Dr. Brand Males, Medical Director Dr. Erlinda Hong is immediately available during today's Pulmonary Rehab session for [name] on 03/10/2015  at 1330 class time.  Marland Kitchen

## 2015-03-14 DIAGNOSIS — J449 Chronic obstructive pulmonary disease, unspecified: Secondary | ICD-10-CM | POA: Diagnosis not present

## 2015-03-15 ENCOUNTER — Encounter (HOSPITAL_COMMUNITY)
Admission: RE | Admit: 2015-03-15 | Discharge: 2015-03-15 | Disposition: A | Discharge status: Home / Self Care | Payer: Medicare Other | Source: Ambulatory Visit | Attending: Internal Medicine | Admitting: Internal Medicine

## 2015-03-15 DIAGNOSIS — J439 Emphysema, unspecified: Secondary | ICD-10-CM | POA: Insufficient documentation

## 2015-03-15 NOTE — Progress Notes (Signed)
Today, [name] exercised at [facility]. Service time was from 1330 to 1500.  The patient exercised by performing aerobic, strengthening, and stretching exercises. Oxygen saturation, heart rate, blood pressure, rate of perceived exertion, and shortness of breath were all monitored before, during, and after exercise. [name] presented with no problems at today's exercise session.  The patient did have an increase in workload intensity during today's exercise session.  Pre-exercise vitals: . Weight kg: 115.9 . Liters of O2: 2 . SpO2: 95 . HR: 95 . BP: 120/62 . CBG: 212  Exercise vitals: . Highest heartrate:  107 . Lowest oxygen saturation: 92 . Highest blood pressure: 128/60 . Liters of 02: 2 .   Post-exercise vitals: . SpO2: 93 . HR: 71 . BP: 106/52 . Liters of O2: 2 Dr. Brand Males, Medical Director Dr. Algis Liming is immediately available during today's Pulmonary Rehab session for Paul [name] on 03/15/2015 at 1330 class time.  . CBG: 148

## 2015-03-16 ENCOUNTER — Telehealth: Payer: Self-pay | Admitting: Internal Medicine

## 2015-03-16 DIAGNOSIS — J449 Chronic obstructive pulmonary disease, unspecified: Secondary | ICD-10-CM

## 2015-03-16 NOTE — Telephone Encounter (Signed)
Pt calling for ONO results. Paul [name] has this been received? thanks

## 2015-03-16 NOTE — Telephone Encounter (Signed)
I spoke with patient about results and he verbalized understanding and had no questions 

## 2015-03-16 NOTE — Telephone Encounter (Signed)
Yes, ONO on room air results received  Reviewed by TP: begin oxygen at bedtime, 2lpm Order placed to [employer]

## 2015-03-16 NOTE — Telephone Encounter (Signed)
Spoke with pt. He reports he was told by [facility] told him they only have order for O2 tanks. He thought he was getting a POC. I advised pt he would have to be evaluated for that. He reports that is not what [facility] is telling him.  I advised I would call [facility] and see what is going on.  Called [facility] and Arbie Cookey is gone fore the day. I have left a message for her to call back tomorrow when she returns. Called and made pt aware of this.

## 2015-03-17 ENCOUNTER — Encounter (HOSPITAL_COMMUNITY)
Admission: RE | Admit: 2015-03-17 | Discharge: 2015-03-17 | Disposition: A | Discharge status: Home / Self Care | Payer: Medicare Other | Source: Ambulatory Visit | Attending: Internal Medicine | Admitting: Internal Medicine

## 2015-03-17 DIAGNOSIS — J439 Emphysema, unspecified: Secondary | ICD-10-CM | POA: Diagnosis not present

## 2015-03-17 NOTE — Telephone Encounter (Signed)
Arbie Cookey still NA. lmtcb but will also send to PCC's to help as well

## 2015-03-17 NOTE — Telephone Encounter (Signed)
CMN signed by TP and given back to [facility]

## 2015-03-17 NOTE — Telephone Encounter (Addendum)
Spoke to Paul [name] at [place]. She says that she will fax a form that [name], NP just needs to sign and fax back and they will get him his POC.  Patient has been notified that we are awaiting the form.    To Jess for follow up

## 2015-03-17 NOTE — Progress Notes (Signed)
Today, Paul [name] exercised at [facility]. Service time was from 1330 to 1530.  The patient exercised by performing aerobic, strengthening, and stretching exercises. Oxygen saturation, heart rate, blood pressure, rate of perceived exertion, and shortness of breath were all monitored before, during, and after exercise. Paul [name] presented with no problems at today's exercise session. He attended class on warning signs and symptoms.  The patient did not have an increase in workload intensity during today's exercise session.  Pre-exercise vitals: . Weight kg: 116.8 . Liters of O2: 2 . SpO2: 95 . HR: 82 . BP: 130/58 . CBG: 220  Exercise vitals: . Highest heartrate:  91 . Lowest oxygen saturation: 94 . Highest blood pressure: 128/60 . Liters of 02: 2  Post-exercise vitals: . SpO2: 95 . HR: 84 . BP: 96/44 drank water rechecked BP 100/60. Pt denies any dizziness . Liters of O2: 2 . CBG: 139 Dr. Brand Males, Medical Director Dr. Algis Liming is immediately available during today's Pulmonary Rehab session for Paul [name] on 03/17/2015  at 1330 class time.  Marland Kitchen  Marland Kitchen

## 2015-03-21 ENCOUNTER — Telehealth: Payer: Self-pay | Admitting: Adult Health

## 2015-03-21 NOTE — Telephone Encounter (Signed)
[facility] - has this been taken care of?  Please advise patient, thanks.

## 2015-03-22 ENCOUNTER — Encounter (HOSPITAL_COMMUNITY)
Admission: RE | Admit: 2015-03-22 | Discharge: 2015-03-22 | Disposition: A | Discharge status: Home / Self Care | Payer: Medicare Other | Source: Ambulatory Visit | Attending: Internal Medicine | Admitting: Internal Medicine

## 2015-03-22 DIAGNOSIS — J439 Emphysema, unspecified: Secondary | ICD-10-CM | POA: Diagnosis not present

## 2015-03-22 NOTE — Progress Notes (Addendum)
Today, Paul [name] exercised at [facility]. Paul [name] Paul [name]. Service time was from 1330 to 1535.  The patient exercised by performing aerobic, strengthening, and stretching exercises. Oxygen saturation, heart rate, blood pressure, rate of perceived exertion, and shortness of breath were all monitored before, during, and after exercise. Paul [name] presented with no problems at today's exercise session. Paul [name] also attended an education on anatomy and physiology of the respiratory system and intimacy.  The patient did have an increase in workload intensity during today's exercise session.  Pre-exercise vitals: . Weight kg: 117.4 . Liters of O2: 2L . SpO2: 96 . HR: 86 . BP: 120/44 . CBG: 133  Exercise vitals: . Highest heartrate:  82 . Lowest oxygen saturation: 95 . Highest blood pressure: 148/64 . Liters of 02: 2L  Post-exercise vitals: . SpO2: 96 . HR: 76 . BP: 112/70 . Liters of O2: ra . CBG: 111  Dr. Brand Males, Medical Director Dr. Algis Liming is immediately available during today's Paul [name] session for Paul [name] on 03/22/2015 at 1330 class time.

## 2015-03-22 NOTE — Telephone Encounter (Signed)
Spoke to carol_[email or web address]  she will fax the correct paper that Tammy Parrett needs to sign and we will have her sign it and fax it back asap pt is aware of this Joellen Jersey

## 2015-03-24 ENCOUNTER — Encounter (HOSPITAL_COMMUNITY)
Admission: RE | Admit: 2015-03-24 | Discharge: 2015-03-24 | Disposition: A | Discharge status: Home / Self Care | Payer: Medicare Other | Source: Ambulatory Visit | Attending: Internal Medicine | Admitting: Internal Medicine

## 2015-03-24 DIAGNOSIS — J439 Emphysema, unspecified: Secondary | ICD-10-CM | POA: Diagnosis not present

## 2015-03-24 NOTE — Progress Notes (Signed)
Today, Paul [name] exercised at [facility]. Service time was from 1330 to 1530.  The patient exercised by performing aerobic, strengthening, and stretching exercises. Oxygen saturation, heart rate, blood pressure, rate of perceived exertion, and shortness of breath were all monitored before, during, and after exercise. Paul [name] presented with no problems at today's exercise session. Patient attended the Purse-lip and Diaphragmatic Breathing class today.  The patient did not have an increase in workload intensity during today's exercise session.  Pre-exercise vitals: . Weight kg: 117.6 . Liters of O2: 2 . SpO2: 96 . HR: 89 . BP: 130/54 . CBG: 160  Exercise vitals: . Highest heartrate:  97 . Lowest oxygen saturation: 91 . Highest blood pressure: 140/60 . Liters of 02: 2  Post-exercise vitals: . SpO2: 95 . HR: 75 . BP: 122/70 . Liters of O2: ra . CBG: 116 Dr. Brand Males, Medical Director Dr. Wendee Beavers is immediately available during today's Pulmonary Rehab session for Paul [name] on 03/24/2015  at 1330 class time.  Marland Kitchen

## 2015-03-29 ENCOUNTER — Encounter (HOSPITAL_COMMUNITY): Payer: Medicare Other

## 2015-03-31 ENCOUNTER — Encounter (HOSPITAL_COMMUNITY)
Admission: RE | Admit: 2015-03-31 | Discharge: 2015-03-31 | Disposition: A | Discharge status: Home / Self Care | Payer: Medicare Other | Source: Ambulatory Visit | Attending: Internal Medicine | Admitting: Internal Medicine

## 2015-03-31 DIAGNOSIS — J439 Emphysema, unspecified: Secondary | ICD-10-CM | POA: Diagnosis not present

## 2015-03-31 NOTE — Progress Notes (Signed)
Today, Paul [name] exercised at [facility]. Service time was from 1330 to 1530.  The patient exercised by performing aerobic, strengthening, and stretching exercises. Oxygen saturation, heart rate, blood pressure, rate of perceived exertion, and shortness of breath were all monitored before, during, and after exercise. [name] presented with no problems at today's exercise session. Patient attended the stress and energy conservation class today.     The patient did not have an increase in workload intensity during today's exercise session.  Pre-exercise vitals: . Weight kg: 116.8 . Liters of O2: 2 . SpO2: 96 . HR: 90 . BP: 110/54 . CBG: 200  Exercise vitals: . Highest heartrate:  104 . Lowest oxygen saturation: 93 . Highest blood pressure: 142/62 . Liters of 02: 2  Post-exercise vitals: . SpO2: 95 . HR: 73 . BP: 96/50 . Liters of O2: RA . CBG: 125 Dr. Brand Males, Medical Director Dr. Ree Kida is immediately available during today's Pulmonary Rehab session for [name] on 03/31/2015  at 1330 class time.  Marland Kitchen

## 2015-04-05 ENCOUNTER — Encounter (HOSPITAL_COMMUNITY)
Admission: RE | Admit: 2015-04-05 | Discharge: 2015-04-05 | Disposition: A | Discharge status: Home / Self Care | Payer: Medicare Other | Source: Ambulatory Visit | Attending: Internal Medicine | Admitting: Internal Medicine

## 2015-04-05 DIAGNOSIS — J439 Emphysema, unspecified: Secondary | ICD-10-CM | POA: Diagnosis not present

## 2015-04-05 NOTE — Progress Notes (Signed)
Today, Remmington exercised at [facility]. Service time was from 1330 to 1515.  The patient exercised by performing aerobic, strengthening, and stretching exercises. Oxygen saturation, heart rate, blood pressure, rate of perceived exertion, and shortness of breath were all monitored before, during, and after exercise. Bear presented with no problems at today's exercise session.  The patient did not have an increase in workload intensity during today's exercise session.  Pre-exercise vitals: . Weight kg: 116.9 . Liters of O2: 2 . SpO2: 94 . HR: 96 . BP: 130/64 . CBG:191   Exercise vitals: . Highest heartrate:  102 . Lowest oxygen saturation: 91 . Highest blood pressure: 116/54 . Liters of 02: 2  Post-exercise vitals: . SpO2: 95 . HR: 91 . BP: 104/56 . Liters of O2: 2 . CBG: 119 Dr. Brand Males, Medical Director Dr. Ree Kida is immediately available during today's Pulmonary Rehab session for [name] on 04/05/2015  at 1330 class time.  Marland Kitchen  Marland Kitchen

## 2015-04-07 ENCOUNTER — Encounter (HOSPITAL_COMMUNITY): Admission: RE | Admit: 2015-04-07 | Payer: Medicare Other | Source: Ambulatory Visit

## 2015-04-12 ENCOUNTER — Encounter (HOSPITAL_COMMUNITY)
Admission: RE | Admit: 2015-04-12 | Discharge: 2015-04-12 | Disposition: A | Discharge status: Home / Self Care | Payer: Medicare Other | Source: Ambulatory Visit | Attending: Internal Medicine | Admitting: Internal Medicine

## 2015-04-12 DIAGNOSIS — J439 Emphysema, unspecified: Secondary | ICD-10-CM | POA: Diagnosis not present

## 2015-04-12 NOTE — Progress Notes (Signed)
Today, Paul [name] exercised at [facility]. Service time was from 1330 to 1500.  The patient exercised by performing aerobic, strengthening, and stretching exercises. Oxygen saturation, heart rate, blood pressure, rate of perceived exertion, and shortness of breath were all monitored before, during, and after exercise. Paul [name] presented with no problems at today's exercise session.  The patient did not have an increase in workload intensity during today's exercise session.  Pre-exercise vitals: . Weight kg: 116.1 . Liters of O2: 2 . SpO2: 95 . HR: 87 . BP: 100/54 . CBG: 177  Exercise vitals: . Highest heartrate:  94 . Lowest oxygen saturation: 95 . Highest blood pressure: 114/60 . Liters of 02: 2  Post-exercise vitals: . SpO2: 96 . HR: 80 . BP: 116/50 . Liters of O2: 2 . CBG: 98 Dr. Brand Males, Medical Director Dr. Verlon Au is immediately available during today's Pulmonary Rehab session for Paul [name] on 04/12/2015 1330 at  class time.

## 2015-04-14 ENCOUNTER — Encounter (HOSPITAL_COMMUNITY)
Admission: RE | Admit: 2015-04-14 | Discharge: 2015-04-14 | Disposition: A | Discharge status: Home / Self Care | Payer: Medicare Other | Source: Ambulatory Visit | Attending: Internal Medicine | Admitting: Internal Medicine

## 2015-04-14 DIAGNOSIS — J439 Emphysema, unspecified: Secondary | ICD-10-CM | POA: Insufficient documentation

## 2015-04-14 NOTE — Progress Notes (Signed)
Today, Medard exercised at [facility]. Service time was from 1330 to 1545.  The patient exercised by performing aerobic, strengthening, and stretching exercises. Oxygen saturation, heart rate, blood pressure, rate of perceived exertion, and shortness of breath were all monitored before, during, and after exercise. Nijel presented with no problems at today's exercise session.  The patient did  have an increase in workload intensity during today's exercise session.  Pre-exercise vitals: . Weight kg: 116.5 . Liters of O2: 2 . SpO2: 93 . HR: 91 . BP: 14/50 . CBG: 264  Exercise vitals: . Highest heartrate:  91 . Lowest oxygen saturation: 95 . Highest blood pressure: 140/78 . Liters of 02: 2  Post-exercise vitals: . SpO2: 96 . HR: 78 . BP: 118/60 . Liters of O2: 2 . CBG: 118 Dr. Brand Males, Medical Director Dr. Grandville Silos is immediately available during today's Pulmonary Rehab session for [name] on 04/14/2015  at 1330 class time.  Marland Kitchen

## 2015-04-19 ENCOUNTER — Encounter (HOSPITAL_COMMUNITY)
Admission: RE | Admit: 2015-04-19 | Discharge: 2015-04-19 | Disposition: A | Discharge status: Home / Self Care | Payer: Medicare Other | Source: Ambulatory Visit | Attending: Internal Medicine | Admitting: Internal Medicine

## 2015-04-19 DIAGNOSIS — J439 Emphysema, unspecified: Secondary | ICD-10-CM | POA: Diagnosis not present

## 2015-04-19 NOTE — Progress Notes (Signed)
Today, Miguelangel exercised at [facility]. Service time was from 1330 to 1500.  The patient exercised by performing aerobic, strengthening, and stretching exercises. Oxygen saturation, heart rate, blood pressure, rate of perceived exertion, and shortness of breath were all monitored before, during, and after exercise. Paul [name] presented with no problems at today's exercise session.  He voices that his peripheral neuropathy seems worse in the last 2 weeks and was not sure how he would tolerate the exercise session today, but he did well.    The patient did not have an increase in workload intensity during today's exercise session.  Pre-exercise vitals: . Weight kg: 116.0 . Liters of O2: 2 . SpO2: 93 . HR: 82 . BP: 122/80 . CBG: 204  Exercise vitals: . Highest heartrate:  97 . Lowest oxygen saturation: 90 . Highest blood pressure: 146/70 . Liters of 02: 3  Post-exercise vitals: . SpO2: 97 . HR: 77 . BP: 108/68 . Liters of O2: 2 . CBG: 124 Dr. Brand Males, Medical Director Dr. Grandville Silos is immediately available during today's Pulmonary Rehab session for [name] on 04/19/2015  at 1330 class time.  Marland Kitchen

## 2015-04-21 ENCOUNTER — Encounter (HOSPITAL_COMMUNITY)
Admission: RE | Admit: 2015-04-21 | Discharge: 2015-04-21 | Disposition: A | Discharge status: Home / Self Care | Payer: Medicare Other | Source: Ambulatory Visit | Attending: Internal Medicine | Admitting: Internal Medicine

## 2015-04-21 DIAGNOSIS — J439 Emphysema, unspecified: Secondary | ICD-10-CM | POA: Diagnosis not present

## 2015-04-21 NOTE — Progress Notes (Signed)
Today, Malone exercised at [facility]. Service time was from 1330 to 1515.  The patient exercised by performing aerobic, strengthening, and stretching exercises. Oxygen saturation, heart rate, blood pressure, rate of perceived exertion, and shortness of breath were all monitored before, during, and after exercise. Rodgerick presented with no problems at today's exercise session.He attended pursed lip education class with the respiratory therapist.  The patient did have an increase in workload intensity during today's exercise session.  Pre-exercise vitals: . Weight kg: 116.0 . Liters of O2: 2 . SpO2: 95 . HR: 94 . BP: 110/50 . CBG: 151  Exercise vitals: . Highest heartrate:  96 . Lowest oxygen saturation: 91 . Highest blood pressure: 122/64 . Liters of 02: 2  Post-exercise vitals: . SpO2: 97 . HR: 83 . BP: 110/60 . Liters of O2: 2 . CBG: 93 Dr. Brand Males, Medical Director Dr. Hartford Poli is immediately available during today's Pulmonary Rehab session for [name] on 04/21/2015  at 1330 class time.  Marland Kitchen

## 2015-04-26 ENCOUNTER — Encounter (HOSPITAL_COMMUNITY)
Admission: RE | Admit: 2015-04-26 | Discharge: 2015-04-26 | Disposition: A | Discharge status: Home / Self Care | Payer: Medicare Other | Source: Ambulatory Visit | Attending: Internal Medicine | Admitting: Internal Medicine

## 2015-04-26 DIAGNOSIS — J439 Emphysema, unspecified: Secondary | ICD-10-CM | POA: Diagnosis not present

## 2015-04-26 NOTE — Progress Notes (Signed)
Today, [name] exercised at [facility]. Service time was from 1330 to 1515.  The patient exercised by performing aerobic, strengthening, and stretching exercises. Oxygen saturation, heart rate, blood pressure, rate of perceived exertion, and shortness of breath were all monitored before, during, and after exercise. [name] presented with no problems at today's exercise session.  The patient did not have an increase in workload intensity during today's exercise session.  Pre-exercise vitals: . Weight kg: 116.7 . Liters of O2: 2 . SpO2: 96 . HR: 82 . BP: 140/60 . CBG: 176  Exercise vitals: . Highest heartrate:  103 . Lowest oxygen saturation: 91 . Highest blood pressure: 140/60 . Liters of 02: 2  Post-exercise vitals: . SpO2: 97 . HR: 81 . BP: 106/60 . Liters of O2: 2 . CBG: 128 Dr. Brand Males, Medical Director Dr. Hartford Poli is immediately available during today's Pulmonary Rehab session for Paul [name] on 04/26/2015  at 1330 class time.  Marland Kitchen  Marland Kitchen

## 2015-04-28 ENCOUNTER — Ambulatory Visit (INDEPENDENT_AMBULATORY_CARE_PROVIDER_SITE_OTHER): Payer: Medicare Other | Admitting: Podiatry

## 2015-04-28 ENCOUNTER — Encounter: Payer: Self-pay | Admitting: Podiatry

## 2015-04-28 ENCOUNTER — Encounter (HOSPITAL_COMMUNITY)
Admission: RE | Admit: 2015-04-28 | Discharge: 2015-04-28 | Disposition: A | Discharge status: Home / Self Care | Payer: Medicare Other | Source: Ambulatory Visit | Attending: Internal Medicine | Admitting: Internal Medicine

## 2015-04-28 DIAGNOSIS — E1140 Type 2 diabetes mellitus with diabetic neuropathy, unspecified: Secondary | ICD-10-CM | POA: Diagnosis not present

## 2015-04-28 DIAGNOSIS — B351 Tinea unguium: Secondary | ICD-10-CM

## 2015-04-28 DIAGNOSIS — M79672 Pain in left foot: Secondary | ICD-10-CM | POA: Diagnosis not present

## 2015-04-28 DIAGNOSIS — J439 Emphysema, unspecified: Secondary | ICD-10-CM | POA: Diagnosis not present

## 2015-04-28 DIAGNOSIS — M79609 Pain in unspecified limb: Secondary | ICD-10-CM

## 2015-04-28 NOTE — Progress Notes (Signed)
Patient ID: Paul [name], male   DOB: December 23, 1940, 74 y.o.   MRN: [id number] Complaint:  Visit Type: Patient returns to my office for continued preventative foot care services. Complaint: Patient states" my nails have grown long and thick and become painful to walk and wear shoes" Patient has been diagnosed with DM with no complications. He presents for preventative foot care services. No changes to ROS.  He has not been treated for his nails in over a year according to patient.  Podiatric Exam: Vascular: dorsalis pedis and posterior tibial pulses are palpable bilateral. Capillary return is immediate. Temperature gradient is WNL. Skin turgor WNL  Sensorium: Diminished  Semmes Weinstein monofilament test. Normal tactile sensation bilaterally. Nail Exam: Pt has thick disfigured discolored nails with subungual debris noted bilateral entire nail hallux through fifth toenails Ulcer Exam: There is no evidence of ulcer or pre-ulcerative changes or infection. Orthopedic Exam: Muscle tone and strength are WNL. No limitations in general ROM. No crepitus or effusions noted. Foot type and digits show no abnormalities. Bony prominences are unremarkable. Skin: No Porokeratosis. No infection or ulcers  Diagnosis:  Tinea unguium, Pain in right toe, pain in left toes  Treatment & Plan Procedures and Treatment: Consent by patient was obtained for treatment procedures. The patient understood the discussion of treatment and procedures well. All questions were answered thoroughly reviewed. Debridement of mycotic and hypertrophic toenails, 1 through 5 bilateral and clearing of subungual debris. No ulceration, no infection noted.  Return Visit-Office Procedure: Patient instructed to return to the office for a follow up visit 4 months for continued evaluation and treatment.

## 2015-04-28 NOTE — Progress Notes (Signed)
Today, Shimshon exercised at [facility]. Service time was from 1330 to 1540.  The patient exercised by performing aerobic, strengthening, and stretching exercises. Oxygen saturation, heart rate, blood pressure, rate of perceived exertion, and shortness of breath were all monitored before, during, and after exercise. Laymon presented with no problems at today's exercise session. Orvis also attended an education session on oxygen use and safety.  The patient did not have an increase in workload intensity during today's exercise session.  Pre-exercise vitals: . Weight kg: 116.1 . Liters of O2: 2L . SpO2: 96 . HR: 83 . BP: 130/70 . CBG: 221  Exercise vitals: . Highest heartrate:  97 . Lowest oxygen saturation: 94 . Highest blood pressure: 128/62 . Liters of 02: 2L  Post-exercise vitals: . SpO2: 95 . HR: 78 . BP: 106/54 . Liters of O2: 2L . CBG: 143  Dr. Brand Males, Medical Director Dr. Tana Coast is immediately available during today's Pulmonary Rehab session for [name] on 04/28/2015 at 1330 class time.

## 2015-05-03 ENCOUNTER — Encounter (HOSPITAL_COMMUNITY)
Admission: RE | Admit: 2015-05-03 | Discharge: 2015-05-03 | Disposition: A | Discharge status: Home / Self Care | Payer: Medicare Other | Source: Ambulatory Visit | Attending: Internal Medicine | Admitting: Internal Medicine

## 2015-05-03 DIAGNOSIS — J439 Emphysema, unspecified: Secondary | ICD-10-CM | POA: Diagnosis not present

## 2015-05-03 NOTE — Progress Notes (Signed)
Today, Paul [name] exercised at [facility]. Service time was from 1330 to 1500.  The patient exercised by performing aerobic, strengthening, and stretching exercises. Oxygen saturation, heart rate, blood pressure, rate of perceived exertion, and shortness of breath were all monitored before, during, and after exercise. [name] presented with no problems at today's exercise session.  The patient did not have an increase in workload intensity during today's exercise session.  Pre-exercise vitals: . Weight kg: 116.5 . Liters of O2: 2 . SpO2: 96 . HR: 85 . BP: 110/46 . CBG: 245  Exercise vitals: . Highest heartrate:  97 . Lowest oxygen saturation: 93 . Highest blood pressure: 140/54 . Liters of 02: 2  Post-exercise vitals: . SpO2: 95 . HR: 79 . BP: 94/50 recheck 104/50 . Liters of O2: 2 . CBG: 140 Dr. Brand Males, Medical Director Dr. Tana Coast is immediately available during today's Pulmonary Rehab session for Paul [name] on 05/03/2015  at 1330 class time.  Marland Kitchen

## 2015-05-05 ENCOUNTER — Encounter (HOSPITAL_COMMUNITY)
Admission: RE | Admit: 2015-05-05 | Discharge: 2015-05-05 | Disposition: A | Discharge status: Home / Self Care | Payer: Medicare Other | Source: Ambulatory Visit | Attending: Internal Medicine | Admitting: Internal Medicine

## 2015-05-05 DIAGNOSIS — J439 Emphysema, unspecified: Secondary | ICD-10-CM | POA: Diagnosis not present

## 2015-05-05 NOTE — Progress Notes (Signed)
Happy completed a Six-Minute Walk Test on 05/05/15 . Jyren walked 838 feet with 1 break lasting 10 seconds. The patient's lowest oxygen saturation was 90 %, highest heart rate was 120 bpm , and highest blood pressure was 172/80. The patient was on 3 liters of oxygen with a nasal cannula. Patient stated that hip pain (4/10) hindered their walk test.

## 2015-05-09 ENCOUNTER — Other Ambulatory Visit: Payer: Self-pay

## 2015-05-10 ENCOUNTER — Encounter (HOSPITAL_COMMUNITY): Payer: Medicare Other

## 2015-05-11 DIAGNOSIS — E780 Pure hypercholesterolemia: Secondary | ICD-10-CM | POA: Diagnosis not present

## 2015-05-11 DIAGNOSIS — E1165 Type 2 diabetes mellitus with hyperglycemia: Secondary | ICD-10-CM | POA: Diagnosis not present

## 2015-05-11 DIAGNOSIS — I10 Essential (primary) hypertension: Secondary | ICD-10-CM | POA: Diagnosis not present

## 2015-05-11 DIAGNOSIS — M79603 Pain in arm, unspecified: Secondary | ICD-10-CM | POA: Diagnosis not present

## 2015-05-11 DIAGNOSIS — F17210 Nicotine dependence, cigarettes, uncomplicated: Secondary | ICD-10-CM | POA: Diagnosis not present

## 2015-05-11 DIAGNOSIS — I4891 Unspecified atrial fibrillation: Secondary | ICD-10-CM | POA: Diagnosis not present

## 2015-05-11 DIAGNOSIS — E559 Vitamin D deficiency, unspecified: Secondary | ICD-10-CM | POA: Diagnosis not present

## 2015-05-12 ENCOUNTER — Telehealth (HOSPITAL_COMMUNITY): Payer: Self-pay | Admitting: *Deleted

## 2015-05-17 NOTE — Progress Notes (Signed)
Pulmonary Rehab Discharge Note   Paul [name] has graduated from Pulmonary Rehab.  He was a pleasure to have in the program.  He participated in all aspects of the program with determination.  He overcame the idea and practice of wearing oxygen while exercising in the program and at home.  He will continue exercising after graduation in the maintenance pulmonary rehab program.  He met his goals which were to refamiliarize himself with exercising and to increase his stamina so as to do more activities with his wife.  He controlled his diabetes well while in the program as well.

## 2015-05-24 ENCOUNTER — Telehealth (HOSPITAL_COMMUNITY): Payer: Self-pay | Admitting: *Deleted

## 2015-06-15 DIAGNOSIS — M25511 Pain in right shoulder: Secondary | ICD-10-CM | POA: Diagnosis not present

## 2015-06-15 DIAGNOSIS — Z01812 Encounter for preprocedural laboratory examination: Secondary | ICD-10-CM | POA: Diagnosis not present

## 2015-06-17 DIAGNOSIS — M25511 Pain in right shoulder: Secondary | ICD-10-CM | POA: Diagnosis not present

## 2015-06-22 DIAGNOSIS — S43431A Superior glenoid labrum lesion of right shoulder, initial encounter: Secondary | ICD-10-CM | POA: Diagnosis not present

## 2015-06-22 DIAGNOSIS — D1601 Benign neoplasm of scapula and long bones of right upper limb: Secondary | ICD-10-CM | POA: Diagnosis not present

## 2015-06-22 DIAGNOSIS — M7501 Adhesive capsulitis of right shoulder: Secondary | ICD-10-CM | POA: Diagnosis not present

## 2015-06-22 DIAGNOSIS — M75111 Incomplete rotator cuff tear or rupture of right shoulder, not specified as traumatic: Secondary | ICD-10-CM | POA: Diagnosis not present

## 2015-06-23 DIAGNOSIS — E11329 Type 2 diabetes mellitus with mild nonproliferative diabetic retinopathy without macular edema: Secondary | ICD-10-CM | POA: Diagnosis not present

## 2015-06-23 DIAGNOSIS — Z961 Presence of intraocular lens: Secondary | ICD-10-CM | POA: Diagnosis not present

## 2015-06-28 DIAGNOSIS — D492 Neoplasm of unspecified behavior of bone, soft tissue, and skin: Secondary | ICD-10-CM | POA: Diagnosis not present

## 2015-06-29 ENCOUNTER — Other Ambulatory Visit (HOSPITAL_COMMUNITY): Payer: Self-pay | Admitting: Orthopedic Surgery

## 2015-06-29 DIAGNOSIS — D492 Neoplasm of unspecified behavior of bone, soft tissue, and skin: Secondary | ICD-10-CM

## 2015-07-06 ENCOUNTER — Ambulatory Visit (HOSPITAL_COMMUNITY)
Admission: RE | Admit: 2015-07-06 | Discharge: 2015-07-06 | Disposition: A | Discharge status: Home / Self Care | Payer: Medicare Other | Source: Ambulatory Visit | Attending: Orthopedic Surgery | Admitting: Orthopedic Surgery

## 2015-07-06 DIAGNOSIS — D492 Neoplasm of unspecified behavior of bone, soft tissue, and skin: Secondary | ICD-10-CM | POA: Diagnosis not present

## 2015-07-06 DIAGNOSIS — Z85118 Personal history of other malignant neoplasm of bronchus and lung: Secondary | ICD-10-CM | POA: Diagnosis not present

## 2015-07-06 MED ORDER — TECHNETIUM TC 99M MEDRONATE IV KIT
27.5000 | PACK | Freq: Once | INTRAVENOUS | Status: AC | PRN
  Administered 2015-07-06: 27.5 via INTRAVENOUS

## 2015-07-11 DIAGNOSIS — Z79899 Other long term (current) drug therapy: Secondary | ICD-10-CM | POA: Diagnosis not present

## 2015-07-11 DIAGNOSIS — F17200 Nicotine dependence, unspecified, uncomplicated: Secondary | ICD-10-CM | POA: Diagnosis not present

## 2015-07-11 DIAGNOSIS — Z888 Allergy status to other drugs, medicaments and biological substances status: Secondary | ICD-10-CM | POA: Diagnosis not present

## 2015-07-11 DIAGNOSIS — D492 Neoplasm of unspecified behavior of bone, soft tissue, and skin: Secondary | ICD-10-CM | POA: Diagnosis not present

## 2015-07-11 DIAGNOSIS — Z885 Allergy status to narcotic agent status: Secondary | ICD-10-CM | POA: Diagnosis not present

## 2015-08-08 DIAGNOSIS — N509 Disorder of male genital organs, unspecified: Secondary | ICD-10-CM | POA: Diagnosis not present

## 2015-08-15 DIAGNOSIS — M25511 Pain in right shoulder: Secondary | ICD-10-CM | POA: Diagnosis not present

## 2015-08-15 DIAGNOSIS — M7501 Adhesive capsulitis of right shoulder: Secondary | ICD-10-CM | POA: Diagnosis not present

## 2015-08-15 DIAGNOSIS — M75111 Incomplete rotator cuff tear or rupture of right shoulder, not specified as traumatic: Secondary | ICD-10-CM | POA: Diagnosis not present

## 2015-08-15 DIAGNOSIS — D1601 Benign neoplasm of scapula and long bones of right upper limb: Secondary | ICD-10-CM | POA: Diagnosis not present

## 2015-08-18 ENCOUNTER — Ambulatory Visit (INDEPENDENT_AMBULATORY_CARE_PROVIDER_SITE_OTHER): Payer: Medicare Other | Admitting: Podiatry

## 2015-08-18 DIAGNOSIS — M79673 Pain in unspecified foot: Secondary | ICD-10-CM

## 2015-08-18 DIAGNOSIS — B351 Tinea unguium: Secondary | ICD-10-CM

## 2015-08-18 DIAGNOSIS — E1140 Type 2 diabetes mellitus with diabetic neuropathy, unspecified: Secondary | ICD-10-CM

## 2015-08-18 DIAGNOSIS — M79609 Pain in unspecified limb: Principal | ICD-10-CM

## 2015-08-18 NOTE — Progress Notes (Signed)
Patient ID: Paul [name], male   DOB: May 29, 1941, 74 y.o.   MRN: [id number] Complaint:  Visit Type: Patient returns to my office for continued preventative foot care services. Complaint: Patient states" my nails have grown long and thick and become painful to walk and wear shoes" Patient has been diagnosed with DM with no complications. He presents for preventative foot care services. No changes to ROS.    Podiatric Exam: Vascular: dorsalis pedis and posterior tibial pulses are palpable bilateral. Capillary return is immediate. Temperature gradient is WNL. Skin turgor WNL  Sensorium: Diminished  Semmes Weinstein monofilament test. Normal tactile sensation bilaterally. Nail Exam: Pt has thick disfigured discolored nails with subungual debris noted bilateral entire nail hallux through fifth toenails Ulcer Exam: There is no evidence of ulcer or pre-ulcerative changes or infection. Orthopedic Exam: Muscle tone and strength are WNL. No limitations in general ROM. No crepitus or effusions noted. Foot type and digits show no abnormalities. Bony prominences are unremarkable. Skin: No Porokeratosis. No infection or ulcers  Diagnosis:  Tinea unguium, Pain in right toe, pain in left toes  Treatment & Plan Procedures and Treatment: Consent by patient was obtained for treatment procedures. The patient understood the discussion of treatment and procedures well. All questions were answered thoroughly reviewed. Debridement of mycotic and hypertrophic toenails, 1 through 5 bilateral and clearing of subungual debris. No ulceration, no infection noted.  Return Visit-Office Procedure: Patient instructed to return to the office for a follow up visit 4 months for continued evaluation and treatment.

## 2015-08-24 DIAGNOSIS — M75111 Incomplete rotator cuff tear or rupture of right shoulder, not specified as traumatic: Secondary | ICD-10-CM | POA: Diagnosis not present

## 2015-08-30 DIAGNOSIS — M75111 Incomplete rotator cuff tear or rupture of right shoulder, not specified as traumatic: Secondary | ICD-10-CM | POA: Diagnosis not present

## 2015-09-01 DIAGNOSIS — M75111 Incomplete rotator cuff tear or rupture of right shoulder, not specified as traumatic: Secondary | ICD-10-CM | POA: Diagnosis not present

## 2015-09-05 ENCOUNTER — Ambulatory Visit (INDEPENDENT_AMBULATORY_CARE_PROVIDER_SITE_OTHER): Payer: Medicare Other | Admitting: Internal Medicine

## 2015-09-05 ENCOUNTER — Telehealth: Payer: Self-pay | Admitting: Internal Medicine

## 2015-09-05 ENCOUNTER — Encounter: Payer: Self-pay | Admitting: Internal Medicine

## 2015-09-05 VITALS — BP 134/70 | HR 77 | Ht 69.5 in | Wt 253.6 lb

## 2015-09-05 DIAGNOSIS — J9611 Chronic respiratory failure with hypoxia: Secondary | ICD-10-CM | POA: Diagnosis not present

## 2015-09-05 DIAGNOSIS — J449 Chronic obstructive pulmonary disease, unspecified: Secondary | ICD-10-CM | POA: Diagnosis not present

## 2015-09-05 DIAGNOSIS — F17210 Nicotine dependence, cigarettes, uncomplicated: Secondary | ICD-10-CM

## 2015-09-05 DIAGNOSIS — Z720 Tobacco use: Secondary | ICD-10-CM | POA: Diagnosis not present

## 2015-09-05 MED ORDER — PREDNISONE 10 MG PO TABS: ORAL_TABLET | ORAL | Status: DC

## 2015-09-05 MED ORDER — AMOXICILLIN-POT CLAVULANATE 875-125 MG PO TABS: 1.0000 | ORAL_TABLET | Freq: Two times a day (BID) | ORAL | Status: DC

## 2015-09-05 NOTE — Telephone Encounter (Signed)
Per MW pt instructions Patient Instructions     The key is to stop smoking completely before smoking completely stops you!   Augmentin 875 mg take one pill twice daily X 10 days - take at breakfast and supper with large glass of water. It would help reduce the usual side effects (diarrhea and yeast infections) if you ate cultured yogurt at lunch.   Prednisone 10 mg take 4 each am x 2 days, 2 each am x 2 days, 1 each am x 2 days and stop     Pt went to pharmacy and these have not been sent in

## 2015-09-05 NOTE — Progress Notes (Signed)
Subjective:    Patient ID: Paul [name], male    DOB: 01/02/1941  MRN: [id number]    Brief patient profile:  74yowm smoker with dx of copd with worse symptoms since 2013 referred to pulmonary clinic 12/24/2012 by Dr Darcus Austin for COPD evaluation with GOLD II criteria May 2014    History of Present Illness  12/24/2012 1st pulmonary eval  On advair/ spiriva longterm with freq use of proventil prior to dx  PE presenting as pleurtic L CP May 2013 with "moderate clot burden" to  [facility] assoc with PAF and started on Xarelto and baseline doe x climbing steps but could walk flat all day long before and after PE but then indolent onset doe progressively worse x 9 months assoc with cough congestion > green mucus better p on levaquin x one week but convinced his coughing and breathing problems are all due to xarelto because of what he read on the package. rec Only use your albuterol (proaire) as a rescue medication (plan B)  to be used if you can't catch your breath   The key is to stop smoking completely before smoking completely stops you     02/05/2013 f/u ov/[name] cc need less albuterol maint on advair and spiriva with worse cough and sob x mailbox and back and collapse in chair.   Mucus turned green again x 2 weeks prior to OV   rec Stop advair and lisinopril Start symbicort 160 Take 2 puffs first thing in am and then another 2 puffs about 12 hours later.  Only use your albuterol(proaire)  as a rescue medication to be used if you can't catch your breath by resting or doing a relaxed purse lip breathing pattern. The less you use it, the better it will work when you need it.  Prednisone 10 mg take  4 each am x 2 days,   2 each am x 2 days,  1 each am x2days and stop  Levaquin 750 x 5 days For cough mucinex dm best as needed  03/26/2013 f/u ov/[name] re copd Chief Complaint  Patient presents with  . Follow-up    Breathing is unchanged since the last visit. Ran out of symbicort so started back on  advair about 2 wks ago. He could not tell difference between the 2 meds, but spouse noted he did not cough as much on symbicort.  mucus is more day than night, usually not green rec Ok to stop spiriva Work on Engineer, technical sales technique:      06/21/2014 f/u ov/[name] re: GOLD II COPD / still smoking, concerned re side effects of meds  Chief Complaint  Patient presents with  . Follow-up    Pt states had six minute walk test which was abnormal. He states that he was referred here to discuss this per Dr Darcus Austin. Pt was last seen here in May 2014 and states that his breathing is unchanged since then.     lots of phlegm and congestion each am and occ wakes at 3 am but mostly daytime. 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 Follow-up    Pt states that his cough and SOB are unchanged since the last visit. No new co's today.    no proair  Day of ov/ pacing himself and no longer doing steps so less sob/ less saba need - rattling cough esp in am, thick, slt green, not on max dose mucinex but seems to helps whereas even levaquin did not >> no changes , declined O2 .   03/07/2015 Follow up COPD /still smoking  Patient returns for a six-month follow-up. He says overall he has been doing okay He is currently a pulmonary rehabilitation and feels that it is really helping him. His O2 saturations do drop with exercise and he uses oxygen at pulmonary rehabilitation [facility] , he wears O2 with exercise and does much better.  O2 sat at rest are 95% walking. Today in office with drop with O2 saturation 88% on room air. Patient  denies any chest pain, palpitations, orthopnea, PND, or increased leg swelling. He does request that his oxygen orders be sent to [facility]  and is looking at the  portable concentrator unit-Innogen.  He continues to smoke. We discussed cessation. He says he has no interest in quitting smoking. We discussed the dangers of oxygen and smoking. Patient was noted to have oxygen desaturations. Last visit and declined oxygen but now says that he is rated begin oxygen. rec Continue on current regimen  Work on not smoking  Begin Oxygen 2l/m with activity  Order sent to [name] for evaluation of portable concentrator >  Inogen 2lpm pulse and 3lpm pulse when walk       09/05/2015  f/u ov/[name] re: COPD II/ still smoking/ on advair 250 bid and rare saba at baseline  Chief Complaint  Patient presents with  . Follow-up    Pt c/o increased SOB and cough for the past wk. His cough is prod with green sputum and "tastes like clorox bleach".  baseline doe = MMRC2 = can't walk a nl pace on a flat grade s sob, worse with onset of cough and increase need for saba but still no more than twice daily   No obvious day to day or daytime variability or assoc cp or chest tightness, subjective wheeze or overt sinus or hb symptoms. No unusual exp hx or h/o childhood pna/ asthma or knowledge of premature birth.  Sleeping ok without nocturnal  or early am exacerbation  of respiratory  c/o's or need for noct saba. Also denies any obvious fluctuation of symptoms with weather or environmental changes or other aggravating or alleviating factors except as outlined above   Current Medications, Allergies, Complete Past Medical History, Past Surgical History, Family History, and Social History were reviewed in [employer] record.  ROS  The following are not active complaints unless bolded sore throat, dysphagia, dental problems, itching, sneezing,  nasal congestion or excess/ purulent secretions, ear ache,    fever, chills, sweats, unintended wt loss, classically pleuritic or exertional cp, hemoptysis,  orthopnea pnd or leg swelling, presyncope, palpitations, abdominal pain, anorexia, nausea, vomiting, diarrhea  or change in bowel or bladder habits, change in stools or urine, dysuria,hematuria,  rash, arthralgias, visual complaints, headache, numbnesweakness or ataxia or problems with walking or coordination,  change in mood/affect or memory.                    Objective:   Physical Exam  02/05/2013  Wt 266 > 03/26/2013  269 >  06/21/2014 261 > 08/04/2014  256 >257 03/07/2015 > 09/05/2015  254   amb obese wm   HEENT mild turbinate edema.  Oropharynx no thrush or excess pnd or cobblestoning.  No JVD or cervical adenopathy. Mild accessory muscle hypertrophy. Trachea midline, nl thryroid. Chest was hyperinflated by percussion with diminished breath sounds and moderate increased exp time with insp and ex rhonchi bilaterally. Hoover sign positive at mid inspiration. Kyphosis  Regular rate and rhythm without murmur gallop or rub or increase P2 or edema.  Abd: no hsm, nl excursion. Ext warm without cyanosis or clubbing.       CXR  06/21/2014 : Grossly stable postoperative chest. No acute process or evidence of metastatic disease.   sinus ct neg 06/25/14      Assessment & Plan:

## 2015-09-05 NOTE — Patient Instructions (Addendum)
The key is to stop smoking completely before smoking completely stops you!   Augmentin 875 mg take one pill twice daily  X 10 days - take at breakfast and supper with large glass of water.  It would help reduce the usual side effects (diarrhea and yeast infections) if you ate cultured yogurt at lunch.   Prednisone 10 mg take  4 each am x 2 days,   2 each am x 2 days,  1 each am x 2 days and stop   Call if losing ground on your best days in terms of breathing > we will repeat your pfts then

## 2015-09-05 NOTE — Telephone Encounter (Signed)
rx's were not sent in this morning.  escribed rx's.  Pt aware.  Nothing further needed.

## 2015-09-11 DIAGNOSIS — J9611 Chronic respiratory failure with hypoxia: Secondary | ICD-10-CM | POA: Insufficient documentation

## 2015-09-11 NOTE — Assessment & Plan Note (Signed)
-  HFA 90% 09/05/2015  - PFT's 03/12/13 FEV1  2.27 (79%) ratio 52 and no better p B2 DLCO 45 and 60% p correction - Ex desats documented 06/21/14 > refused 02 > agreed to accept 03/26/15 see chronic resp failure   Mild flare in setting of ongoing cig use discussed sep  rec augmentin/ pred   I had an extended discussion with the patient reviewing all relevant studies completed to date and  lasting 15 to 20 minutes of a 25 minute visit    Each maintenance medication was reviewed in detail including most importantly the difference between maintenance and prns and under what circumstances the prns are to be triggered using an action plan format that is not reflected in the computer generated alphabetically organized AVS.    Please see instructions for details which were reviewed in writing and the patient given a copy highlighting the part that I personally wrote and discussed at today's ov.

## 2015-09-11 NOTE — Assessment & Plan Note (Signed)
>   3 m  I took an extended  opportunity with this patient to outline the consequences of continued cigarette use  in airway disorders based on all the data we have from the multiple national lung health studies (perfomed over decades at millions of dollars in cost)  indicating that smoking cessation, not choice of inhalers or physicians, is the most important aspect of care.

## 2015-09-11 NOTE — Assessment & Plan Note (Signed)
Complicated by dm, hbp/ hyperlipidemia  Body mass index is 36.93 kg/(m^2).  Lab Results  Component Value Date   TSH 1.91 05/26/2012     Contributing to gerd tendency/ doe/reviewed the need and the process to achieve and maintain neg calorie balance > defer f/u primary care including intermittently monitoring thyroid status

## 2015-09-11 NOTE — Assessment & Plan Note (Signed)
04/05/15 O2 2lpm with activity and at bedtime [employer] SATURATION QUALIFICATIONS: (This note is used to comply with regulatory documentation for home oxygen) Patient Saturations on Room Air at Rest = 95% Patient Saturations on Room Air while Ambulating = 88% Patient Saturations on 2 Liters of oxygen while Ambulating = 93% Please briefly explain why patient needs home oxygen: desats with ambulation  Note sats ok ra today despite off 02 despite mild flare copd

## 2015-09-13 DIAGNOSIS — M75111 Incomplete rotator cuff tear or rupture of right shoulder, not specified as traumatic: Secondary | ICD-10-CM | POA: Diagnosis not present

## 2015-09-15 DIAGNOSIS — M75111 Incomplete rotator cuff tear or rupture of right shoulder, not specified as traumatic: Secondary | ICD-10-CM | POA: Diagnosis not present

## 2015-09-16 DIAGNOSIS — M7501 Adhesive capsulitis of right shoulder: Secondary | ICD-10-CM | POA: Diagnosis not present

## 2015-09-16 DIAGNOSIS — M75111 Incomplete rotator cuff tear or rupture of right shoulder, not specified as traumatic: Secondary | ICD-10-CM | POA: Diagnosis not present

## 2015-09-19 DIAGNOSIS — M75111 Incomplete rotator cuff tear or rupture of right shoulder, not specified as traumatic: Secondary | ICD-10-CM | POA: Diagnosis not present

## 2015-09-20 DIAGNOSIS — Z23 Encounter for immunization: Secondary | ICD-10-CM | POA: Diagnosis not present

## 2015-09-23 DIAGNOSIS — M75111 Incomplete rotator cuff tear or rupture of right shoulder, not specified as traumatic: Secondary | ICD-10-CM | POA: Diagnosis not present

## 2015-09-27 DIAGNOSIS — M75111 Incomplete rotator cuff tear or rupture of right shoulder, not specified as traumatic: Secondary | ICD-10-CM | POA: Diagnosis not present

## 2015-09-27 DIAGNOSIS — M7501 Adhesive capsulitis of right shoulder: Secondary | ICD-10-CM | POA: Diagnosis not present

## 2015-09-29 DIAGNOSIS — M75111 Incomplete rotator cuff tear or rupture of right shoulder, not specified as traumatic: Secondary | ICD-10-CM | POA: Diagnosis not present

## 2015-10-04 DIAGNOSIS — M75111 Incomplete rotator cuff tear or rupture of right shoulder, not specified as traumatic: Secondary | ICD-10-CM | POA: Diagnosis not present

## 2015-10-11 DIAGNOSIS — M75111 Incomplete rotator cuff tear or rupture of right shoulder, not specified as traumatic: Secondary | ICD-10-CM | POA: Diagnosis not present

## 2015-10-13 DIAGNOSIS — M75111 Incomplete rotator cuff tear or rupture of right shoulder, not specified as traumatic: Secondary | ICD-10-CM | POA: Diagnosis not present

## 2015-10-14 ENCOUNTER — Other Ambulatory Visit: Payer: Self-pay | Admitting: Neurology

## 2015-10-18 DIAGNOSIS — M75111 Incomplete rotator cuff tear or rupture of right shoulder, not specified as traumatic: Secondary | ICD-10-CM | POA: Diagnosis not present

## 2015-10-20 DIAGNOSIS — M75111 Incomplete rotator cuff tear or rupture of right shoulder, not specified as traumatic: Secondary | ICD-10-CM | POA: Diagnosis not present

## 2015-10-21 DIAGNOSIS — M75111 Incomplete rotator cuff tear or rupture of right shoulder, not specified as traumatic: Secondary | ICD-10-CM | POA: Diagnosis not present

## 2015-10-21 DIAGNOSIS — M7501 Adhesive capsulitis of right shoulder: Secondary | ICD-10-CM | POA: Diagnosis not present

## 2015-10-24 DIAGNOSIS — M75111 Incomplete rotator cuff tear or rupture of right shoulder, not specified as traumatic: Secondary | ICD-10-CM | POA: Diagnosis not present

## 2015-10-25 ENCOUNTER — Encounter: Payer: Self-pay | Admitting: Nurse Practitioner

## 2015-10-25 ENCOUNTER — Ambulatory Visit (INDEPENDENT_AMBULATORY_CARE_PROVIDER_SITE_OTHER): Payer: Medicare Other | Admitting: Nurse Practitioner

## 2015-10-25 VITALS — BP 130/77 | HR 81 | Ht 69.5 in | Wt 252.0 lb

## 2015-10-25 DIAGNOSIS — M792 Neuralgia and neuritis, unspecified: Secondary | ICD-10-CM

## 2015-10-25 DIAGNOSIS — G609 Hereditary and idiopathic neuropathy, unspecified: Secondary | ICD-10-CM

## 2015-10-25 MED ORDER — GABAPENTIN 300 MG PO CAPS: ORAL_CAPSULE | ORAL | Status: DC

## 2015-10-25 NOTE — Progress Notes (Signed)
[facility]  PATIENT: Paul [name] DOB: 1941/10/30   REASON FOR VISIT: follow up for peripheral neuralgia, diabetes, obesity HISTORY FROM:patient    HISTORY OF PRESENT ILLNESS:Paul [name] is a 74 -year-old right-handed Caucasian male, came in to followup his peripheral neuropathy, clinical visit was November 2013, He has past medical history of obesity, hypertension, poorly controlled diabetes, flatfeet, presenting with few years history of gradual onset, very slowly progressing, length dependent axonal peripheral neuropathy, which is confirmed by electrodiagnostic study, mild to moderately severe, also complicated by his lifelong history of flatfeet. He continues to do well, with his bilateral feet parethesia, well controlled by Neurontin 300 mg +3 tablets 825m every night. He complains of daytime sleepiness, dozing off while watching TV at 7 clock each evening, he reports previous history of sleep study, decline repeat study. Independent in ADL's, driving without problems. One fall in the past year, going up over a curb, now using cane. No new neurologic complaints. He is taking Neurontin 8052m3 tabs qhs, he is mildly symptomatic of his peripheral neuropathy from his current management, he has bilateral flat feet, mild gait difficulty, he also complains of irregular sleep pattern, he went to bed around 10 PM, after taking his Neurontin 800 mg 3 tablets, he was able to sleep 3-4 hours, afterwards, he needs to use the bathroom, sleep another to 3 hours, he become widely awake at 5 AM, difficulty falling back to sleep again, but after breakfast, he took to 3 hours nap in the morning, he is not exercising regularly  UPDATE 10/27/2014 Paul [name], [age]ear old male returns for follow-up. He has a history of peripheral neuropathy and medical history of obesity, hypertension, and poorly controlled diabetes. He has a slowly progressing length dependent axonal peripheral  neuropathy which was confirmed by electrodiagnostic study mild to moderately severe. He is currently on gabapentin 600 mg pills but wants to switch to the capsules. He denies any recent falls and his symptoms are under good control. He returns for evaluation and refills  UPDATE 10/25/2015 Paul [name], [age]ear old male returns for follow-up. He has a history of peripheral neuropathy and medical history of obesity hypertension and poorly controlled diabetes. Last hemoglobin A1c was 7. He has a slowly progressing length dependent axonal peripheral neuropathy which was confirmed by electrodiagnostic study. He is currently on gabapentin 1800 mg at bedtime and he has had 2 recent falls getting up first thing in the morning without his cane. He is wanting to reduce his dose by one capsule see if that makes a difference in his drowsiness on awakening.His neuropathy symptoms are in good control.he returns for reevaluation   REVIEW OF SYSTEMS: Full 14 system review of systems performed and notable only for those listed, all others are neg:  Constitutional: neg  Cardiovascular: neg Ear/Nose/Throat: neg  Skin: neg Eyes: neg Respiratory: cough shortness of breath Gastroitestinal: neg  Hematology/Lymphatic: neg  Endocrine: neg Musculoskeletal:joint pain muscle cramps walking difficulty Allergy/Immunology: environmental allergies Neurological: neg Psychiatric: neg Sleep : neg   ALLERGIES: Allergies  Allergen Reactions  . Codeine Anaphylaxis  . Phenobarbital Hypertension    HOME MEDICATIONS: Outpatient Prescriptions Prior to Visit  Medication Sig Dispense Refill  . acetaminophen (TYLENOL) 500 MG tablet Take 1,000 mg by mouth every 6 (six) hours as needed for pain.     . Marland KitchenDVAIR DISKUS 250-50 MCG/DOSE AEPB Inhale 1 puff into the lungs 2 (two) times daily.   3  . albuterol (PROAIR HFA) 108 (90 BASE) MCG/ACT inhaler Inhale 2 puffs  into the lungs every 6 (six) hours as needed for wheezing or shortness of  breath.    Marland Kitchen aspirin EC 81 MG tablet Take 1 tablet (81 mg total) by mouth daily. 90 tablet 3  . calcium carbonate (OS-CAL) 600 MG TABS tablet Take 600 mg by mouth 2 (two) times daily with a meal.    . fexofenadine (ALLEGRA) 180 MG tablet Take 180 mg by mouth daily.      . fluticasone (FLONASE) 50 MCG/ACT nasal spray Place 2 sprays into the nose daily.      Marland Kitchen gabapentin (NEURONTIN) 300 MG capsule Take 6 capsules by mouth at bedtime 540 capsule 0  . glimepiride (AMARYL) 4 MG tablet Take 2 mg by mouth 2 (two) times daily.     Marland Kitchen guaiFENesin (MUCINEX) 600 MG 12 hr tablet Take 1,200 mg by mouth daily.    . hydrochlorothiazide (HYDRODIURIL) 12.5 MG tablet Take 12.5 mg by mouth daily.      Marland Kitchen losartan (COZAAR) 50 MG tablet Take 1 tablet (50 mg total) by mouth daily. 90 tablet 0  . metFORMIN (GLUCOPHAGE) 1000 MG tablet Take 1,000 mg by mouth 2 (two) times daily with a meal.      . metoCLOPramide (REGLAN) 10 MG tablet Take 10 mg by mouth 2 (two) times daily.    . Multiple Vitamin (MULTIVITAMIN) tablet Take 1 tablet by mouth daily. Centrum silver     . pioglitazone (ACTOS) 45 MG tablet Take 45 mg by mouth daily.      . pravastatin (PRAVACHOL) 40 MG tablet Take 1 tablet (40 mg total) by mouth daily. 90 tablet 3  . Vitamin D, Ergocalciferol, (DRISDOL) 50000 UNITS CAPS capsule Take 50,000 Units by mouth every 7 (seven) days.    . Multiple Vitamins-Minerals (CENTRUM SILVER PO) Take by mouth daily.    Marland Kitchen amoxicillin-clavulanate (AUGMENTIN) 875-125 MG tablet Take 1 tablet by mouth 2 (two) times daily. (Patient not taking: Reported on 10/25/2015) 20 tablet 0  . Dextromethorphan-Guaifenesin (MUCINEX DM MAXIMUM STRENGTH) 60-1200 MG TB12 Take by mouth daily.    . predniSONE (DELTASONE) 10 MG tablet 56m X2 days, 222mX2 days, 1079m2 days, then stop. (Patient not taking: Reported on 10/25/2015) 14 tablet 0   No facility-administered medications prior to visit.    PAST MEDICAL HISTORY: Past Medical History    Diagnosis Date  . Diabetes mellitus   . Hypertension   . COPD (chronic obstructive pulmonary disease) ([facility] . Asthma   . GERD (gastroesophageal reflux disease)   . Osteopenia   . Pulmonary embolus ([facility] . Atrial fibrillation ([facility] . Hyperlipidemia   . CAD (coronary artery disease)     Prior PTCA  . Nephrolithiasis   . Tremor, essential   . lung ca dx'd 01/2006    PAST SURGICAL HISTORY: Past Surgical History  Procedure Laterality Date  . Lung cancer surgery  01/2006    Small excision of left lung tissue; mesh with radioactive seeds (per pt report)  . Cholecystectomy    . Tonsillectomy    . Angioplasty  Approx 1993  . Cardiac catheterization  Approximately 1993  . Fixation kyphoplasty    . 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 Rotator cuff tear  2016    FAMILY HISTORY: Family History  Problem Relation Age of Onset  . Heart disease Mother   . Tremor Mother   . Heart Problems Father   . Dementia Mother     SOCIAL HISTORY:  Social History   Social History  . Marital Status: Married    Spouse Name: Santiago Glad  . Number of Children: 2  . Years of Education: college   Occupational History  .      Retired from [employer] and [place]  . Smoking status: Current Every Day Smoker -- 0.50 packs/day for 59 years    Types: Cigarettes  . Smokeless tobacco: Never Used  . Alcohol Use: 0.6 oz/week    1 Glasses of wine per week     Comment: Rare  . Drug Use: No  . Sexual Activity: No   Other Topics Concern  . Not on file   Social History Narrative   Patient lives at home with his wife Santiago Glad).   Retired.   Education- College    Right handed.   Caffeine- three cups of coffee daily.     PHYSICAL EXAM  Filed Vitals:   10/25/15 1356  BP: 130/77  Pulse: 81  Height: 5' 9.5" (1.765 m)  Weight: 252 lb (114.306 kg)   Body mass index is 36.69 kg/(m^2). Generalized: In no acute distress, obese male, well-groomed  Neck: Supple, no carotid bruits   Cardiac: Regular rate rhythm Pulmonary: Clear to auscultation bilaterally Musculoskeletal: No deformity  Neurological examination Mentation: Alert oriented to time, place, history taking, and causual conversation Cranial nerve II-XII: Pupils were equal round reactive to light extraocular movements were full, Visual field were full on confrontational test. Bilateral fundi were sharp. Facial sensation and strength were normal. Hearing was intact to finger rubbing bilaterally. Uvula tongue midline. head turning and shoulder shrug and were normal and symmetric.Tongue protrusion into cheek strength was normal. Motor: mild bilateral ankle dorsiflexion weakness. Sensory: length dependent decreased to fine touch, pinprick to midshin, absent toe vibratory sensation, and proprioception at toes. Coordination: Normal finger to nose, heel-to-shin bilaterally there was no truncal ataxia Gait: Rising up from seated position by pushing on chair arm, flat foot, mild bilateral foot drop, unsteady with tandem ,ambulates with cane Romberg signs: Negative Deep tendon reflexes: Brachioradialis 2/2, biceps 2/2, triceps 2/2, patellar 1/1, Achilles absent, plantar responses were flexor bilaterally. DIAGNOSTIC DATA (LABS, IMAGING, TESTING) - I reviewed patient records, labs, notes, testing and imaging myself where available.    ASSESSMENT AND PLAN 74 y.o. year old male has a past medical history of Diabetes mellitus; Hypertension; COPD (chronic obstructive pulmonary disease); Asthma; Pulmonary embolus; Atrial fibrillation; Hyperlipidemia; CAD (coronary artery disease); Tremor, essential; and peripheral neuropathy . He also has history of lumbar compression fracture, denies any incontinence or significant low back pain.   Decrease gabapentin to 5 caps at night due to drowsiness Continue to use  cane for safe ambulation Weight loss would be beneficial for both her diabetes and neuropathy and joint  pain Follow-up in one year Next visit with Dr. Luan Pulling, [facility], [facility], [facility] [address] [place], [place] [address] [phone]

## 2015-10-25 NOTE — Patient Instructions (Signed)
Decrease gabapentin to 5 caps at night Continue to use  cane for safe ambulation Follow-up in one year Next visit with Dr. Krista Blue

## 2015-10-25 NOTE — Progress Notes (Signed)
I have reviewed and agreed above plan. 

## 2015-10-27 DIAGNOSIS — M75111 Incomplete rotator cuff tear or rupture of right shoulder, not specified as traumatic: Secondary | ICD-10-CM | POA: Diagnosis not present

## 2015-10-31 ENCOUNTER — Ambulatory Visit: Payer: Medicare Other | Admitting: Nurse Practitioner

## 2015-11-01 DIAGNOSIS — M75111 Incomplete rotator cuff tear or rupture of right shoulder, not specified as traumatic: Secondary | ICD-10-CM | POA: Diagnosis not present

## 2015-11-03 DIAGNOSIS — M75111 Incomplete rotator cuff tear or rupture of right shoulder, not specified as traumatic: Secondary | ICD-10-CM | POA: Diagnosis not present

## 2015-11-08 DIAGNOSIS — M75111 Incomplete rotator cuff tear or rupture of right shoulder, not specified as traumatic: Secondary | ICD-10-CM | POA: Diagnosis not present

## 2015-11-09 DIAGNOSIS — I4891 Unspecified atrial fibrillation: Secondary | ICD-10-CM | POA: Diagnosis not present

## 2015-11-09 DIAGNOSIS — E7800 Pure hypercholesterolemia, unspecified: Secondary | ICD-10-CM | POA: Diagnosis not present

## 2015-11-09 DIAGNOSIS — I10 Essential (primary) hypertension: Secondary | ICD-10-CM | POA: Diagnosis not present

## 2015-11-09 DIAGNOSIS — E1165 Type 2 diabetes mellitus with hyperglycemia: Secondary | ICD-10-CM | POA: Diagnosis not present

## 2015-11-09 DIAGNOSIS — Z720 Tobacco use: Secondary | ICD-10-CM | POA: Diagnosis not present

## 2015-11-09 DIAGNOSIS — J449 Chronic obstructive pulmonary disease, unspecified: Secondary | ICD-10-CM | POA: Diagnosis not present

## 2015-11-17 ENCOUNTER — Ambulatory Visit: Payer: Medicare Other | Admitting: Podiatry

## 2015-11-17 DIAGNOSIS — M75111 Incomplete rotator cuff tear or rupture of right shoulder, not specified as traumatic: Secondary | ICD-10-CM | POA: Diagnosis not present

## 2015-11-18 DIAGNOSIS — M75111 Incomplete rotator cuff tear or rupture of right shoulder, not specified as traumatic: Secondary | ICD-10-CM | POA: Diagnosis not present

## 2015-11-30 ENCOUNTER — Encounter: Payer: Self-pay | Admitting: Podiatry

## 2015-11-30 ENCOUNTER — Ambulatory Visit (INDEPENDENT_AMBULATORY_CARE_PROVIDER_SITE_OTHER): Payer: Medicare Other | Admitting: Podiatry

## 2015-11-30 DIAGNOSIS — B351 Tinea unguium: Secondary | ICD-10-CM

## 2015-11-30 DIAGNOSIS — M79673 Pain in unspecified foot: Secondary | ICD-10-CM

## 2015-11-30 DIAGNOSIS — M79609 Pain in unspecified limb: Principal | ICD-10-CM

## 2015-11-30 NOTE — Progress Notes (Signed)
Patient ID: Paul [name], male   DOB: 1941/08/30, 75 y.o.   MRN: [id number] Complaint:  Visit Type: Patient returns to my office for continued preventative foot care services. Complaint: Patient states" my nails have grown long and thick and become painful to walk and wear shoes" Patient has been diagnosed with DM with no complications. He presents for preventative foot care services. No changes to ROS.    Podiatric Exam: Vascular: dorsalis pedis and posterior tibial pulses are palpable bilateral. Capillary return is immediate. Temperature gradient is WNL. Skin turgor WNL  Sensorium: Diminished  Semmes Weinstein monofilament test. Normal tactile sensation bilaterally. Nail Exam: Pt has thick disfigured discolored nails with subungual debris noted bilateral entire nail hallux through fifth toenails Ulcer Exam: There is no evidence of ulcer or pre-ulcerative changes or infection. Orthopedic Exam: Muscle tone and strength are WNL. No limitations in general ROM. No crepitus or effusions noted. Foot type and digits show no abnormalities. Bony prominences are unremarkable. Skin: No Porokeratosis. No infection or ulcers  Diagnosis:  Tinea unguium, Pain in right toe, pain in left toes  Treatment & Plan Procedures and Treatment: Consent by patient was obtained for treatment procedures. The patient understood the discussion of treatment and procedures well. All questions were answered thoroughly reviewed. Debridement of mycotic and hypertrophic toenails, 1 through 5 bilateral and clearing of subungual debris. No ulceration, no infection noted.  Return Visit-Office Procedure: Patient instructed to return to the office for a follow up visit 4 months for continued evaluation and treatment.   Gardiner Barefoot DPM

## 2015-12-06 DIAGNOSIS — M75111 Incomplete rotator cuff tear or rupture of right shoulder, not specified as traumatic: Secondary | ICD-10-CM | POA: Diagnosis not present

## 2015-12-09 DIAGNOSIS — M75111 Incomplete rotator cuff tear or rupture of right shoulder, not specified as traumatic: Secondary | ICD-10-CM | POA: Diagnosis not present

## 2015-12-13 DIAGNOSIS — M75111 Incomplete rotator cuff tear or rupture of right shoulder, not specified as traumatic: Secondary | ICD-10-CM | POA: Diagnosis not present

## 2015-12-15 DIAGNOSIS — M75111 Incomplete rotator cuff tear or rupture of right shoulder, not specified as traumatic: Secondary | ICD-10-CM | POA: Diagnosis not present

## 2015-12-20 DIAGNOSIS — M75111 Incomplete rotator cuff tear or rupture of right shoulder, not specified as traumatic: Secondary | ICD-10-CM | POA: Diagnosis not present

## 2015-12-29 DIAGNOSIS — M75111 Incomplete rotator cuff tear or rupture of right shoulder, not specified as traumatic: Secondary | ICD-10-CM | POA: Diagnosis not present

## 2015-12-30 DIAGNOSIS — M75111 Incomplete rotator cuff tear or rupture of right shoulder, not specified as traumatic: Secondary | ICD-10-CM | POA: Diagnosis not present

## 2016-01-12 DIAGNOSIS — I4891 Unspecified atrial fibrillation: Secondary | ICD-10-CM | POA: Diagnosis not present

## 2016-01-12 DIAGNOSIS — J449 Chronic obstructive pulmonary disease, unspecified: Secondary | ICD-10-CM | POA: Diagnosis not present

## 2016-01-12 DIAGNOSIS — E1165 Type 2 diabetes mellitus with hyperglycemia: Secondary | ICD-10-CM | POA: Diagnosis not present

## 2016-01-12 DIAGNOSIS — R0789 Other chest pain: Secondary | ICD-10-CM | POA: Diagnosis not present

## 2016-01-12 DIAGNOSIS — I10 Essential (primary) hypertension: Secondary | ICD-10-CM | POA: Diagnosis not present

## 2016-01-12 DIAGNOSIS — Z85118 Personal history of other malignant neoplasm of bronchus and lung: Secondary | ICD-10-CM | POA: Diagnosis not present

## 2016-01-12 DIAGNOSIS — Z125 Encounter for screening for malignant neoplasm of prostate: Secondary | ICD-10-CM | POA: Diagnosis not present

## 2016-01-12 DIAGNOSIS — F17210 Nicotine dependence, cigarettes, uncomplicated: Secondary | ICD-10-CM | POA: Diagnosis not present

## 2016-01-12 DIAGNOSIS — E559 Vitamin D deficiency, unspecified: Secondary | ICD-10-CM | POA: Diagnosis not present

## 2016-01-12 DIAGNOSIS — Z0000 Encounter for general adult medical examination without abnormal findings: Secondary | ICD-10-CM | POA: Diagnosis not present

## 2016-01-12 DIAGNOSIS — E7800 Pure hypercholesterolemia, unspecified: Secondary | ICD-10-CM | POA: Diagnosis not present

## 2016-01-12 DIAGNOSIS — M810 Age-related osteoporosis without current pathological fracture: Secondary | ICD-10-CM | POA: Diagnosis not present

## 2016-01-16 ENCOUNTER — Other Ambulatory Visit: Payer: Self-pay | Admitting: Family Medicine

## 2016-01-16 DIAGNOSIS — Z85118 Personal history of other malignant neoplasm of bronchus and lung: Secondary | ICD-10-CM

## 2016-01-18 ENCOUNTER — Ambulatory Visit
Admission: RE | Admit: 2016-01-18 | Discharge: 2016-01-18 | Disposition: A | Discharge status: Home / Self Care | Payer: Medicare Other | Source: Ambulatory Visit | Attending: Family Medicine | Admitting: Family Medicine

## 2016-01-18 DIAGNOSIS — Z85118 Personal history of other malignant neoplasm of bronchus and lung: Secondary | ICD-10-CM

## 2016-01-18 DIAGNOSIS — J439 Emphysema, unspecified: Secondary | ICD-10-CM | POA: Diagnosis not present

## 2016-01-19 DIAGNOSIS — G8929 Other chronic pain: Secondary | ICD-10-CM | POA: Diagnosis not present

## 2016-01-19 DIAGNOSIS — M5442 Lumbago with sciatica, left side: Secondary | ICD-10-CM | POA: Diagnosis not present

## 2016-01-19 DIAGNOSIS — M5136 Other intervertebral disc degeneration, lumbar region: Secondary | ICD-10-CM | POA: Diagnosis not present

## 2016-01-24 ENCOUNTER — Encounter: Payer: Self-pay | Admitting: Cardiology

## 2016-01-24 ENCOUNTER — Ambulatory Visit (INDEPENDENT_AMBULATORY_CARE_PROVIDER_SITE_OTHER): Payer: Medicare Other | Admitting: Cardiology

## 2016-01-24 VITALS — BP 132/68 | HR 80 | Ht 67.0 in | Wt 247.0 lb

## 2016-01-24 DIAGNOSIS — Z720 Tobacco use: Secondary | ICD-10-CM

## 2016-01-24 DIAGNOSIS — Z79899 Other long term (current) drug therapy: Secondary | ICD-10-CM | POA: Diagnosis not present

## 2016-01-24 DIAGNOSIS — E785 Hyperlipidemia, unspecified: Secondary | ICD-10-CM

## 2016-01-24 DIAGNOSIS — E7800 Pure hypercholesterolemia, unspecified: Secondary | ICD-10-CM | POA: Diagnosis not present

## 2016-01-24 DIAGNOSIS — I2583 Coronary atherosclerosis due to lipid rich plaque: Secondary | ICD-10-CM

## 2016-01-24 DIAGNOSIS — I480 Paroxysmal atrial fibrillation: Secondary | ICD-10-CM | POA: Diagnosis not present

## 2016-01-24 DIAGNOSIS — J45909 Unspecified asthma, uncomplicated: Secondary | ICD-10-CM | POA: Diagnosis not present

## 2016-01-24 DIAGNOSIS — F17210 Nicotine dependence, cigarettes, uncomplicated: Secondary | ICD-10-CM

## 2016-01-24 DIAGNOSIS — Z885 Allergy status to narcotic agent status: Secondary | ICD-10-CM | POA: Diagnosis not present

## 2016-01-24 DIAGNOSIS — D492 Neoplasm of unspecified behavior of bone, soft tissue, and skin: Secondary | ICD-10-CM | POA: Diagnosis not present

## 2016-01-24 DIAGNOSIS — I10 Essential (primary) hypertension: Secondary | ICD-10-CM | POA: Diagnosis not present

## 2016-01-24 DIAGNOSIS — I2510 Atherosclerotic heart disease of native coronary artery without angina pectoris: Secondary | ICD-10-CM | POA: Diagnosis not present

## 2016-01-24 DIAGNOSIS — Z7982 Long term (current) use of aspirin: Secondary | ICD-10-CM | POA: Diagnosis not present

## 2016-01-24 DIAGNOSIS — E119 Type 2 diabetes mellitus without complications: Secondary | ICD-10-CM | POA: Diagnosis not present

## 2016-01-24 DIAGNOSIS — F17200 Nicotine dependence, unspecified, uncomplicated: Secondary | ICD-10-CM | POA: Diagnosis not present

## 2016-01-24 DIAGNOSIS — Z7984 Long term (current) use of oral hypoglycemic drugs: Secondary | ICD-10-CM | POA: Diagnosis not present

## 2016-01-24 DIAGNOSIS — Z888 Allergy status to other drugs, medicaments and biological substances status: Secondary | ICD-10-CM | POA: Diagnosis not present

## 2016-01-24 NOTE — Assessment & Plan Note (Signed)
Continue statin.

## 2016-01-24 NOTE — Assessment & Plan Note (Signed)
Patient remains in sinus rhythm. Previous atrial fibrillation occurred in the setting of pulmonary embolus.

## 2016-01-24 NOTE — Patient Instructions (Signed)
Medication Instructions:   NO CHANGE  Testing/Procedures:  Your physician has requested that you have a lexiscan myoview. For further information please visit [email or web address]. Please follow instruction sheet, as given.    Follow-Up:  Your physician wants you to follow-up in: [place] will receive a reminder letter in the mail two months in advance. If you don't receive a letter, please call our office to schedule the follow-up appointment.

## 2016-01-24 NOTE — Assessment & Plan Note (Signed)
Patient counseled on discontinuing. 

## 2016-01-24 NOTE — Assessment & Plan Note (Addendum)
Continue aspirin and statin. Patient noted to have significant calcium in coronaries on recent CT. He has dyspnea likely secondary to COPD. However we will arrange a nuclear study for risk stratification.

## 2016-01-24 NOTE — Assessment & Plan Note (Signed)
Blood pressure controlled. Continue present medications. 

## 2016-01-24 NOTE — Progress Notes (Signed)
HPI: FU atrial fibrillation. Patient states had PCI of LAD in 1988. Last myoview in August of 2013 showed an ejection fraction of 72%. There was inferobasal thinning but no ischemia. Patient previously admitted with a pulmonary embolus. During hospitalization he developed atrial fibrillation with a rapid ventricular response. Troponins were negative. Echocardiogram in May of 2013 showed normal LV function and mild left ventricular hypertrophy. Patient treated with xeralto and cardizem and DCed. TSH in July 2013 1.91. Venous dopplers in June of 2013 with no DVT. Since I last saw him, He has dyspnea on exertion. No orthopnea, PND, palpitations, syncope or chest pain.He continues to smoke.  Current Outpatient Prescriptions  Medication Sig Dispense Refill  . acetaminophen (TYLENOL) 500 MG tablet Take 1,000 mg by mouth every 6 (six) hours as needed for pain.     Marland Kitchen ADVAIR DISKUS 250-50 MCG/DOSE AEPB Inhale 1 puff into the lungs 2 (two) times daily.   3  . albuterol (PROAIR HFA) 108 (90 BASE) MCG/ACT inhaler Inhale 2 puffs into the lungs every 6 (six) hours as needed for wheezing or shortness of breath.    Marland Kitchen aspirin EC 81 MG tablet Take 1 tablet (81 mg total) by mouth daily. 90 tablet 3  . calcium carbonate (OS-CAL) 600 MG TABS tablet Take 600 mg by mouth 2 (two) times daily with a meal.    . fexofenadine (ALLEGRA) 180 MG tablet Take 180 mg by mouth daily.      . fluticasone (FLONASE) 50 MCG/ACT nasal spray Place 2 sprays into the nose daily.      Marland Kitchen gabapentin (NEURONTIN) 300 MG capsule Take 6 capsules by mouth at bedtime 540 capsule 3  . glimepiride (AMARYL) 4 MG tablet Take 2 mg by mouth 2 (two) times daily.     Marland Kitchen guaiFENesin (MUCINEX) 600 MG 12 hr tablet Take 1,200 mg by mouth daily.    . hydrochlorothiazide (HYDRODIURIL) 12.5 MG tablet Take 12.5 mg by mouth daily.      Marland Kitchen HYDROcodone-acetaminophen (NORCO) 10-325 MG tablet Take 1 tablet by mouth every 6 (six) hours as needed.    Marland Kitchen losartan  (COZAAR) 50 MG tablet Take 1 tablet (50 mg total) by mouth daily. 90 tablet 0  . metFORMIN (GLUCOPHAGE) 1000 MG tablet Take 1,000 mg by mouth 2 (two) times daily with a meal.      . metoCLOPramide (REGLAN) 10 MG tablet Take 10 mg by mouth 2 (two) times daily.    . Multiple Vitamin (MULTIVITAMIN) tablet Take 1 tablet by mouth daily. Centrum silver     . Multiple Vitamins-Minerals (CENTRUM SILVER PO) Take by mouth daily.    . pioglitazone (ACTOS) 45 MG tablet Take 45 mg by mouth daily.      . pravastatin (PRAVACHOL) 40 MG tablet Take 1 tablet (40 mg total) by mouth daily. 90 tablet 3  . Vitamin D, Ergocalciferol, (DRISDOL) 50000 UNITS CAPS capsule Take 50,000 Units by mouth every 7 (seven) days.     No current facility-administered medications for this visit.     Past Medical History  Diagnosis Date  . Diabetes mellitus   . Hypertension   . COPD (chronic obstructive pulmonary disease) ([place])   . Asthma   . GERD (gastroesophageal reflux disease)   . Osteopenia   . Pulmonary embolus ([place])   . Atrial fibrillation ([place])   . Hyperlipidemia   . CAD (coronary artery disease)     Prior PTCA  . Nephrolithiasis   . Tremor, essential   .  lung ca dx'd 01/2006    Past Surgical History  Procedure Laterality Date  . Lung cancer surgery  01/2006    Small excision of left lung tissue; mesh with radioactive seeds (per pt report)  . Cholecystectomy    . Tonsillectomy    . Angioplasty  Approx 1993  . Cardiac catheterization  Approximately 1993  . Fixation kyphoplasty    . 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 Rotator cuff tear  2016    Social History   Social History  . Marital Status: Married    Spouse Name: Santiago Glad  . Number of Children: 2  . Years of Education: college   Occupational History  .      Retired from [employer] and [employer]  . Smoking status: Current Every Day Smoker -- 0.50 packs/day for 59 years    Types: Cigarettes  . Smokeless tobacco: Never Used  .  Alcohol Use: 0.6 oz/week    1 Glasses of wine per week     Comment: Rare  . Drug Use: No  . Sexual Activity: No   Other Topics Concern  . Not on file   Social History Narrative   Patient lives at home with his wife Santiago Glad).   Retired.   Education- College    Right handed.   Caffeine- three cups of coffee daily.    Family History  Problem Relation Age of Onset  . Heart disease Mother   . Tremor Mother   . Heart Problems Father   . Dementia Mother     ROS: Back pain but no fevers or chills, productive cough, hemoptysis, dysphasia, odynophagia, melena, hematochezia, dysuria, hematuria, rash, seizure activity, orthopnea, PND, pedal edema, claudication. Remaining systems are negative.  Physical Exam: Well-developed obese in no acute distress.  Skin is warm and dry.  HEENT is normal.  Neck is supple.  Chest With diminished breath sounds and mild expiratory wheeze Cardiovascular exam is regular rate and rhythm.  Abdominal exam nontender or distended. No masses palpated. Extremities show no edema. neuro grossly intact  ECG Sinus rhythm with occasional PAC. Right bundle branch block. Left axis deviation.

## 2016-01-25 DIAGNOSIS — M5416 Radiculopathy, lumbar region: Secondary | ICD-10-CM | POA: Diagnosis not present

## 2016-01-26 ENCOUNTER — Ambulatory Visit (INDEPENDENT_AMBULATORY_CARE_PROVIDER_SITE_OTHER): Payer: Medicare Other | Admitting: Internal Medicine

## 2016-01-26 ENCOUNTER — Encounter: Payer: Self-pay | Admitting: Internal Medicine

## 2016-01-26 VITALS — BP 112/70 | HR 68 | Ht 67.0 in | Wt 250.0 lb

## 2016-01-26 DIAGNOSIS — F17210 Nicotine dependence, cigarettes, uncomplicated: Secondary | ICD-10-CM

## 2016-01-26 DIAGNOSIS — J449 Chronic obstructive pulmonary disease, unspecified: Secondary | ICD-10-CM | POA: Diagnosis not present

## 2016-01-26 DIAGNOSIS — J9611 Chronic respiratory failure with hypoxia: Secondary | ICD-10-CM

## 2016-01-26 DIAGNOSIS — I2510 Atherosclerotic heart disease of native coronary artery without angina pectoris: Secondary | ICD-10-CM | POA: Diagnosis not present

## 2016-01-26 MED ORDER — BUDESONIDE-FORMOTEROL FUMARATE 160-4.5 MCG/ACT IN AERO: INHALATION_SPRAY | RESPIRATORY_TRACT | Status: DC

## 2016-01-26 NOTE — Progress Notes (Signed)
Subjective:    Patient ID: Paul [name], male    DOB: 1941/02/27  MRN: [id number]    Brief patient profile:  74yowm smoker with dx of copd with worse symptoms since 2013 referred to pulmonary clinic 12/24/2012 by Dr Darcus Austin for COPD evaluation with GOLD II criteria May 2014    History of Present Illness  12/24/2012 1st pulmonary eval  On advair/ spiriva longterm with freq use of proventil prior to dx  PE presenting as pleurtic L CP May 2013 with "moderate clot burden" to  [facility] assoc with PAF and started on Xarelto and baseline doe x climbing steps but could walk flat all day long before and after PE but then indolent onset doe progressively worse x 9 months assoc with cough congestion > green mucus better p on levaquin x one week but convinced his coughing and breathing problems are all due to xarelto because of what he read on the package. rec Only use your albuterol (proaire) as a rescue medication (plan B)  to be used if you can't catch your breath   The key is to stop smoking completely before smoking completely stops you     02/05/2013 f/u ov/Paul [name] cc need less albuterol maint on advair and spiriva with worse cough and sob x mailbox and back and collapse in chair.   Mucus turned green again x 2 weeks prior to OV   rec Stop advair and lisinopril Start symbicort 160 Take 2 puffs first thing in am and then another 2 puffs about 12 hours later.  Only use your albuterol(proaire)  as a rescue medication to be used if you can't catch your breath by resting or doing a relaxed purse lip breathing pattern. The less you use it, the better it will work when you need it.  Prednisone 10 mg take  4 each am x 2 days,   2 each am x 2 days,  1 each am x2days and stop  Levaquin 750 x 5 days For cough mucinex dm best as needed  03/26/2013 f/u ov/Paul [name] re copd Chief Complaint  Patient presents with  . Follow-up    Breathing is unchanged since the last visit. Ran out of symbicort so started back on  advair about 2 wks ago. He could not tell difference between the 2 meds, but spouse noted he did not cough as much on symbicort.  mucus is more day than night, usually not green rec Ok to stop spiriva Work on Engineer, technical sales technique:      06/21/2014 f/u ov/Paul [name] re: GOLD II COPD / still smoking, concerned re side effects of meds  Chief Complaint  Patient presents with  . Follow-up    Pt states had six minute walk test which was abnormal. He states that he was referred here to discuss this per Dr Darcus Austin. Pt was last seen here in May 2014 and states that his breathing is unchanged since then.     lots of phlegm and congestion each am and occ wakes at 3 am but mostly daytime. 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 Follow-up    Pt states that his cough and SOB are unchanged since the last visit. No new co's today.    no proair  Day of ov/ pacing himself and no longer doing steps so less sob/ less saba need - rattling cough esp in am, thick, slt green, not on max dose mucinex but seems to helps whereas even levaquin did not >> no changes , declined O2 .   03/07/2015 Follow up COPD /still smoking  Patient returns for a six-month follow-up. He says overall he has been doing okay He is currently a pulmonary rehabilitation and feels that it is really helping him. His O2 saturations do drop with exercise and he uses oxygen at pulmonary rehabilitation [facility] , he wears O2 with exercise and does much better.  O2 sat at rest are 95% walking. Today in office with drop with O2 saturation 88% on room air. Patient  denies any chest pain, palpitations, orthopnea, PND, or increased leg swelling. He does request that his oxygen orders be sent to [facility]  and is looking at the  portable concentrator unit-[employer].  He continues to smoke. We discussed cessation. He says he has no interest in quitting smoking. We discussed the dangers of oxygen and smoking. Patient was noted to have oxygen desaturations. Last visit and declined oxygen but now says that he is rated begin oxygen. rec Continue on current regimen  Work on not smoking  Begin Oxygen 2l/m with activity  Order sent to [name] for evaluation of portable concentrator >  [employer]ogen 2lpm pulse and 3lpm pulse when walk       09/05/2015  f/u ov/Paul [name] re: COPD II/ still smoking/ on advair 250 bid and rare saba at baseline  Chief Complaint  Patient presents with  . Follow-up    Pt c/o increased SOB and cough for the past wk. His cough is prod with green sputum and "tastes like clorox bleach".  baseline doe = MMRC2 = can't walk a nl pace on a flat grade s sob, worse with onset of cough and increase need for saba but still no more than twice daily rec The key is to stop smoking completely before smoking completely stops you!  Augmentin 875 mg take one pill twice daily  X 10 days - take at breakfast and supper with large glass of water.  It would help reduce the usual side effects (diarrhea and yeast infections) if you ate cultured yogurt at lunch.  Prednisone 10 mg take  4 each am x 2 days,   2 each am x 2 days,  1 each am x 2 days and stop    01/26/2016  f/u ov/Paul [name] re: GOLD II/ still smoking 02 3lpm on POC/ 2lpm on conc  Chief Complaint  Patient presents with  . Follow-up    Pt c/o cough with green sputum. He states that his breathing is progressively worse since last visit. He states that he can not go from the bedroom to the bathroom without having to use albuterol- using 6 x daily on average. Sats on RA at rest today 83%---increased to 93% on 3lpm pulsed  o2.   L cp under scapula up to hours then tylenol helps / not pleuritic x months  Mucus always discolored esp in am's then somewhat less so the rest of the day / no change with abx    No obvious day to day or daytime variability or assoc or chest tightness, subjective wheeze  or overt sinus or hb symptoms. No unusual exp hx or h/o childhood pna/ asthma or knowledge of premature birth.  Sleeping ok without nocturnal  or early am exacerbation  of respiratory  c/o's or need for noct saba. Also denies any obvious fluctuation of symptoms with weather or environmental changes or other aggravating or alleviating factors except as outlined above   Current Medications, Allergies, Complete Past Medical History, Past Surgical History, Family History, and Social History were reviewed in [employer] record.  ROS  The following are not active complaints unless bolded sore throat, dysphagia, dental problems, itching, sneezing,  nasal congestion or excess/ purulent secretions, ear ache,   fever, chills, sweats, unintended wt loss, classically pleuritic or exertional cp, hemoptysis,  orthopnea pnd or leg swelling, presyncope, palpitations, abdominal pain, anorexia, nausea, vomiting, diarrhea  or change in bowel or bladder habits, change in stools or urine, dysuria,hematuria,  rash, arthralgias, visual complaints, headache, numbnesweakness or ataxia or problems with walking or coordination,  change in mood/affect or memory.                    Objective:   Physical Exam  02/05/2013  Wt 266 > 03/26/2013  269 > 06/21/2014 261 > 08/04/2014  256 >257 03/07/2015 > 09/05/2015  254 > 01/26/2016  250   amb obese wm / vital signs reviewed   HEENT mild turbinate edema.  Oropharynx no thrush or excess pnd or cobblestoning.  No JVD or cervical adenopathy. Mild accessory muscle hypertrophy. Trachea midline, nl thryroid. Chest was hyperinflated by percussion with diminished breath sounds and moderate  increased exp time with dist bs insp and mid/late ex rhonchi bilaterally. Hoover sign positive at mid inspiration. Kyphosis  Regular rate and rhythm without murmur gallop or rub or increase P2 or edema.  Abd: no hsm, nl excursion. Ext warm without cyanosis or clubbing.         I personally reviewed images and agree with radiology impression as follows:  CT Chest   01/18/16 Status post left lower lobe resection with some mild volume loss and scarring on the left. No recurrent mass lesion is seen. No significant lymphadenopathy is noted.  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       Assessment & Plan:

## 2016-01-26 NOTE — Patient Instructions (Signed)
Plan A = Automatic = try off advair and start symbicort 160 2bid   Work on inhaler technique:  relax and gently blow all the way out then take a nice smooth deep breath back in, triggering the inhaler at same time you start breathing in.  Hold for up to 5 seconds if you can. Blow out thru nose. Rinse and gargle with water when done      Plan B = Backup Only use your albuterol as a rescue medication to be used if you can't catch your breath by resting or doing a relaxed purse lip breathing pattern.  - The less you use it, the better it will work when you need it. - Ok to use up to 2 puffs  every 4 hours if you must but call for appointment if use goes up over your usual need - Don't leave home without it !!  (think of it like the spare tire for your car)     The key is to stop smoking completely before smoking completely stops you!   Goal of 02 is to keep saturations above 90% at all times   Please schedule a follow up visit in 3  months but call sooner if needed

## 2016-01-30 NOTE — Assessment & Plan Note (Addendum)
-  PFT's 03/12/13 FEV1  2.27 (79%) ratio 52 and no better p B2 DLCO 45 and 60% p correction - Ex desats documented 06/21/14 > refused 02 > agreed to accept 03/26/15 see chronic resp failure   DDX of  difficult airways management almost all start with A and  include Adherence, Ace Inhibitors, Acid Reflux, Active Sinus Disease, Alpha 1 Antitripsin deficiency, Anxiety masquerading as Airways dz,  ABPA,  Allergy(esp in young), Aspiration (esp in elderly), Adverse effects of meds,  Active smokers, A bunch of PE's (a small clot burden can't cause this syndrome unless there is already severe underlying pulm or vascular dz with poor reserve) plus two Bs  = Bronchiectasis and Beta blocker use..and one C= CHF  Adherence is always the initial "prime suspect" and is a multilayered concern that requires a "trust but verify" approach in every patient - starting with knowing how to use medications, especially inhalers, correctly, keeping up with refills and understanding the fundamental difference between maintenance and prns vs those medications only taken for a very short course and then stopped and not refilled.  - The proper method of use, as well as anticipated side effects, of a metered-dose inhaler are discussed and demonstrated to the patient. Improved effectiveness after extensive coaching during this visit to a level of approximately 75 % from a baseline of 50 % > try symbicort hfa 160 2bid   Active smoking > (see separate a/p)   ? Adverse effects of advair > try off and on symb hfa   ? Active sinus dz suggested by am green mucus, did not respond to augmentin > sinus ct next step but pt says "it's always been this way" and not interested in w/u now  ? Allergies > rx flonase/ allegra   I had an extended discussion with the patient reviewing all relevant studies completed to date and  lasting 15 to 20 minutes of a 25 minute visit    Each maintenance medication was reviewed in detail including most  importantly the difference between maintenance and prns and under what circumstances the prns are to be triggered using an action plan format that is not reflected in the computer generated alphabetically organized AVS.    Please see instructions for details which were reviewed in writing and the patient given a copy highlighting the part that I personally wrote and discussed at today's ov.   Marland Kitchen

## 2016-01-30 NOTE — Assessment & Plan Note (Signed)
Complicated by dm, hbp/ hyperlipidemia  Body mass index is 39.15  Still trending up   Lab Results  Component Value Date   TSH 1.91 05/26/2012     Contributing to gerd tendency/ doe/reviewed the need and the process to achieve and maintain neg calorie balance > defer f/u primary care including intermittently monitoring thyroid status

## 2016-01-30 NOTE — Assessment & Plan Note (Signed)
04/05/15 SATURATION QUALIFICATIONS: (This note is used to comply with regulatory documentation for home oxygen) Patient Saturations on Room Air at Rest = 95% Patient Saturations on Room Air while Ambulating = 88% Patient Saturations on 2 Liters of oxygen while Ambulating = 93% Please briefly explain why patient needs home oxygen: desats with ambulation - 01/26/2016  Patient Saturations on Room Air at Rest =83%--increased to 93% 3lpm pulsed o2  As of 01/26/2016 rec  3lpm at rest/ sleep/ titrate to > 90% walking

## 2016-01-31 ENCOUNTER — Telehealth: Payer: Self-pay | Admitting: Internal Medicine

## 2016-01-31 NOTE — Telephone Encounter (Signed)
Spoke with pt and he states that Huey Romans has not received any information from his OV in order to recert his O2. Called [facility] and spoke with [place] who advised that we could fax the OV notes for the recert. Notes faxed to [phone]. Pt advised.

## 2016-02-07 ENCOUNTER — Telehealth (HOSPITAL_COMMUNITY): Payer: Self-pay

## 2016-02-07 NOTE — Telephone Encounter (Signed)
Encounter complete. 

## 2016-02-08 DIAGNOSIS — G8929 Other chronic pain: Secondary | ICD-10-CM | POA: Diagnosis not present

## 2016-02-08 DIAGNOSIS — M5442 Lumbago with sciatica, left side: Secondary | ICD-10-CM | POA: Diagnosis not present

## 2016-02-08 DIAGNOSIS — M5136 Other intervertebral disc degeneration, lumbar region: Secondary | ICD-10-CM | POA: Diagnosis not present

## 2016-02-09 ENCOUNTER — Ambulatory Visit (HOSPITAL_COMMUNITY)
Admission: RE | Admit: 2016-02-09 | Discharge: 2016-02-09 | Disposition: A | Discharge status: Home / Self Care | Payer: Medicare Other | Source: Ambulatory Visit | Attending: Cardiology | Admitting: Cardiology

## 2016-02-09 DIAGNOSIS — E119 Type 2 diabetes mellitus without complications: Secondary | ICD-10-CM | POA: Diagnosis not present

## 2016-02-09 DIAGNOSIS — I10 Essential (primary) hypertension: Secondary | ICD-10-CM | POA: Insufficient documentation

## 2016-02-09 DIAGNOSIS — R079 Chest pain, unspecified: Secondary | ICD-10-CM | POA: Diagnosis not present

## 2016-02-09 DIAGNOSIS — Z8249 Family history of ischemic heart disease and other diseases of the circulatory system: Secondary | ICD-10-CM | POA: Diagnosis not present

## 2016-02-09 DIAGNOSIS — E669 Obesity, unspecified: Secondary | ICD-10-CM | POA: Insufficient documentation

## 2016-02-09 DIAGNOSIS — R0609 Other forms of dyspnea: Secondary | ICD-10-CM | POA: Diagnosis not present

## 2016-02-09 DIAGNOSIS — Z720 Tobacco use: Secondary | ICD-10-CM | POA: Insufficient documentation

## 2016-02-09 DIAGNOSIS — Z6839 Body mass index (BMI) 39.0-39.9, adult: Secondary | ICD-10-CM | POA: Diagnosis not present

## 2016-02-09 DIAGNOSIS — I2510 Atherosclerotic heart disease of native coronary artery without angina pectoris: Secondary | ICD-10-CM | POA: Diagnosis not present

## 2016-02-09 DIAGNOSIS — R9439 Abnormal result of other cardiovascular function study: Secondary | ICD-10-CM | POA: Insufficient documentation

## 2016-02-09 DIAGNOSIS — I2583 Coronary atherosclerosis due to lipid rich plaque: Secondary | ICD-10-CM

## 2016-02-09 LAB — MYOCARDIAL PERFUSION IMAGING
Peak HR: 95 {beats}/min
Rest HR: 81 {beats}/min

## 2016-02-09 MED ORDER — REGADENOSON 0.4 MG/5ML IV SOLN
0.4000 mg | Freq: Once | INTRAVENOUS | Status: AC
  Administered 2016-02-09: 0.4 mg via INTRAVENOUS

## 2016-02-09 MED ORDER — TECHNETIUM TC 99M SESTAMIBI GENERIC - CARDIOLITE
30.5000 | Freq: Once | INTRAVENOUS | Status: AC | PRN
  Administered 2016-02-09: 31 via INTRAVENOUS

## 2016-02-09 MED ORDER — TECHNETIUM TC 99M SESTAMIBI GENERIC - CARDIOLITE
10.0000 | Freq: Once | INTRAVENOUS | Status: AC | PRN
  Administered 2016-02-09: 10 via INTRAVENOUS

## 2016-02-09 MED ORDER — AMINOPHYLLINE 25 MG/ML IV SOLN
100.0000 mg | Freq: Once | INTRAVENOUS | Status: AC
  Administered 2016-02-09: 100 mg via INTRAVENOUS

## 2016-02-20 DIAGNOSIS — M810 Age-related osteoporosis without current pathological fracture: Secondary | ICD-10-CM | POA: Diagnosis not present

## 2016-02-22 ENCOUNTER — Ambulatory Visit: Payer: Medicare Other | Admitting: Podiatry

## 2016-03-01 DIAGNOSIS — E1165 Type 2 diabetes mellitus with hyperglycemia: Secondary | ICD-10-CM | POA: Diagnosis not present

## 2016-03-07 ENCOUNTER — Ambulatory Visit (INDEPENDENT_AMBULATORY_CARE_PROVIDER_SITE_OTHER): Payer: Medicare Other | Admitting: Podiatry

## 2016-03-07 ENCOUNTER — Encounter: Payer: Self-pay | Admitting: Podiatry

## 2016-03-07 DIAGNOSIS — B351 Tinea unguium: Secondary | ICD-10-CM

## 2016-03-07 DIAGNOSIS — E1140 Type 2 diabetes mellitus with diabetic neuropathy, unspecified: Secondary | ICD-10-CM

## 2016-03-07 DIAGNOSIS — M79609 Pain in unspecified limb: Principal | ICD-10-CM

## 2016-03-07 NOTE — Patient Instructions (Signed)
Diabetes and Foot Care Diabetes may cause you to have problems because of poor blood supply (circulation) to your feet and legs. This may cause the skin on your feet to become thinner, break easier, and heal more slowly. Your skin may become dry, and the skin may peel and crack. You may also have nerve damage in your legs and feet causing decreased feeling in them. You may not notice minor injuries to your feet that could lead to infections or more serious problems. Taking care of your feet is one of the most important things you can do for yourself.  HOME CARE INSTRUCTIONS  Wear shoes at all times, even in the house. Do not go barefoot. Bare feet are easily injured.  Check your feet daily for blisters, cuts, and redness. If you cannot see the bottom of your feet, use a mirror or ask someone for help.  Wash your feet with warm water (do not use hot water) and mild soap. Then pat your feet and the areas between your toes until they are completely dry. Do not soak your feet as this can dry your skin.  Apply a moisturizing lotion or petroleum jelly (that does not contain alcohol and is unscented) to the skin on your feet and to dry, brittle toenails. Do not apply lotion between your toes.  Trim your toenails straight across. Do not dig under them or around the cuticle. File the edges of your nails with an emery board or nail file.  Do not cut corns or calluses or try to remove them with medicine.  Wear clean socks or stockings every day. Make sure they are not too tight. Do not wear knee-high stockings since they may decrease blood flow to your legs.  Wear shoes that fit properly and have enough cushioning. To break in new shoes, wear them for just a few hours a day. This prevents you from injuring your feet. Always look in your shoes before you put them on to be sure there are no objects inside.  Do not cross your legs. This may decrease the blood flow to your feet.  If you find a minor scrape,  cut, or break in the skin on your feet, keep it and the skin around it clean and dry. These areas may be cleansed with mild soap and water. Do not cleanse the area with peroxide, alcohol, or iodine.  When you remove an adhesive bandage, be sure not to damage the skin around it.  If you have a wound, look at it several times a day to make sure it is healing.  Do not use heating pads or hot water bottles. They may burn your skin. If you have lost feeling in your feet or legs, you may not know it is happening until it is too late.  Make sure your health care provider performs a complete foot exam at least annually or more often if you have foot problems. Report any cuts, sores, or bruises to your health care provider immediately. SEEK MEDICAL CARE IF:   You have an injury that is not healing.  You have cuts or breaks in the skin.  You have an ingrown nail.  You notice redness on your legs or feet.  You feel burning or tingling in your legs or feet.  You have pain or cramps in your legs and feet.  Your legs or feet are numb.  Your feet always feel cold. SEEK IMMEDIATE MEDICAL CARE IF:   There is increasing redness,   swelling, or pain in or around a wound.  There is a red line that goes up your leg.  Pus is coming from a wound.  You develop a fever or as directed by your health care provider.  You notice a bad smell coming from an ulcer or wound.   This information is not intended to replace advice given to you by your health care provider. Make sure you discuss any questions you have with your health care provider.   Document Released: 10/26/2000 Document Revised: 07/01/2013 Document Reviewed: 04/07/2013 Elsevier Interactive Patient Education 2016 [employer]vier Inc.  

## 2016-03-08 NOTE — Progress Notes (Signed)
Patient ID: Paul [name], male   DOB: [birth year], 75 y.o.   MRN: [id number]  Subjective: This patient presents again for a scheduled visit complaining of toenails that are thickened and elongated and are uncomfortable when he walks wear shoes and requests toenail debridement Is a type II diabetic denies any history of foot ulceration, claudication or amputation  Objective: Orientated 3 DP and PT pulses 1/4 bilaterally Capillary reflex immediate bilaterally  Neurological: Sensation to 10 g monofilament wire intact 0/5 right and 1/5 left Vibratory sensation nonreactive bilaterally Ankle reflex equal and reactive bilaterally  Dermatological: No open skin lesions bilaterally Atrophic skin bilaterally The toenails are elongated, brittle, hypertrophic, deformed and are tender to direct palpation  Musculoskeletal: Pes planus bilaterally  Assessment: 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

## 2016-03-09 ENCOUNTER — Other Ambulatory Visit (HOSPITAL_COMMUNITY): Payer: Self-pay | Admitting: *Deleted

## 2016-03-12 ENCOUNTER — Ambulatory Visit (HOSPITAL_COMMUNITY)
Admission: RE | Admit: 2016-03-12 | Discharge: 2016-03-12 | Disposition: A | Discharge status: Home / Self Care | Payer: Medicare Other | Source: Ambulatory Visit | Attending: Family Medicine | Admitting: Family Medicine

## 2016-03-12 DIAGNOSIS — M810 Age-related osteoporosis without current pathological fracture: Secondary | ICD-10-CM | POA: Diagnosis not present

## 2016-03-12 MED ORDER — ZOLEDRONIC ACID 5 MG/100ML IV SOLN
5.0000 mg | Freq: Once | INTRAVENOUS | Status: AC
  Administered 2016-03-12: 5 mg via INTRAVENOUS

## 2016-03-12 NOTE — Progress Notes (Signed)
Patient ID: Paul [name], male   DOB: 20-Jul-1941, 75 y.o.   MRN: [id number]   SEVERO BEBER was contacted via phone to inform him that [facility] does not dispense hearing aids.  From his history, he has concerns about hearing loss and tinnitus.  Since tinnitus treatment or tinnitus maskers are part of hearing amplification it is in [employer] best interest to go to a facility that has hearing testing as well as amplification capability.  Several places in town were recommended.  Atul Lindajo Royal has "completed extensive research" and only wanted to come here or to [place].    Ronelle Smallman Ku plans to call [facility] for an appointment (Tel [phone], [place] [phone]).  He would like Marjorie Smolder, MD to submit a referral for the hearing test, hearing aids evaluation of tinnitus there.  Thank you, Neoma Laming L. Heide Spark, Au.D., CCC-A Doctor of Audiology 03/12/2016

## 2016-03-28 DIAGNOSIS — Z822 Family history of deafness and hearing loss: Secondary | ICD-10-CM | POA: Diagnosis not present

## 2016-03-28 DIAGNOSIS — H903 Sensorineural hearing loss, bilateral: Secondary | ICD-10-CM | POA: Diagnosis not present

## 2016-03-28 DIAGNOSIS — H9313 Tinnitus, bilateral: Secondary | ICD-10-CM | POA: Diagnosis not present

## 2016-04-05 DIAGNOSIS — I70209 Unspecified atherosclerosis of native arteries of extremities, unspecified extremity: Secondary | ICD-10-CM | POA: Diagnosis not present

## 2016-04-05 DIAGNOSIS — M5136 Other intervertebral disc degeneration, lumbar region: Secondary | ICD-10-CM | POA: Diagnosis not present

## 2016-04-12 DIAGNOSIS — Z822 Family history of deafness and hearing loss: Secondary | ICD-10-CM | POA: Diagnosis not present

## 2016-04-12 DIAGNOSIS — H903 Sensorineural hearing loss, bilateral: Secondary | ICD-10-CM | POA: Diagnosis not present

## 2016-04-12 DIAGNOSIS — H9313 Tinnitus, bilateral: Secondary | ICD-10-CM | POA: Diagnosis not present

## 2016-04-16 DIAGNOSIS — F17210 Nicotine dependence, cigarettes, uncomplicated: Secondary | ICD-10-CM | POA: Diagnosis not present

## 2016-04-16 DIAGNOSIS — E7800 Pure hypercholesterolemia, unspecified: Secondary | ICD-10-CM | POA: Diagnosis not present

## 2016-04-16 DIAGNOSIS — I10 Essential (primary) hypertension: Secondary | ICD-10-CM | POA: Diagnosis not present

## 2016-04-16 DIAGNOSIS — Z713 Dietary counseling and surveillance: Secondary | ICD-10-CM | POA: Diagnosis not present

## 2016-04-16 DIAGNOSIS — I4891 Unspecified atrial fibrillation: Secondary | ICD-10-CM | POA: Diagnosis not present

## 2016-04-16 DIAGNOSIS — Z7984 Long term (current) use of oral hypoglycemic drugs: Secondary | ICD-10-CM | POA: Diagnosis not present

## 2016-04-16 DIAGNOSIS — E1165 Type 2 diabetes mellitus with hyperglycemia: Secondary | ICD-10-CM | POA: Diagnosis not present

## 2016-04-16 DIAGNOSIS — Z6839 Body mass index (BMI) 39.0-39.9, adult: Secondary | ICD-10-CM | POA: Diagnosis not present

## 2016-04-30 ENCOUNTER — Encounter: Payer: Self-pay | Admitting: Internal Medicine

## 2016-04-30 ENCOUNTER — Ambulatory Visit (INDEPENDENT_AMBULATORY_CARE_PROVIDER_SITE_OTHER): Payer: Medicare Other | Admitting: Internal Medicine

## 2016-04-30 VITALS — BP 104/60 | HR 87 | Wt 250.0 lb

## 2016-04-30 DIAGNOSIS — I2510 Atherosclerotic heart disease of native coronary artery without angina pectoris: Secondary | ICD-10-CM

## 2016-04-30 DIAGNOSIS — F17210 Nicotine dependence, cigarettes, uncomplicated: Secondary | ICD-10-CM | POA: Diagnosis not present

## 2016-04-30 DIAGNOSIS — Z720 Tobacco use: Secondary | ICD-10-CM

## 2016-04-30 DIAGNOSIS — J449 Chronic obstructive pulmonary disease, unspecified: Secondary | ICD-10-CM | POA: Diagnosis not present

## 2016-04-30 DIAGNOSIS — J9611 Chronic respiratory failure with hypoxia: Secondary | ICD-10-CM

## 2016-04-30 NOTE — Assessment & Plan Note (Signed)
04/05/15 SATURATION QUALIFICATIONS: (This note is used to comply with regulatory documentation for home oxygen) Patient Saturations on Room Air at Rest = 95% Patient Saturations on Room Air while Ambulating = 88% Patient Saturations on 2 Liters of oxygen while Ambulating = 93% Please briefly explain why patient needs home oxygen: desats with ambulation - 01/26/2016  Patient Saturations on Room Air at Rest =83%--increased to 93% 3lpm pulsed o2  As of 04/30/2016 rec  3lpm at rest/ sleep/ titrate to > 90% walking

## 2016-04-30 NOTE — Progress Notes (Signed)
Subjective:    Patient ID: Paul [name], male    DOB: 01/02/1941  MRN: [id number]    Brief patient profile:  74yowm smoker with dx of copd with worse symptoms since 2013 referred to pulmonary clinic 12/24/2012 by Dr Darcus Austin for COPD evaluation with GOLD II criteria May 2014    History of Present Illness  12/24/2012 1st pulmonary eval  On advair/ spiriva longterm with freq use of proventil prior to dx  PE presenting as pleurtic L CP May 2013 with "moderate clot burden" to  [facility] assoc with PAF and started on Xarelto and baseline doe x climbing steps but could walk flat all day long before and after PE but then indolent onset doe progressively worse x 9 months assoc with cough congestion > green mucus better p on levaquin x one week but convinced his coughing and breathing problems are all due to xarelto because of what he read on the package. rec Only use your albuterol (proaire) as a rescue medication (plan B)  to be used if you can't catch your breath   The key is to stop smoking completely before smoking completely stops you     02/05/2013 f/u ov/Paul [name] cc need less albuterol maint on advair and spiriva with worse cough and sob x mailbox and back and collapse in chair.   Mucus turned green again x 2 weeks prior to OV   rec Stop advair and lisinopril Start symbicort 160 Take 2 puffs first thing in am and then another 2 puffs about 12 hours later.  Only use your albuterol(proaire)  as a rescue medication to be used if you can't catch your breath by resting or doing a relaxed purse lip breathing pattern. The less you use it, the better it will work when you need it.  Prednisone 10 mg take  4 each am x 2 days,   2 each am x 2 days,  1 each am x2days and stop  Levaquin 750 x 5 days For cough mucinex dm best as needed  03/26/2013 f/u ov/Paul [name] re copd Chief Complaint  Patient presents with  . Follow-up    Breathing is unchanged since the last visit. Ran out of symbicort so started back on  advair about 2 wks ago. He could not tell difference between the 2 meds, but spouse noted he did not cough as much on symbicort.  mucus is more day than night, usually not green rec Ok to stop spiriva Work on Engineer, technical sales technique:      06/21/2014 f/u ov/Paul [name] re: GOLD II COPD / still smoking, concerned re side effects of meds  Chief Complaint  Patient presents with  . Follow-up    Pt states had six minute walk test which was abnormal. He states that he was referred here to discuss this per Dr Darcus Austin. Pt was last seen here in May 2014 and states that his breathing is unchanged since then.     lots of phlegm and congestion each am and occ wakes at 3 am but mostly daytime. 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 Follow-up    Pt states that his cough and SOB are unchanged since the last visit. No new co's today.    no proair  Day of ov/ pacing himself and no longer doing steps so less sob/ less saba need - rattling cough esp in am, thick, slt green, not on max dose mucinex but seems to helps whereas even levaquin did not >> no changes , declined O2 .   03/07/2015 Follow up COPD /still smoking  Patient returns for a six-month follow-up. He says overall he has been doing okay He is currently a pulmonary rehabilitation and feels that it is really helping him. His O2 saturations do drop with exercise and he uses oxygen at pulmonary rehabilitation [facility] , he wears O2 with exercise and does much better.  O2 sat at rest are 95% walking. Today in office with drop with O2 saturation 88% on room air. Patient  denies any chest pain, palpitations, orthopnea, PND, or increased leg swelling. He does request that his oxygen orders be sent to [facility]  and is looking at the  portable concentrator unit-Innogen.  He continues to smoke. We discussed cessation. He says he has no interest in quitting smoking. We discussed the dangers of oxygen and smoking. Patient was noted to have oxygen desaturations. Last visit and declined oxygen but now says that he is rated begin oxygen. rec Continue on current regimen  Work on not smoking  Begin Oxygen 2l/m with activity  Order sent to Paul [name] for evaluation of portable concentrator >  Inogen 2lpm pulse and 3lpm pulse when walk       09/05/2015  f/u ov/Paul [name] re: COPD II/ still smoking/ on advair 250 bid and rare saba at baseline  Chief Complaint  Patient presents with  . Follow-up    Pt c/o increased SOB and cough for the past wk. His cough is prod with green sputum and "tastes like clorox bleach".  baseline doe = MMRC2 = can't walk a nl pace on a flat grade s sob, worse with onset of cough and increase need for saba but still no more than twice daily rec The key is to stop smoking completely before smoking completely stops you!  Augmentin 875 mg take one pill twice daily  X 10 days - take at breakfast and supper with large glass of water.  It would help reduce the usual side effects (diarrhea and yeast infections) if you ate cultured yogurt at lunch.  Prednisone 10 mg take  4 each am x 2 days,   2 each am x 2 days,  1 each am x 2 days and stop    01/26/2016  f/u ov/Paul [name] re: GOLD II/ still smoking 02 3lpm on POC/ 2lpm on conc  Chief Complaint  Patient presents with  . Follow-up    Pt c/o cough with green sputum. He states that his breathing is progressively worse since last visit. He states that he can not go from the bedroom to the bathroom without having to use albuterol- using 6 x daily on average. Sats on RA at rest today 83%---increased to 93% on 3lpm pulsed  o2.   L cp under scapula up to hours then tylenol helps / not pleuritic x months  Mucus always discolored esp in am's then somewhat less so the rest of the day / no change with abx  rec Plan A = Automatic = try off advair and start symbicort 160 2bid  Work on  inhaler technique:  relax and gently blow all the way out then take a nice smooth deep breath back in, triggering the inhaler at same time you start breathing in.  Hold for up to 5 seconds if you can. Blow out thru nose. Rinse and gargle with water when done Plan B = Backup Only use your albuterol as a rescue medication  The key is to stop smoking completely before smoking completely stops you!  Goal of 02 is to keep saturations above 90% at all times     04/30/2016  f/u ov/Paul [name] re: GOLD 2 still smoking/ symbicort  Chief Complaint  Patient presents with  . Follow-up    Breathing is unchanged. No new co's. He is using albuterol inhaler 2 x daily on average.   wearing 3lpm with walking and nothing at rest with ok sats and 2lpm at hs s noct saba  MMRC3 = can't walk 100 yards even at a slow pace at a flat grade s stopping due to sob  Even on 3lpm    No obvious day to day or daytime variability or assoc excess/ purulent sputum or mucus plugs   or chest tightness, subjective wheeze or overt sinus or hb symptoms. No unusual exp hx or h/o childhood pna/ asthma or knowledge of premature birth.  Sleeping ok without nocturnal  or early am exacerbation  of respiratory  c/o's or need for noct saba. Also denies any obvious fluctuation of symptoms with weather or environmental changes or other aggravating or alleviating factors except as outlined above   Current Medications, Allergies, Complete Past Medical History, Past Surgical History, Family History, and Social History were reviewed in [employer] record.  ROS  The following are not active complaints unless bolded sore throat, dysphagia, dental problems, itching, sneezing,   nasal congestion or excess/ purulent secretions, ear ache,   fever, chills, sweats, unintended wt loss, classically pleuritic or exertional cp, hemoptysis,  orthopnea pnd or leg swelling, presyncope, palpitations, abdominal pain, anorexia, nausea, vomiting, diarrhea  or change in bowel or bladder habits, change in stools or urine, dysuria,hematuria,  rash, arthralgias, visual complaints, headache, numbnesweakness or ataxia or problems with walking or coordination,  change in mood/affect or memory.                    Objective:   Physical Exam  02/05/2013  Wt 266 > 03/26/2013  269 > 06/21/2014 261 > 08/04/2014  256 >257 03/07/2015 > 09/05/2015  254 > 01/26/2016  250   amb obese wm / vital signs reviewed   HEENT mild turbinate edema.  Oropharynx no thrush or excess pnd or cobblestoning.  No JVD or cervical adenopathy. Mild accessory muscle hypertrophy. Trachea midline, nl thryroid. Chest was hyperinflated by percussion with diminished breath sounds and moderate increased exp time with distamt bs with minimal end exp rhonchi bilaterally. Hoover sign positive at mid inspiration. Kyphosis  Regular rate and rhythm without murmur gallop or rub or increase P2 or edema.  Abd: no hsm, nl excursion. Ext warm without cyanosis or clubbing.         I personally reviewed images and agree with radiology impression as follows:  CT Chest   01/18/16 Status post left lower lobe resection with some mild volume loss and scarring on the left. No recurrent mass lesion is seen. No significant lymphadenopathy is noted.  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  Assessment & Plan:

## 2016-04-30 NOTE — Patient Instructions (Addendum)
No change in medications or 02 recs  The key is to stop smoking completely before smoking completely stops you - it's the most important aspect of your care    Please schedule a follow up visit in 3 months but call sooner if needed with pfts on return

## 2016-04-30 NOTE — Assessment & Plan Note (Signed)
-  PFT's 03/12/13 FEV1  2.27 (79%) ratio 52 and no better p B2 DLCO 45 and 60% p correction - Ex desats documented 06/21/14 > refused 02 > agreed to accept 03/26/15 see chronic resp failure  - 01/26/2016  ry symbicort instead of advair - 04/30/2016  After extensive coaching HFA effectiveness =    90%   Obesity and active smoking problematic but no change in rx needed pending return for f/u full pfts  Each maintenance medication was reviewed in detail including most importantly the difference between maintenance and as needed and under what circumstances the prns are to be used.  Please see instructions for details which were reviewed in writing and the patient given a copy.

## 2016-04-30 NOTE — Assessment & Plan Note (Addendum)

## 2016-05-02 ENCOUNTER — Ambulatory Visit (INDEPENDENT_AMBULATORY_CARE_PROVIDER_SITE_OTHER): Payer: Medicare Other

## 2016-05-02 ENCOUNTER — Encounter: Payer: Self-pay | Admitting: Podiatry

## 2016-05-02 ENCOUNTER — Ambulatory Visit (INDEPENDENT_AMBULATORY_CARE_PROVIDER_SITE_OTHER): Payer: Medicare Other | Admitting: Podiatry

## 2016-05-02 VITALS — BP 138/74 | HR 70 | Resp 12

## 2016-05-02 DIAGNOSIS — M79672 Pain in left foot: Secondary | ICD-10-CM

## 2016-05-02 DIAGNOSIS — M216X2 Other acquired deformities of left foot: Secondary | ICD-10-CM

## 2016-05-02 DIAGNOSIS — I2510 Atherosclerotic heart disease of native coronary artery without angina pectoris: Secondary | ICD-10-CM | POA: Diagnosis not present

## 2016-05-02 NOTE — Progress Notes (Signed)
   Subjective:    Patient ID: Paul [name], male    DOB: 1941/06/30, 75 y.o.   MRN: [id number]  HPI   This patient presents today complaining of a painful left foot at the base of the first metatarsal cuneiform area aggravated with standing and walking and relieved with rest and elevation. He says because the left foot is very uncomfortable with weightbearing he has reduced the amount of standing walking to accommodate to the pain. There is a callus in the area of complaint and he's been applying Neosporin to the area. He denies any drainage from this area.  Patient is a type II diabetic and denies any history of claudication, amputation or foot ulceration   Review of Systems  Skin: Positive for color change.       Objective:   Physical Exam  Orientated 3  Vascular: No peripheral edema bilaterally No calf pain or calf edema bilaterally DP pulses 2/4 bilaterally PT pulses 2/4 bilaterally Capillary reflex immediate bilaterally  Neurological: Sensation to 10 g monofilament wire intact 1/5 right 2/5 left Vibratory sensation nonreactive bilaterally Ankle reflex equal and reactive bilaterally  Dermatological: No open skin lesions bilaterally Plantar callus base first metatarsocuneiform without any bleeding within callus  Musculoskeletal: Upon weight-bearing patient has extreme pes planus with the forefoot significantly abducted on the rear foot bilaterally Patient has slow unstable gait with maximum hyperpronation throughout the gait cycle There is palpable tenderness in or around the base of first metatarsal cuneiform which seems to duplicate patient's area of discomfort Patient has diabetic shoes with custom insoles that contour well and in a good state of repair     X-ray examination weightbearing left foot dated 05/02/2016  Intact bony structure without fracture and/or dislocation Decreased bone density in all views Significant degenerative changes mid foot and  Lisfranc articulations Forefoot abducted on the rear foot Plantarflexed talus Pes planus Third left metatarsal demonstrates previous fracture with healing  Radiographic impression an x-ray dated 05/02/2016 No acute bony abnormality Decreased bone density Midfoot degenerative changes Sagittal and transverse plane flatfoot deformity Healed third metatarsal fracture         Assessment & Plan:   Assessment: Diabetic with satisfactory vascular status Diabetic peripheral neuropathy Hyperpronation resulting in painful left foot Significant degenerative changes left foot Gait disturbance  Plan: At this time I reviewed the results of the exam and x-ray with patient today in detail. I made him aware that he has significant flatfoot deformity causing hyperpronation, ingrowing and pressure on the base of first metatarsocuneiform area. Because he has an unstable gait I am deferring on cast immobilization as a concern that this may possibly create a further unstable gait resulting in a fall. I attached an additional felt pad to further offload the base of the first metatarsocuneiform area on the custom shoe insole. He will continue wearing his diabetic shoes with a modified custom insole on the left foot. I advised patient to limit the amount of standing walking. I will reevaluate him in the next week and if the pain persists we'll have to consider more rest and immobilization as a possibility. Also, I would consider an [place] brace to stabilize the hyperpronation left foot.  Reappoint 7 days

## 2016-05-02 NOTE — Patient Instructions (Signed)
There is a 2 week history of an inflamed callus on the left foot associated with excessive inrolling (hyperpronation) The x-ray today demonstrated no active bone activity and confirm the hyperpronated foot. There is evidence of an old fracture in the metatarsal of this foot that has healed Pulses in your feet are adequate There is decreased feeling in your feet consistent with neuropathy I attached additional felt pad in the shoe insole to further offload this area. Please limit the amount of standing and walking and uses a walker Return 7 days for further evaluation  Diabetes and Foot Care Diabetes may cause you to have problems because of poor blood supply (circulation) to your feet and legs. This may cause the skin on your feet to become thinner, break easier, and heal more slowly. Your skin may become dry, and the skin may peel and crack. You may also have nerve damage in your legs and feet causing decreased feeling in them. You may not notice minor injuries to your feet that could lead to infections or more serious problems. Taking care of your feet is one of the most important things you can do for yourself.  HOME CARE INSTRUCTIONS  Wear shoes at all times, even in the house. Do not go barefoot. Bare feet are easily injured.  Check your feet daily for blisters, cuts, and redness. If you cannot see the bottom of your feet, use a mirror or ask someone for help.  Wash your feet with warm water (do not use hot water) and mild soap. Then pat your feet and the areas between your toes until they are completely dry. Do not soak your feet as this can dry your skin.  Apply a moisturizing lotion or petroleum jelly (that does not contain alcohol and is unscented) to the skin on your feet and to dry, brittle toenails. Do not apply lotion between your toes.  Trim your toenails straight across. Do not dig under them or around the cuticle. File the edges of your nails with an emery board or nail file.  Do  not cut corns or calluses or try to remove them with medicine.  Wear clean socks or stockings every day. Make sure they are not too tight. Do not wear knee-high stockings since they may decrease blood flow to your legs.  Wear shoes that fit properly and have enough cushioning. To break in new shoes, wear them for just a few hours a day. This prevents you from injuring your feet. Always look in your shoes before you put them on to be sure there are no objects inside.  Do not cross your legs. This may decrease the blood flow to your feet.  If you find a minor scrape, cut, or break in the skin on your feet, keep it and the skin around it clean and dry. These areas may be cleansed with mild soap and water. Do not cleanse the area with peroxide, alcohol, or iodine.  When you remove an adhesive bandage, be sure not to damage the skin around it.  If you have a wound, look at it several times a day to make sure it is healing.  Do not use heating pads or hot water bottles. They may burn your skin. If you have lost feeling in your feet or legs, you may not know it is happening until it is too late.  Make sure your health care provider performs a complete foot exam at least annually or more often if you have foot  problems. Report any cuts, sores, or bruises to your health care provider immediately. SEEK MEDICAL CARE IF:   You have an injury that is not healing.  You have cuts or breaks in the skin.  You have an ingrown nail.  You notice redness on your legs or feet.  You feel burning or tingling in your legs or feet.  You have pain or cramps in your legs and feet.  Your legs or feet are numb.  Your feet always feel cold. SEEK IMMEDIATE MEDICAL CARE IF:   There is increasing redness, swelling, or pain in or around a wound.  There is a red line that goes up your leg.  Pus is coming from a wound.  You develop a fever or as directed by your health care provider.  You notice a bad smell  coming from an ulcer or wound.   This information is not intended to replace advice given to you by your health care provider. Make sure you discuss any questions you have with your health care provider.   Document Released: 10/26/2000 Document Revised: 07/01/2013 Document Reviewed: 04/07/2013 Elsevier Interactive Patient Education [employer].

## 2016-05-09 ENCOUNTER — Ambulatory Visit (INDEPENDENT_AMBULATORY_CARE_PROVIDER_SITE_OTHER): Payer: Medicare Other | Admitting: Podiatry

## 2016-05-09 ENCOUNTER — Encounter: Payer: Self-pay | Admitting: Podiatry

## 2016-05-09 VITALS — BP 127/70 | HR 74 | Resp 12

## 2016-05-09 DIAGNOSIS — I2510 Atherosclerotic heart disease of native coronary artery without angina pectoris: Secondary | ICD-10-CM

## 2016-05-09 DIAGNOSIS — M216X2 Other acquired deformities of left foot: Secondary | ICD-10-CM

## 2016-05-09 NOTE — Progress Notes (Signed)
Subjective:    Patient ID: Paul [name], male    DOB: October 13, 1941, 75 y.o.   MRN: [id number]  HPI This patient presents today for follow-up visit for a painful plantar skin lesion on the left foot associated with hyperpronation and peripheral neuropathy. On the initial visit an additional felt pad was attached to the diabetic insole to further offload the [place] base of the first metatarsal cuneiform area on the left foot. Patient states that when he  He wears the diabetic shoes with the additional felt padding attached to the insole the discomfort has improved. He admits that at times he walks around the house without the shoe insole or a protective shoe which exacerbates the pain this area.   Review of Systems  Skin: Positive for color change.       Objective:   Physical Exam  Orientated 3  Vascular: No peripheral edema bilaterally No calf pain or calf edema bilaterally DP pulses 2/4 bilaterally PT pulses 2/4 bilaterally Capillary reflex immediate bilaterally  Neurological: Sensation to 10 g monofilament wire intact 1/5 right 2/5 left Vibratory sensation nonreactive bilaterally Ankle reflex equal and reactive bilaterally  Dermatological: No open skin lesions bilaterally Plantar callus base first metatarsocuneiform without any bleeding within callus  there is no erythema or warmth in this callused area  Musculoskeletal: Upon weight-bearing patient has extreme pes planus with the forefoot significantly abducted on the rear foot bilaterally Patient has slow unstable gait with maximum hyperpronation throughout the gait cycle There is palpable tenderness in or around the base of first metatarsal cuneiform which seems to duplicate patient's area of discomfort Patient has diabetic shoes with custom insoles that contour well and in a good state of repair   X-ray examination weightbearing left foot dated 05/02/2016  Intact bony structure without fracture and/or  dislocation Decreased bone density in all views Significant degenerative changes mid foot and Lisfranc articulations Forefoot abducted on the rear foot Plantarflexed talus Pes planus Third left metatarsal demonstrates previous fracture with healing  Radiographic impression an x-ray dated 05/02/2016 No acute bony abnormality Decreased bone density Midfoot degenerative changes Sagittal and transverse plane flatfoot deformity Healed third metatarsal fracture             Assessment & Plan:   Assessment: Diabetic with satisfactory vascular status Diabetic peripheral neuropathy Hyperpronation resulting in painful left foot The additional padding attached to the insole has significantly reduce the pain in the base of first metatarsocuneiform area on the left foot Significant degenerative changes left foot Gait disturbance  Plan: At this time I reviewed the results of the exam and x-ray with patient today in detail. I made him aware that he has significant flatfoot deformity causing hyperpronation, ingrowing and pressure on the base of first metatarsocuneiform area. Because he has an unstable gait I am deferring on cast immobilization as a concern that this may possibly create a further unstable gait resulting in a fall. I attached an additional felt pad to further offload the base of the first metatarsocuneiform area on the custom shoe insole. Also on the visit of 05/02/2016 further padding was attached to the custom insole on the left foot to offload the base of first metatarsal cuneiform area. He will continue wearing his diabetic shoes with a modified custom insole on the left foot.  I advised patient to wear the custom insoles has diabetic shoes at all times when he is weightbearing.  Reappoint at patient's request for follow-up and at three-month intervals for nail debridement

## 2016-05-09 NOTE — Patient Instructions (Signed)
Where your diabetic shoes with custom insoles with the additional felt padding on ongoing continuous basis when standing and walking  Diabetes and Foot Care Diabetes may cause you to have problems because of poor blood supply (circulation) to your feet and legs. This may cause the skin on your feet to become thinner, break easier, and heal more slowly. Your skin may become dry, and the skin may peel and crack. You may also have nerve damage in your legs and feet causing decreased feeling in them. You may not notice minor injuries to your feet that could lead to infections or more serious problems. Taking care of your feet is one of the most important things you can do for yourself.  HOME CARE INSTRUCTIONS  Wear shoes at all times, even in the house. Do not go barefoot. Bare feet are easily injured.  Check your feet daily for blisters, cuts, and redness. If you cannot see the bottom of your feet, use a mirror or ask someone for help.  Wash your feet with warm water (do not use hot water) and mild soap. Then pat your feet and the areas between your toes until they are completely dry. Do not soak your feet as this can dry your skin.  Apply a moisturizing lotion or petroleum jelly (that does not contain alcohol and is unscented) to the skin on your feet and to dry, brittle toenails. Do not apply lotion between your toes.  Trim your toenails straight across. Do not dig under them or around the cuticle. File the edges of your nails with an emery board or nail file.  Do not cut corns or calluses or try to remove them with medicine.  Wear clean socks or stockings every day. Make sure they are not too tight. Do not wear knee-high stockings since they may decrease blood flow to your legs.  Wear shoes that fit properly and have enough cushioning. To break in new shoes, wear them for just a few hours a day. This prevents you from injuring your feet. Always look in your shoes before you put them on to be sure  there are no objects inside.  Do not cross your legs. This may decrease the blood flow to your feet.  If you find a minor scrape, cut, or break in the skin on your feet, keep it and the skin around it clean and dry. These areas may be cleansed with mild soap and water. Do not cleanse the area with peroxide, alcohol, or iodine.  When you remove an adhesive bandage, be sure not to damage the skin around it.  If you have a wound, look at it several times a day to make sure it is healing.  Do not use heating pads or hot water bottles. They may burn your skin. If you have lost feeling in your feet or legs, you may not know it is happening until it is too late.  Make sure your health care provider performs a complete foot exam at least annually or more often if you have foot problems. Report any cuts, sores, or bruises to your health care provider immediately. SEEK MEDICAL CARE IF:   You have an injury that is not healing.  You have cuts or breaks in the skin.  You have an ingrown nail.  You notice redness on your legs or feet.  You feel burning or tingling in your legs or feet.  You have pain or cramps in your legs and feet.  Your legs  or feet are numb.  Your feet always feel cold. SEEK IMMEDIATE MEDICAL CARE IF:   There is increasing redness, swelling, or pain in or around a wound.  There is a red line that goes up your leg.  Pus is coming from a wound.  You develop a fever or as directed by your health care provider.  You notice a bad smell coming from an ulcer or wound.   This information is not intended to replace advice given to you by your health care provider. Make sure you discuss any questions you have with your health care provider.   Document Released: 10/26/2000 Document Revised: 07/01/2013 Document Reviewed: 04/07/2013 Elsevier Interactive Patient Education [employer].

## 2016-05-16 ENCOUNTER — Ambulatory Visit: Payer: Medicare Other | Admitting: Audiology

## 2016-06-06 ENCOUNTER — Ambulatory Visit (INDEPENDENT_AMBULATORY_CARE_PROVIDER_SITE_OTHER): Payer: Medicare Other | Admitting: Podiatry

## 2016-06-06 ENCOUNTER — Encounter: Payer: Self-pay | Admitting: Podiatry

## 2016-06-06 DIAGNOSIS — L84 Corns and callosities: Secondary | ICD-10-CM

## 2016-06-06 DIAGNOSIS — M216X2 Other acquired deformities of left foot: Secondary | ICD-10-CM

## 2016-06-06 DIAGNOSIS — E1140 Type 2 diabetes mellitus with diabetic neuropathy, unspecified: Secondary | ICD-10-CM

## 2016-06-06 NOTE — Patient Instructions (Addendum)
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 This may cause the skin on your feet to become thinner, break easier, and heal more slowly. Your skin may become dry, and the skin may peel and crack. You may also have nerve damage in your legs and feet causing decreased feeling in them. You may not notice minor injuries to your feet that could lead to infections or more serious problems. Taking care of your feet is one of the most important things you can do for yourself.  HOME CARE INSTRUCTIONS  Wear shoes at all times, even in the house. Do not go barefoot. Bare feet are easily injured.  Check your feet daily for blisters, cuts, and redness. If you cannot see the bottom of your feet, use a mirror or ask someone for help.  Wash your feet with warm water (do not use hot water) and mild soap. Then pat your feet and the areas between your toes until they are completely dry. Do not soak your feet as this can dry your skin.  Apply a moisturizing lotion or petroleum jelly (that does not contain alcohol and is unscented) to the skin on your feet and to dry, brittle toenails. Do not apply lotion between your toes.  Trim your toenails straight across. Do not dig under them or around the cuticle. File the edges of your nails with an emery board or nail file.  Do not cut corns or calluses or try to remove them with medicine.  Wear clean socks or stockings every day. Make sure they are not too tight. Do not wear knee-high stockings since they may decrease blood flow to your legs.  Wear shoes that fit properly and have enough cushioning. To break in new shoes, wear them for just a few hours a day. This prevents you from injuring your feet.  Always look in your shoes before you put them on to be sure there are no objects inside.  Do not cross your legs. This may decrease the blood flow to your feet.  If you find a minor scrape, cut, or break in the skin on your feet, keep it and the skin around it clean and dry. These areas may be cleansed with mild soap and water. Do not cleanse the area with peroxide, alcohol, or iodine.  When you remove an adhesive bandage, be sure not to damage the skin around it.  If you have a wound, look at it several times a day to make sure it is healing.  Do not use heating pads or hot water bottles. They may burn your skin. If you have lost feeling in your feet or legs, you may not know it is happening until it is too late.  Make sure your health care provider performs a complete foot exam at least annually or more often if you have foot problems. Report any cuts, sores, or bruises to your health care provider immediately. SEEK MEDICAL CARE IF:   You have an injury that is not healing.  You have cuts or breaks in the skin.  You have an ingrown nail.  You notice redness on your legs or feet.  You feel burning or tingling in your legs or feet.  You have pain or cramps in your legs and feet.  Your legs or feet are numb.  Your feet always feel cold. SEEK IMMEDIATE MEDICAL CARE IF:   There is increasing redness, swelling, or pain in or around a wound.  There is a red line that goes up your leg.  Pus is coming from a wound.  You develop a fever or as directed by your health care provider.  You notice a bad smell coming from an ulcer or wound.   This information is not intended to replace advice given to you by your health care provider. Make sure you discuss any questions you have with your health care provider.   Document Released: 10/26/2000 Document Revised: 07/01/2013 Document Reviewed: 04/07/2013 Elsevier Interactive Patient Education 2016 [employer]  

## 2016-06-07 NOTE — Progress Notes (Signed)
Patient ID: Paul [name], male   DOB: 1940-12-31, 75 y.o.   MRN: [id number]  Subjective: This patient presents today for follow-up visit for a painful plantar skin lesion on the left foot associated with hyperpronation and peripheral neuropathy. On the initial visit an additional felt pad was attached to the diabetic insole to further offload the [place] base of the first metatarsal cuneiform area on the left foot. Patient states that when he  He wears the diabetic shoes with the additional felt padding attached to the insole the discomfort has improved. He admits that at times he walks around the house without the shoe insole or a protective shoe which exacerbates the pain this area.he says that he has has discomfort in the plantar callus the base first metatarsal cuneiform area when walking  Orientated 3  Vascular: No peripheral edema bilaterally No calf pain or calf edema bilaterally DP pulses 2/4 bilaterally PT pulses 2/4 bilaterally Capillary reflex immediate bilaterally  Neurological: Sensation to 10 g monofilament wire intact 1/5 right 2/5 left Vibratory sensation nonreactive bilaterally Ankle reflex equal and reactive bilaterally  Dermatological: No open skin lesions bilaterally Plantar callus base first metatarsocuneiform without any bleeding within callus  there is no erythema or warmth in this callused area  Musculoskeletal: Upon weight-bearing patient has extreme pes planus with the forefoot significantly abducted on the rear foot bilaterally Patient has slow unstable gait with maximum hyperpronation throughout the gait cycle There is palpable tenderness in or around the base of first metatarsal cuneiform which seems to duplicate patient's area of discomfort Patient has diabetic shoes with custom insoles that contour well and in a good state of repair   X-ray examination weightbearing left foot dated 05/02/2016  Intact bony structure without fracture and/or  dislocation Decreased bone density in all views Significant degenerative changes mid foot and Lisfranc articulations Forefoot abducted on the rear foot Plantarflexed talus Pes planus Third left metatarsal demonstrates previous fracture with healing  Radiographic impression an x-ray dated 05/02/2016 No acute bony abnormality Decreased bone density Midfoot degenerative changes Sagittal and transverse plane flatfoot deformity Healed third metatarsal fracture       Assessment: Diabetic with satisfactory vascular status Diabetic peripheral neuropathy Hyperpronation resulting in painful left foot The additional padding attached to the insole has significantly reduce the pre-ulcerative callus on the base of first metatarsocuneiform area on the left foot Significant degenerative changes left foot Gait disturbance  Plan: The plantar pre-ulcerative callus on the left foot was debrided. Discussed follow-up care including continuing monitoring patient at approximately month intervals while he is wearing his diabetic shoes with additional padding attached to the left insole to offload the plantar base the first metatarsocuneiform. Also in our discussion we talked about possible custom molded shoes with deep pocket accommodations or brace within the shoes. At this time because the plantar callus was not showing any obvious breakdown will continue to have patient return at monthly intervals and monitor the area.  Reappoint 4 weeks or sooner if patient has concern

## 2016-07-04 ENCOUNTER — Ambulatory Visit (INDEPENDENT_AMBULATORY_CARE_PROVIDER_SITE_OTHER): Payer: Medicare Other | Admitting: Podiatry

## 2016-07-04 DIAGNOSIS — L84 Corns and callosities: Secondary | ICD-10-CM | POA: Diagnosis not present

## 2016-07-04 DIAGNOSIS — E1140 Type 2 diabetes mellitus with diabetic neuropathy, unspecified: Secondary | ICD-10-CM

## 2016-07-04 DIAGNOSIS — M216X2 Other acquired deformities of left foot: Secondary | ICD-10-CM

## 2016-07-04 NOTE — Patient Instructions (Signed)
Diabetes and Foot Care Diabetes may cause you to have problems because of poor blood supply (circulation) to your feet and legs. This may cause the skin on your feet to become thinner, break easier, and heal more slowly. Your skin may become dry, and the skin may peel and crack. You may also have nerve damage in your legs and feet causing decreased feeling in them. You may not notice minor injuries to your feet that could lead to infections or more serious problems. Taking care of your feet is one of the most important things you can do for yourself.  HOME CARE INSTRUCTIONS  Wear shoes at all times, even in the house. Do not go barefoot. Bare feet are easily injured.  Check your feet daily for blisters, cuts, and redness. If you cannot see the bottom of your feet, use a mirror or ask someone for help.  Wash your feet with warm water (do not use hot water) and mild soap. Then pat your feet and the areas between your toes until they are completely dry. Do not soak your feet as this can dry your skin.  Apply a moisturizing lotion or petroleum jelly (that does not contain alcohol and is unscented) to the skin on your feet and to dry, brittle toenails. Do not apply lotion between your toes.  Trim your toenails straight across. Do not dig under them or around the cuticle. File the edges of your nails with an emery board or nail file.  Do not cut corns or calluses or try to remove them with medicine.  Wear clean socks or stockings every day. Make sure they are not too tight. Do not wear knee-high stockings since they may decrease blood flow to your legs.  Wear shoes that fit properly and have enough cushioning. To break in new shoes, wear them for just a few hours a day. This prevents you from injuring your feet. Always look in your shoes before you put them on to be sure there are no objects inside.  Do not cross your legs. This may decrease the blood flow to your feet.  If you find a minor scrape,  cut, or break in the skin on your feet, keep it and the skin around it clean and dry. These areas may be cleansed with mild soap and water. Do not cleanse the area with peroxide, alcohol, or iodine.  When you remove an adhesive bandage, be sure not to damage the skin around it.  If you have a wound, look at it several times a day to make sure it is healing.  Do not use heating pads or hot water bottles. They may burn your skin. If you have lost feeling in your feet or legs, you may not know it is happening until it is too late.  Make sure your health care provider performs a complete foot exam at least annually or more often if you have foot problems. Report any cuts, sores, or bruises to your health care provider immediately. SEEK MEDICAL CARE IF:   You have an injury that is not healing.  You have cuts or breaks in the skin.  You have an ingrown nail.  You notice redness on your legs or feet.  You feel burning or tingling in your legs or feet.  You have pain or cramps in your legs and feet.  Your legs or feet are numb.  Your feet always feel cold. SEEK IMMEDIATE MEDICAL CARE IF:   There is increasing redness,   swelling, or pain in or around a wound.  There is a red line that goes up your leg.  Pus is coming from a wound.  You develop a fever or as directed by your health care provider.  You notice a bad smell coming from an ulcer or wound.   This information is not intended to replace advice given to you by your health care provider. Make sure you discuss any questions you have with your health care provider.   Document Released: 10/26/2000 Document Revised: 07/01/2013 Document Reviewed: 04/07/2013 Elsevier Interactive Patient Education 2016 [employer]vier Inc.  

## 2016-07-05 DIAGNOSIS — H26493 Other secondary cataract, bilateral: Secondary | ICD-10-CM | POA: Diagnosis not present

## 2016-07-05 DIAGNOSIS — H52223 Regular astigmatism, bilateral: Secondary | ICD-10-CM | POA: Diagnosis not present

## 2016-07-05 DIAGNOSIS — H5203 Hypermetropia, bilateral: Secondary | ICD-10-CM | POA: Diagnosis not present

## 2016-07-05 DIAGNOSIS — E119 Type 2 diabetes mellitus without complications: Secondary | ICD-10-CM | POA: Diagnosis not present

## 2016-07-05 DIAGNOSIS — H524 Presbyopia: Secondary | ICD-10-CM | POA: Diagnosis not present

## 2016-07-05 DIAGNOSIS — Z961 Presence of intraocular lens: Secondary | ICD-10-CM | POA: Diagnosis not present

## 2016-07-05 NOTE — Progress Notes (Signed)
Patient ID: Paul [name], male   DOB: [birth year], 75 y.o.   MRN: [id number]   Subjective: This patient presents today for follow-up visit for a painful plantar skin lesion on the left foot associated with hyperpronation and peripheral neuropathy. On the initial visit an additional felt pad was attached to the diabetic insole to further offload the [place] base of the first metatarsal cuneiform area on the left foot. Patient states that when he He wears the diabetic shoes with the additional felt padding attached to the insole the discomfort has improved. He admits that at times he walks around the house without the shoe insole or a protective shoe which exacerbates the pain this area.he says that he has has discomfort in the plantar callus the base first metatarsal cuneiform area when walking  Today this patient states that the additional shoe padding attaches custom insoles are providing adequate relief in the pre-ulcerative callus in the left foot. He is requesting debridement of this lesion as well as attaching additional felt to 2 additional custom insoles to offload the plantar base of the first metatarsal cuneiform area  Orientated 3  Vascular: No peripheral edema bilaterally No calf pain or calf edema bilaterally DP pulses 2/4 bilaterally PT pulses 2/4 bilaterally Capillary reflex immediate bilaterally  Neurological: Sensation to 10 g monofilament wire intact 1/5 right 2/5 left Vibratory sensation nonreactive bilaterally Ankle reflex equal and reactive bilaterally  Dermatological: No open skin lesions bilaterally Plantar callus base first metatarsocuneiform without any bleeding within callus there is no erythema or warmth in this callused area The toenails are discolored, brittle, deformed and adequately treatment at this time   Musculoskeletal: Upon weight-bearing patient has extreme pes planus with the forefoot significantly abducted on the rear foot  bilaterally Patient has slow unstable gait with maximum hyperpronation throughout the gait cycle There is palpable tenderness in or around the base of first metatarsal cuneiform which seems to duplicate patient's area of discomfort Patient has diabetic shoes with custom insoles that contour well and in a good state of repair   X-ray examination weightbearing left foot dated 05/02/2016  Intact bony structure without fracture and/or dislocation Decreased bone density in all views Significant degenerative changes mid foot and Lisfranc articulations Forefoot abducted on the rear foot Plantarflexed talus Pes planus Third left metatarsal demonstrates previous fracture with healing  Radiographic impression an x-ray dated 05/02/2016 No acute bony abnormality Decreased bone density Midfoot degenerative changes Sagittal and transverse plane flatfoot deformity Healed third metatarsal fracture       Assessment: Diabetic with satisfactory vascular status Diabetic peripheral neuropathy Hyperpronation resulting in painful left foot The additional padding attached to the insole has significantly reduce the pre-ulcerative callus on the base of first metatarsocuneiform area on the left foot Significant degenerative changes left foot Gait disturbance  Plan: The plantar pre-ulcerative callus on the left foot was debrided. Discussed follow-up care including continuing monitoring . he is wearing his diabetic shoes with additional padding attached to the left insole to offload the plantar base the first metatarsocuneiform.  Attach additional felt pads to the plantar aspect of two  custom insoles  Reappoint 2 months for nail debridement or sooner if patient has a concern

## 2016-07-13 DIAGNOSIS — S82209A Unspecified fracture of shaft of unspecified tibia, initial encounter for closed fracture: Secondary | ICD-10-CM

## 2016-07-13 HISTORY — DX: Unspecified fracture of shaft of unspecified tibia, initial encounter for closed fracture: S82.209A

## 2016-07-13 HISTORY — PX: OTHER SURGICAL HISTORY: SHX169

## 2016-07-31 DIAGNOSIS — Z79899 Other long term (current) drug therapy: Secondary | ICD-10-CM | POA: Diagnosis not present

## 2016-07-31 DIAGNOSIS — I10 Essential (primary) hypertension: Secondary | ICD-10-CM | POA: Diagnosis not present

## 2016-07-31 DIAGNOSIS — E1140 Type 2 diabetes mellitus with diabetic neuropathy, unspecified: Secondary | ICD-10-CM | POA: Diagnosis not present

## 2016-07-31 DIAGNOSIS — F17210 Nicotine dependence, cigarettes, uncomplicated: Secondary | ICD-10-CM | POA: Diagnosis not present

## 2016-07-31 DIAGNOSIS — Z888 Allergy status to other drugs, medicaments and biological substances status: Secondary | ICD-10-CM | POA: Diagnosis not present

## 2016-07-31 DIAGNOSIS — Z7982 Long term (current) use of aspirin: Secondary | ICD-10-CM | POA: Diagnosis not present

## 2016-07-31 DIAGNOSIS — Z7984 Long term (current) use of oral hypoglycemic drugs: Secondary | ICD-10-CM | POA: Diagnosis not present

## 2016-07-31 DIAGNOSIS — D492 Neoplasm of unspecified behavior of bone, soft tissue, and skin: Secondary | ICD-10-CM | POA: Diagnosis not present

## 2016-07-31 DIAGNOSIS — Z885 Allergy status to narcotic agent status: Secondary | ICD-10-CM | POA: Diagnosis not present

## 2016-07-31 DIAGNOSIS — E7800 Pure hypercholesterolemia, unspecified: Secondary | ICD-10-CM | POA: Diagnosis not present

## 2016-08-01 ENCOUNTER — Ambulatory Visit (INDEPENDENT_AMBULATORY_CARE_PROVIDER_SITE_OTHER): Payer: Medicare Other | Admitting: Internal Medicine

## 2016-08-01 ENCOUNTER — Encounter: Payer: Self-pay | Admitting: Internal Medicine

## 2016-08-01 ENCOUNTER — Encounter (INDEPENDENT_AMBULATORY_CARE_PROVIDER_SITE_OTHER): Payer: Medicare Other | Admitting: Internal Medicine

## 2016-08-01 VITALS — BP 136/68 | HR 87 | Ht 67.0 in | Wt 248.0 lb

## 2016-08-01 DIAGNOSIS — J449 Chronic obstructive pulmonary disease, unspecified: Secondary | ICD-10-CM

## 2016-08-01 DIAGNOSIS — F17210 Nicotine dependence, cigarettes, uncomplicated: Secondary | ICD-10-CM | POA: Diagnosis not present

## 2016-08-01 DIAGNOSIS — J9611 Chronic respiratory failure with hypoxia: Secondary | ICD-10-CM | POA: Diagnosis not present

## 2016-08-01 DIAGNOSIS — I2510 Atherosclerotic heart disease of native coronary artery without angina pectoris: Secondary | ICD-10-CM

## 2016-08-01 LAB — PULMONARY FUNCTION TEST
DL/VA % pred: 42 %
DL/VA: 1.87 ml/min/mmHg/L
DLCO cor % pred: 39 %
DLCO cor: 11.05 ml/min/mmHg
DLCO unc % pred: 37 %
DLCO unc: 10.59 ml/min/mmHg
FEF 25-75 Post: 0.98 L/sec
FEF 25-75 Pre: 0.76 L/sec
FEF2575-%Change-Post: 28 %
FEF2575-%Pred-Post: 52 %
FEF2575-%Pred-Pre: 40 %
FEV1-%Change-Post: 8 %
FEV1-%Pred-Post: 76 %
FEV1-%Pred-Pre: 70 %
FEV1-Post: 2.03 L
FEV1-Pre: 1.87 L
FEV1FVC-%Change-Post: 4 %
FEV1FVC-%Pred-Pre: 75 %
FEV6-%Change-Post: 4 %
FEV6-%Pred-Post: 100 %
FEV6-%Pred-Pre: 96 %
FEV6-Post: 3.43 L
FEV6-Pre: 3.3 L
FEV6FVC-%Change-Post: 0 %
FEV6FVC-%Pred-Post: 103 %
FEV6FVC-%Pred-Pre: 102 %
FVC-%Change-Post: 4 %
FVC-%Pred-Post: 97 %
FVC-%Pred-Pre: 93 %
FVC-Post: 3.58 L
FVC-Pre: 3.44 L
Post FEV1/FVC ratio: 57 %
Post FEV6/FVC ratio: 97 %
Pre FEV1/FVC ratio: 54 %
Pre FEV6/FVC Ratio: 96 %
RV % pred: 91 %
RV: 2.2 L
TLC % pred: 92 %
TLC: 5.96 L

## 2016-08-01 NOTE — Patient Instructions (Addendum)
Plan A = Automatic = symbicort 160 Take 2 puffs first thing in am and then another 2 puffs about 12 hours later.     Plan B = Backup Only use your albuterol (proair) as a rescue medication to be used if you can't catch your breath by resting or doing a relaxed purse lip breathing pattern.  - The less you use it, the better it will work when you need it. - Ok to use the inhaler up to 2 puffs  every 4 hours if you must but call for appointment if use goes up over your usual need - Don't leave home without it !!  (think of it like the spare tire for your car)     Please schedule a follow up visit in 6 months but call sooner if needed

## 2016-08-01 NOTE — Assessment & Plan Note (Signed)
>  3 min Discussed the risks and costs (both direct and indirect)  of smoking relative to the benefits of quitting but patient unwilling to commit at this point to a specific quit date.   Warned re use of cigs and 02   Offered to help with quitting by use of chantix or referral to our [employer] when the patient is ready.

## 2016-08-01 NOTE — Progress Notes (Signed)
Subjective:    Patient ID: Paul [name], male    DOB: 01/02/1941  MRN: [id number]    Brief patient profile:  74yowm smoker with dx of copd with worse symptoms since 2013 referred to pulmonary clinic 12/24/2012 by Dr Darcus Austin for COPD evaluation with GOLD II criteria May 2014    History of Present Illness  12/24/2012 1st pulmonary eval  On advair/ spiriva longterm with freq use of proventil prior to dx  PE presenting as pleurtic L CP May 2013 with "moderate clot burden" to  [facility] assoc with PAF and started on Xarelto and baseline doe x climbing steps but could walk flat all day long before and after PE but then indolent onset doe progressively worse x 9 months assoc with cough congestion > green mucus better p on levaquin x one week but convinced his coughing and breathing problems are all due to xarelto because of what he read on the package. rec Only use your albuterol (proaire) as a rescue medication (plan B)  to be used if you can't catch your breath   The key is to stop smoking completely before smoking completely stops you     02/05/2013 f/u ov/Paul [name] cc need less albuterol maint on advair and spiriva with worse cough and sob x mailbox and back and collapse in chair.   Mucus turned green again x 2 weeks prior to OV   rec Stop advair and lisinopril Start symbicort 160 Take 2 puffs first thing in am and then another 2 puffs about 12 hours later.  Only use your albuterol(proaire)  as a rescue medication to be used if you can't catch your breath by resting or doing a relaxed purse lip breathing pattern. The less you use it, the better it will work when you need it.  Prednisone 10 mg take  4 each am x 2 days,   2 each am x 2 days,  1 each am x2days and stop  Levaquin 750 x 5 days For cough mucinex dm best as needed  03/26/2013 f/u ov/Paul [name] re copd Chief Complaint  Patient presents with  . Follow-up    Breathing is unchanged since the last visit. Ran out of symbicort so started back on  advair about 2 wks ago. He could not tell difference between the 2 meds, but spouse noted he did not cough as much on symbicort.  mucus is more day than night, usually not green rec Ok to stop spiriva Work on Engineer, technical sales technique:      06/21/2014 f/u ov/Paul [name] re: GOLD II COPD / still smoking, concerned re side effects of meds  Chief Complaint  Patient presents with  . Follow-up    Pt states had six minute walk test which was abnormal. He states that he was referred here to discuss this per Dr Darcus Austin. Pt was last seen here in May 2014 and states that his breathing is unchanged since then.     lots of phlegm and congestion each am and occ wakes at 3 am but mostly daytime. 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 Follow-up    Pt states that his cough and SOB are unchanged since the last visit. No new co's today.    no proair  Day of ov/ pacing himself and no longer doing steps so less sob/ less saba need - rattling cough esp in am, thick, slt green, not on max dose mucinex but seems to helps whereas even levaquin did not >> no changes , declined O2 .   03/07/2015 Follow up COPD /still smoking  Patient returns for a six-month follow-up. He says overall he has been doing okay He is currently a pulmonary rehabilitation and feels that it is really helping him. His O2 saturations do drop with exercise and he uses oxygen at pulmonary rehabilitation [facility] , he wears O2 with exercise and does much better.  O2 sat at rest are 95% walking. Today in office with drop with O2 saturation 88% on room air. Patient  denies any chest pain, palpitations, orthopnea, PND, or increased leg swelling. He does request that his oxygen orders be sent to [facility]  and is looking at the  portable concentrator unit-Innogen.  He continues to smoke. We discussed cessation. He says he has no interest in quitting smoking. We discussed the dangers of oxygen and smoking. Patient was noted to have oxygen desaturations. Last visit and declined oxygen but now says that he is rated begin oxygen. rec Continue on current regimen  Work on not smoking  Begin Oxygen 2l/m with activity  Order sent to [name] for evaluation of portable concentrator >  Inogen 2lpm pulse and 3lpm pulse when walk       09/05/2015  f/u ov/Paul [name] re: COPD II/ still smoking/ on advair 250 bid and rare saba at baseline  Chief Complaint  Patient presents with  . Follow-up    Pt c/o increased SOB and cough for the past wk. His cough is prod with green sputum and "tastes like clorox bleach".  baseline doe = MMRC2 = can't walk a nl pace on a flat grade s sob, worse with onset of cough and increase need for saba but still no more than twice daily rec The key is to stop smoking completely before smoking completely stops you!  Augmentin 875 mg take one pill twice daily  X 10 days - take at breakfast and supper with large glass of water.  It would help reduce the usual side effects (diarrhea and yeast infections) if you ate cultured yogurt at lunch.  Prednisone 10 mg take  4 each am x 2 days,   2 each am x 2 days,  1 each am x 2 days and stop    01/26/2016  f/u ov/Paul [name] re: GOLD II/ still smoking 02 3lpm on POC/ 2lpm on conc  Chief Complaint  Patient presents with  . Follow-up    Pt c/o cough with green sputum. He states that his breathing is progressively worse since last visit. He states that he can not go from the bedroom to the bathroom without having to use albuterol- using 6 x daily on average. Sats on RA at rest today 83%---increased to 93% on 3lpm pulsed  o2.   L cp under scapula up to hours then tylenol helps / not pleuritic x months  Mucus always discolored esp in am's then somewhat less so the rest of the day / no change with abx  rec Plan A = Automatic = try off advair and start symbicort 160 2bid  Work on  inhaler technique:  relax and gently blow all the way out then take a nice smooth deep breath back in, triggering the inhaler at same time you start breathing in.  Hold for up to 5 seconds if you can. Blow out thru nose. Rinse and gargle with water when done Plan B = Backup Only use your albuterol as a rescue medication  The key is to stop smoking completely before smoking completely stops you!  Goal of 02 is to keep saturations above 90% at all times     04/30/2016  f/u ov/Paul [name] re: GOLD 2 still smoking/ symbicort  Chief Complaint  Patient presents with  . Follow-up    Breathing is unchanged. No new co's. He is using albuterol inhaler 2 x daily on average.   wearing 3lpm with walking and nothing at rest with ok sats and 2lpm at hs s noct saba  MMRC3 = can't walk 100 yards even at a slow pace at a flat grade s stopping due to sob  Even on 3lpm  rec No change in medications or 02 recs The key is to stop smoking completely before smoking completely stops you - it's the most important aspect of your care     08/01/2016  f/u ov/Paul [name] re: GOLD 2 still smoking/ symb 160 just one bid  Chief Complaint  Patient presents with  . Follow-up    66morov. PFT results. pt states breathing is baseline, pt c/o sob w/exertion, prod cough w/green mucus & wheezing at times.  using sev saba's daily but not noct  Not using 02 as rec  Not using sym as rec  MMRC3 no change     No obvious day to day or daytime variability or assoc excess/ purulent sputum or mucus plugs   or chest tightness, subjective wheeze or overt sinus or hb symptoms. No unusual exp hx or h/o childhood pna/ asthma or knowledge of premature birth.  Sleeping ok without nocturnal  or  early am exacerbation  of respiratory  c/o's or need for noct saba. Also denies any obvious fluctuation of symptoms with weather or environmental changes or other aggravating or alleviating factors except as outlined above   Current Medications, Allergies, Complete Past Medical History, Past Surgical History, Family History, and Social History were reviewed in [employer].  ROS  The following are not active complaints unless bolded sore throat, dysphagia, dental problems, itching, sneezing,  nasal congestion or excess/ purulent secretions, ear ache,   fever, chills, sweats, unintended wt loss, classically pleuritic or exertional cp, hemoptysis,  orthopnea pnd or leg swelling, presyncope, palpitations, abdominal pain, anorexia, nausea, vomiting, diarrhea  or change in bowel or bladder habits, change in stools or urine, dysuria,hematuria,  rash, arthralgias, visual complaints, headache, numbnesweakness or ataxia or problems with walking or coordination,  change in mood/affect or memory.                    Objective:   Physical Exam  02/05/2013  Wt 266 > 03/26/2013  269 > 06/21/2014 261 > 08/04/2014  256 >257 03/07/2015 > 09/05/2015  254 > 01/26/2016  250 > 08/01/2016  248    amb obese wm / vital signs reviewed  - sats 94% on on 3lpm Pulsed POC    HEENT mild turbinate edema.  Oropharynx no thrush or excess pnd or cobblestoning.  No JVD or cervical adenopathy. Mild accessory muscle hypertrophy. Trachea midline, nl thryroid. Chest was hyperinflated by percussion with diminished breath sounds  and moderate increased exp time with distant bs  - no wheeze. Hoover sign positive at mid inspiration. Kyphosis  Regular rate and rhythm without murmur gallop or rub or increase P2 or edema.  Abd: no hsm, nl excursion. Ext warm without cyanosis or clubbing.              Assessment & Plan:

## 2016-08-01 NOTE — Assessment & Plan Note (Addendum)
-  PFT's 03/12/13 FEV1  2.27 (79%) ratio 52 and no better p B2 DLCO 45 and 60% p correction - Ex desats documented 06/21/14 > refused 02 > agreed to accept 03/26/15 see chronic resp failure  - 01/26/2016  ry symbicort instead of advair - 04/30/2016  After extensive coaching HFA effectiveness =    90%  - PFT's  08/01/2016  FEV1 2.03  (76 % ) ratio 57  p 8 % improvement from saba p symbicort 160 x one puff  prior to study with DLCO  37/39 % corrects to 42  % for alv volume  > increase symb 160 2bid   Despite confusion with 02 and symb and still smoking he's actually doing well and no change rx needed at this point.   I had an extended discussion with the patient reviewing all relevant studies completed to date and  lasting 15 to 20 minutes of a 25 minute visit    Each maintenance medication was reviewed in detail including most importantly the difference between maintenance and prns and under what circumstances the prns are to be triggered using an action plan format that is not reflected in the computer generated alphabetically organized AVS.    Please see instructions for details which were reviewed in writing and the patient given a copy highlighting the part that I personally wrote and discussed at today's ov.

## 2016-08-01 NOTE — Assessment & Plan Note (Signed)
04/05/15 SATURATION QUALIFICATIONS: (This note is used to comply with regulatory documentation for home oxygen) Patient Saturations on Room Air at Rest = 95% Patient Saturations on Room Air while Ambulating = 88% Patient Saturations on 2 Liters of oxygen while Ambulating = 93% Please briefly explain why patient needs home oxygen: desats with ambulation - 01/26/2016  Patient Saturations on Room Air at Rest =83%--increased to 93% 3lpm pulsed o2  As of 08/01/2016 rec  3lpm at rest/ sleep/ titrate to > 90% walking

## 2016-08-01 NOTE — Assessment & Plan Note (Signed)
Complicated by dm, hbp/ hyperlipidemia   Body mass index is 38.84 - no real change  Lab Results  Component Value Date   TSH 1.91 05/26/2012     Contributing to gerd tendency/ doe/reviewed the need and the process to achieve and maintain neg calorie balance > defer f/u primary care including intermittently monitoring thyroid status

## 2016-08-08 ENCOUNTER — Emergency Department (HOSPITAL_COMMUNITY): Payer: Medicare Other

## 2016-08-08 ENCOUNTER — Encounter (HOSPITAL_COMMUNITY): Payer: Self-pay

## 2016-08-08 ENCOUNTER — Emergency Department (HOSPITAL_COMMUNITY)
Admission: EM | Admit: 2016-08-08 | Discharge: 2016-08-08 | Disposition: A | Discharge status: Home / Self Care | Payer: Medicare Other | Attending: Emergency Medicine | Admitting: Emergency Medicine

## 2016-08-08 DIAGNOSIS — S82402A Unspecified fracture of shaft of left fibula, initial encounter for closed fracture: Secondary | ICD-10-CM | POA: Diagnosis not present

## 2016-08-08 DIAGNOSIS — T148 Other injury of unspecified body region: Secondary | ICD-10-CM | POA: Diagnosis not present

## 2016-08-08 DIAGNOSIS — Z7984 Long term (current) use of oral hypoglycemic drugs: Secondary | ICD-10-CM | POA: Insufficient documentation

## 2016-08-08 DIAGNOSIS — I2510 Atherosclerotic heart disease of native coronary artery without angina pectoris: Secondary | ICD-10-CM | POA: Insufficient documentation

## 2016-08-08 DIAGNOSIS — M25562 Pain in left knee: Secondary | ICD-10-CM | POA: Diagnosis not present

## 2016-08-08 DIAGNOSIS — S82192A Other fracture of upper end of left tibia, initial encounter for closed fracture: Secondary | ICD-10-CM | POA: Insufficient documentation

## 2016-08-08 DIAGNOSIS — F17210 Nicotine dependence, cigarettes, uncomplicated: Secondary | ICD-10-CM | POA: Insufficient documentation

## 2016-08-08 DIAGNOSIS — Z85118 Personal history of other malignant neoplasm of bronchus and lung: Secondary | ICD-10-CM | POA: Insufficient documentation

## 2016-08-08 DIAGNOSIS — E119 Type 2 diabetes mellitus without complications: Secondary | ICD-10-CM | POA: Insufficient documentation

## 2016-08-08 DIAGNOSIS — S8252XA Displaced fracture of medial malleolus of left tibia, initial encounter for closed fracture: Secondary | ICD-10-CM | POA: Diagnosis not present

## 2016-08-08 DIAGNOSIS — J449 Chronic obstructive pulmonary disease, unspecified: Secondary | ICD-10-CM | POA: Diagnosis not present

## 2016-08-08 DIAGNOSIS — I481 Persistent atrial fibrillation: Secondary | ICD-10-CM | POA: Diagnosis not present

## 2016-08-08 DIAGNOSIS — S82202A Unspecified fracture of shaft of left tibia, initial encounter for closed fracture: Secondary | ICD-10-CM

## 2016-08-08 DIAGNOSIS — S82302A Unspecified fracture of lower end of left tibia, initial encounter for closed fracture: Secondary | ICD-10-CM | POA: Diagnosis not present

## 2016-08-08 DIAGNOSIS — S50811A Abrasion of right forearm, initial encounter: Secondary | ICD-10-CM | POA: Insufficient documentation

## 2016-08-08 DIAGNOSIS — Y999 Unspecified external cause status: Secondary | ICD-10-CM | POA: Insufficient documentation

## 2016-08-08 DIAGNOSIS — M21862 Other specified acquired deformities of left lower leg: Secondary | ICD-10-CM | POA: Diagnosis not present

## 2016-08-08 DIAGNOSIS — Y939 Activity, unspecified: Secondary | ICD-10-CM | POA: Insufficient documentation

## 2016-08-08 DIAGNOSIS — M21962 Unspecified acquired deformity of left lower leg: Secondary | ICD-10-CM | POA: Insufficient documentation

## 2016-08-08 DIAGNOSIS — W010XXA Fall on same level from slipping, tripping and stumbling without subsequent striking against object, initial encounter: Secondary | ICD-10-CM | POA: Insufficient documentation

## 2016-08-08 DIAGNOSIS — Z79899 Other long term (current) drug therapy: Secondary | ICD-10-CM | POA: Insufficient documentation

## 2016-08-08 DIAGNOSIS — I10 Essential (primary) hypertension: Secondary | ICD-10-CM | POA: Insufficient documentation

## 2016-08-08 DIAGNOSIS — Y9259 Other trade areas as the place of occurrence of the external cause: Secondary | ICD-10-CM | POA: Insufficient documentation

## 2016-08-08 DIAGNOSIS — S82832A Other fracture of upper and lower end of left fibula, initial encounter for closed fracture: Secondary | ICD-10-CM | POA: Insufficient documentation

## 2016-08-08 DIAGNOSIS — Z7982 Long term (current) use of aspirin: Secondary | ICD-10-CM | POA: Insufficient documentation

## 2016-08-08 DIAGNOSIS — S89092A Other physeal fracture of upper end of left tibia, initial encounter for closed fracture: Secondary | ICD-10-CM | POA: Diagnosis not present

## 2016-08-08 MED ORDER — HYDROCODONE-ACETAMINOPHEN 5-325 MG PO TABS: 1.0000 | ORAL_TABLET | ORAL | 0 refills | Status: DC | PRN

## 2016-08-08 MED ORDER — FENTANYL CITRATE (PF) 100 MCG/2ML IJ SOLN
100.0000 ug | Freq: Once | INTRAMUSCULAR | Status: AC | PRN
  Administered 2016-08-08: 100 ug via INTRAVENOUS
  Filled 2016-08-08: qty 2

## 2016-08-08 NOTE — ED Triage Notes (Signed)
He states he fell in his garage upon exiting a vehicle when his foot slipped on a "wet spot".  He landed on ant. Left knee, where he has pain/abrasion. He is in no distress. He rec'd. 4 of Zofran and 150 mcg of Fentanyl en route.

## 2016-08-08 NOTE — ED Provider Notes (Signed)
[facility] DEPT Provider Note   CSN: [id number] Arrival date & time: 08/08/16  1512     History   Chief Complaint No chief complaint on file.   HPI Paul [name] is a 75 y.o. male.  Patient is 75 yo M with multiple chronic medical conditions as listed below (all currently well controlled), presenting to ED via EMS after he tripped in his garage and injured his left lower extremity about one hour PTA. Patient states he slipped on water, twisted his knee, and landed awkwardly with his left leg bent underneath him. Denies hitting his head or any LOC. He rates his pain 4/10 (he received 150 mcg Fentanyl via EMS) and has no other complaints. Denies any shortness of breath, chest pain, palpitations, swelling, pain out of proportion in lower extremity, numbness, weakness, dizziness, or syncope. 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      Past Medical History:  Diagnosis Date  . Asthma   . Atrial fibrillation ([place])   . CAD (coronary artery disease)    Prior PTCA  . COPD (chronic obstructive pulmonary disease) ([place])   . Diabetes mellitus   . GERD (gastroesophageal reflux disease)   . Hyperlipidemia   . Hypertension   . lung ca dx'd 01/2006  . Nephrolithiasis   . Osteopenia   . Pulmonary embolus ([place])   . Tremor, essential     Patient Active Problem List   Diagnosis Date Noted  . Chronic respiratory failure with hypoxia ([place]) 09/11/2015  . Morbid obesity ([place]) 09/11/2015  . Chronic respiratory failure ([place]) 03/07/2015  . CAFL (chronic airflow limitation) ([place]) 10/27/2014  . Diabetes mellitus, type 2 ([place]) 10/27/2014  . Diabetes mellitus type 2, uncontrolled ([place]) 10/27/2014  . Acid reflux 10/27/2014  . Benign essential HTN 10/27/2014  . History of primary bronchial cancer 10/27/2014  . Extreme obesity ([place]) 10/27/2014  . OP (osteoporosis) 10/27/2014  . Peripheral neuralgia 10/27/2014  . Hypercholesterolemia without  hypertriglyceridemia 10/27/2014  . Compulsive tobacco user syndrome 10/27/2014  . Cough 06/21/2014  . Bronchitis 06/10/2014  . CAD (coronary artery disease) 04/08/2012  . Hyperlipidemia 04/08/2012  . Hypertension 04/08/2012  . Cigarette smoker 04/08/2012  . AF (paroxysmal atrial fibrillation) ([place]) 03/27/2012  . Acute pulmonary embolism ([place]) 03/25/2012  . Exercise hypoxemia 03/25/2012  . COPD GOLD II/ still smoking  03/25/2012  . DM (diabetes mellitus), type 2, uncontrolled with complications ([place]) 00/45/9977  . Malignant neoplasm of bronchus and lung, unspecified site 10/11/2011    Past Surgical History:  Procedure Laterality Date  . Angioplasty  Approx 1993  . CARDIAC CATHETERIZATION  Approximately 1993  . CHOLECYSTECTOMY    . 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 FIXATION KYPHOPLASTY    . LUNG CANCER SURGERY  01/2006   Small excision of left lung tissue; mesh with radioactive seeds (per pt report)  . ROTATOR CUFF TEAR  2016  . TONSILLECTOMY         Home Medications    Prior to Admission medications   Medication Sig Start Date End Date Taking? Authorizing Provider  albuterol (PROAIR HFA) 108 (90 BASE) MCG/ACT inhaler Inhale 2 puffs into the lungs every 6 (six) hours as needed for wheezing or shortness of breath.    Historical Provider, MD  ASPIRIN-CALCIUM CARBONATE PO Take 1 tablet by mouth daily.    Historical Provider, MD  budesonide-formoterol (SYMBICORT) 160-4.5 MCG/ACT inhaler Take 2 puffs first thing in am and then another 2 puffs about 12  hours later. 01/26/16   Tanda Rockers, MD  calcium carbonate (OS-CAL) 600 MG TABS tablet Take 600 mg by mouth 2 (two) times daily with a meal.    Historical Provider, MD  fexofenadine (ALLEGRA) 180 MG tablet Take 180 mg by mouth daily.      Historical Provider, MD  fluticasone (FLONASE) 50 MCG/ACT nasal spray Place 2 sprays into the nose daily.      Historical Provider, MD  Fluticasone-Salmeterol (ADVAIR HFA IN) Inhale 1 puff into  the lungs at bedtime.    Historical Provider, MD  gabapentin (NEURONTIN) 300 MG capsule Take 6 capsules by mouth at bedtime 10/25/15   Dennie Bible, NP  glimepiride (AMARYL) 2 MG tablet 1 tablet 2 (two) times daily. 04/28/16   Historical Provider, MD  guaiFENesin (MUCINEX) 600 MG 12 hr tablet Take 1,200 mg by mouth daily.    Historical Provider, MD  hydrochlorothiazide (HYDRODIURIL) 12.5 MG tablet Take 12.5 mg by mouth daily.      Historical Provider, MD  losartan (COZAAR) 50 MG tablet Take 1 tablet (50 mg total) by mouth daily. 10/21/13   Tanda Rockers, MD  metFORMIN (GLUCOPHAGE) 1000 MG tablet Take 1,000 mg by mouth 2 (two) times daily with a meal.      Historical Provider, MD  metoCLOPramide (REGLAN) 10 MG tablet Take 10 mg by mouth 2 (two) times daily.    Historical Provider, MD  Multiple Vitamin (MULTIVITAMIN) tablet Take 1 tablet by mouth daily. Centrum silver     Historical Provider, MD  Multiple Vitamins-Minerals (CENTRUM SILVER PO) Take by mouth daily.    Historical Provider, MD  OXYGEN 2lpm sleep and 3lpm with exertion  [employer]    Historical Provider, MD  pioglitazone (ACTOS) 45 MG tablet Take 45 mg by mouth daily.      Historical Provider, MD  pravastatin (PRAVACHOL) 40 MG tablet Take 1 tablet (40 mg total) by mouth daily. 09/13/14   Lelon Perla, MD  Vitamin D, Ergocalciferol, (DRISDOL) 50000 UNITS CAPS capsule Take 50,000 Units by mouth every 7 (seven) days.    Historical Provider, MD    Family History Family History  Problem Relation Age of Onset  . Heart disease Mother   . Tremor Mother   . Dementia Mother   . Heart Problems Father     Social History Social History  Substance Use Topics  . Smoking status: Current Every Day Smoker    Packs/day: 0.50    Years: 59.00    Types: Cigarettes  . Smokeless tobacco: Never Used  . Alcohol use 0.6 oz/week    1 Glasses of wine per week     Comment: Rare     Allergies   Codeine and Phenobarbital   Review of  Systems Review of Systems  All other systems reviewed and are negative.    Physical Exam Updated Vital Signs There were no vitals taken for this visit.  Physical Exam  Constitutional:  WDWN elderly man, resting comfortably in bed with left leg elevated on pillow, in no acute distress   HENT:  Head: Normocephalic and atraumatic.  Eyes: Conjunctivae are normal.  Neck: Normal range of motion.  No cervical tenderness  Cardiovascular: Normal rate, regular rhythm, normal heart sounds and intact distal pulses.   Palpable bilateral DP and popliteal pulses  Pulmonary/Chest: Effort normal and breath sounds normal. No respiratory distress.  2L nasal cannula for comfort, oxygenating 94%  Abdominal: Soft. There is no tenderness.  Musculoskeletal: He exhibits no edema.  Obvious deformity at left tibial plateau. No shortening/external rotation of left hip noted. ROM LLE not assessed due to pain and deformity. Compartments soft. Normal exam RLE and BUE.  Neurological: He is alert. He has normal strength. No sensory deficit.  Skin: Skin is warm and dry.  Superficial abrasion to right forearm.  Psychiatric: He has a normal mood and affect.  Nursing note and vitals reviewed.    ED Treatments / Results  Labs (all labs ordered are listed, but only abnormal results are displayed) Labs Reviewed - No data to display  EKG  EKG Interpretation None       Radiology No results found.  Procedures Procedures (including critical care time)  Medications Ordered in ED Medications  fentaNYL (SUBLIMAZE) injection 100 mcg (not administered)     Initial Impression / Assessment and Plan / ED Course  I have reviewed the triage vital signs and the nursing notes.  Pertinent labs & imaging results that were available during my care of the patient were reviewed by me and considered in my medical decision making (see chart for details).  Clinical Course   Patient is 74 yo M who slipped and fell,  injuring his left leg PTA. Obvious deformity noted to left tibial plateau, but neurovascularly intact and no concern for compartment syndrome. X-rays of left knee and tib/fib obtained, which shows minimally displaced proximal tibial metaphyseal fracture and distal fibular metaphyseal fracture. Pain well controlled, and 100 mcg fentanyl ordered.   Patient placed in long leg splint, and consult to orthopedic surgery placed by attending physician, Dr. Vanita Panda. Orthopedic surgeon, Dr. Percell Miller, agreed to see patient in office tomorrow. Nursing order placed for patient to ambulate with walker before d/c home. Patient ambulated without difficulty, and agreed to schedule appointment with Dr. Percell Miller. Prescription Norco 5-325 mg provided, with instructions to return to ED for worsening pain, swelling, numbness, and tingling of extremities.  Final Clinical Impressions(s) / ED Diagnoses   Final diagnoses:  Deformity of left tibia  Tibial fracture, left, closed, initial encounter  Fibula fracture, left, closed, initial encounter    New Prescriptions New Prescriptions   HYDROCODONE-ACETAMINOPHEN (NORCO/VICODIN) 5-325 MG TABLET    Take 1 tablet by mouth every 4 (four) hours as needed.     [place], [place] 08/08/16 [place], MD 08/10/16 [phone]

## 2016-08-08 NOTE — ED Notes (Signed)
Bed: WA06 Expected date:  Expected time:  Means of arrival:  Comments: EMS-knee deformity

## 2016-08-09 ENCOUNTER — Emergency Department (HOSPITAL_COMMUNITY): Payer: Medicare Other

## 2016-08-09 ENCOUNTER — Inpatient Hospital Stay (HOSPITAL_COMMUNITY)
Admission: EM | Admit: 2016-08-09 | Discharge: 2016-08-11 | DRG: 563 | Disposition: A | Discharge status: Skilled Nursing Facility | Payer: Medicare Other | Attending: Family Medicine | Admitting: Family Medicine

## 2016-08-09 ENCOUNTER — Encounter (HOSPITAL_COMMUNITY): Payer: Self-pay

## 2016-08-09 DIAGNOSIS — S82222A Displaced transverse fracture of shaft of left tibia, initial encounter for closed fracture: Principal | ICD-10-CM | POA: Diagnosis present

## 2016-08-09 DIAGNOSIS — M792 Neuralgia and neuritis, unspecified: Secondary | ICD-10-CM | POA: Diagnosis present

## 2016-08-09 DIAGNOSIS — J9611 Chronic respiratory failure with hypoxia: Secondary | ICD-10-CM

## 2016-08-09 DIAGNOSIS — I480 Paroxysmal atrial fibrillation: Secondary | ICD-10-CM | POA: Diagnosis not present

## 2016-08-09 DIAGNOSIS — Z7951 Long term (current) use of inhaled steroids: Secondary | ICD-10-CM | POA: Diagnosis not present

## 2016-08-09 DIAGNOSIS — Z6838 Body mass index (BMI) 38.0-38.9, adult: Secondary | ICD-10-CM | POA: Diagnosis not present

## 2016-08-09 DIAGNOSIS — J449 Chronic obstructive pulmonary disease, unspecified: Secondary | ICD-10-CM | POA: Diagnosis not present

## 2016-08-09 DIAGNOSIS — E7800 Pure hypercholesterolemia, unspecified: Secondary | ICD-10-CM | POA: Diagnosis present

## 2016-08-09 DIAGNOSIS — Z85118 Personal history of other malignant neoplasm of bronchus and lung: Secondary | ICD-10-CM

## 2016-08-09 DIAGNOSIS — S82202A Unspecified fracture of shaft of left tibia, initial encounter for closed fracture: Secondary | ICD-10-CM | POA: Diagnosis not present

## 2016-08-09 DIAGNOSIS — Z885 Allergy status to narcotic agent status: Secondary | ICD-10-CM

## 2016-08-09 DIAGNOSIS — S82832A Other fracture of upper and lower end of left fibula, initial encounter for closed fracture: Secondary | ICD-10-CM | POA: Diagnosis not present

## 2016-08-09 DIAGNOSIS — Z7984 Long term (current) use of oral hypoglycemic drugs: Secondary | ICD-10-CM | POA: Diagnosis not present

## 2016-08-09 DIAGNOSIS — I10 Essential (primary) hypertension: Secondary | ICD-10-CM | POA: Diagnosis not present

## 2016-08-09 DIAGNOSIS — R531 Weakness: Secondary | ICD-10-CM | POA: Diagnosis present

## 2016-08-09 DIAGNOSIS — I4819 Other persistent atrial fibrillation: Secondary | ICD-10-CM

## 2016-08-09 DIAGNOSIS — Z7982 Long term (current) use of aspirin: Secondary | ICD-10-CM

## 2016-08-09 DIAGNOSIS — S8252XA Displaced fracture of medial malleolus of left tibia, initial encounter for closed fracture: Secondary | ICD-10-CM | POA: Diagnosis present

## 2016-08-09 DIAGNOSIS — K219 Gastro-esophageal reflux disease without esophagitis: Secondary | ICD-10-CM | POA: Diagnosis not present

## 2016-08-09 DIAGNOSIS — E1165 Type 2 diabetes mellitus with hyperglycemia: Secondary | ICD-10-CM | POA: Diagnosis present

## 2016-08-09 DIAGNOSIS — R0602 Shortness of breath: Secondary | ICD-10-CM | POA: Diagnosis not present

## 2016-08-09 DIAGNOSIS — Z86711 Personal history of pulmonary embolism: Secondary | ICD-10-CM | POA: Diagnosis not present

## 2016-08-09 DIAGNOSIS — W1830XA Fall on same level, unspecified, initial encounter: Secondary | ICD-10-CM | POA: Diagnosis present

## 2016-08-09 DIAGNOSIS — G250 Essential tremor: Secondary | ICD-10-CM | POA: Diagnosis present

## 2016-08-09 DIAGNOSIS — Z79899 Other long term (current) drug therapy: Secondary | ICD-10-CM | POA: Diagnosis not present

## 2016-08-09 DIAGNOSIS — S82209A Unspecified fracture of shaft of unspecified tibia, initial encounter for closed fracture: Secondary | ICD-10-CM | POA: Diagnosis present

## 2016-08-09 DIAGNOSIS — IMO0002 Reserved for concepts with insufficient information to code with codable children: Secondary | ICD-10-CM | POA: Diagnosis present

## 2016-08-09 DIAGNOSIS — S8292XA Unspecified fracture of left lower leg, initial encounter for closed fracture: Secondary | ICD-10-CM | POA: Diagnosis not present

## 2016-08-09 DIAGNOSIS — F17210 Nicotine dependence, cigarettes, uncomplicated: Secondary | ICD-10-CM | POA: Diagnosis present

## 2016-08-09 DIAGNOSIS — I481 Persistent atrial fibrillation: Secondary | ICD-10-CM | POA: Diagnosis not present

## 2016-08-09 DIAGNOSIS — S89092A Other physeal fracture of upper end of left tibia, initial encounter for closed fracture: Secondary | ICD-10-CM | POA: Diagnosis not present

## 2016-08-09 DIAGNOSIS — S82892A Other fracture of left lower leg, initial encounter for closed fracture: Secondary | ICD-10-CM | POA: Diagnosis not present

## 2016-08-09 DIAGNOSIS — G629 Polyneuropathy, unspecified: Secondary | ICD-10-CM | POA: Diagnosis present

## 2016-08-09 DIAGNOSIS — M79605 Pain in left leg: Secondary | ICD-10-CM | POA: Diagnosis not present

## 2016-08-09 DIAGNOSIS — G609 Hereditary and idiopathic neuropathy, unspecified: Secondary | ICD-10-CM

## 2016-08-09 DIAGNOSIS — I2510 Atherosclerotic heart disease of native coronary artery without angina pectoris: Secondary | ICD-10-CM | POA: Diagnosis present

## 2016-08-09 DIAGNOSIS — M810 Age-related osteoporosis without current pathological fracture: Secondary | ICD-10-CM | POA: Diagnosis present

## 2016-08-09 DIAGNOSIS — S82112A Displaced fracture of left tibial spine, initial encounter for closed fracture: Secondary | ICD-10-CM | POA: Diagnosis not present

## 2016-08-09 DIAGNOSIS — Z888 Allergy status to other drugs, medicaments and biological substances status: Secondary | ICD-10-CM

## 2016-08-09 DIAGNOSIS — S82435A Nondisplaced oblique fracture of shaft of left fibula, initial encounter for closed fracture: Secondary | ICD-10-CM | POA: Diagnosis present

## 2016-08-09 DIAGNOSIS — J9610 Chronic respiratory failure, unspecified whether with hypoxia or hypercapnia: Secondary | ICD-10-CM | POA: Diagnosis present

## 2016-08-09 DIAGNOSIS — Z9861 Coronary angioplasty status: Secondary | ICD-10-CM | POA: Diagnosis not present

## 2016-08-09 DIAGNOSIS — S82102A Unspecified fracture of upper end of left tibia, initial encounter for closed fracture: Secondary | ICD-10-CM | POA: Diagnosis not present

## 2016-08-09 DIAGNOSIS — J9620 Acute and chronic respiratory failure, unspecified whether with hypoxia or hypercapnia: Secondary | ICD-10-CM | POA: Diagnosis present

## 2016-08-09 LAB — CBC WITH DIFFERENTIAL/PLATELET
Basophils Absolute: 0 10*3/uL (ref 0.0–0.1)
Basophils Relative: 0 %
Eosinophils Absolute: 0.1 10*3/uL (ref 0.0–0.7)
Eosinophils Relative: 1 %
HCT: 38.7 % — ABNORMAL LOW (ref 39.0–52.0)
Hemoglobin: 12.9 g/dL — ABNORMAL LOW (ref 13.0–17.0)
Lymphocytes Relative: 10 %
Lymphs Abs: 0.9 10*3/uL (ref 0.7–4.0)
MCH: 27.7 pg (ref 26.0–34.0)
MCHC: 33.3 g/dL (ref 30.0–36.0)
MCV: 83 fL (ref 78.0–100.0)
Monocytes Absolute: 1 10*3/uL (ref 0.1–1.0)
Monocytes Relative: 11 %
Neutro Abs: 7.1 10*3/uL (ref 1.7–7.7)
Neutrophils Relative %: 78 %
Platelets: 201 10*3/uL (ref 150–400)
RBC: 4.66 MIL/uL (ref 4.22–5.81)
RDW: 15.3 % (ref 11.5–15.5)
WBC: 9 10*3/uL (ref 4.0–10.5)

## 2016-08-09 LAB — PROTIME-INR
INR: 0.99
Prothrombin Time: 13.1 seconds (ref 11.4–15.2)

## 2016-08-09 LAB — BASIC METABOLIC PANEL
Anion gap: 7 (ref 5–15)
BUN: 17 mg/dL (ref 6–20)
CO2: 25 mmol/L (ref 22–32)
Calcium: 8.9 mg/dL (ref 8.9–10.3)
Chloride: 102 mmol/L (ref 101–111)
Creatinine, Ser: 0.72 mg/dL (ref 0.61–1.24)
GFR calc Af Amer: >60 mL/min (ref >60)
GFR calc non Af Amer: >60 mL/min (ref >60)
Glucose, Bld: 222 mg/dL — ABNORMAL HIGH (ref 65–99)
Potassium: 3.6 mmol/L (ref 3.5–5.1)
Sodium: 134 mmol/L — ABNORMAL LOW (ref 135–145)

## 2016-08-09 LAB — CK: Total CK: 130 U/L (ref 49–397)

## 2016-08-09 LAB — GLUCOSE, CAPILLARY
Glucose-Capillary: 172 mg/dL — ABNORMAL HIGH (ref 65–99)
Glucose-Capillary: 158 mg/dL — ABNORMAL HIGH (ref 65–99)

## 2016-08-09 MED ORDER — MORPHINE SULFATE (PF) 2 MG/ML IV SOLN: 2.0000 mg | INTRAVENOUS | Status: DC | PRN

## 2016-08-09 MED ORDER — DOCUSATE SODIUM 100 MG PO CAPS
100.0000 mg | ORAL_CAPSULE | Freq: Two times a day (BID) | ORAL | Status: DC
  Administered 2016-08-09 – 2016-08-11 (×3): 100 mg via ORAL
  Filled 2016-08-09 (×3): qty 1

## 2016-08-09 MED ORDER — ONDANSETRON HCL 4 MG/2ML IJ SOLN
4.0000 mg | Freq: Once | INTRAMUSCULAR | Status: DC
  Filled 2016-08-09: qty 2

## 2016-08-09 MED ORDER — HYDROMORPHONE HCL 1 MG/ML IJ SOLN
1.0000 mg | Freq: Once | INTRAMUSCULAR | Status: AC
  Administered 2016-08-09: 1 mg via INTRAVENOUS
  Filled 2016-08-09: qty 1

## 2016-08-09 MED ORDER — ONDANSETRON HCL 4 MG/2ML IJ SOLN
4.0000 mg | Freq: Once | INTRAMUSCULAR | Status: AC
  Administered 2016-08-09: 4 mg via INTRAVENOUS

## 2016-08-09 MED ORDER — ENOXAPARIN SODIUM 40 MG/0.4ML ~~LOC~~ SOLN
40.0000 mg | SUBCUTANEOUS | Status: DC
  Administered 2016-08-09 – 2016-08-10 (×2): 40 mg via SUBCUTANEOUS
  Filled 2016-08-09 (×2): qty 0.4

## 2016-08-09 MED ORDER — METHOCARBAMOL 500 MG PO TABS: 500.0000 mg | ORAL_TABLET | Freq: Four times a day (QID) | ORAL | Status: DC | PRN

## 2016-08-09 MED ORDER — ALBUTEROL SULFATE (2.5 MG/3ML) 0.083% IN NEBU: 2.5000 mg | INHALATION_SOLUTION | RESPIRATORY_TRACT | Status: DC | PRN

## 2016-08-09 MED ORDER — PANTOPRAZOLE SODIUM 40 MG PO TBEC
40.0000 mg | DELAYED_RELEASE_TABLET | Freq: Every day | ORAL | Status: DC
  Administered 2016-08-09 – 2016-08-11 (×3): 40 mg via ORAL
  Filled 2016-08-09 (×3): qty 1

## 2016-08-09 MED ORDER — ACETAMINOPHEN 650 MG RE SUPP: 650.0000 mg | Freq: Four times a day (QID) | RECTAL | Status: DC | PRN

## 2016-08-09 MED ORDER — GABAPENTIN 400 MG PO CAPS
1200.0000 mg | ORAL_CAPSULE | Freq: Every day | ORAL | Status: DC
  Administered 2016-08-09 – 2016-08-10 (×2): 1200 mg via ORAL
  Filled 2016-08-09 (×2): qty 3

## 2016-08-09 MED ORDER — METOCLOPRAMIDE HCL 10 MG PO TABS
10.0000 mg | ORAL_TABLET | Freq: Two times a day (BID) | ORAL | Status: DC
  Administered 2016-08-09 – 2016-08-11 (×4): 10 mg via ORAL
  Filled 2016-08-09 (×4): qty 1

## 2016-08-09 MED ORDER — MOMETASONE FURO-FORMOTEROL FUM 200-5 MCG/ACT IN AERO
2.0000 | INHALATION_SPRAY | Freq: Two times a day (BID) | RESPIRATORY_TRACT | Status: DC
  Administered 2016-08-09 – 2016-08-11 (×4): 2 via RESPIRATORY_TRACT
  Filled 2016-08-09: qty 8.8

## 2016-08-09 MED ORDER — ACETAMINOPHEN 325 MG PO TABS: 650.0000 mg | ORAL_TABLET | Freq: Four times a day (QID) | ORAL | Status: DC | PRN

## 2016-08-09 MED ORDER — ACETAMINOPHEN 500 MG PO TABS: 1000.0000 mg | ORAL_TABLET | Freq: Four times a day (QID) | ORAL | Status: DC

## 2016-08-09 MED ORDER — PRAVASTATIN SODIUM 40 MG PO TABS
40.0000 mg | ORAL_TABLET | Freq: Every day | ORAL | Status: DC
  Administered 2016-08-09 – 2016-08-10 (×2): 40 mg via ORAL
  Filled 2016-08-09 (×2): qty 2
  Filled 2016-08-09 (×2): qty 1

## 2016-08-09 MED ORDER — SODIUM CHLORIDE 0.9 % IV BOLUS (SEPSIS)
1000.0000 mL | Freq: Once | INTRAVENOUS | Status: AC
  Administered 2016-08-09: 1000 mL via INTRAVENOUS

## 2016-08-09 MED ORDER — ALBUTEROL SULFATE HFA 108 (90 BASE) MCG/ACT IN AERS: 2.0000 | INHALATION_SPRAY | RESPIRATORY_TRACT | Status: DC | PRN

## 2016-08-09 MED ORDER — FLUTICASONE PROPIONATE 50 MCG/ACT NA SUSP
1.0000 | Freq: Two times a day (BID) | NASAL | Status: DC
Start: 2016-08-09 — End: 2016-08-11
  Administered 2016-08-09 – 2016-08-11 (×4): 1 via NASAL
  Filled 2016-08-09: qty 16

## 2016-08-09 MED ORDER — LOSARTAN POTASSIUM 50 MG PO TABS
50.0000 mg | ORAL_TABLET | Freq: Every day | ORAL | Status: DC
  Administered 2016-08-10 – 2016-08-11 (×2): 50 mg via ORAL
  Filled 2016-08-09 (×2): qty 1

## 2016-08-09 MED ORDER — METHOCARBAMOL 1000 MG/10ML IJ SOLN
500.0000 mg | Freq: Four times a day (QID) | INTRAVENOUS | Status: DC | PRN
  Filled 2016-08-09: qty 5

## 2016-08-09 MED ORDER — OXYCODONE HCL 5 MG PO TABS
5.0000 mg | ORAL_TABLET | ORAL | Status: DC | PRN
  Administered 2016-08-10: 5 mg via ORAL
  Administered 2016-08-10 (×3): 10 mg via ORAL
  Administered 2016-08-10: 5 mg via ORAL
  Administered 2016-08-11: 10 mg via ORAL
  Administered 2016-08-11: 5 mg via ORAL
  Filled 2016-08-09: qty 2
  Filled 2016-08-09: qty 1
  Filled 2016-08-09 (×3): qty 2
  Filled 2016-08-09: qty 1
  Filled 2016-08-09: qty 2

## 2016-08-09 MED ORDER — ASPIRIN EC 81 MG PO TBEC
81.0000 mg | DELAYED_RELEASE_TABLET | Freq: Every day | ORAL | Status: DC
  Administered 2016-08-09 – 2016-08-10 (×2): 81 mg via ORAL
  Filled 2016-08-09 (×2): qty 1

## 2016-08-09 MED ORDER — DIAZEPAM 5 MG/ML IJ SOLN
5.0000 mg | Freq: Once | INTRAMUSCULAR | Status: AC
  Administered 2016-08-09: 5 mg via INTRAVENOUS
  Filled 2016-08-09: qty 2

## 2016-08-09 MED ORDER — INSULIN ASPART 100 UNIT/ML ~~LOC~~ SOLN
0.0000 [IU] | Freq: Three times a day (TID) | SUBCUTANEOUS | Status: DC
Start: 2016-08-09 — End: 2016-08-11
  Administered 2016-08-09 – 2016-08-10 (×2): 3 [IU] via SUBCUTANEOUS
  Administered 2016-08-10: 2 [IU] via SUBCUTANEOUS
  Administered 2016-08-10 – 2016-08-11 (×2): 3 [IU] via SUBCUTANEOUS
  Administered 2016-08-11: 5 [IU] via SUBCUTANEOUS
  Filled 2016-08-09 (×3): qty 1

## 2016-08-09 NOTE — ED Notes (Signed)
Bed: WA08 Expected date:  Expected time:  Means of arrival:  Comments: EMS- 75yo M, pain control/recent tibia Fx

## 2016-08-09 NOTE — Consult Note (Signed)
ORTHOPAEDIC CONSULTATION  REQUESTING PHYSICIAN: Nicholes Stairs, MD  PCP:  Marjorie Smolder, MD  Chief Complaint: Left leg fractures  HPI: Paul [name] is a 75 y.o. male who complains of  Left leg pain following a fall yesterday.  He has Pmhx of, CAD, COPD and afib not on blood thinners.  Sustained a fall yesterday and was diagnosed with left proximal tibia and left distal fibula fracture, and sent home with outpt follow up.  Returns to ED today complaining of increased pain and swelling.  Currently he is feeling better pain control and only has pain with leg movement.  Denies new numbness over his baseline peripheral DM neuropathy.   Past Medical History:  Diagnosis Date  . Asthma   . Atrial fibrillation ([place])   . CAD (coronary artery disease)    Prior PTCA  . COPD (chronic obstructive pulmonary disease) ([place])   . Diabetes mellitus   . GERD (gastroesophageal reflux disease)   . Hyperlipidemia   . Hypertension   . lung ca dx'd 01/2006  . Nephrolithiasis   . Osteopenia   . Pulmonary embolus ([place])   . Tremor, essential    Past Surgical History:  Procedure Laterality Date  . Angioplasty  Approx 1993  . CARDIAC CATHETERIZATION  Approximately 1993  . CHOLECYSTECTOMY    . 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 FIXATION KYPHOPLASTY    . LUNG CANCER SURGERY  01/2006   Small excision of left lung tissue; mesh with radioactive seeds (per pt report)  . ROTATOR CUFF TEAR  2016  . TONSILLECTOMY     Social History   Social History  . Marital status: Married    Spouse name: Santiago Glad  . Number of children: 2  . Years of education: college   Occupational History  .  Retired    Retired from [employer] and [employer] [place]  . Smoking status: Current Every Day Smoker    Packs/day: 0.50    Years: 59.00    Types: Cigarettes  . Smokeless tobacco: Never Used  . Alcohol use 0.6 oz/week    1 Glasses of wine per week     Comment: Rare  . Drug use: No  . Sexual  activity: No   Other Topics Concern  . None   Social History Narrative   Patient lives at home with his wife Santiago Glad).   Retired.   Education- College    Right handed.   Caffeine- three cups of coffee daily.   Family History  Problem Relation Age of Onset  . Heart disease Mother   . Tremor Mother   . Dementia Mother   . Heart Problems Father    Allergies  Allergen Reactions  . Codeine Anaphylaxis  . Phenobarbital Hypertension   Prior to Admission medications   Medication Sig Start Date End Date Taking? Authorizing Provider  acetaminophen (TYLENOL) 500 MG tablet Take 1,000 mg by mouth daily with breakfast.   Yes Historical Provider, MD  albuterol (PROAIR HFA) 108 (90 BASE) MCG/ACT inhaler Inhale 2 puffs into the lungs every 2 (two) hours as needed for wheezing or shortness of breath.    Yes Historical Provider, MD  aspirin 81 MG tablet Take 81 mg by mouth daily with supper.   Yes Historical Provider, MD  budesonide-formoterol (SYMBICORT) 160-4.5 MCG/ACT inhaler Take 2 puffs first thing in am and then another 2 puffs about 12 hours later. Patient taking differently: Inhale 2 puffs into the lungs daily with  breakfast.  01/26/16  Yes Tanda Rockers, MD  calcium carbonate (OS-CAL) 600 MG TABS tablet Take 600 mg by mouth daily with supper.    Yes Historical Provider, MD  fexofenadine (ALLEGRA) 180 MG tablet Take 180 mg by mouth daily with breakfast.    Yes Historical Provider, MD  fluticasone (FLONASE) 50 MCG/ACT nasal spray Place 1 spray into the nose 2 (two) times daily.    Yes Historical Provider, MD  Fluticasone-Salmeterol (ADVAIR) 250-50 MCG/DOSE AEPB Inhale 1 puff into the lungs at bedtime.   Yes Historical Provider, MD  gabapentin (NEURONTIN) 300 MG capsule Take 6 capsules by mouth at bedtime Patient taking differently: Take 1,200 mg by mouth at bedtime.  10/25/15  Yes Dennie Bible, NP  glimepiride (AMARYL) 2 MG tablet Take 2 mg by mouth 2 (two) times daily.  04/28/16  Yes  Historical Provider, MD  guaiFENesin (MUCINEX) 600 MG 12 hr tablet Take 1,200 mg by mouth daily with breakfast.    Yes Historical Provider, MD  hydrochlorothiazide (HYDRODIURIL) 12.5 MG tablet Take 12.5 mg by mouth daily with breakfast.    Yes Historical Provider, MD  HYDROcodone-acetaminophen (NORCO/VICODIN) 5-325 MG tablet Take 1 tablet by mouth every 4 (four) hours as needed. 08/08/16  Yes Daryl F de Villier II, PA  losartan (COZAAR) 50 MG tablet Take 1 tablet (50 mg total) by mouth daily. Patient taking differently: Take 50 mg by mouth daily with breakfast.  10/21/13  Yes Tanda Rockers, MD  metFORMIN (GLUCOPHAGE) 1000 MG tablet Take 1,000 mg by mouth 2 (two) times daily with a meal.     Yes Historical Provider, MD  metoCLOPramide (REGLAN) 10 MG tablet Take 10 mg by mouth 2 (two) times daily.   Yes Historical Provider, MD  Multiple Vitamins-Minerals (CENTRUM SILVER ADULT 50+) TABS Take 1 tablet by mouth daily with breakfast.   Yes Historical Provider, MD  OXYGEN Place 2 L into the nose at bedtime. 2L of oxygen at night and 2L of oxygen when walking long distances   Yes Historical Provider, MD  pioglitazone (ACTOS) 45 MG tablet Take 45 mg by mouth daily with breakfast.    Yes Historical Provider, MD  pravastatin (PRAVACHOL) 40 MG tablet Take 1 tablet (40 mg total) by mouth daily. Patient taking differently: Take 40 mg by mouth at bedtime.  09/13/14  Yes Lelon Perla, MD  Vitamin D, Ergocalciferol, (DRISDOL) 50000 UNITS CAPS capsule Take 50,000 Units by mouth every Saturday.    Yes Historical Provider, MD   Dg Tibia/fibula Left  Result Date: 08/08/2016 CLINICAL DATA:  Fall, pain from the knee to the ankle, deformity. EXAM: LEFT TIBIA AND FIBULA - 2 VIEW COMPARISON:  None. FINDINGS: There is a minimally displaced fracture of the proximal tibial metaphysis, incompletely imaged on the lateral view. A nondisplaced fracture of the distal fibular metaphysis is seen as well. IMPRESSION: 1. Minimally  displaced proximal tibial metaphyseal fracture, suboptimally imaged. Dedicated views of the left knee are recommended in further initial evaluation. If a more aggressive approach is desired, CT without contrast is suggested. 2. Distal fibular metaphyseal fracture. Electronically Signed   By: Lorin Picket M.D.   On: 08/08/2016 16:43   Dg Ankle Complete Left  Result Date: 08/09/2016 CLINICAL DATA:  [place] yesterday with pain EXAM: LEFT ANKLE COMPLETE - 3+ VIEW COMPARISON:  Left tibia films of 08/08/2016 FINDINGS: There is a nondisplaced fracture of the distal left fibula. Also there is a small avulsion fracture fragment from the tip of  the medial malleolus. The ankle joint is unremarkable. Alignment is normal. IMPRESSION: 1. Oblique nondisplaced fracture through the distal left fibula. 2. Small avulsion fracture fragment from the tip of the medial malleolus. Electronically Signed   By: Ivar Drape M.D.   On: 08/09/2016 13:21   Ct Knee Left Wo Contrast  Result Date: 08/09/2016 CLINICAL DATA:  Evaluate proximal tibial fracture. EXAM: CT OF THE left KNEE WITHOUT CONTRAST TECHNIQUE: Multidetector CT imaging of the left knee was performed according to the standard protocol. Multiplanar CT image reconstructions were also generated. COMPARISON:  Radiographs 08/08/2016. FINDINGS: Bones/Joint/Cartilage There is the largely transverse fracture through the metaphyseal region of the tibia. I do not see definite involvement of the articular surface of the tibia. Anteriorly the fracture courses through the base of the tibial tuberosity and because of the pull of the patellar tendon this segment of the fracture is displaced/the avulsed. Maximum displacement is 17 mm. Small joint effusion.  Mild degenerative changes. The femur is intact.  No patella fracture.  The fibula is intact. Ligaments Suboptimally assessed by CT. Muscles and Tendons The quadriceps and patellar tendons appear to be intact. No significant or obvious  muscle injury. Soft tissues Significant subcutaneous soft tissue swelling/ edema/ hematoma involving the anterior aspect of the. IMPRESSION: 1. Largely transverse fracture through the metaphyseal region of the proximal tibia within a displaced/avulsed segment of the tibial tuberosity by the pull of the patellar tendon. 2. No evidence of involvement of the tibial plateau. 3. Small joint effusion. Electronically Signed   By: Marijo Sanes M.D.   On: 08/09/2016 13:13   Dg Knee Complete 4 Views Left  Result Date: 08/08/2016 CLINICAL DATA:  Slipped and fell in the garage today EXAM: LEFT KNEE - COMPLETE 4+ VIEW COMPARISON:  None. FINDINGS: Four views of the left knee submitted. There is mild displaced metaphyseal fracture in proximal left tibia. Degenerative changes are noted left knee joint. IMPRESSION: Mild displaced metaphysis fracture in proximal left tibia. Electronically Signed   By: Lahoma Crocker M.D.   On: 08/08/2016 16:41    Positive ROS: All other systems have been reviewed and were otherwise negative with the exception of those mentioned in the HPI and as above.  Physical Exam: General: Alert, no acute distress Cardiovascular: No pedal edema Respiratory: No cyanosis, no use of accessory musculature GI: No organomegaly, abdomen is soft and non-tender Skin: No lesions in the area of chief complaint Neurologic: Sensation intact distally Psychiatric: Patient is competent for consent with normal mood and affect Lymphatic: No axillary or cervical lymphadenopathy  MUSCULOSKELETAL: LLE- ecchymosis over proximal tibia, mild sweling, edema at ankle and foot with healing callus noted along plantar foot beneath navicular.  SILT throughout but decreased 2/2 neuropathy. Pt actively wiggles toes, 1+ DP and PT pulse, no pain with passive stretch, compartments soft  Assessment: Left tibial metaphysis fracture with left distal fibula fracture  Plan: -pt needs ankle films and left knee CT scan -NWB  LLE -no compartment syndrome at this time, needs aggressive elevation -Long leg splint per ED -Given patients swelling and DM would recommend delayed fixation of fractures next week -will admit for PT eval and pain control with goal to get pt home and follow up next week for skin check before operative fixation. -appreciate medicine consult for preop risk stratification -lovenox for dvt ppx until surgery. -DM diet ok    Nicholes Stairs, MD Cell [phone]    08/09/2016 2:03 PM

## 2016-08-09 NOTE — ED Notes (Signed)
Report given. Pt can now go upstairs

## 2016-08-09 NOTE — ED Triage Notes (Signed)
Pt seen yesterday for fall that resulted in Left Tib/Fib Fracture. Pt unable to care for self at home. Fentanyl 162mg by EMS.

## 2016-08-09 NOTE — Consult Note (Signed)
[facility] Consult Note  Paul [name] [id number]:[id number]:[id number]   Patients out patient PCP is Paul Smolder, MD Consult requested in the Hospital by Paul Stairs, MD, On 08/09/2016  Reason for consult: Evaluation and management recommendations for medical problems and preop clearance.  With History of - Principal Problem:   Tibial fracture Active Problems:   COPD GOLD II/ still smoking    AF (paroxysmal atrial fibrillation) ([facility])   CAD (coronary artery disease)   Hypertension   CAFL (chronic airflow limitation) ([facility])   Diabetes mellitus type 2, uncontrolled ([facility])   Acid reflux   Benign essential HTN   OP (osteoporosis)   Peripheral neuralgia   Chronic respiratory failure ([facility])   Morbid obesity ([facility])   Tibia fracture  Past Medical History:  Diagnosis Date  . Asthma   . Atrial fibrillation ([place])   . CAD (coronary artery disease)    Prior PTCA  . COPD (chronic obstructive pulmonary disease) ([place])   . Diabetes mellitus   . GERD (gastroesophageal reflux disease)   . Hyperlipidemia   . Hypertension   . lung ca dx'd 01/2006  . Nephrolithiasis   . Osteopenia   . Pulmonary embolus ([place])   . Tremor, essential      Past Surgical History:  Procedure Laterality Date  . Angioplasty  Approx 1993  . CARDIAC CATHETERIZATION  Approximately 1993  . CHOLECYSTECTOMY    . 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 FIXATION KYPHOPLASTY    . LUNG CANCER SURGERY  01/2006   Small excision of left lung tissue; mesh with radioactive seeds (per pt report)  . ROTATOR CUFF TEAR  2016  . TONSILLECTOMY      Past Surgical History:  Procedure Laterality Date  . Angioplasty  Approx 1993  . CARDIAC CATHETERIZATION  Approximately 1993  . CHOLECYSTECTOMY    . 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 FIXATION KYPHOPLASTY    . LUNG CANCER SURGERY  01/2006   Small excision of left lung tissue; mesh with radioactive seeds (per pt report)  . ROTATOR CUFF TEAR  2016  .  TONSILLECTOMY      HPI:-  Paul [name] [id number] is a 75 y.o. male,hx of CAD, COPD, paroxysmal afib not on blood thinners here with persistent L leg pain. He had a mechanical fall yesterday and twisted his left knee and landed on his left leg. States that the leg that behind him and he had a proximal tibial fracture as well as a distal fibula fracture on x-ray. He was seen in ED yesterday and orthopedic doctor Paul [name]) was called and wanted to have him follow-up outpatient for an elective surgery. He was also placed in a splint and was sent home with Vicodin. Overnight, he states that he's been taking the Vicodin but there is progressively worsening swelling of the left leg. Also he states that his pain is not controlled with the Vicodin. He is on oxygen as needed for COPD and denies any worsening shortness of breath compared to baseline. He called EMS due to severe pain and was given 100 g of fentanyl en route. He was admitted by orthopedist Dr. Stann [name] and a medical consult was requested for preop evaluation and optimization of medical issues.   Review of Systems   As noted in HPI otherwise all reviewed and negative  Social History Social History  Substance Use Topics  . Smoking status: Current Every Day Smoker    Packs/day: 0.50    Years: 59.00  Types: Cigarettes  . Smokeless tobacco: Never Used  . Alcohol use 0.6 oz/week    1 Glasses of wine per week     Comment: Rare   Family History Family History  Problem Relation Age of Onset  . Heart disease Mother   . Tremor Mother   . Dementia Mother   . Heart Problems Father     Prior to Admission medications   Medication Sig Start Date End Date Taking? Authorizing Provider  acetaminophen (TYLENOL) 500 MG tablet Take 1,000 mg by mouth daily with breakfast.   Yes Historical Provider, MD  albuterol (PROAIR HFA) 108 (90 BASE) MCG/ACT inhaler Inhale 2 puffs into the lungs every 2 (two) hours as needed for wheezing or  shortness of breath.    Yes Historical Provider, MD  aspirin 81 MG tablet Take 81 mg by mouth daily with supper.   Yes Historical Provider, MD  budesonide-formoterol (SYMBICORT) 160-4.5 MCG/ACT inhaler Take 2 puffs first thing in am and then another 2 puffs about 12 hours later. Patient taking differently: Inhale 2 puffs into the lungs daily with breakfast.  01/26/16  Yes Tanda Rockers, MD  calcium carbonate (OS-CAL) 600 MG TABS tablet Take 600 mg by mouth daily with supper.    Yes Historical Provider, MD  fexofenadine (ALLEGRA) 180 MG tablet Take 180 mg by mouth daily with breakfast.    Yes Historical Provider, MD  fluticasone (FLONASE) 50 MCG/ACT nasal spray Place 1 spray into the nose 2 (two) times daily.    Yes Historical Provider, MD  Fluticasone-Salmeterol (ADVAIR) 250-50 MCG/DOSE AEPB Inhale 1 puff into the lungs at bedtime.   Yes Historical Provider, MD  gabapentin (NEURONTIN) 300 MG capsule Take 6 capsules by mouth at bedtime Patient taking differently: Take 1,200 mg by mouth at bedtime.  10/25/15  Yes Dennie Bible, NP  glimepiride (AMARYL) 2 MG tablet Take 2 mg by mouth 2 (two) times daily.  04/28/16  Yes Historical Provider, MD  guaiFENesin (MUCINEX) 600 MG 12 hr tablet Take 1,200 mg by mouth daily with breakfast.    Yes Historical Provider, MD  hydrochlorothiazide (HYDRODIURIL) 12.5 MG tablet Take 12.5 mg by mouth daily with breakfast.    Yes Historical Provider, MD  HYDROcodone-acetaminophen (NORCO/VICODIN) 5-325 MG tablet Take 1 tablet by mouth every 4 (four) hours as needed. 08/08/16  Yes Daryl F de Villier II, PA  losartan (COZAAR) 50 MG tablet Take 1 tablet (50 mg total) by mouth daily. Patient taking differently: Take 50 mg by mouth daily with breakfast.  10/21/13  Yes Tanda Rockers, MD  metFORMIN (GLUCOPHAGE) 1000 MG tablet Take 1,000 mg by mouth 2 (two) times daily with a meal.     Yes Historical Provider, MD  metoCLOPramide (REGLAN) 10 MG tablet Take 10 mg by mouth 2  (two) times daily.   Yes Historical Provider, MD  Multiple Vitamins-Minerals (CENTRUM SILVER ADULT 50+) TABS Take 1 tablet by mouth daily with breakfast.   Yes Historical Provider, MD  OXYGEN Place 2 L into the nose at bedtime. 2L of oxygen at night and 2L of oxygen when walking long distances   Yes Historical Provider, MD  pioglitazone (ACTOS) 45 MG tablet Take 45 mg by mouth daily with breakfast.    Yes Historical Provider, MD  pravastatin (PRAVACHOL) 40 MG tablet Take 1 tablet (40 mg total) by mouth daily. Patient taking differently: Take 40 mg by mouth at bedtime.  09/13/14  Yes Lelon Perla, MD  Vitamin D, Ergocalciferol, (DRISDOL)  50000 UNITS CAPS capsule Take 50,000 Units by mouth every Saturday.    Yes Historical Provider, MD    Allergies  Allergen Reactions  . Codeine Anaphylaxis  . Phenobarbital Hypertension   Physical Exam No intake or output data in the 24 hours ending 08/09/16 1627 Blood pressure 140/76, pulse 75, temperature 98.9 F (37.2 C), temperature source Oral, resp. rate 16, height _0  (1.702 m), weight 112.5 kg (248 lb), SpO2 96 %.  Constitutional: Uncomfortable no apparent distress  HENT: Head: Normocephalic. MMM.  Eyes: Pupils are equal, round, and reactive to light.  Neck: Normal range of motion.  Cardiovascular: Normal rate, regular rhythm and normal heart sounds.   Pulmonary/Chest: Effort normal. Bibasilar expiratory wheezes heard.  Diminished, no obvious wheezing   Abdominal: Soft. Bowel sounds are normal.  Musculoskeletal: L proximal tibia with ecchymosis and tenderness. L knee swollen, dec ROM. L distal leg tenderness as well. Entire calf swollen and painful. Diminished dorsalis pedis pulse. Able to wiggle toes   Neurological: He is alert.  Skin: Skin is warm.  Psychiatric: He has a normal mood and affect.   Data Review CBC w Diff:  Lab Results  Component Value Date   WBC 9.0 08/09/2016   HGB 12.9 (L) 08/09/2016   HGB 14.1 01/13/2014   HCT  38.7 (L) 08/09/2016   HCT 42.8 01/13/2014   PLT 201 08/09/2016   PLT 242 01/13/2014   LYMPHOPCT 10 08/09/2016   LYMPHOPCT 16.7 01/13/2014   MONOPCT 11 08/09/2016   MONOPCT 9.1 01/13/2014   EOSPCT 1 08/09/2016   EOSPCT 2.4 01/13/2014   BASOPCT 0 08/09/2016   BASOPCT 0.7 01/13/2014    CMP:  Lab Results  Component Value Date   NA 134 (L) 08/09/2016   NA 140 01/13/2014   K 3.6 08/09/2016   K 4.6 01/13/2014   CL 102 08/09/2016   CL 105 01/13/2013   CO2 25 08/09/2016   CO2 26 01/13/2014   BUN 17 08/09/2016   BUN 20.1 01/13/2014   CREATININE 0.72 08/09/2016   CREATININE 0.9 01/13/2014   PROT 6.7 01/13/2014   ALBUMIN 3.7 01/13/2014   BILITOT 0.39 01/13/2014   ALKPHOS 53 01/13/2014   AST 21 01/13/2014   ALT 25 01/13/2014    Coagulation:  Lab Results  Component Value Date   INR 0.99 08/09/2016    Cardiac markers:  Lab Results  Component Value Date   CKMB 2.3 03/26/2012   TROPONINI <0.30 03/26/2012   Assessment & Plan Principal Problem:   Tibial fracture Active Problems:   COPD GOLD II/ still smoking    AF (paroxysmal atrial fibrillation) ([facility])   CAD (coronary artery disease)   Hypertension   CAFL (chronic airflow limitation) ([facility])   Diabetes mellitus type 2, uncontrolled ([facility])   Acid reflux   Benign essential HTN   OP (osteoporosis)   Peripheral neuralgia   Chronic respiratory failure ([facility])   Morbid obesity (H[facility])   Tibia fracture  1. Left Tibial Metaphysis Fracture with distal fibula fracture - Pt is being managed by orthopedic team for this.  Hopefully can go home soon and return next week for surgery. Pt is moderate surgical risk given multiple medical problems but they all seem stable and controlled and optimized.   2. Essential Hypertension - stable blood pressure, continue current home blood pressure medications.  3. Paroxysmal Atrial fibrillation - This occurred in the setting of an acute pulmonary embolus or years ago and has been followed by  cardiology with no recurrence.  He is not anticoagulated. 4. CAD-stable on aspirin and statin. Recently seen by cardiology earlier this year and also had I have a stress myoview recently that was low risk.   5. COPD GOLD II - recently seen by pulmonologist last week, He has been stable on current regimen of respiratory medications.   6. Diabetes Mellitus, type 2 - ordered for carb modified diet, accu-check ordered and supplemental insulin. Check A1c.  DC home oral meds while in hospital.   7. GERD - has been stable per patient.  Protonix ordered for GI protection.    Thank you for the consult, we will follow the patient with you in the [place] [facility] [phone]

## 2016-08-09 NOTE — ED Notes (Signed)
ORTHO JON HOLDING ON SPLINT UNTIL CT AND XRAY PERFORMED

## 2016-08-09 NOTE — ED Notes (Signed)
DELAY IN TRANSFER UPSTAIRS. PT IS IN NEED FOR PAIN CONTROL BEFORE APPLICATION OF LEG SPLINT AND ORTHO AT BEDSIDE TO APPLY SPLINT BEFORE TRANSFER UPSTAIRS

## 2016-08-09 NOTE — ED Provider Notes (Signed)
[facility] DEPT Provider Note   CSN: [id number] Arrival date & time: 08/09/16  0919     History   Chief Complaint Chief Complaint  Patient presents with  . Leg Pain    HPI Paul [name] is a 75 y.o. male hx of CAD, COPD, afib not on blood thinners here with persistent L leg pain. He had a mechanical fall yesterday and twisted his left knee and landed on his left leg. States that the leg that behind him and he had a proximal tibial fracture as well as a distal fibula fracture on x-ray. Orthopedic doctor was called and wanted to have him follow-up outpatient for an elective surgery. He was also placed in a splint and was sent home with Vicodin. Overnight, he states that he's been taking the Vicodin but there is progressively worsening swelling of the left leg. Also he states that his pain is not controlled with the Vicodin. He is on oxygen as needed for COPD and denies any worsening shortness of breath compared to baseline. He called EMS due to severe pain and was given 100 g of fentanyl en route.    The history is provided by the patient.    Past Medical History:  Diagnosis Date  . Asthma   . Atrial fibrillation ([place])   . CAD (coronary artery disease)    Prior PTCA  . COPD (chronic obstructive pulmonary disease) ([place])   . Diabetes mellitus   . GERD (gastroesophageal reflux disease)   . Hyperlipidemia   . Hypertension   . lung ca dx'd 01/2006  . Nephrolithiasis   . Osteopenia   . Pulmonary embolus ([place])   . Tremor, essential     Patient Active Problem List   Diagnosis Date Noted  . Chronic respiratory failure with hypoxia ([place]) 09/11/2015  . Morbid obesity ([place]) 09/11/2015  . Chronic respiratory failure ([place]) 03/07/2015  . CAFL (chronic airflow limitation) ([place]) 10/27/2014  . Diabetes mellitus, type 2 ([place]) 10/27/2014  . Diabetes mellitus type 2, uncontrolled ([place]) 10/27/2014  . Acid reflux 10/27/2014  . Benign essential HTN 10/27/2014  . History of primary  bronchial cancer 10/27/2014  . Extreme obesity ([place]) 10/27/2014  . OP (osteoporosis) 10/27/2014  . Peripheral neuralgia 10/27/2014  . Hypercholesterolemia without hypertriglyceridemia 10/27/2014  . Compulsive tobacco user syndrome 10/27/2014  . Cough 06/21/2014  . Bronchitis 06/10/2014  . CAD (coronary artery disease) 04/08/2012  . Hyperlipidemia 04/08/2012  . Hypertension 04/08/2012  . Cigarette smoker 04/08/2012  . AF (paroxysmal atrial fibrillation) ([place]) 03/27/2012  . Acute pulmonary embolism ([place]) 03/25/2012  . Exercise hypoxemia 03/25/2012  . COPD GOLD II/ still smoking  03/25/2012  . DM (diabetes mellitus), type 2, uncontrolled with complications ([place]) 10/62/6948  . Malignant neoplasm of bronchus and lung, unspecified site 10/11/2011    Past Surgical History:  Procedure Laterality Date  . Angioplasty  Approx 1993  . CARDIAC CATHETERIZATION  Approximately 1993  . CHOLECYSTECTOMY    . 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 FIXATION KYPHOPLASTY    . LUNG CANCER SURGERY  01/2006   Small excision of left lung tissue; mesh with radioactive seeds (per pt report)  . ROTATOR CUFF TEAR  2016  . TONSILLECTOMY         Home Medications    Prior to Admission medications   Medication Sig Start Date End Date Taking? Authorizing Provider  acetaminophen (TYLENOL) 500 MG tablet Take 1,000 mg by mouth daily with breakfast.   Yes Historical Provider, MD  albuterol (  PROAIR HFA) 108 (90 BASE) MCG/ACT inhaler Inhale 2 puffs into the lungs every 2 (two) hours as needed for wheezing or shortness of breath.    Yes Historical Provider, MD  aspirin 81 MG tablet Take 81 mg by mouth daily with supper.   Yes Historical Provider, MD  budesonide-formoterol (SYMBICORT) 160-4.5 MCG/ACT inhaler Take 2 puffs first thing in am and then another 2 puffs about 12 hours later. Patient taking differently: Inhale 2 puffs into the lungs daily with breakfast.  01/26/16  Yes Tanda Rockers, MD  calcium carbonate  (OS-CAL) 600 MG TABS tablet Take 600 mg by mouth daily with supper.    Yes Historical Provider, MD  fexofenadine (ALLEGRA) 180 MG tablet Take 180 mg by mouth daily with breakfast.    Yes Historical Provider, MD  fluticasone (FLONASE) 50 MCG/ACT nasal spray Place 1 spray into the nose 2 (two) times daily.    Yes Historical Provider, MD  Fluticasone-Salmeterol (ADVAIR) 250-50 MCG/DOSE AEPB Inhale 1 puff into the lungs at bedtime.   Yes Historical Provider, MD  gabapentin (NEURONTIN) 300 MG capsule Take 6 capsules by mouth at bedtime Patient taking differently: Take 1,200 mg by mouth at bedtime.  10/25/15  Yes Dennie Bible, NP  glimepiride (AMARYL) 2 MG tablet Take 2 mg by mouth 2 (two) times daily.  04/28/16  Yes Historical Provider, MD  guaiFENesin (MUCINEX) 600 MG 12 hr tablet Take 1,200 mg by mouth daily with breakfast.    Yes Historical Provider, MD  hydrochlorothiazide (HYDRODIURIL) 12.5 MG tablet Take 12.5 mg by mouth daily with breakfast.    Yes Historical Provider, MD  HYDROcodone-acetaminophen (NORCO/VICODIN) 5-325 MG tablet Take 1 tablet by mouth every 4 (four) hours as needed. 08/08/16  Yes Daryl F de Villier II, PA  losartan (COZAAR) 50 MG tablet Take 1 tablet (50 mg total) by mouth daily. Patient taking differently: Take 50 mg by mouth daily with breakfast.  10/21/13  Yes Tanda Rockers, MD  metFORMIN (GLUCOPHAGE) 1000 MG tablet Take 1,000 mg by mouth 2 (two) times daily with a meal.     Yes Historical Provider, MD  metoCLOPramide (REGLAN) 10 MG tablet Take 10 mg by mouth 2 (two) times daily.   Yes Historical Provider, MD  Multiple Vitamins-Minerals (CENTRUM SILVER ADULT 50+) TABS Take 1 tablet by mouth daily with breakfast.   Yes Historical Provider, MD  OXYGEN Place 2 L into the nose at bedtime. 2L of oxygen at night and 2L of oxygen when walking long distances   Yes Historical Provider, MD  pioglitazone (ACTOS) 45 MG tablet Take 45 mg by mouth daily with breakfast.    Yes  Historical Provider, MD  pravastatin (PRAVACHOL) 40 MG tablet Take 1 tablet (40 mg total) by mouth daily. Patient taking differently: Take 40 mg by mouth at bedtime.  09/13/14  Yes Lelon Perla, MD  Vitamin D, Ergocalciferol, (DRISDOL) 50000 UNITS CAPS capsule Take 50,000 Units by mouth every Saturday.    Yes Historical Provider, MD    Family History Family History  Problem Relation Age of Onset  . Heart disease Mother   . Tremor Mother   . Dementia Mother   . Heart Problems Father     Social History Social History  Substance Use Topics  . Smoking status: Current Every Day Smoker    Packs/day: 0.50    Years: 59.00    Types: Cigarettes  . Smokeless tobacco: Never Used  . Alcohol use 0.6 oz/week    1  Glasses of wine per week     Comment: Rare     Allergies   Codeine and Phenobarbital   Review of Systems Review of Systems  Musculoskeletal:       L leg swelling   All other systems reviewed and are negative.    Physical Exam Updated Vital Signs BP 137/79 (BP Location: Left Arm)   Pulse 72   Temp 98.5 F (36.9 C)   Resp 17   Ht _0  (1.702 m)   Wt 248 lb (112.5 kg)   SpO2 93%   BMI 38.84 kg/m   Physical Exam  Constitutional:  Uncomfortable   HENT:  Head: Normocephalic.  Eyes: Pupils are equal, round, and reactive to light.  Neck: Normal range of motion.  Cardiovascular: Normal rate, regular rhythm and normal heart sounds.   Pulmonary/Chest: Effort normal.  Diminished, no obvious wheezing   Abdominal: Soft. Bowel sounds are normal.  Musculoskeletal:  L proximal tibia with ecchymosis and tenderness. L knee swollen, dec ROM. L distal leg tenderness as well. Entire calf swollen and painful. Diminished dorsalis pedis pulse. Able to wiggle toes   Neurological: He is alert.  Skin: Skin is warm.  Psychiatric: He has a normal mood and affect.  Nursing note and vitals reviewed.    ED Treatments / Results  Labs (all labs ordered are listed, but only  abnormal results are displayed) Labs Reviewed  CBC WITH DIFFERENTIAL/PLATELET - Abnormal; Notable for the following:       Result Value   Hemoglobin 12.9 (*)    HCT 38.7 (*)    All other components within normal limits  BASIC METABOLIC PANEL - Abnormal; Notable for the following:    Sodium 134 (*)    Glucose, Bld 222 (*)    All other components within normal limits  PROTIME-INR  CK    EKG  EKG Interpretation  Date/Time:  Thursday August 09 2016 11:46:04 EDT Ventricular Rate:  77 PR Interval:    QRS Duration: 147 QT Interval:  445 QTC Calculation: 504 R Axis:   -69 Text Interpretation:  Sinus rhythm RBBB and LAFB No significant change since last tracing Confirmed by Nasif Bos  MD, Ugochi Henzler ([id number]) on 08/09/2016 12:03:57 PM       Radiology Dg Tibia/fibula Left  Result Date: 08/08/2016 CLINICAL DATA:  Fall, pain from the knee to the ankle, deformity. EXAM: LEFT TIBIA AND FIBULA - 2 VIEW COMPARISON:  None. FINDINGS: There is a minimally displaced fracture of the proximal tibial metaphysis, incompletely imaged on the lateral view. A nondisplaced fracture of the distal fibular metaphysis is seen as well. IMPRESSION: 1. Minimally displaced proximal tibial metaphyseal fracture, suboptimally imaged. Dedicated views of the left knee are recommended in further initial evaluation. If a more aggressive approach is desired, CT without contrast is suggested. 2. Distal fibular metaphyseal fracture. Electronically Signed   By: Lorin Picket M.D.   On: 08/08/2016 16:43   Ct Knee Left Wo Contrast  Result Date: 08/09/2016 CLINICAL DATA:  Evaluate proximal tibial fracture. EXAM: CT OF THE left KNEE WITHOUT CONTRAST TECHNIQUE: Multidetector CT imaging of the left knee was performed according to the standard protocol. Multiplanar CT image reconstructions were also generated. COMPARISON:  Radiographs 08/08/2016. FINDINGS: Bones/Joint/Cartilage There is the largely transverse fracture through the metaphyseal  region of the tibia. I do not see definite involvement of the articular surface of the tibia. Anteriorly the fracture courses through the base of the tibial tuberosity and because of the pull of the  patellar tendon this segment of the fracture is displaced/the avulsed. Maximum displacement is 17 mm. Small joint effusion.  Mild degenerative changes. The femur is intact.  No patella fracture.  The fibula is intact. Ligaments Suboptimally assessed by CT. Muscles and Tendons The quadriceps and patellar tendons appear to be intact. No significant or obvious muscle injury. Soft tissues Significant subcutaneous soft tissue swelling/ edema/ hematoma involving the anterior aspect of the. IMPRESSION: 1. Largely transverse fracture through the metaphyseal region of the proximal tibia within a displaced/avulsed segment of the tibial tuberosity by the pull of the patellar tendon. 2. No evidence of involvement of the tibial plateau. 3. Small joint effusion. Electronically Signed   By: Marijo Sanes M.D.   On: 08/09/2016 13:13   Dg Knee Complete 4 Views Left  Result Date: 08/08/2016 CLINICAL DATA:  Slipped and fell in the garage today EXAM: LEFT KNEE - COMPLETE 4+ VIEW COMPARISON:  None. FINDINGS: Four views of the left knee submitted. There is mild displaced metaphyseal fracture in proximal left tibia. Degenerative changes are noted left knee joint. IMPRESSION: Mild displaced metaphysis fracture in proximal left tibia. Electronically Signed   By: Lahoma Crocker M.D.   On: 08/08/2016 16:41    Procedures Procedures (including critical care time)  Medications Ordered in ED Medications  ondansetron (ZOFRAN) injection 4 mg (4 mg Intravenous Not Given 08/09/16 1000)  HYDROmorphone (DILAUDID) injection 1 mg (1 mg Intravenous Given 08/09/16 1000)  ondansetron (ZOFRAN) injection 4 mg (4 mg Intravenous Given 08/09/16 1000)  sodium chloride 0.9 % bolus 1,000 mL (0 mLs Intravenous Stopped 08/09/16 1127)  HYDROmorphone (DILAUDID)  injection 1 mg (1 mg Intravenous Given 08/09/16 1136)     Initial Impression / Assessment and Plan / ED Course  I have reviewed the triage vital signs and the nursing notes.  Pertinent labs & imaging results that were available during my care of the patient were reviewed by me and considered in my medical decision making (see chart for details).  Clinical Course   ALLIE GERHOLD is a 75 y.o. male here with L tib/fib fracture after fall. Pain uncontrolled, swelling worse. Concerned for early compartment syndrome vs pain from worsening swelling. Consulted Dr. Onnie Graham, who is covering Dr. Maureen Ralphs, who will see patient.   1:19 PM Dr. Stann Mainland here from [facility] ortho. He plans to admit for pain control, physical therapy. Request medicine consult. Consulted Dr. Wynetta Emery to follow patient and possibly do a preop clearance. Recomemnd CT neck and ankle xrays and splint. He was not concerned for compartment syndrome    Final Clinical Impressions(s) / ED Diagnoses   Final diagnoses:  None    New Prescriptions New Prescriptions   No medications on file     Drenda Freeze, MD 08/09/16 1321

## 2016-08-09 NOTE — ED Notes (Signed)
NPO since 0715

## 2016-08-09 NOTE — ED Notes (Signed)
CT AND XRAY COMPLETE

## 2016-08-10 DIAGNOSIS — S82832A Other fracture of upper and lower end of left fibula, initial encounter for closed fracture: Secondary | ICD-10-CM | POA: Diagnosis not present

## 2016-08-10 DIAGNOSIS — E1165 Type 2 diabetes mellitus with hyperglycemia: Secondary | ICD-10-CM | POA: Diagnosis not present

## 2016-08-10 DIAGNOSIS — S82222A Displaced transverse fracture of shaft of left tibia, initial encounter for closed fracture: Secondary | ICD-10-CM | POA: Diagnosis not present

## 2016-08-10 DIAGNOSIS — S82102A Unspecified fracture of upper end of left tibia, initial encounter for closed fracture: Secondary | ICD-10-CM | POA: Diagnosis not present

## 2016-08-10 DIAGNOSIS — J9611 Chronic respiratory failure with hypoxia: Secondary | ICD-10-CM | POA: Diagnosis not present

## 2016-08-10 DIAGNOSIS — S82892A Other fracture of left lower leg, initial encounter for closed fracture: Secondary | ICD-10-CM | POA: Diagnosis not present

## 2016-08-10 DIAGNOSIS — J449 Chronic obstructive pulmonary disease, unspecified: Secondary | ICD-10-CM | POA: Diagnosis not present

## 2016-08-10 DIAGNOSIS — M79605 Pain in left leg: Secondary | ICD-10-CM | POA: Diagnosis not present

## 2016-08-10 DIAGNOSIS — I10 Essential (primary) hypertension: Secondary | ICD-10-CM | POA: Diagnosis not present

## 2016-08-10 DIAGNOSIS — S82202A Unspecified fracture of shaft of left tibia, initial encounter for closed fracture: Secondary | ICD-10-CM | POA: Diagnosis not present

## 2016-08-10 DIAGNOSIS — I480 Paroxysmal atrial fibrillation: Secondary | ICD-10-CM | POA: Diagnosis not present

## 2016-08-10 LAB — GLUCOSE, CAPILLARY
Glucose-Capillary: 147 mg/dL — ABNORMAL HIGH (ref 65–99)
Glucose-Capillary: 174 mg/dL — ABNORMAL HIGH (ref 65–99)
Glucose-Capillary: 177 mg/dL — ABNORMAL HIGH (ref 65–99)
Glucose-Capillary: 188 mg/dL — ABNORMAL HIGH (ref 65–99)

## 2016-08-10 LAB — COMPREHENSIVE METABOLIC PANEL
ALT: 19 U/L (ref 17–63)
AST: 20 U/L (ref 15–41)
Albumin: 3.5 g/dL (ref 3.5–5.0)
Alkaline Phosphatase: 32 U/L — ABNORMAL LOW (ref 38–126)
Anion gap: 7 (ref 5–15)
BUN: 13 mg/dL (ref 6–20)
CO2: 26 mmol/L (ref 22–32)
Calcium: 8.6 mg/dL — ABNORMAL LOW (ref 8.9–10.3)
Chloride: 106 mmol/L (ref 101–111)
Creatinine, Ser: 0.58 mg/dL — ABNORMAL LOW (ref 0.61–1.24)
GFR calc Af Amer: >60 mL/min (ref >60)
GFR calc non Af Amer: >60 mL/min (ref >60)
Glucose, Bld: 129 mg/dL — ABNORMAL HIGH (ref 65–99)
Potassium: 3.5 mmol/L (ref 3.5–5.1)
Sodium: 139 mmol/L (ref 135–145)
Total Bilirubin: 0.8 mg/dL (ref 0.3–1.2)
Total Protein: 6.1 g/dL — ABNORMAL LOW (ref 6.5–8.1)

## 2016-08-10 LAB — CBC WITH DIFFERENTIAL/PLATELET
Basophils Absolute: 0 10*3/uL (ref 0.0–0.1)
Basophils Relative: 0 %
Eosinophils Absolute: 0.3 10*3/uL (ref 0.0–0.7)
Eosinophils Relative: 3 %
HCT: 35.1 % — ABNORMAL LOW (ref 39.0–52.0)
Hemoglobin: 11.5 g/dL — ABNORMAL LOW (ref 13.0–17.0)
Lymphocytes Relative: 18 %
Lymphs Abs: 1.4 10*3/uL (ref 0.7–4.0)
MCH: 27.4 pg (ref 26.0–34.0)
MCHC: 32.8 g/dL (ref 30.0–36.0)
MCV: 83.8 fL (ref 78.0–100.0)
Monocytes Absolute: 1.1 10*3/uL — ABNORMAL HIGH (ref 0.1–1.0)
Monocytes Relative: 13 %
Neutro Abs: 5.1 10*3/uL (ref 1.7–7.7)
Neutrophils Relative %: 66 %
Platelets: 199 10*3/uL (ref 150–400)
RBC: 4.19 MIL/uL — ABNORMAL LOW (ref 4.22–5.81)
RDW: 15.3 % (ref 11.5–15.5)
WBC: 7.9 10*3/uL (ref 4.0–10.5)

## 2016-08-10 LAB — HEMOGLOBIN A1C
Hgb A1c MFr Bld: 6.5 % — ABNORMAL HIGH (ref 4.8–5.6)
Mean Plasma Glucose: 140 mg/dL

## 2016-08-10 NOTE — Evaluation (Addendum)
Physical Therapy Evaluation Patient Details Name: Paul [name] MRN: [id number] DOB: [birth year] Today's Date: 08/10/2016   History of Present Illness  75 y.o. male with hx of CAD, COPD, paroxysmal afib not on blood thinners and presented to ED with persistent L leg pain. He had a mechanical fall yesterday and sustained Left Tibial Metaphysis Fracture with distal fibula fracture   Clinical Impression  Pt admitted with above diagnosis. Pt currently with functional limitations due to the deficits listed below (see PT Problem List).  Pt will benefit from skilled PT to increase their independence and safety with mobility to allow discharge to the venue listed below.  Pt presents with poor upper body strength and difficulty performing transfers without increased assist.  Pt's initial plan was home, so educated on stand-pivot and lateral scoot transfer however pt unable to perform without increased assist.  Pt and spouse agreeable to SNF to improve pt's transfers and independence in preparation for anticipated L LE surgery.     Follow Up Recommendations SNF;Supervision/Assistance - 24 hour    Equipment Recommendations  Wheelchair cushion (measurements PT);Wheelchair (measurements PT);Hospital bed    Recommendations for Other Services       Precautions / Restrictions Precautions Precautions: Fall Restrictions Weight Bearing Restrictions: Yes LLE Weight Bearing: Non weight bearing      Mobility  Bed Mobility Overal bed mobility: Needs Assistance Bed Mobility: Sit to Supine       Sit to supine: Mod assist   General bed mobility comments: verbal cues for technique. assist for LEs onto bed  Transfers Overall transfer level: Needs assistance Equipment used: Rolling walker (2 wheeled) Transfers: Sit to/from Stand;Lateral/Scoot Transfers;Stand Pivot Transfers Sit to Stand: Max assist;+2 physical assistance Stand pivot transfers: Mod assist;+2 physical assistance      Lateral/Scoot  Transfers: Max assist;+2 physical assistance General transfer comment: Pt with poor upper body strength and requiring increased assist for all mobility.  attempted above transfers however pt requiring increased assist for all.  recliner to elevated bed attempted to see if pt would be able to perform at home - unable without increased assist, improved with transfer to lower surface  Ambulation/Gait                Stairs            Wheelchair Mobility    Modified Rankin (Stroke Patients Only)       Balance Overall balance assessment: Needs assistance         Standing balance support: Bilateral upper extremity supported;During functional activity Standing balance-Leahy Scale: Poor Standing balance comment: requires external support                             Pertinent Vitals/Pain Pain Assessment: 0-10 Pain Score: 10-Worst pain ever Pain Location: L LE with mobility, dependent position Pain Descriptors / Indicators: Discomfort;Throbbing Pain Intervention(s): Limited activity within patient's tolerance;Monitored during session;Repositioned    Home Living Family/patient expects to be discharged to:: Private residence Living Arrangements: Spouse/significant other;Children Available Help at Discharge: Family Type of Home: House Home Access: Stairs to enter   Technical brewer of Steps: 1 and 1 Home Layout: Able to live on main level with bedroom/bathroom Home Equipment: Walker - 2 wheels;Bedside commode Additional Comments: wears O2 at night    Prior Function Level of Independence: Independent               Hand Dominance  Extremity/Trunk Assessment   Upper Extremity Assessment: Generalized weakness           Lower Extremity Assessment: Generalized weakness;LLE deficits/detail   LLE Deficits / Details: L LE not tested, lower leg splinted, NWB     Communication   Communication: No difficulties  Cognition  Arousal/Alertness: Awake/alert Behavior During Therapy: WFL for tasks assessed/performed Overall Cognitive Status: Within Functional Limits for tasks assessed                      General Comments      Exercises     Assessment/Plan    PT Assessment Patient needs continued PT services  PT Problem List Decreased strength;Decreased activity tolerance;Decreased mobility;Decreased knowledge of use of DME;Decreased balance;Pain;Cardiopulmonary status limiting activity          PT Treatment Interventions DME instruction;Functional mobility training;Wheelchair mobility training;Patient/family education;Therapeutic activities;Therapeutic exercise;Balance training    PT Goals (Current goals can be found in the Care Plan section)  Acute Rehab PT Goals PT Goal Formulation: With patient Time For Goal Achievement: 08/17/16 Potential to Achieve Goals: Good    Frequency Min 3X/week   Barriers to discharge        Co-evaluation               End of Session Equipment Utilized During Treatment: Gait belt Activity Tolerance: Patient limited by fatigue;Patient limited by pain Patient left: in bed;with call bell/phone within reach;with bed alarm set      Functional Assessment Tool Used: Clinical judgement Functional Limitation: Mobility: Walking and moving around Mobility: Walking and Moving Around Current Status (U6333): At least 80 percent but less than 100 percent impaired, limited or restricted Mobility: Walking and Moving Around Goal Status [phone]): At least 40 percent but less than 60 percent impaired, limited or restricted    Time: 5638-9373 PT Time Calculation (min) (ACUTE ONLY): 48 min   Charges:   PT Evaluation $PT Eval Moderate Complexity: 1 Procedure PT Treatments $Therapeutic Activity: 23-37 mins   PT G Codes:   PT G-Codes **NOT FOR INPATIENT CLASS** Functional Assessment Tool Used: Clinical judgement Functional Limitation: Mobility: Walking and moving  around Mobility: Walking and Moving Around Current Status (S2876): At least 80 percent but less than 100 percent impaired, limited or restricted Mobility: Walking and Moving Around Goal Status [phone]): At least 40 percent but less than 60 percent impaired, limited or restricted    Orlena Garmon,KATHrine E 08/10/2016, 1:40 PM Carmelia Bake, PT, DPT 08/10/2016 Pager: [phone]

## 2016-08-10 NOTE — NC FL2 (Deleted)
[place] LEVEL OF CARE SCREENING TOOL     IDENTIFICATION  Patient Name: Paul [name] Birthdate: 24-Jun-1941 Sex: male Admission Date (Current Location): 08/09/2016  [facility] and [place] Number:  Herbalist and Address:  [facility],  [place], [place]      Provider Number: [id number]  Attending Physician Name and Address:  Nicholes Stairs, MD  Relative Name and Phone Number:       Current Level of Care: Hospital Recommended Level of Care: [place] Prior Approval Number:    Date Approved/Denied:   PASRR Number: [id number]  Discharge Plan: SNF    Current Diagnoses: Patient Active Problem List   Diagnosis Date Noted  . Tibial fracture 08/09/2016  . Tibia fracture 08/09/2016  . COPD (chronic obstructive pulmonary disease) ([place])   . Chronic respiratory failure with hypoxia ([place]) 09/11/2015  . Morbid obesity ([place]) 09/11/2015  . Chronic respiratory failure ([place]) 03/07/2015  . CAFL (chronic airflow limitation) ([place]) 10/27/2014  . Diabetes mellitus, type 2 ([place]) 10/27/2014  . Diabetes mellitus type 2, uncontrolled ([place]) 10/27/2014  . Acid reflux 10/27/2014  . Benign essential HTN 10/27/2014  . History of primary bronchial cancer 10/27/2014  . Extreme obesity ([place]) 10/27/2014  . OP (osteoporosis) 10/27/2014  . Peripheral neuralgia 10/27/2014  . Hypercholesterolemia without hypertriglyceridemia 10/27/2014  . Compulsive tobacco user syndrome 10/27/2014  . Cough 06/21/2014  . Bronchitis 06/10/2014  . CAD (coronary artery disease) 04/08/2012  . Hyperlipidemia 04/08/2012  . Hypertension 04/08/2012  . Cigarette smoker 04/08/2012  . AF (paroxysmal atrial fibrillation) ([place]) 03/27/2012  . Exercise hypoxemia 03/25/2012  . COPD GOLD II/ still smoking  03/25/2012  . DM (diabetes mellitus), type 2, uncontrolled with complications ([place]) 47/82/9562  . Malignant neoplasm of bronchus and lung, unspecified  site 10/11/2011    Orientation RESPIRATION BLADDER Height & Weight     Self, Time, Situation, Place  Normal Continent Weight: 248 lb (112.5 kg) Height:  _0  (170.2 cm)  BEHAVIORAL SYMPTOMS/MOOD NEUROLOGICAL BOWEL NUTRITION STATUS  Other (Comment) (no behaviors)   Continent Diet  AMBULATORY STATUS COMMUNICATION OF NEEDS Skin   Extensive Assist Verbally Normal                       Personal Care Assistance Level of Assistance  Bathing, Feeding, Dressing Bathing Assistance: Limited assistance Feeding assistance: Independent Dressing Assistance: Limited assistance     Functional Limitations Info  Sight, Hearing, Speech Sight Info: Adequate Hearing Info: Adequate Speech Info: Adequate    SPECIAL CARE FACTORS FREQUENCY  PT (By licensed PT)     PT Frequency: 5 x wk              Contractures Contractures Info: Not present    Additional Factors Info  Code Status, Allergies, Insulin Sliding Scale Code Status Info: full code Allergies Info: CODEINE           Current Medications (08/10/2016):  This is the current hospital active medication list Current Facility-Administered Medications  Medication Dose Route Frequency Provider Last Rate Last Dose  . acetaminophen (TYLENOL) tablet 650 mg  650 mg Oral Q6H PRN Nicholes Stairs, MD       Or  . acetaminophen (TYLENOL) suppository 650 mg  650 mg Rectal Q6H PRN Nicholes Stairs, MD      . albuterol (PROVENTIL) (2.5 MG/3ML) 0.083% nebulizer solution 2.5 mg  2.5 mg Nebulization Q2H PRN Nicholes Stairs, MD      .  aspirin EC tablet 81 mg  81 mg Oral Q supper Clan[place] Marisa Hua, MD   81 mg at 08/09/16 1954  . docusate sodium (COLACE) capsule 100 mg  100 mg Oral BID Nicholes Stairs, MD   100 mg at 08/10/16 0930  . enoxaparin (LOVENOX) injection 40 mg  40 mg Subcutaneous Q24H Nicholes Stairs, MD   40 mg at 08/09/16 1954  . fluticasone (FLONASE) 50 MCG/ACT nasal spray 1 spray  1 spray Each Nare BID  Clanford Marisa Hua, MD   1 spray at 08/10/16 0930  . gabapentin (NEURONTIN) capsule 1,200 mg  1,200 mg Oral QHS Clanford Marisa Hua, MD   1,200 mg at 08/09/16 2216  . insulin aspart (novoLOG) injection 0-15 Units  0-15 Units Subcutaneous TID WC Clanford Marisa Hua, MD   3 Units at 08/10/16 1300  . losartan (COZAAR) tablet 50 mg  50 mg Oral Q breakfast Clanford Marisa Hua, MD   50 mg at 08/10/16 0816  . methocarbamol (ROBAXIN) tablet 500 mg  500 mg Oral Q6H PRN Nicholes Stairs, MD       Or  . methocarbamol (ROBAXIN) 500 mg in dextrose 5 % 50 mL IVPB  500 mg Intravenous Q6H PRN Nicholes Stairs, MD      . metoCLOPramide (REGLAN) tablet 10 mg  10 mg Oral BID Clan[place] Marisa Hua, MD   10 mg at 08/10/16 0931  . mometasone-formoterol (DULERA) 200-5 MCG/ACT inhaler 2 puff  2 puff Inhalation BID Clanford Marisa Hua, MD   2 puff at 08/10/16 0817  . morphine 2 MG/ML injection 2 mg  2 mg Intravenous Q2H PRN Nicholes Stairs, MD      . ondansetron [facility]) injection 4 mg  4 mg Intravenous Once Drenda Freeze, MD      . oxyCODONE (Oxy IR/ROXICODONE) immediate release tablet 5-10 mg  5-10 mg Oral Q3H PRN Nicholes Stairs, MD   10 mg at 08/10/16 1300  . pantoprazole (PROTONIX) EC tablet 40 mg  40 mg Oral Daily Clanford Marisa Hua, MD   40 mg at 08/10/16 0930  . pravastatin (PRAVACHOL) tablet 40 mg  40 mg Oral QHS Clanford Marisa Hua, MD   40 mg at 08/09/16 2216     Discharge Medications: Please see discharge summary for a list of discharge medications.  Relevant Imaging Results:  Relevant Lab Results:   Additional Information SS # [id number]  Farhad Burleson, [place] An, LCSW

## 2016-08-10 NOTE — Progress Notes (Signed)
Medicine Consult PROGRESS NOTE    Paul [name]  XBM:[id number] DOB: 1940/12/21 DOA: 08/09/2016 PCP: Marjorie Smolder, MD  Brief Narrative:   Assessment & Plan: 75 year old male with history of coronary artery disease, COPD, paroxysmal atrial fibrillation not on blood thinners presented with left lower extremity fracture pain. Patient reported tripping and had a fall causing left lower extremity fracture. Admitted under orthopedic service. We were consulted for preop evaluation.  #Left tibial metaphysis fracture with distal fibular fracture: Management as per primary service/orthopedic service. Preoperative evaluation was already done yesterday. Patient is at moderate surgical risk given multiple medical conditions and orthopedic procedure. Further management including pain rehabilitation and possible surgical intervention as per orthopedic team.  #Essential hypertension: Elevated blood pressure likely contributed by pain. Patient is currently on losartan. Monitor blood pressure. Pain management. Continue to hold hydrochlorothiazide.  #History of coronary artery disease, paroxysmal atrial fibrillation: Patient is a stable this time. Recently evaluated by cardiologist. Currently on aspirin, statin. Holding antiplatelets or any anticoagulation before the surgery, I will defer this to the surgery team.   #History of COPD: Stable this time continue bronchodilators and respiratory therapy.  #Type 2 diabetes: Monitor blood sugar level. Continue sliding scale. Resume home medication on discharge.  #Acid reflux: Continue Protonix.   DVT prophylaxis: Lovenox Code Status: Full code Family Communication: Discussed with the wife at bedside, on patient's permission Disposition Plan: Likely discharge to rehabilitation, defer to primary team.  Subjective: Patient was seen and examined at bedside. He reported left leg pain and just cut the pain medicine. Denied headache, dizziness, nausea, vomiting,  chest pain, shortness of breath.   Objective: Vitals:   08/09/16 2056 08/09/16 2057 08/10/16 0430 08/10/16 1349  BP:  (!) 144/60 (!) 149/56 (!) 132/51  Pulse:  82 84 88  Resp:  (!) 25 (!) 21 (!) 22  Temp:  98.4 F (36.9 C) 98.4 F (36.9 C) 98.4 F (36.9 C)  TempSrc:  Oral Oral Oral  SpO2: 94% 94% 93% 93%  Weight:      Height:        Intake/Output Summary (Last 24 hours) at 08/10/16 1521 Last data filed at 08/10/16 1400  Gross per 24 hour  Intake              960 ml  Output             1275 ml  Net             -315 ml   Filed Weights   08/09/16 0943  Weight: 112.5 kg (248 lb)    Examination:  General exam: Appears calm and comfortable  Respiratory system: Clear to auscultation. Respiratory effort normal. Cardiovascular system: S1 & S2 heard, RRR. No JVD, murmurs, rubs, gallops or clicks. No pedal edema. Gastrointestinal system: Abdomen is nondistended, soft and nontender. Normal bowel sounds heard. Central nervous system: Alert, awake and oriented.  Extremities: Left leg has bandages around. Skin: No rashes, lesions or ulcers Psychiatry: Judgement and insight appear normal. Mood & affect appropriate.     Data Reviewed: I have personally reviewed following labs and imaging studies  CBC:  Recent Labs Lab 08/09/16 1020 08/10/16 0443  WBC 9.0 7.9  NEUTROABS 7.1 5.1  HGB 12.9* 11.5*  HCT 38.7* 35.1*  MCV 83.0 83.8  PLT 201 027   Basic Metabolic Panel:  Recent Labs Lab 08/09/16 1020 08/10/16 0443  NA 134* 139  K 3.6 3.5  CL 102 106  CO2 25 26  GLUCOSE 222*  129*  BUN 17 13  CREATININE 0.72 0.58*  CALCIUM 8.9 8.6*   GFR: Estimated Creatinine Clearance: 94.1 mL/min (by C-G formula based on SCr of 0.58 mg/dL (L)). Liver Function Tests:  Recent Labs Lab 08/10/16 0443  AST 20  ALT 19  ALKPHOS 32*  BILITOT 0.8  PROT 6.1*  ALBUMIN 3.5   No results for input(s): LIPASE, AMYLASE in the last 168 hours. No results for input(s): AMMONIA in the last  168 hours. Coagulation Profile:  Recent Labs Lab 08/09/16 1020  INR 0.99   Cardiac Enzymes:  Recent Labs Lab 08/09/16 1020  CKTOTAL 130   BNP (last 3 results) No results for input(s): PROBNP in the last 8760 hours. HbA1C:  Recent Labs  08/09/16 1020  HGBA1C 6.5*   CBG:  Recent Labs Lab 08/09/16 1722 08/09/16 2330 08/10/16 0800 08/10/16 1157  GLUCAP 172* 158* 147* 174*   Lipid Profile: No results for input(s): CHOL, HDL, LDLCALC, TRIG, CHOLHDL, LDLDIRECT in the last 72 hours. Thyroid Function Tests: No results for input(s): TSH, T4TOTAL, FREET4, T3FREE, THYROIDAB in the last 72 hours. Anemia Panel: No results for input(s): VITAMINB12, FOLATE, FERRITIN, TIBC, IRON, RETICCTPCT in the last 72 hours. Sepsis Labs: No results for input(s): PROCALCITON, LATICACIDVEN in the last 168 hours.  No results found for this or any previous visit (from the past 240 hour(s)).       Radiology Studies: Dg Tibia/fibula Left  Result Date: 08/08/2016 CLINICAL DATA:  Fall, pain from the knee to the ankle, deformity. EXAM: LEFT TIBIA AND FIBULA - 2 VIEW COMPARISON:  None. FINDINGS: There is a minimally displaced fracture of the proximal tibial metaphysis, incompletely imaged on the lateral view. A nondisplaced fracture of the distal fibular metaphysis is seen as well. IMPRESSION: 1. Minimally displaced proximal tibial metaphyseal fracture, suboptimally imaged. Dedicated views of the left knee are recommended in further initial evaluation. If a more aggressive approach is desired, CT without contrast is suggested. 2. Distal fibular metaphyseal fracture. Electronically Signed   By: Lorin Picket M.D.   On: 08/08/2016 16:43   Dg Ankle Complete Left  Result Date: 08/09/2016 CLINICAL DATA:  [place] yesterday with pain EXAM: LEFT ANKLE COMPLETE - 3+ VIEW COMPARISON:  Left tibia films of 08/08/2016 FINDINGS: There is a nondisplaced fracture of the distal left fibula. Also there is a small  avulsion fracture fragment from the tip of the medial malleolus. The ankle joint is unremarkable. Alignment is normal. IMPRESSION: 1. Oblique nondisplaced fracture through the distal left fibula. 2. Small avulsion fracture fragment from the tip of the medial malleolus. Electronically Signed   By: Ivar Drape M.D.   On: 08/09/2016 13:21   Ct Knee Left Wo Contrast  Result Date: 08/09/2016 CLINICAL DATA:  Evaluate proximal tibial fracture. EXAM: CT OF THE left KNEE WITHOUT CONTRAST TECHNIQUE: Multidetector CT imaging of the left knee was performed according to the standard protocol. Multiplanar CT image reconstructions were also generated. COMPARISON:  Radiographs 08/08/2016. FINDINGS: Bones/Joint/Cartilage There is the largely transverse fracture through the metaphyseal region of the tibia. I do not see definite involvement of the articular surface of the tibia. Anteriorly the fracture courses through the base of the tibial tuberosity and because of the pull of the patellar tendon this segment of the fracture is displaced/the avulsed. Maximum displacement is 17 mm. Small joint effusion.  Mild degenerative changes. The femur is intact.  No patella fracture.  The fibula is intact. Ligaments Suboptimally assessed by CT. Muscles and Tendons The quadriceps  and patellar tendons appear to be intact. No significant or obvious muscle injury. Soft tissues Significant subcutaneous soft tissue swelling/ edema/ hematoma involving the anterior aspect of the. IMPRESSION: 1. Largely transverse fracture through the metaphyseal region of the proximal tibia within a displaced/avulsed segment of the tibial tuberosity by the pull of the patellar tendon. 2. No evidence of involvement of the tibial plateau. 3. Small joint effusion. Electronically Signed   By: Marijo Sanes M.D.   On: 08/09/2016 13:13   Dg Knee Complete 4 Views Left  Result Date: 08/08/2016 CLINICAL DATA:  Slipped and fell in the garage today EXAM: LEFT KNEE -  COMPLETE 4+ VIEW COMPARISON:  None. FINDINGS: Four views of the left knee submitted. There is mild displaced metaphyseal fracture in proximal left tibia. Degenerative changes are noted left knee joint. IMPRESSION: Mild displaced metaphysis fracture in proximal left tibia. Electronically Signed   By: Lahoma Crocker M.D.   On: 08/08/2016 16:41        Scheduled Meds: . aspirin EC  81 mg Oral Q supper  . docusate sodium  100 mg Oral BID  . enoxaparin (LOVENOX) injection  40 mg Subcutaneous Q24H  . fluticasone  1 spray Each Nare BID  . gabapentin  1,200 mg Oral QHS  . insulin aspart  0-15 Units Subcutaneous TID WC  . losartan  50 mg Oral Q breakfast  . metoCLOPramide  10 mg Oral BID  . mometasone-formoterol  2 puff Inhalation BID  . ondansetron (ZOFRAN) IV  4 mg Intravenous Once  . pantoprazole  40 mg Oral Daily  . pravastatin  40 mg Oral QHS   Continuous Infusions:    LOS: 1 day    Time spent: 24 minutes    Michiah Masse Tanna Furry, MD [facility] Pager [phone]  If 7PM-7AM, please contact night-coverage [email or web address] Password [email or web address] 08/10/2016, 3:21 PM

## 2016-08-10 NOTE — Clinical Social Work Note (Signed)
Clinical Social Work Assessment  Patient Details  Name: Paul [name] MRN: [id number] Date of Birth: 1941-01-29  Date of referral:  08/10/16               Reason for consult:  Facility Placement, Discharge Planning                Permission sought to share information with:  Chartered certified accountant granted to share information::  Yes, Verbal Permission Granted  Name::        Agency::     Relationship::     Contact Information:     Housing/Transportation Living arrangements for the past 2 months:  Single Family Home Source of Information:  Patient, Spouse Patient Interpreter Needed:  None Criminal Activity/Legal Involvement Pertinent to Current Situation/Hospitalization:  No - Comment as needed Significant Relationships:  Spouse Lives with:  Spouse Do you feel safe going back to the place where you live?  No Need for family participation in patient care:  Yes (Comment)  Care giving concerns:  Pt requires more care than is available at home following hospital d/c.   Social Worker assessment / plan:  Pt hospitalized on 08/09/16 under observation status due to tibial fx. Surgery is pending ( 1-2 wks ). PT has recommended ST Rehab at d/c. Pt / spouse are in agreement with this plan and understand that medicare will not cover cost of placement. SNF search initiated and bed offers provided. Pt / spouse have chosen [facility]. CSW will continue to follow to assist with d/c planning to SNF.  Employment status:  Retired Forensic scientist:  Medicare PT Recommendations:  [place] / Referral to community resources:  [place]  Patient/Family's Response to care:  Pt / spouse feel ST Rehab is needed while waiting for surgery.  Patient/Family's Understanding of and Emotional Response to Diagnosis, Current Treatment, and Prognosis:  Pt / spouse are aware of pt's medical status. They understand surgery is pending ( 1-2 wks).  Pt / spouse are exhausted. Support / reassurance provided.  Emotional Assessment Appearance:  Appears stated age Attitude/Demeanor/Rapport:  Other (cooperative) Affect (typically observed):  Calm, Appropriate, Pleasant, Accepting Orientation:  Oriented to Self, Oriented to Place, Oriented to  Time, Oriented to Situation Alcohol / Substance use:  Not Applicable Psych involvement (Current and /or in the community):  No (Comment)  Discharge Needs  Concerns to be addressed:  Discharge Planning Concerns Readmission within the last 30 days:  No Current discharge risk:  None Barriers to Discharge:  No Barriers Identified   Luretha Rued, [place] 08/10/2016, 2:10 PM

## 2016-08-10 NOTE — Progress Notes (Signed)
   Subjective:  Patient reports pain as moderate.  Better at rest  Objective:   VITALS:   Vitals:   08/10/16 0430 08/10/16 1349 08/10/16 1938 08/10/16 2044  BP: (!) 149/56 (!) 132/51  (!) 147/54  Pulse: 84 88  70  Resp: (!) 21 (!) 22  (!) 24  Temp: 98.4 F (36.9 C) 98.4 F (36.9 C)  99 F (37.2 C)  TempSrc: Oral Oral  Oral  SpO2: 93% 93% 90% 95%  Weight:      Height:        Neurologically intact [place] c/d/i, no pain with passsive stretch, cap refill <2s, no compartment syndrome  Lab Results  Component Value Date   WBC 7.9 08/10/2016   HGB 11.5 (L) 08/10/2016   HCT 35.1 (L) 08/10/2016   MCV 83.8 08/10/2016   PLT 199 08/10/2016   BMET    Component Value Date/Time   NA 139 08/10/2016 0443   NA 140 01/13/2014 0922   K 3.5 08/10/2016 0443   K 4.6 01/13/2014 0922   CL 106 08/10/2016 0443   CL 105 01/13/2013 0856   CO2 26 08/10/2016 0443   CO2 26 01/13/2014 0922   GLUCOSE 129 (H) 08/10/2016 0443   GLUCOSE 112 01/13/2014 0922   GLUCOSE 174 (H) 01/13/2013 0856   BUN 13 08/10/2016 0443   BUN 20.1 01/13/2014 0922   CREATININE 0.58 (L) 08/10/2016 0443   CREATININE 0.9 01/13/2014 0922   CALCIUM 8.6 (L) 08/10/2016 0443   CALCIUM 9.9 01/13/2014 0922   GFRNONAA >60 08/10/2016 0443   GFRAA >60 08/10/2016 0443     Assessment/Plan:     Principal Problem:   Tibial fracture Active Problems:   COPD GOLD II/ still smoking    AF (paroxysmal atrial fibrillation) (HCC)   CAD (coronary artery disease)   Hypertension   CAFL (chronic airflow limitation) (HCC)   Diabetes mellitus type 2, uncontrolled (HCC)   Acid reflux   Benign essential HTN   OP (osteoporosis)   Peripheral neuralgia   Chronic respiratory failure (HCC)   Morbid obesity ([place])   Tibia fracture   Up with therapy Discharge to SNF -plan for staged fixation given wound risks with DM and ankle swelling -anticipate surgery next week, Thursday or Friday if skin ok -NWB LLE -elevate -lovenox for dvt  ppx and scd on RLE -appreciate medicine help with management -appreciate SW help with placement   Nicholes Stairs 08/10/2016, 10:41 PM   Geralynn Rile, MD [phone]

## 2016-08-10 NOTE — Care Management Obs Status (Signed)
[place] NOTIFICATION   Patient Details  Name: Paul [name] MRN: [id number] Date of Birth: 1941/08/27   Medicare Observation Status Notification Given:  Yes    MahabirJuliann Pulse, RN 08/10/2016, 2:35 PM

## 2016-08-10 NOTE — Care Management Note (Signed)
Case Management Note  Patient Details  Name: JP EASTHAM MRN: [id number] Date of Birth: 1941-08-21  Subjective/Objective:  75 y/o m admitted w/tib fx. PT-SNF. CSE following for SNF.                Action/Plan:d/c SNF   Expected Discharge Date:   (unknown)               Expected Discharge Plan:  [place]  In-House Referral:     Discharge planning Services     Post Acute Care Choice:    Choice offered to:     DME Arranged:    DME Agency:     HH Arranged:    [place] Agency:     Status of Service:  Completed, signed off  If discussed at [employer] of Stay Meetings, dates discussed:    Additional Comments:  Dessa Phi, RN 08/10/2016, 2:34 PM

## 2016-08-10 NOTE — Clinical Social Work Placement (Signed)
   CLINICAL SOCIAL WORK PLACEMENT  NOTE  Date:  08/10/2016  Patient Details  Name: Paul [name] MRN: [id number] Date of Birth: [birth year]  Clinical Social Work is seeking post-discharge placement for this patient at the [place] level of care (*CSW will initial, date and re-position this form in  chart as items are completed):  Yes   Patient/family provided with [place] Work Department's list of facilities offering this level of care within the geographic area requested by the patient (or if unable, by the patient's family).  Yes   Patient/family informed of their freedom to choose among providers that offer the needed level of care, that participate in Medicare, Medicaid or managed care program needed by the patient, have an available bed and are willing to accept the patient.  Yes   Patient/family informed of [facility]'s ownership interest in [facility] and [facility], as well as of the fact that they are under no obligation to receive care at these facilities.  PASRR submitted to EDS on 08/10/16     PASRR number received on 08/10/16     Existing PASRR number confirmed on       FL2 transmitted to all facilities in geographic area requested by pt/family on 08/10/16     FL2 transmitted to all facilities within larger geographic area on       Patient informed that his/her managed care company has contracts with or will negotiate with certain facilities, including the following:        Yes   Patient/family informed of bed offers received.  Patient chooses bed at [facility]     Physician recommends and patient chooses bed at      Patient to be transferred to [facility] on  .  Patient to be transferred to facility by       Patient family notified on   of transfer.  Name of family member notified:        PHYSICIAN       Additional Comment:     _______________________________________________ Luretha Rued, [place] 08/10/2016, 2:29 PM

## 2016-08-11 DIAGNOSIS — J449 Chronic obstructive pulmonary disease, unspecified: Secondary | ICD-10-CM | POA: Diagnosis not present

## 2016-08-11 DIAGNOSIS — S82222A Displaced transverse fracture of shaft of left tibia, initial encounter for closed fracture: Secondary | ICD-10-CM | POA: Diagnosis not present

## 2016-08-11 DIAGNOSIS — I10 Essential (primary) hypertension: Secondary | ICD-10-CM | POA: Diagnosis not present

## 2016-08-11 DIAGNOSIS — S82112A Displaced fracture of left tibial spine, initial encounter for closed fracture: Secondary | ICD-10-CM | POA: Diagnosis not present

## 2016-08-11 DIAGNOSIS — S82202A Unspecified fracture of shaft of left tibia, initial encounter for closed fracture: Secondary | ICD-10-CM | POA: Diagnosis not present

## 2016-08-11 DIAGNOSIS — M79605 Pain in left leg: Secondary | ICD-10-CM | POA: Diagnosis not present

## 2016-08-11 DIAGNOSIS — E1165 Type 2 diabetes mellitus with hyperglycemia: Secondary | ICD-10-CM | POA: Diagnosis not present

## 2016-08-11 DIAGNOSIS — I2510 Atherosclerotic heart disease of native coronary artery without angina pectoris: Secondary | ICD-10-CM | POA: Diagnosis not present

## 2016-08-11 DIAGNOSIS — J9611 Chronic respiratory failure with hypoxia: Secondary | ICD-10-CM | POA: Diagnosis not present

## 2016-08-11 DIAGNOSIS — I480 Paroxysmal atrial fibrillation: Secondary | ICD-10-CM | POA: Diagnosis not present

## 2016-08-11 LAB — GLUCOSE, CAPILLARY
Glucose-Capillary: 198 mg/dL — ABNORMAL HIGH (ref 65–99)
Glucose-Capillary: 227 mg/dL — ABNORMAL HIGH (ref 65–99)

## 2016-08-11 MED ORDER — ENOXAPARIN SODIUM 40 MG/0.4ML ~~LOC~~ SOLN: 40.0000 mg | SUBCUTANEOUS | 0 refills | Status: DC

## 2016-08-11 MED ORDER — DOCUSATE SODIUM 100 MG PO CAPS: 100.0000 mg | ORAL_CAPSULE | Freq: Two times a day (BID) | ORAL | 0 refills | Status: DC

## 2016-08-11 MED ORDER — OXYCODONE HCL 5 MG PO TABS
5.0000 mg | ORAL_TABLET | ORAL | 0 refills | Status: DC | PRN
Start: 2016-08-11 — End: 2016-12-25

## 2016-08-11 MED ORDER — PANTOPRAZOLE SODIUM 40 MG PO TBEC: 40.0000 mg | DELAYED_RELEASE_TABLET | Freq: Every day | ORAL | 0 refills | Status: DC

## 2016-08-11 MED ORDER — METHOCARBAMOL 500 MG PO TABS: 500.0000 mg | ORAL_TABLET | Freq: Four times a day (QID) | ORAL | 0 refills | Status: DC | PRN

## 2016-08-11 NOTE — Progress Notes (Signed)
Pt discharged to [facility] via stretcher and PTAR. Pt in stable condition and social work packet given to EMS attendant. Thomasene Lot, RN

## 2016-08-11 NOTE — Discharge Instructions (Addendum)
Tibial Fracture, Adult A tibial fracture is a break in the larger bone of your lower leg (tibia). This bone is also called the shin bone. CAUSES   Low-energy injuries, such as a fall from ground level.   High-energy injuries, such as motor vehicle injuries or high-speed sports collisions. RISK FACTORS  Jumping activities.   Repetitive stress, such as long-distance running.   Participation in sports.   Osteoporosis.   Advanced age.  SIGNS AND SYMPTOMS  Pain.   Swelling.   Inability to put weight on your injured leg.   Bone deformities at the site of your injury.   Bruising.  DIAGNOSIS  A tibial fracture can usually be diagnosed using X-rays. TREATMENT  A tibial fracture will often be treated with simple immobilization. A cast or splint will be used on your leg to keep it from moving while it heals. If the injury caused parts of the bone to move out of place, your health care provider may reposition those parts before putting on your cast or splint. The cast or splint will remain in place until your health care provider thinks the bone has healed well enough. Then you can begin range-of-motion exercises to regain your knee motion. For severe injuries, surgery is sometimes needed to insert plates or screws into the injured area. HOME CARE INSTRUCTIONS   If you have a plaster or fiberglass cast:   Do not try to scratch the skin under the cast using sharp or pointed objects.   Check the skin around the cast every day. You may put lotion on any red or sore areas.   Keep your cast dry and clean.   If you have a plaster splint:   Wear the splint as directed.   Loosen the elastic around the splint if your toes become numb, tingle, or turn cold or blue.   Do not put pressure on any part of your cast or splint until it is fully hardened.   Use a plastic bag to protect your cast or splint during bathing. Do not lower the cast or splint into water.   Use  crutches as directed.   Take medicines only as directed by your health care provider.   Keep all follow-up visits as directed by your health care provider. This is important.  SEEK MEDICAL CARE IF:  Your pain is becoming worse rather than better or is not controlled with medicines.   You have increased swelling or redness in your foot.   You begin to lose feeling in your foot or toes.  SEEK IMMEDIATE MEDICAL CARE IF:   Your foot or toes on the injured side feel cold or turn blue.   You develop severe pain in your injured leg, especially if the pain is increased with movement of your toes.  MAKE SURE YOU:  Understand these instructions.   Will watch your condition.   Will get help right away if you are not doing well or get worse.    This information is not intended to replace advice given to you by your health care provider. Make sure you discuss any questions you have with your health care provider.   Document Released: 07/24/2001 Document Revised: 03/15/2015 Document Reviewed: 12/23/2013 Elsevier Interactive Patient Education 2016 [place].   Postoperative Constipation Protocol   Constipation - defined medically as fewer than three stools per week and severe constipation as less than one stool per week.  One of the most common issues patients have following surgery is constipation.  Even if you have a regular bowel pattern at home, your normal regimen is likely to be disrupted due to multiple reasons following surgery. Combination of anesthesia, postoperative narcotics, change in appetite and fluid intake all can affect your bowels. In order to avoid complications following surgery, here are some recommendations in order to help you during your recovery period.  Colace (docusate) - Pick up an over-the-counter form of Colace or another stool softener and take twice a day as long as you are requiring postoperative pain medications. Take with a full glass of  water daily. If you experience loose stools or diarrhea, hold the colace until you stool forms back up. If your symptoms do not get better within 1 week or if they get worse, check with your doctor.  Dulcolax (bisacodyl) - Pick up over-the-counter and take as directed by the product packaging as needed to assist with the movement of your bowels. Take with a full glass of water. Use this product as needed if not relieved by Colace only.   MiraLax (polyethylene glycol) - Pick up over-the-counter to have on hand. MiraLax is a solution that will increase the amount of water in your bowels to assist with bowel movements. Take as directed and can mix with a glass of water, juice, soda, coffee, or tea. Take if you go more than two days without a movement. Do not use MiraLax more than once per day. Call your doctor if you are still constipated or irregular after using this medication for 7 days in a row.  If you continue to have problems with postoperative constipation, please contact the office for further assistance and recommendations. If you experience "the worst abdominal pain ever" or develop nausea or vomiting, please contact the office immediatly for further recommendations for treatment.   STRICT NON WEIGHT BEARING TO THE LEFT LEG  Continue elevation of left leg / splint on two pillows when not up OOB. Continue to use ice packs to keep splint and ankle cool around the distal leg and ankle.

## 2016-08-11 NOTE — Clinical Social Work Placement (Signed)
   CLINICAL SOCIAL WORK PLACEMENT  NOTE  Date:  08/11/2016  Patient Details  Name: Paul [name] MRN: [id number] Date of Birth: November 26, 1940  Clinical Social Work is seeking post-discharge placement for this patient at the [place] level of care (*CSW will initial, date and re-position this form in  chart as items are completed):  Yes   Patient/family provided with [facility] Work Department's list of facilities offering this level of care within the geographic area requested by the patient (or if unable, by the patient's family).  Yes   Patient/family informed of their freedom to choose among providers that offer the needed level of care, that participate in Medicare, Medicaid or managed care program needed by the patient, have an available bed and are willing to accept the patient.  Yes   Patient/family informed of [facility]'s ownership interest in [facility] and [facility], as well as of the fact that they are under no obligation to receive care at these facilities.  PASRR submitted to EDS on 08/10/16     PASRR number received on 08/10/16     Existing PASRR number confirmed on       FL2 transmitted to all facilities in geographic area requested by pt/family on 08/10/16     FL2 transmitted to all facilities within larger geographic area on       Patient informed that his/her managed care company has contracts with or will negotiate with certain facilities, including the following:        Yes   Patient/family informed of bed offers received.  Patient chooses bed at [facility]     Physician recommends and patient chooses bed at      Patient to be transferred to [facility] on 08/11/16.  Patient to be transferred to facility by [facility]     Patient family notified on 08/11/16 of transfer.  Name of family member notified:  SPOUSE     PHYSICIAN       Additional Comment: Pt / spouse are in agreement  with d/c to [facility] today. [facility] transport is required. Medical necessity form completed. Son is aware out of pocket costs may be associated with [facility] transport. D/C Summary sent to SNF for review. Scripts included in d/c packet. # for report provided to nsg.    _______________________________________________ Luretha Rued, [place] 08/11/2016, 1:23 PM

## 2016-08-11 NOTE — Progress Notes (Signed)
Called in report to [facility]. Awaiting [facility] for transport/ Thomasene Lot, RN

## 2016-08-11 NOTE — Progress Notes (Signed)
[facility]  PROGRESS NOTE  Paul [name] KZL:[id number] DOB: 02/25/1941 DOA: 08/09/2016 PCP: Marjorie Smolder, MD   Brief HPI:  *75 year old male with history of coronary artery disease, COPD, paroxysmal atrial fibrillation not on blood thinners presented with left lower extremity fracture pain. Patient reported tripping and had a fall causing left lower extremity fracture. Admitted under orthopedic service. We were consulted for preop evaluation. .   Subjective    Patient continues to have pain in left lower extremity. About to be discharged to rehab today.    Assessment/Plan:     *Left tibial metaphysis fracture with distal fibular fracture: Management as per primary service/orthopedic service. Preoperative evaluation was already done yesterday. Patient is at moderate surgical risk given multiple medical conditions and orthopedic procedure. Further management including pain rehabilitation and possible surgical intervention as per orthopedic team.  Essential hypertension: Elevated blood pressure likely contributed by pain. Patient is currently on losartan. Monitor blood pressure. Pain management. Restart HCTZ.  History of coronary artery disease, paroxysmal atrial fibrillation: Patient is a stable this time. Recently evaluated by cardiologist. Currently on aspirin, statin. Restart home medications.  History of COPD: Stable this time continue bronchodilators and respiratory therapy.  Type 2 Diabetes mellitus:  Continue sliding scale. Resume home medication on discharge.    Anti-infectives    None       Objective   Vitals:   08/10/16 1938 08/10/16 2044 08/11/16 0515 08/11/16 0839  BP:  (!) 147/54 (!) 129/54   Pulse:  70 83   Resp:  (!) 24 (!) 24   Temp:  99 F (37.2 C) 99.5 F (37.5 C)   TempSrc:  Oral Oral   SpO2: 90% 95% 91% 90%  Weight:      Height:        Intake/Output Summary (Last 24 hours) at 08/11/16 1056 Last data filed at 08/11/16 0600   Gross per 24 hour  Intake              720 ml  Output              825 ml  Net             -105 ml   Filed Weights   08/09/16 0943  Weight: 112.5 kg (248 lb)     Physical Examination:  General exam: Appears calm and comfortable. Respiratory system: Clear to auscultation. Respiratory effort normal. Cardiovascular system:  RRR. No  murmurs, rubs, gallops. No pedal edema. GI system: Abdomen is nondistended, soft and nontender. No organomegaly.  Central nervous system. No focal neurological deficits. 5 x 5 power in all extremities. Skin:  Left lower extremity in dressing Psychiatry: Alert, oriented x 3.Judgement and insight appear normal. Affect normal.    Data Reviewed: I have personally reviewed following labs and imaging studies  CBG:  Recent Labs Lab 08/10/16 0800 08/10/16 1157 08/10/16 1729 08/10/16 2214 08/11/16 0749  GLUCAP 147* 174* 177* 188* 198*    CBC:  Recent Labs Lab 08/09/16 1020 08/10/16 0443  WBC 9.0 7.9  NEUTROABS 7.1 5.1  HGB 12.9* 11.5*  HCT 38.7* 35.1*  MCV 83.0 83.8  PLT 201 390    Basic Metabolic Panel:  Recent Labs Lab 08/09/16 1020 08/10/16 0443  NA 134* 139  K 3.6 3.5  CL 102 106  CO2 25 26  GLUCOSE 222* 129*  BUN 17 13  CREATININE 0.72 0.58*  CALCIUM 8.9 8.6*    No results found for this or any previous visit (from  the past 240 hour(s)).   Liver Function Tests:  Recent Labs Lab 08/10/16 0443  AST 20  ALT 19  ALKPHOS 32*  BILITOT 0.8  PROT 6.1*  ALBUMIN 3.5   No results for input(s): LIPASE, AMYLASE in the last 168 hours. No results for input(s): AMMONIA in the last 168 hours.  Cardiac Enzymes:  Recent Labs Lab 08/09/16 1020  CKTOTAL 130   BNP (last 3 results) No results for input(s): BNP in the last 8760 hours.  ProBNP (last 3 results) No results for input(s): PROBNP in the last 8760 hours.    Studies: Dg Ankle Complete Left  Result Date: 08/09/2016 CLINICAL DATA:  [place] yesterday with pain  EXAM: LEFT ANKLE COMPLETE - 3+ VIEW COMPARISON:  Left tibia films of 08/08/2016 FINDINGS: There is a nondisplaced fracture of the distal left fibula. Also there is a small avulsion fracture fragment from the tip of the medial malleolus. The ankle joint is unremarkable. Alignment is normal. IMPRESSION: 1. Oblique nondisplaced fracture through the distal left fibula. 2. Small avulsion fracture fragment from the tip of the medial malleolus. Electronically Signed   By: Ivar Drape M.D.   On: 08/09/2016 13:21   Ct Knee Left Wo Contrast  Result Date: 08/09/2016 CLINICAL DATA:  Evaluate proximal tibial fracture. EXAM: CT OF THE left KNEE WITHOUT CONTRAST TECHNIQUE: Multidetector CT imaging of the left knee was performed according to the standard protocol. Multiplanar CT image reconstructions were also generated. COMPARISON:  Radiographs 08/08/2016. FINDINGS: Bones/Joint/Cartilage There is the largely transverse fracture through the metaphyseal region of the tibia. I do not see definite involvement of the articular surface of the tibia. Anteriorly the fracture courses through the base of the tibial tuberosity and because of the pull of the patellar tendon this segment of the fracture is displaced/the avulsed. Maximum displacement is 17 mm. Small joint effusion.  Mild degenerative changes. The femur is intact.  No patella fracture.  The fibula is intact. Ligaments Suboptimally assessed by CT. Muscles and Tendons The quadriceps and patellar tendons appear to be intact. No significant or obvious muscle injury. Soft tissues Significant subcutaneous soft tissue swelling/ edema/ hematoma involving the anterior aspect of the. IMPRESSION: 1. Largely transverse fracture through the metaphyseal region of the proximal tibia within a displaced/avulsed segment of the tibial tuberosity by the pull of the patellar tendon. 2. No evidence of involvement of the tibial plateau. 3. Small joint effusion. Electronically Signed   By: Marijo Sanes M.D.   On: 08/09/2016 13:13    Scheduled Meds: . aspirin EC  81 mg Oral Q supper  . docusate sodium  100 mg Oral BID  . enoxaparin (LOVENOX) injection  40 mg Subcutaneous Q24H  . fluticasone  1 spray Each Nare BID  . gabapentin  1,200 mg Oral QHS  . insulin aspart  0-15 Units Subcutaneous TID WC  . losartan  50 mg Oral Q breakfast  . metoCLOPramide  10 mg Oral BID  . mometasone-formoterol  2 puff Inhalation BID  . ondansetron (ZOFRAN) IV  4 mg Intravenous Once  . pantoprazole  40 mg Oral Daily  . pravastatin  40 mg Oral QHS    Continuous Infusions:   Time spent: 20 min  [facility] Pager [phone]. If 7PM-7AM, please contact night-coverage at [email or web address], Office  [phone]  password [email or web address] 08/11/2016, 10:56 AM  LOS: 1 day

## 2016-08-11 NOTE — Discharge Summary (Signed)
Physician Discharge Summary   Patient ID: Paul [name] MRN: [id number] DOB/AGE: Dec 25, 1940 75 y.o.  Admit date: 08/09/2016 Discharge date: 08/11/2016  Primary Diagnosis:  Left tibial metaphysis fracture with left distal fibula fracture  Admission Diagnoses:  Past Medical History:  Diagnosis Date  . Asthma   . Atrial fibrillation ([place])   . CAD (coronary artery disease)    Prior PTCA  . COPD (chronic obstructive pulmonary disease) ([place])   . Diabetes mellitus   . GERD (gastroesophageal reflux disease)   . Hyperlipidemia   . Hypertension   . lung ca dx'd 01/2006  . Nephrolithiasis   . Osteopenia   . Pulmonary embolus ([place])   . Tremor, essential    Discharge Diagnoses:   Principal Problem:   Tibial fracture Active Problems:   COPD GOLD II/ still smoking    AF (paroxysmal atrial fibrillation) (H[facility])   CAD (coronary artery disease)   Hypertension   CAFL (chronic airflow limitation) (HCC)   Diabetes mellitus type 2, uncontrolled (HCC)   Acid reflux   Benign essential HTN   OP (osteoporosis)   Peripheral neuralgia   Chronic respiratory failure (HCC)   Morbid obesity (HCC)   Tibia fracture  Estimated body mass index is 38.84 kg/m as calculated from the following:   Height as of this encounter: _0  (1.702 m).   Weight as of this encounter: 112.5 kg (248 lb).  Procedure:     Consults: Medicine Consult  HPI: Paul [name] is a 75 y.o. male who complains of  Left leg pain following a fall yesterday.  He has Pmhx of, CAD, COPD and afib not on blood thinners.  Sustained a fall yesterday and was diagnosed with left proximal tibia and left distal fibula fracture, and sent home with outpt follow up.  Returns to ED today complaining of increased pain and swelling.  Admitted for pain control, bedrest and strict elevation of the left leg.  Laboratory Data: Admission on 08/09/2016  Component Date Value Ref Range Status  . WBC 08/09/2016 9.0  4.0 - 10.5 K/uL Final  .  RBC 08/09/2016 4.66  4.22 - 5.81 MIL/uL Final  . Hemoglobin 08/09/2016 12.9* 13.0 - 17.0 g/dL Final  . HCT 08/09/2016 38.7* 39.0 - 52.0 % Final  . MCV 08/09/2016 83.0  78.0 - 100.0 fL Final  . MCH 08/09/2016 27.7  26.0 - 34.0 pg Final  . MCHC 08/09/2016 33.3  30.0 - 36.0 g/dL Final  . RDW 08/09/2016 15.3  11.5 - 15.5 % Final  . Platelets 08/09/2016 201  150 - 400 K/uL Final  . Neutrophils Relative % 08/09/2016 78  % Final  . Neutro Abs 08/09/2016 7.1  1.7 - 7.7 K/uL Final  . Lymphocytes Relative 08/09/2016 10  % Final  . Lymphs Abs 08/09/2016 0.9  0.7 - 4.0 K/uL Final  . Monocytes Relative 08/09/2016 11  % Final  . Monocytes Absolute 08/09/2016 1.0  0.1 - 1.0 K/uL Final  . Eosinophils Relative 08/09/2016 1  % Final  . Eosinophils Absolute 08/09/2016 0.1  0.0 - 0.7 K/uL Final  . Basophils Relative 08/09/2016 0  % Final  . Basophils Absolute 08/09/2016 0.0  0.0 - 0.1 K/uL Final  . Sodium 08/09/2016 134* 135 - 145 mmol/L Final  . Potassium 08/09/2016 3.6  3.5 - 5.1 mmol/L Final  . Chloride 08/09/2016 102  101 - 111 mmol/L Final  . CO2 08/09/2016 25  22 - 32 mmol/L Final  . Glucose, Bld 08/09/2016 222* 65 -  99 mg/dL Final  . BUN 08/09/2016 17  6 - 20 mg/dL Final  . Creatinine, Ser 08/09/2016 0.72  0.61 - 1.24 mg/dL Final  . Calcium 08/09/2016 8.9  8.9 - 10.3 mg/dL Final  . GFR calc non Af Amer 08/09/2016 >60  >60 mL/min Final  . GFR calc Af Amer 08/09/2016 >60  >60 mL/min Final   Comment: (NOTE) The eGFR has been calculated using the CKD EPI equation. This calculation has not been validated in all clinical situations. eGFR's persistently <60 mL/min signify possible Chronic Kidney Disease.   . Anion gap 08/09/2016 7  5 - 15 Final  . Prothrombin Time 08/09/2016 13.1  11.4 - 15.2 seconds Final  . INR 08/09/2016 0.99   Final  . Total CK 08/09/2016 130  49 - 397 U/L Final  . Hgb A1c MFr Bld 08/10/2016 6.5* 4.8 - 5.6 % Final   Comment: (NOTE)         Pre-diabetes: 5.7 - 6.4          Diabetes: >6.4         Glycemic control for adults with diabetes: <7.0   . Mean Plasma Glucose 08/10/2016 140  mg/dL Final   Comment: (NOTE) Performed At: [facility] [place], [place] [id number] Lindon Romp MD CX:[id number]   . WBC 08/10/2016 7.9  4.0 - 10.5 K/uL Final  . RBC 08/10/2016 4.19* 4.22 - 5.81 MIL/uL Final  . Hemoglobin 08/10/2016 11.5* 13.0 - 17.0 g/dL Final  . HCT 08/10/2016 35.1* 39.0 - 52.0 % Final  . MCV 08/10/2016 83.8  78.0 - 100.0 fL Final  . MCH 08/10/2016 27.4  26.0 - 34.0 pg Final  . MCHC 08/10/2016 32.8  30.0 - 36.0 g/dL Final  . RDW 08/10/2016 15.3  11.5 - 15.5 % Final  . Platelets 08/10/2016 199  150 - 400 K/uL Final  . Neutrophils Relative % 08/10/2016 66  % Final  . Neutro Abs 08/10/2016 5.1  1.7 - 7.7 K/uL Final  . Lymphocytes Relative 08/10/2016 18  % Final  . Lymphs Abs 08/10/2016 1.4  0.7 - 4.0 K/uL Final  . Monocytes Relative 08/10/2016 13  % Final  . Monocytes Absolute 08/10/2016 1.1* 0.1 - 1.0 K/uL Final  . Eosinophils Relative 08/10/2016 3  % Final  . Eosinophils Absolute 08/10/2016 0.3  0.0 - 0.7 K/uL Final  . Basophils Relative 08/10/2016 0  % Final  . Basophils Absolute 08/10/2016 0.0  0.0 - 0.1 K/uL Final  . Sodium 08/10/2016 139  135 - 145 mmol/L Final  . Potassium 08/10/2016 3.5  3.5 - 5.1 mmol/L Final  . Chloride 08/10/2016 106  101 - 111 mmol/L Final  . CO2 08/10/2016 26  22 - 32 mmol/L Final  . Glucose, Bld 08/10/2016 129* 65 - 99 mg/dL Final  . BUN 08/10/2016 13  6 - 20 mg/dL Final  . Creatinine, Ser 08/10/2016 0.58* 0.61 - 1.24 mg/dL Final  . Calcium 08/10/2016 8.6* 8.9 - 10.3 mg/dL Final  . Total Protein 08/10/2016 6.1* 6.5 - 8.1 g/dL Final  . Albumin 08/10/2016 3.5  3.5 - 5.0 g/dL Final  . AST 08/10/2016 20  15 - 41 U/L Final  . ALT 08/10/2016 19  17 - 63 U/L Final  . Alkaline Phosphatase 08/10/2016 32* 38 - 126 U/L Final  . Total Bilirubin 08/10/2016 0.8  0.3 - 1.2 mg/dL Final  . GFR calc non Af  Amer 08/10/2016 >60  >60 mL/min Final  . GFR calc Af  Amer 08/10/2016 >60  >60 mL/min Final   Comment: (NOTE) The eGFR has been calculated using the CKD EPI equation. This calculation has not been validated in all clinical situations. eGFR's persistently <60 mL/min signify possible Chronic Kidney Disease.   . Anion gap 08/10/2016 7  5 - 15 Final  . Glucose-Capillary 08/09/2016 172* 65 - 99 mg/dL Final  . Glucose-Capillary 08/09/2016 158* 65 - 99 mg/dL Final  . Glucose-Capillary 08/10/2016 147* 65 - 99 mg/dL Final  . Glucose-Capillary 08/10/2016 174* 65 - 99 mg/dL Final  . Glucose-Capillary 08/10/2016 177* 65 - 99 mg/dL Final  . Glucose-Capillary 08/10/2016 188* 65 - 99 mg/dL Final  . Glucose-Capillary 08/11/2016 198* 65 - 99 mg/dL Final  Appointment on 08/01/2016  Component Date Value Ref Range Status  . FVC-Pre 08/01/2016 3.44  L Final  . FVC-%Pred-Pre 08/01/2016 93  % Final  . FVC-Post 08/01/2016 3.58  L Final  . FVC-%Pred-Post 08/01/2016 97  % Final  . FVC-%Change-Post 08/01/2016 4  % Final  . FEV1-Pre 08/01/2016 1.87  L Final  . FEV1-%Pred-Pre 08/01/2016 70  % Final  . FEV1-Post 08/01/2016 2.03  L Final  . FEV1-%Pred-Post 08/01/2016 76  % Final  . FEV1-%Change-Post 08/01/2016 8  % Final  . FEV6-Pre 08/01/2016 3.30  L Final  . FEV6-%Pred-Pre 08/01/2016 96  % Final  . FEV6-Post 08/01/2016 3.43  L Final  . FEV6-%Pred-Post 08/01/2016 100  % Final  . FEV6-%Change-Post 08/01/2016 4  % Final  . Pre FEV1/FVC ratio 08/01/2016 54  % Final  . FEV1FVC-%Pred-Pre 08/01/2016 75  % Final  . Post FEV1/FVC ratio 08/01/2016 57  % Final  . FEV1FVC-%Change-Post 08/01/2016 4  % Final  . Pre FEV6/FVC Ratio 08/01/2016 96  % Final  . FEV6FVC-%Pred-Pre 08/01/2016 102  % Final  . Post FEV6/FVC ratio 08/01/2016 97  % Final  . FEV6FVC-%Pred-Post 08/01/2016 103  % Final  . FEV6FVC-%Change-Post 08/01/2016 0  % Final  . FEF 25-75 Pre 08/01/2016 0.76  L/sec Final  . FEF2575-%Pred-Pre 08/01/2016 40  %  Final  . FEF 25-75 Post 08/01/2016 0.98  L/sec Final  . FEF2575-%Pred-Post 08/01/2016 52  % Final  . FEF2575-%Change-Post 08/01/2016 28  % Final  . RV 08/01/2016 2.20  L Final  . RV % pred 08/01/2016 91  % Final  . TLC 08/01/2016 5.96  L Final  . TLC % pred 08/01/2016 92  % Final  . DLCO unc 08/01/2016 10.59  ml/min/mmHg Final  . DLCO unc % pred 08/01/2016 37  % Final  . DLCO cor 08/01/2016 11.05  ml/min/mmHg Final  . DLCO cor % pred 08/01/2016 39  % Final  . DL/VA 08/01/2016 1.87  ml/min/mmHg/L Final  . DL/VA % pred 08/01/2016 42  % Final     X-Rays:Dg Tibia/fibula Left  Result Date: 08/08/2016 CLINICAL DATA:  Fall, pain from the knee to the ankle, deformity. EXAM: LEFT TIBIA AND FIBULA - 2 VIEW COMPARISON:  None. FINDINGS: There is a minimally displaced fracture of the proximal tibial metaphysis, incompletely imaged on the lateral view. A nondisplaced fracture of the distal fibular metaphysis is seen as well. IMPRESSION: 1. Minimally displaced proximal tibial metaphyseal fracture, suboptimally imaged. Dedicated views of the left knee are recommended in further initial evaluation. If a more aggressive approach is desired, CT without contrast is suggested. 2. Distal fibular metaphyseal fracture. Electronically Signed   By: Lorin Picket M.D.   On: 08/08/2016 16:43   Dg Ankle Complete Left  Result Date: 08/09/2016 CLINICAL  DATA:  Fell yesterday with pain EXAM: LEFT ANKLE COMPLETE - 3+ VIEW COMPARISON:  Left tibia films of 08/08/2016 FINDINGS: There is a nondisplaced fracture of the distal left fibula. Also there is a small avulsion fracture fragment from the tip of the medial malleolus. The ankle joint is unremarkable. Alignment is normal. IMPRESSION: 1. Oblique nondisplaced fracture through the distal left fibula. 2. Small avulsion fracture fragment from the tip of the medial malleolus. Electronically Signed   By: Ivar Drape M.D.   On: 08/09/2016 13:21   Ct Knee Left Wo Contrast  Result  Date: 08/09/2016 CLINICAL DATA:  Evaluate proximal tibial fracture. EXAM: CT OF THE left KNEE WITHOUT CONTRAST TECHNIQUE: Multidetector CT imaging of the left knee was performed according to the standard protocol. Multiplanar CT image reconstructions were also generated. COMPARISON:  Radiographs 08/08/2016. FINDINGS: Bones/Joint/Cartilage There is the largely transverse fracture through the metaphyseal region of the tibia. I do not see definite involvement of the articular surface of the tibia. Anteriorly the fracture courses through the base of the tibial tuberosity and because of the pull of the patellar tendon this segment of the fracture is displaced/the avulsed. Maximum displacement is 17 mm. Small joint effusion.  Mild degenerative changes. The femur is intact.  No patella fracture.  The fibula is intact. Ligaments Suboptimally assessed by CT. Muscles and Tendons The quadriceps and patellar tendons appear to be intact. No significant or obvious muscle injury. Soft tissues Significant subcutaneous soft tissue swelling/ edema/ hematoma involving the anterior aspect of the. IMPRESSION: 1. Largely transverse fracture through the metaphyseal region of the proximal tibia within a displaced/avulsed segment of the tibial tuberosity by the pull of the patellar tendon. 2. No evidence of involvement of the tibial plateau. 3. Small joint effusion. Electronically Signed   By: Marijo Sanes M.D.   On: 08/09/2016 13:13   Dg Knee Complete 4 Views Left  Result Date: 08/08/2016 CLINICAL DATA:  Slipped and fell in the garage today EXAM: LEFT KNEE - COMPLETE 4+ VIEW COMPARISON:  None. FINDINGS: Four views of the left knee submitted. There is mild displaced metaphyseal fracture in proximal left tibia. Degenerative changes are noted left knee joint. IMPRESSION: Mild displaced metaphysis fracture in proximal left tibia. Electronically Signed   By: Lahoma Crocker M.D.   On: 08/08/2016 16:41    EKG: Orders placed or performed  during the hospital encounter of 08/09/16  . EKG 12-Lead  . EKG 12-Lead  . EKG 12-Lead  . EKG 12-Lead  . EKG     Hospital Course: ZIAN DELAIR is a 75 y.o. who was admitted to [facility]. He was see in the ED for the above states problem and then transferred to the orthopaedic floor for fracture care.  They were given PO and IV analgesics for pain control. Medicine was consulted to assist with medical management of the patient.   PT was ordered to assist with gait and mobility.  Discharge planning consulted to help with postop disposition and equipment needs.  Social worker consulted to assist with placement of the patient.  Patient had a tough night on the evening of admission.  They started to get up OOB with therapy on day one.   Continued to work with therapy into day two.  Splint was checked and was clean and dry. Patient was seen in rounds and was ready to go to the SNF at that time.  Diet: Cardiac diet and Diabetic diet Activity:NWB to the left leg Follow-up: next  Tuesday 08/14/2016.  Please have [facility] call the office at [phone] to setup appointment with Dr. Victorino December. Disposition - Skilled nursing facility Discharged Condition: stable   Discharge Instructions    Bed to Chair Transfer    Complete by:  As directed    Call MD / Call 911    Complete by:  As directed    If you experience chest pain or shortness of breath, CALL 911 and be transported to the hospital emergency room.  If you develope a fever above 101 F, pus (white drainage) or increased drainage or redness at the wound, or calf pain, call your surgeon's office.   Constipation Prevention    Complete by:  As directed    Drink plenty of fluids.  Prune juice may be helpful.  You may use a stool softener, such as Colace (over the counter) 100 mg twice a day.  Use MiraLax (over the counter) for constipation as needed.   Diet - low sodium heart healthy    Complete by:  As directed    Diet Carb Modified     Complete by:  As directed    Discharge instructions    Complete by:  As directed    Postoperative Constipation Protocol   Constipation - defined medically as fewer than three stools per week and severe constipation as less than one stool per week.  One of the most common issues patients have following surgery is constipation.  Even if you have a regular bowel pattern at home, your normal regimen is likely to be disrupted due to multiple reasons following surgery.  Combination of anesthesia, postoperative narcotics, change in appetite and fluid intake all can affect your bowels.  In order to avoid complications following surgery, here are some recommendations in order to help you during your recovery period.  Colace (docusate) - Pick up an over-the-counter form of Colace or another stool softener and take twice a day as long as you are requiring postoperative pain medications.  Take with a full glass of water daily.  If you experience loose stools or diarrhea, hold the colace until you stool forms back up.  If your symptoms do not get better within 1 week or if they get worse, check with your doctor.  Dulcolax (bisacodyl) - Pick up over-the-counter and take as directed by the product packaging as needed to assist with the movement of your bowels.  Take with a full glass of water.  Use this product as needed if not relieved by Colace only.   MiraLax (polyethylene glycol) - Pick up over-the-counter to have on hand.  MiraLax is a solution that will increase the amount of water in your bowels to assist with bowel movements.  Take as directed and can mix with a glass of water, juice, soda, coffee, or tea.  Take if you go more than two days without a movement. Do not use MiraLax more than once per day. Call your doctor if you are still constipated or irregular after using this medication for 7 days in a row.  If you continue to have problems with postoperative constipation, please contact the office for  further assistance and recommendations.  If you experience "the worst abdominal pain ever" or develop nausea or vomiting, please contact the office immediatly for further recommendations for treatment.   STRICT NON WEIGHT BEARING TO THE LEFT LEG  Continue elevation of left leg / splint on two pillows when not up OOB. Continue to use ice packs to keep splint  and ankle cool around the distal leg and ankle.   Do not sit on low chairs, stoools or toilet seats, as it may be difficult to get up from low surfaces    Complete by:  As directed    Driving restrictions    Complete by:  As directed    No driving until released by the physician.   Increase activity slowly as tolerated    Complete by:  As directed    Lifting restrictions    Complete by:  As directed    No lifting until released by the physician.   Non weight bearing    Complete by:  As directed    Laterality:  left   Extremity:  Lower       Medication List    STOP taking these medications   aspirin 81 MG tablet   HYDROcodone-acetaminophen 5-325 MG tablet Commonly known as:  NORCO/VICODIN     TAKE these medications   acetaminophen 500 MG tablet Commonly known as:  TYLENOL Take 1,000 mg by mouth daily with breakfast.   budesonide-formoterol 160-4.5 MCG/ACT inhaler Commonly known as:  SYMBICORT Take 2 puffs first thing in am and then another 2 puffs about 12 hours later. What changed:  how much to take  how to take this  when to take this  additional instructions   calcium carbonate 600 MG Tabs tablet Commonly known as:  OS-CAL Take 600 mg by mouth daily with supper.   CENTRUM SILVER ADULT 50+ Tabs Take 1 tablet by mouth daily with breakfast.   docusate sodium 100 MG capsule Commonly known as:  COLACE Take 1 capsule (100 mg total) by mouth 2 (two) times daily. HOLD for loose stool or diarrhea   enoxaparin 40 MG/0.4ML injection Commonly known as:  LOVENOX Inject 0.4 mLs (40 mg total) into the skin  daily.   fexofenadine 180 MG tablet Commonly known as:  ALLEGRA Take 180 mg by mouth daily with breakfast.   fluticasone 50 MCG/ACT nasal spray Commonly known as:  FLONASE Place 1 spray into the nose 2 (two) times daily.   Fluticasone-Salmeterol 250-50 MCG/DOSE Aepb Commonly known as:  ADVAIR Inhale 1 puff into the lungs at bedtime.   gabapentin 300 MG capsule Commonly known as:  NEURONTIN Take 6 capsules by mouth at bedtime What changed:  how much to take  how to take this  when to take this  additional instructions   glimepiride 2 MG tablet Commonly known as:  AMARYL Take 2 mg by mouth 2 (two) times daily.   guaiFENesin 600 MG 12 hr tablet Commonly known as:  MUCINEX Take 1,200 mg by mouth daily with breakfast.   hydrochlorothiazide 12.5 MG tablet Commonly known as:  HYDRODIURIL Take 12.5 mg by mouth daily with breakfast.   losartan 50 MG tablet Commonly known as:  COZAAR Take 1 tablet (50 mg total) by mouth daily. What changed:  when to take this   metFORMIN 1000 MG tablet Commonly known as:  GLUCOPHAGE Take 1,000 mg by mouth 2 (two) times daily with a meal.   methocarbamol 500 MG tablet Commonly known as:  ROBAXIN Take 1 tablet (500 mg total) by mouth every 6 (six) hours as needed for muscle spasms.   metoCLOPramide 10 MG tablet Commonly known as:  REGLAN Take 10 mg by mouth 2 (two) times daily.   oxyCODONE 5 MG immediate release tablet Commonly known as:  Oxy IR/ROXICODONE Take 1-2 tablets (5-10 mg total) by mouth every 3 (three) hours  as needed for moderate pain, severe pain or breakthrough pain.   OXYGEN Place 2 L into the nose at bedtime. 2L of oxygen at night and 2L of oxygen when walking long distances   pantoprazole 40 MG tablet Commonly known as:  PROTONIX Take 1 tablet (40 mg total) by mouth daily.   pioglitazone 45 MG tablet Commonly known as:  ACTOS Take 45 mg by mouth daily with breakfast.   pravastatin 40 MG tablet Commonly  known as:  PRAVACHOL Take 1 tablet (40 mg total) by mouth daily. What changed:  when to take this   PROAIR HFA 108 (90 Base) MCG/ACT inhaler Generic drug:  albuterol Inhale 2 puffs into the lungs every 2 (two) hours as needed for wheezing or shortness of breath.   Vitamin D (Ergocalciferol) 50000 units Caps capsule Commonly known as:  DRISDOL Take 50,000 Units by mouth every Saturday.      Follow-up Information    Nicholes Stairs, MD. Schedule an appointment as soon as possible for a visit on 08/14/2016.   Specialty:  Orthopedic Surgery Why:  Call office ASAP to setup appointment on Tuesday 08/14/2016 with Dr. Victorino December. Please assit in arranging transporation for this patient. Contact information: [address] [place] [address] [phone]        Nicholes Stairs, MD .   Specialty:  Orthopedic Surgery Contact information: [address] [place] [address] [place] [address] [phone]           Signed: Arlee Muslim, PA-C Orthopaedic Surgery 08/11/2016, 9:23 AM

## 2016-08-11 NOTE — Progress Notes (Signed)
   Subjective:    Patient reports pain as mild and moderate.   Patient seen in rounds for Dr. Stann Mainland. Patient is well, but has had some minor complaints of pain in the left foot, requiring pain medications Patient is ready to go to the SNF ([facility]) today.  Objective: Vital signs in last 24 hours: Temp:  [98.4 F (36.9 C)-99.5 F (37.5 C)] 99.5 F (37.5 C) (09/30 0515) Pulse Rate:  [70-88] 83 (09/30 0515) Resp:  [22-24] 24 (09/30 0515) BP: (129-147)/(51-54) 129/54 (09/30 0515) SpO2:  [90 %-95 %] 90 % (09/30 0839)  Intake/Output from previous day:  Intake/Output Summary (Last 24 hours) at 08/11/16 0905 Last data filed at 08/11/16 0600  Gross per 24 hour  Intake              720 ml  Output              825 ml  Net             -105 ml    Intake/Output this shift: No intake/output data recorded.  Labs:  Recent Labs  08/09/16 1020 08/10/16 0443  HGB 12.9* 11.5*    Recent Labs  08/09/16 1020 08/10/16 0443  WBC 9.0 7.9  RBC 4.66 4.19*  HCT 38.7* 35.1*  PLT 201 199    Recent Labs  08/09/16 1020 08/10/16 0443  NA 134* 139  K 3.6 3.5  CL 102 106  CO2 25 26  BUN 17 13  CREATININE 0.72 0.58*  GLUCOSE 222* 129*  CALCIUM 8.9 8.6*    Recent Labs  08/09/16 1020  INR 0.99    EXAM: General - Patient is Alert, Appropriate and Oriented Extremity - Neurovascular intact Sensation intact distally splint - clean, dry Motor Function - intact, moving foot and toes well on exam.   Assessment/Plan:  Left tibial metaphysis fracture with left distal fibula fracture Past Medical History:  Diagnosis Date  . Asthma   . Atrial fibrillation ([place])   . CAD (coronary artery disease)    Prior PTCA  . COPD (chronic obstructive pulmonary disease) ([place])   . Diabetes mellitus   . GERD (gastroesophageal reflux disease)   . Hyperlipidemia   . Hypertension   . lung ca dx'd 01/2006  . Nephrolithiasis   . Osteopenia   . Pulmonary embolus ([place])   . Tremor, essential      Principal Problem:   Tibial fracture Active Problems:   COPD GOLD II/ still smoking    AF (paroxysmal atrial fibrillation) ([facility])   CAD (coronary artery disease)   Hypertension   CAFL (chronic airflow limitation) ([facility])   Diabetes mellitus type 2, uncontrolled ([facility]CC)   Acid reflux   Benign essential HTN   OP (osteoporosis)   Peripheral neuralgia   Chronic respiratory failure ([facility])   Morbid obesity ([facility])   Tibia fracture  Estimated body mass index is 38.84 kg/m as calculated from the following:   Height as of this encounter: _0  (1.702 m).   Weight as of this encounter: 112.5 kg (248 lb). Discharge to SNF Diet - Cardiac diet and Diabetic diet Follow up - in 1 week Activity - NWB to the left leg Disposition - Skilled nursing facility Condition Upon Discharge - Stable D/C Meds - See DC Summary DVT Prophylaxis - Aspirin and Lovenox   Arlee Muslim, PA-C Orthopaedic Surgery 08/11/2016, 9:05 AM

## 2016-08-11 NOTE — Progress Notes (Signed)
CSW assisting with d/c planning. Pt has a SNF bed at [facility] [place] today if stable for d/c. CSW will continue to follow to assist with d/c planning to SNF.  Werner Lean LCSW [phone]

## 2016-08-11 NOTE — NC FL2 (Signed)
[place] LEVEL OF CARE SCREENING TOOL     IDENTIFICATION  Patient Name: Paul [name] Birthdate: May 13, 1941 Sex: male Admission Date (Current Location): 08/09/2016  [facility] and [place] Number:  Herbalist and Address:  [facility],  [place] [address], [place]      Provider Number: [id number]  Attending Physician Name and Address:  Oswald Hillock, MD  Relative Name and Phone Number:       Current Level of Care: Hospital Recommended Level of Care: [place] Prior Approval Number:    Date Approved/Denied:   PASRR Number: [id number]  Discharge Plan: SNF    Current Diagnoses: Patient Active Problem List   Diagnosis Date Noted  . Tibial fracture 08/09/2016  . Tibia fracture 08/09/2016  . COPD (chronic obstructive pulmonary disease) ([place])   . Chronic respiratory failure with hypoxia ([place]) 09/11/2015  . Morbid obesity ([place]) 09/11/2015  . Chronic respiratory failure ([place]) 03/07/2015  . CAFL (chronic airflow limitation) ([place]) 10/27/2014  . Diabetes mellitus, type 2 ([place]) 10/27/2014  . Diabetes mellitus type 2, uncontrolled ([place]) 10/27/2014  . Acid reflux 10/27/2014  . Benign essential HTN 10/27/2014  . History of primary bronchial cancer 10/27/2014  . Extreme obesity ([place]) 10/27/2014  . OP (osteoporosis) 10/27/2014  . Peripheral neuralgia 10/27/2014  . Hypercholesterolemia without hypertriglyceridemia 10/27/2014  . Compulsive tobacco user syndrome 10/27/2014  . Cough 06/21/2014  . Bronchitis 06/10/2014  . CAD (coronary artery disease) 04/08/2012  . Hyperlipidemia 04/08/2012  . Hypertension 04/08/2012  . Cigarette smoker 04/08/2012  . AF (paroxysmal atrial fibrillation) ([place]) 03/27/2012  . Exercise hypoxemia 03/25/2012  . COPD GOLD II/ still smoking  03/25/2012  . DM (diabetes mellitus), type 2, uncontrolled with complications ([place]) 50/53/9767  . Malignant neoplasm of bronchus and lung, unspecified site  10/11/2011    Orientation RESPIRATION BLADDER Height & Weight     Self, Time, Situation, Place  Normal Continent Weight: 248 lb (112.5 kg) Height:  _0  (170.2 cm)  BEHAVIORAL SYMPTOMS/MOOD NEUROLOGICAL BOWEL NUTRITION STATUS  Other (Comment) (no behaviors)   Continent Diet  AMBULATORY STATUS COMMUNICATION OF NEEDS Skin   Extensive Assist Verbally Normal                       Personal Care Assistance Level of Assistance  Bathing, Feeding, Dressing Bathing Assistance: Limited assistance Feeding assistance: Independent Dressing Assistance: Limited assistance     Functional Limitations Info  Sight, Hearing, Speech Sight Info: Adequate Hearing Info: Adequate Speech Info: Adequate    SPECIAL CARE FACTORS FREQUENCY  PT (By licensed PT)     PT Frequency: 5 x wk              Contractures Contractures Info: Not present    Additional Factors Info  Code Status, Allergies, Insulin Sliding Scale Code Status Info: full code Allergies Info: CODEINE           Current Medications (08/11/2016):  This is the current hospital active medication list Current Facility-Administered Medications  Medication Dose Route Frequency Provider Last Rate Last Dose  . acetaminophen (TYLENOL) tablet 650 mg  650 mg Oral Q6H PRN Nicholes Stairs, MD       Or  . acetaminophen (TYLENOL) suppository 650 mg  650 mg Rectal Q6H PRN Nicholes Stairs, MD      . albuterol (PROVENTIL) (2.5 MG/3ML) 0.083% nebulizer solution 2.5 mg  2.5 mg Nebulization Q2H PRN Nicholes Stairs, MD      .  aspirin EC tablet 81 mg  81 mg Oral Q supper [place] Marisa Hua, MD   81 mg at 08/10/16 1640  . docusate sodium (COLACE) capsule 100 mg  100 mg Oral BID Nicholes Stairs, MD   100 mg at 08/10/16 0930  . enoxaparin (LOVENOX) injection 40 mg  40 mg Subcutaneous Q24H Nicholes Stairs, MD   40 mg at 08/10/16 2002  . fluticasone (FLONASE) 50 MCG/ACT nasal spray 1 spray  1 spray Each Nare BID Cl[place] Marisa Hua, MD   1 spray at 08/10/16 2136  . gabapentin (NEURONTIN) capsule 1,200 mg  1,200 mg Oral QHS Clanford Marisa Hua, MD   1,200 mg at 08/10/16 2135  . insulin aspart (novoLOG) injection 0-15 Units  0-15 Units Subcutaneous TID WC Clanford Marisa Hua, MD   3 Units at 08/11/16 0802  . losartan (COZAAR) tablet 50 mg  50 mg Oral Q breakfast Clanford Marisa Hua, MD   50 mg at 08/11/16 0806  . methocarbamol (ROBAXIN) tablet 500 mg  500 mg Oral Q6H PRN Nicholes Stairs, MD       Or  . methocarbamol (ROBAXIN) 500 mg in dextrose 5 % 50 mL IVPB  500 mg Intravenous Q6H PRN Nicholes Stairs, MD      . metoCLOPramide (REGLAN) tablet 10 mg  10 mg Oral BID Clan[place] Marisa Hua, MD   10 mg at 08/10/16 2135  . mometasone-formoterol (DULERA) 200-5 MCG/ACT inhaler 2 puff  2 puff Inhalation BID Cl[place] Marisa Hua, MD   2 puff at 08/10/16 1937  . morphine 2 MG/ML injection 2 mg  2 mg Intravenous Q2H PRN Nicholes Stairs, MD      . ondansetron [facility]) injection 4 mg  4 mg Intravenous Once Drenda Freeze, MD      . oxyCODONE (Oxy IR/ROXICODONE) immediate release tablet 5-10 mg  5-10 mg Oral Q3H PRN Nicholes Stairs, MD   10 mg at 08/11/16 0801  . pantoprazole (PROTONIX) EC tablet 40 mg  40 mg Oral Daily Clanford Marisa Hua, MD   40 mg at 08/10/16 0930  . pravastatin (PRAVACHOL) tablet 40 mg  40 mg Oral QHS Clanford Marisa Hua, MD   40 mg at 08/10/16 2135     Discharge Medications: Please see discharge summary for a list of discharge medications.  Relevant Imaging Results:  Relevant Lab Results:   Additional Information SS # [id number]  Billie Intriago, Randall An, LCSW

## 2016-08-12 DIAGNOSIS — I2510 Atherosclerotic heart disease of native coronary artery without angina pectoris: Secondary | ICD-10-CM | POA: Diagnosis not present

## 2016-08-12 DIAGNOSIS — K219 Gastro-esophageal reflux disease without esophagitis: Secondary | ICD-10-CM | POA: Diagnosis not present

## 2016-08-12 DIAGNOSIS — I10 Essential (primary) hypertension: Secondary | ICD-10-CM | POA: Diagnosis not present

## 2016-08-12 DIAGNOSIS — S82202D Unspecified fracture of shaft of left tibia, subsequent encounter for closed fracture with routine healing: Secondary | ICD-10-CM | POA: Diagnosis not present

## 2016-08-12 DIAGNOSIS — R2689 Other abnormalities of gait and mobility: Secondary | ICD-10-CM | POA: Diagnosis not present

## 2016-08-12 DIAGNOSIS — E118 Type 2 diabetes mellitus with unspecified complications: Secondary | ICD-10-CM | POA: Diagnosis not present

## 2016-08-12 DIAGNOSIS — J441 Chronic obstructive pulmonary disease with (acute) exacerbation: Secondary | ICD-10-CM | POA: Diagnosis not present

## 2016-08-12 DIAGNOSIS — M8000XD Age-related osteoporosis with current pathological fracture, unspecified site, subsequent encounter for fracture with routine healing: Secondary | ICD-10-CM | POA: Diagnosis not present

## 2016-08-12 DIAGNOSIS — S82401D Unspecified fracture of shaft of right fibula, subsequent encounter for closed fracture with routine healing: Secondary | ICD-10-CM | POA: Diagnosis not present

## 2016-08-12 DIAGNOSIS — I480 Paroxysmal atrial fibrillation: Secondary | ICD-10-CM | POA: Diagnosis not present

## 2016-08-12 DIAGNOSIS — M6281 Muscle weakness (generalized): Secondary | ICD-10-CM | POA: Diagnosis not present

## 2016-08-13 DIAGNOSIS — I2510 Atherosclerotic heart disease of native coronary artery without angina pectoris: Secondary | ICD-10-CM | POA: Diagnosis not present

## 2016-08-13 DIAGNOSIS — M6281 Muscle weakness (generalized): Secondary | ICD-10-CM | POA: Diagnosis not present

## 2016-08-13 DIAGNOSIS — I480 Paroxysmal atrial fibrillation: Secondary | ICD-10-CM | POA: Diagnosis not present

## 2016-08-13 DIAGNOSIS — J449 Chronic obstructive pulmonary disease, unspecified: Secondary | ICD-10-CM | POA: Diagnosis not present

## 2016-08-13 DIAGNOSIS — D649 Anemia, unspecified: Secondary | ICD-10-CM | POA: Diagnosis not present

## 2016-08-13 DIAGNOSIS — E559 Vitamin D deficiency, unspecified: Secondary | ICD-10-CM | POA: Diagnosis not present

## 2016-08-13 DIAGNOSIS — S8290XD Unspecified fracture of unspecified lower leg, subsequent encounter for closed fracture with routine healing: Secondary | ICD-10-CM | POA: Diagnosis not present

## 2016-08-13 DIAGNOSIS — W19XXXD Unspecified fall, subsequent encounter: Secondary | ICD-10-CM | POA: Diagnosis not present

## 2016-08-13 DIAGNOSIS — R2689 Other abnormalities of gait and mobility: Secondary | ICD-10-CM | POA: Diagnosis not present

## 2016-08-13 DIAGNOSIS — J441 Chronic obstructive pulmonary disease with (acute) exacerbation: Secondary | ICD-10-CM | POA: Diagnosis not present

## 2016-08-13 DIAGNOSIS — S82202D Unspecified fracture of shaft of left tibia, subsequent encounter for closed fracture with routine healing: Secondary | ICD-10-CM | POA: Diagnosis not present

## 2016-08-13 DIAGNOSIS — E785 Hyperlipidemia, unspecified: Secondary | ICD-10-CM | POA: Diagnosis not present

## 2016-08-13 DIAGNOSIS — E039 Hypothyroidism, unspecified: Secondary | ICD-10-CM | POA: Diagnosis not present

## 2016-08-13 DIAGNOSIS — R5383 Other fatigue: Secondary | ICD-10-CM | POA: Diagnosis not present

## 2016-08-13 DIAGNOSIS — I4891 Unspecified atrial fibrillation: Secondary | ICD-10-CM | POA: Diagnosis not present

## 2016-08-14 DIAGNOSIS — R2689 Other abnormalities of gait and mobility: Secondary | ICD-10-CM | POA: Diagnosis not present

## 2016-08-14 DIAGNOSIS — S82832A Other fracture of upper and lower end of left fibula, initial encounter for closed fracture: Secondary | ICD-10-CM | POA: Diagnosis not present

## 2016-08-14 DIAGNOSIS — I2510 Atherosclerotic heart disease of native coronary artery without angina pectoris: Secondary | ICD-10-CM | POA: Diagnosis not present

## 2016-08-14 DIAGNOSIS — S82202D Unspecified fracture of shaft of left tibia, subsequent encounter for closed fracture with routine healing: Secondary | ICD-10-CM | POA: Diagnosis not present

## 2016-08-14 DIAGNOSIS — M6281 Muscle weakness (generalized): Secondary | ICD-10-CM | POA: Diagnosis not present

## 2016-08-14 DIAGNOSIS — I480 Paroxysmal atrial fibrillation: Secondary | ICD-10-CM | POA: Diagnosis not present

## 2016-08-14 DIAGNOSIS — S82192A Other fracture of upper end of left tibia, initial encounter for closed fracture: Secondary | ICD-10-CM | POA: Diagnosis not present

## 2016-08-14 DIAGNOSIS — J441 Chronic obstructive pulmonary disease with (acute) exacerbation: Secondary | ICD-10-CM | POA: Diagnosis not present

## 2016-08-14 NOTE — Progress Notes (Signed)
Pt is being scheduled for preop appt; please place surgical orders in epic. Thanks.  

## 2016-08-15 ENCOUNTER — Other Ambulatory Visit: Payer: Self-pay | Admitting: Orthopedic Surgery

## 2016-08-15 DIAGNOSIS — R2689 Other abnormalities of gait and mobility: Secondary | ICD-10-CM | POA: Diagnosis not present

## 2016-08-15 DIAGNOSIS — M6281 Muscle weakness (generalized): Secondary | ICD-10-CM | POA: Diagnosis not present

## 2016-08-15 DIAGNOSIS — I480 Paroxysmal atrial fibrillation: Secondary | ICD-10-CM | POA: Diagnosis not present

## 2016-08-15 DIAGNOSIS — S82222A Displaced transverse fracture of shaft of left tibia, initial encounter for closed fracture: Secondary | ICD-10-CM

## 2016-08-15 DIAGNOSIS — I2510 Atherosclerotic heart disease of native coronary artery without angina pectoris: Secondary | ICD-10-CM | POA: Diagnosis not present

## 2016-08-15 DIAGNOSIS — J441 Chronic obstructive pulmonary disease with (acute) exacerbation: Secondary | ICD-10-CM | POA: Diagnosis not present

## 2016-08-15 DIAGNOSIS — S82202D Unspecified fracture of shaft of left tibia, subsequent encounter for closed fracture with routine healing: Secondary | ICD-10-CM | POA: Diagnosis not present

## 2016-08-16 DIAGNOSIS — M6281 Muscle weakness (generalized): Secondary | ICD-10-CM | POA: Diagnosis not present

## 2016-08-16 DIAGNOSIS — J441 Chronic obstructive pulmonary disease with (acute) exacerbation: Secondary | ICD-10-CM | POA: Diagnosis not present

## 2016-08-16 DIAGNOSIS — S82202D Unspecified fracture of shaft of left tibia, subsequent encounter for closed fracture with routine healing: Secondary | ICD-10-CM | POA: Diagnosis not present

## 2016-08-16 DIAGNOSIS — I2510 Atherosclerotic heart disease of native coronary artery without angina pectoris: Secondary | ICD-10-CM | POA: Diagnosis not present

## 2016-08-16 DIAGNOSIS — R2689 Other abnormalities of gait and mobility: Secondary | ICD-10-CM | POA: Diagnosis not present

## 2016-08-16 DIAGNOSIS — I480 Paroxysmal atrial fibrillation: Secondary | ICD-10-CM | POA: Diagnosis not present

## 2016-08-16 NOTE — Patient Instructions (Addendum)
RAFEAL SKIBICKI  08/16/2016   Your procedure is scheduled on: Monday 08/20/2016  Report to [facility] Main  Entrance take [place]  elevators to 3rd floor to  [place] at  1105 AM.  Call this number if you have problems the morning of surgery [phone]   Remember: ONLY 1 PERSON MAY GO WITH YOU TO SHORT STAY TO GET  READY MORNING OF [place].   Do not eat food  :After Midnight.   MAY HAVE CLEAR LIQUIDS FROM MIDNIGHT UP UNTIL 0705 AM THEN NOTHING UNTIL AFTER SURGERY!     CLEAR LIQUID DIET   Foods Allowed                                                                     Foods Excluded  Coffee and tea, regular and decaf                             liquids that you cannot  Plain Jell-O in any flavor                                             see through such as: Fruit ices (not with fruit pulp)                                     milk, soups, orange juice  Iced Popsicles                                    All solid food Carbonated beverages, regular and diet                                    Cranberry, grape and apple juices Sports drinks like Gatorade Lightly seasoned clear broth or consume(fat free) Sugar, honey syrup  Sample Menu Breakfast                                Lunch                                     Supper Cranberry juice                    Beef broth                            Chicken broth Jell-O                                     Grape juice  Apple juice Coffee or tea                        Jell-O                                      Popsicle                                                Coffee or tea                        Coffee or tea  _____________________________________________________________________     Take these medicines the morning of surgery with A SIP OF WATER: Protonix,, Reglan, Oxycodone if needed, use Proair inhaler if needed, use Flonase nasal spray                DO NOT  TAKE ANY DIABETIC MEDICATIONS DAY OF YOUR SURGERY!                               You may not have any metal on your body including hair pins and              piercings  Do not wear jewelry, make-up, lotions, powders or perfumes, deodorant             Do not wear nail polish.  Do not shave  48 hours prior to surgery.              Men may shave face and neck.   Do not bring valuables to the hospital. [place].  Contacts, dentures or bridgework may not be worn into surgery.  Leave suitcase in the car. After surgery it may be brought to your room.                 Please read over the following fact sheets you were given: _____________________________________________________________________             [facility] - Preparing for Surgery Before surgery, you can play an important role.  Because skin is not sterile, your skin needs to be as free of germs as possible.  You can reduce the number of germs on your skin by washing with CHG (chlorahexidine gluconate) soap before surgery.  CHG is an antiseptic cleaner which kills germs and bonds with the skin to continue killing germs even after washing. Please DO NOT use if you have an allergy to CHG or antibacterial soaps.  If your skin becomes reddened/irritated stop using the CHG and inform your nurse when you arrive at Short Stay. Do not shave (including legs and underarms) for at least 48 hours prior to the first CHG shower.  You may shave your face/neck. Please follow these instructions carefully:  1.  Shower with CHG Soap the night before surgery and the  morning of Surgery.  2.  If you choose to wash your hair, wash your hair first as usual with your  normal  shampoo.  3.  After you shampoo, rinse your hair and body thoroughly to remove the  shampoo.                           4.  Use CHG as you would any other liquid soap.  You can apply chg directly  to the skin and wash                        Gently with a scrungie or clean washcloth.  5.  Apply the CHG Soap to your body ONLY FROM THE NECK DOWN.   Do not use on face/ open                           Wound or open sores. Avoid contact with eyes, ears mouth and genitals (private parts).                       Wash face,  Genitals (private parts) with your normal soap.             6.  Wash thoroughly, paying special attention to the area where your surgery  will be performed.  7.  Thoroughly rinse your body with warm water from the neck down.  8.  DO NOT shower/wash with your normal soap after using and rinsing off  the CHG Soap.                9.  Pat yourself dry with a clean towel.            10.  Wear clean pajamas.            11.  Place clean sheets on your bed the night of your first shower and do not  sleep with pets. Day of Surgery : Do not apply any lotions/deodorants the morning of surgery.  Please wear clean clothes to the hospital/surgery center.  FAILURE TO FOLLOW THESE INSTRUCTIONS MAY RESULT IN THE CANCELLATION OF YOUR SURGERY PATIENT SIGNATURE_________________________________  NURSE SIGNATURE__________________________________  ________________________________________________________________________   Adam Phenix  An incentive spirometer is a tool that can help keep your lungs clear and active. This tool measures how well you are filling your lungs with each breath. Taking long deep breaths may help reverse or decrease the chance of developing breathing (pulmonary) problems (especially infection) following:  A long period of time when you are unable to move or be active. BEFORE THE PROCEDURE   If the spirometer includes an indicator to show your best effort, your nurse or respiratory therapist will set it to a desired goal.  If possible, sit up straight or lean slightly forward. Try not to slouch.  Hold the incentive spirometer in an upright position. INSTRUCTIONS FOR USE  1. Sit on the edge of your bed  if possible, or sit up as far as you can in bed or on a chair. 2. Hold the incentive spirometer in an upright position. 3. Breathe out normally. 4. Place the mouthpiece in your mouth and seal your lips tightly around it. 5. Breathe in slowly and as deeply as possible, raising the piston or the ball toward the top of the column. 6. Hold your breath for 3-5 seconds or for as long as possible. Allow the piston or ball to fall to the bottom of the column. 7. Remove the mouthpiece from your mouth and breathe out normally. 8. Rest for a few seconds and repeat Steps 1 through 7 at least 10 times  every 1-2 hours when you are awake. Take your time and take a few normal breaths between deep breaths. 9. The spirometer may include an indicator to show your best effort. Use the indicator as a goal to work toward during each repetition. 10. After each set of 10 deep breaths, practice coughing to be sure your lungs are clear. If you have an incision (the cut made at the time of surgery), support your incision when coughing by placing a pillow or rolled up towels firmly against it. Once you are able to get out of bed, walk around indoors and cough well. You may stop using the incentive spirometer when instructed by your caregiver.  RISKS AND COMPLICATIONS  Take your time so you do not get dizzy or light-headed.  If you are in pain, you may need to take or ask for pain medication before doing incentive spirometry. It is harder to take a deep breath if you are having pain. AFTER USE  Rest and breathe slowly and easily.  It can be helpful to keep track of a log of your progress. Your caregiver can provide you with a simple table to help with this. If you are using the spirometer at home, follow these instructions: [place] IF:   You are having difficultly using the spirometer.  You have trouble using the spirometer as often as instructed.  Your pain medication is not giving enough relief while  using the spirometer.  You develop fever of 100.5 F (38.1 C) or higher. SEEK IMMEDIATE MEDICAL CARE IF:   You cough up bloody sputum that had not been present before.  You develop fever of 102 F (38.9 C) or greater.  You develop worsening pain at or near the incision site. MAKE SURE YOU:   Understand these instructions.  Will watch your condition.  Will get help right away if you are not doing well or get worse. Document Released: 03/11/2007 Document Revised: 01/21/2012 Document Reviewed: 05/12/2007 [facility] Patient Information 2014 [place], [place].   ________________________________________________________________________

## 2016-08-17 ENCOUNTER — Encounter (HOSPITAL_COMMUNITY)
Admission: RE | Admit: 2016-08-17 | Discharge: 2016-08-17 | Disposition: A | Discharge status: Home / Self Care | Payer: Medicare Other | Source: Ambulatory Visit | Attending: Orthopedic Surgery | Admitting: Orthopedic Surgery

## 2016-08-17 ENCOUNTER — Encounter (HOSPITAL_COMMUNITY): Payer: Self-pay

## 2016-08-17 DIAGNOSIS — Z01818 Encounter for other preprocedural examination: Secondary | ICD-10-CM | POA: Insufficient documentation

## 2016-08-17 DIAGNOSIS — Z7901 Long term (current) use of anticoagulants: Secondary | ICD-10-CM | POA: Insufficient documentation

## 2016-08-17 DIAGNOSIS — F17210 Nicotine dependence, cigarettes, uncomplicated: Secondary | ICD-10-CM | POA: Insufficient documentation

## 2016-08-17 DIAGNOSIS — Z7984 Long term (current) use of oral hypoglycemic drugs: Secondary | ICD-10-CM | POA: Insufficient documentation

## 2016-08-17 DIAGNOSIS — I10 Essential (primary) hypertension: Secondary | ICD-10-CM | POA: Insufficient documentation

## 2016-08-17 DIAGNOSIS — Z9861 Coronary angioplasty status: Secondary | ICD-10-CM | POA: Insufficient documentation

## 2016-08-17 DIAGNOSIS — S82892A Other fracture of left lower leg, initial encounter for closed fracture: Secondary | ICD-10-CM | POA: Insufficient documentation

## 2016-08-17 DIAGNOSIS — Z85118 Personal history of other malignant neoplasm of bronchus and lung: Secondary | ICD-10-CM | POA: Insufficient documentation

## 2016-08-17 DIAGNOSIS — Z86711 Personal history of pulmonary embolism: Secondary | ICD-10-CM | POA: Insufficient documentation

## 2016-08-17 DIAGNOSIS — E785 Hyperlipidemia, unspecified: Secondary | ICD-10-CM | POA: Insufficient documentation

## 2016-08-17 DIAGNOSIS — Z8249 Family history of ischemic heart disease and other diseases of the circulatory system: Secondary | ICD-10-CM | POA: Insufficient documentation

## 2016-08-17 DIAGNOSIS — I2510 Atherosclerotic heart disease of native coronary artery without angina pectoris: Secondary | ICD-10-CM | POA: Insufficient documentation

## 2016-08-17 DIAGNOSIS — E119 Type 2 diabetes mellitus without complications: Secondary | ICD-10-CM | POA: Insufficient documentation

## 2016-08-17 DIAGNOSIS — X58XXXA Exposure to other specified factors, initial encounter: Secondary | ICD-10-CM | POA: Insufficient documentation

## 2016-08-17 DIAGNOSIS — J45909 Unspecified asthma, uncomplicated: Secondary | ICD-10-CM | POA: Insufficient documentation

## 2016-08-17 DIAGNOSIS — S82202A Unspecified fracture of shaft of left tibia, initial encounter for closed fracture: Secondary | ICD-10-CM | POA: Insufficient documentation

## 2016-08-17 DIAGNOSIS — I4891 Unspecified atrial fibrillation: Secondary | ICD-10-CM | POA: Insufficient documentation

## 2016-08-17 DIAGNOSIS — Z885 Allergy status to narcotic agent status: Secondary | ICD-10-CM | POA: Insufficient documentation

## 2016-08-17 DIAGNOSIS — Z79899 Other long term (current) drug therapy: Secondary | ICD-10-CM | POA: Insufficient documentation

## 2016-08-17 DIAGNOSIS — M8580 Other specified disorders of bone density and structure, unspecified site: Secondary | ICD-10-CM | POA: Insufficient documentation

## 2016-08-17 NOTE — Progress Notes (Signed)
   08/17/16 1525  OBSTRUCTIVE SLEEP APNEA  Have you ever been diagnosed with sleep apnea through a sleep study? No  Do you snore loudly (loud enough to be heard through closed doors)?  0  Do you often feel tired, fatigued, or sleepy during the daytime (such as falling asleep during driving or talking to someone)? 0  Has anyone observed you stop breathing during your sleep? 0  Do you have, or are you being treated for high blood pressure? 1  BMI more than 35 kg/m2? 1  Age > 50 (1-yes) 1  Neck circumference greater than:Male 16 inches or larger, Male 17inches or larger? 1  Male Gender (Yes=1) 1  Obstructive Sleep Apnea Score 5  Score 5 or greater  Results sent to PCP

## 2016-08-18 DIAGNOSIS — J441 Chronic obstructive pulmonary disease with (acute) exacerbation: Secondary | ICD-10-CM | POA: Diagnosis not present

## 2016-08-18 DIAGNOSIS — I2510 Atherosclerotic heart disease of native coronary artery without angina pectoris: Secondary | ICD-10-CM | POA: Diagnosis not present

## 2016-08-18 DIAGNOSIS — S82202D Unspecified fracture of shaft of left tibia, subsequent encounter for closed fracture with routine healing: Secondary | ICD-10-CM | POA: Diagnosis not present

## 2016-08-18 DIAGNOSIS — R2689 Other abnormalities of gait and mobility: Secondary | ICD-10-CM | POA: Diagnosis not present

## 2016-08-18 DIAGNOSIS — I480 Paroxysmal atrial fibrillation: Secondary | ICD-10-CM | POA: Diagnosis not present

## 2016-08-18 DIAGNOSIS — M6281 Muscle weakness (generalized): Secondary | ICD-10-CM | POA: Diagnosis not present

## 2016-08-19 DIAGNOSIS — J441 Chronic obstructive pulmonary disease with (acute) exacerbation: Secondary | ICD-10-CM | POA: Diagnosis not present

## 2016-08-19 DIAGNOSIS — I480 Paroxysmal atrial fibrillation: Secondary | ICD-10-CM | POA: Diagnosis not present

## 2016-08-19 DIAGNOSIS — I2510 Atherosclerotic heart disease of native coronary artery without angina pectoris: Secondary | ICD-10-CM | POA: Diagnosis not present

## 2016-08-19 DIAGNOSIS — M6281 Muscle weakness (generalized): Secondary | ICD-10-CM | POA: Diagnosis not present

## 2016-08-19 DIAGNOSIS — S82202D Unspecified fracture of shaft of left tibia, subsequent encounter for closed fracture with routine healing: Secondary | ICD-10-CM | POA: Diagnosis not present

## 2016-08-19 DIAGNOSIS — R2689 Other abnormalities of gait and mobility: Secondary | ICD-10-CM | POA: Diagnosis not present

## 2016-08-20 DIAGNOSIS — S82202D Unspecified fracture of shaft of left tibia, subsequent encounter for closed fracture with routine healing: Secondary | ICD-10-CM | POA: Diagnosis not present

## 2016-08-20 DIAGNOSIS — J441 Chronic obstructive pulmonary disease with (acute) exacerbation: Secondary | ICD-10-CM | POA: Diagnosis not present

## 2016-08-20 DIAGNOSIS — R2689 Other abnormalities of gait and mobility: Secondary | ICD-10-CM | POA: Diagnosis not present

## 2016-08-20 DIAGNOSIS — M6281 Muscle weakness (generalized): Secondary | ICD-10-CM | POA: Diagnosis not present

## 2016-08-20 DIAGNOSIS — I480 Paroxysmal atrial fibrillation: Secondary | ICD-10-CM | POA: Diagnosis not present

## 2016-08-20 DIAGNOSIS — I2510 Atherosclerotic heart disease of native coronary artery without angina pectoris: Secondary | ICD-10-CM | POA: Diagnosis not present

## 2016-08-20 NOTE — Progress Notes (Signed)
Spoke with wife, Santiago Glad and told wife that Dawn, nurse had verified she had received the amended preop instructions for patient.  Also informed wife and patient that patient could have clear liquids from  35mdnite until 1100am morning of surgery.  No solid foods after midnite  10/21/2016.  And for patient to eat a good healthy snack prior to bedtime.  Patient and wife voice understanding.

## 2016-08-20 NOTE — Progress Notes (Signed)
Instructions have been received by nurse, [place] at [employer] per [facility]

## 2016-08-20 NOTE — Progress Notes (Signed)
Spoke with wife, Santiago Glad, regarding new date and time of surgery along with new arrival time.  Voices understanding.  Also faxed to [place] revised preop instructions.  Wife to go to [facility] to pick up more hibiclens.

## 2016-08-21 DIAGNOSIS — M6281 Muscle weakness (generalized): Secondary | ICD-10-CM | POA: Diagnosis not present

## 2016-08-21 DIAGNOSIS — I2510 Atherosclerotic heart disease of native coronary artery without angina pectoris: Secondary | ICD-10-CM | POA: Diagnosis not present

## 2016-08-21 DIAGNOSIS — I480 Paroxysmal atrial fibrillation: Secondary | ICD-10-CM | POA: Diagnosis not present

## 2016-08-21 DIAGNOSIS — S82202D Unspecified fracture of shaft of left tibia, subsequent encounter for closed fracture with routine healing: Secondary | ICD-10-CM | POA: Diagnosis not present

## 2016-08-21 DIAGNOSIS — J441 Chronic obstructive pulmonary disease with (acute) exacerbation: Secondary | ICD-10-CM | POA: Diagnosis not present

## 2016-08-21 DIAGNOSIS — R2689 Other abnormalities of gait and mobility: Secondary | ICD-10-CM | POA: Diagnosis not present

## 2016-08-22 ENCOUNTER — Inpatient Hospital Stay (HOSPITAL_COMMUNITY): Payer: Medicare Other

## 2016-08-22 ENCOUNTER — Inpatient Hospital Stay (HOSPITAL_COMMUNITY): Payer: Medicare Other | Admitting: Anesthesiology

## 2016-08-22 ENCOUNTER — Inpatient Hospital Stay (HOSPITAL_COMMUNITY)
Admission: RE | Admit: 2016-08-22 | Discharge: 2016-08-26 | DRG: 494 | Disposition: A | Discharge status: Skilled Nursing Facility | Payer: Medicare Other | Source: Ambulatory Visit | Attending: Orthopedic Surgery | Admitting: Orthopedic Surgery

## 2016-08-22 ENCOUNTER — Encounter (HOSPITAL_COMMUNITY): Payer: Self-pay | Admitting: *Deleted

## 2016-08-22 ENCOUNTER — Encounter (HOSPITAL_COMMUNITY)
Admission: RE | Disposition: A | Discharge status: Skilled Nursing Facility | Payer: Self-pay | Source: Ambulatory Visit | Attending: Orthopedic Surgery

## 2016-08-22 DIAGNOSIS — J449 Chronic obstructive pulmonary disease, unspecified: Secondary | ICD-10-CM | POA: Diagnosis not present

## 2016-08-22 DIAGNOSIS — M6281 Muscle weakness (generalized): Secondary | ICD-10-CM | POA: Diagnosis not present

## 2016-08-22 DIAGNOSIS — M8000XD Age-related osteoporosis with current pathological fracture, unspecified site, subsequent encounter for fracture with routine healing: Secondary | ICD-10-CM | POA: Diagnosis not present

## 2016-08-22 DIAGNOSIS — Z87442 Personal history of urinary calculi: Secondary | ICD-10-CM | POA: Diagnosis not present

## 2016-08-22 DIAGNOSIS — S82401D Unspecified fracture of shaft of right fibula, subsequent encounter for closed fracture with routine healing: Secondary | ICD-10-CM | POA: Diagnosis not present

## 2016-08-22 DIAGNOSIS — Z9861 Coronary angioplasty status: Secondary | ICD-10-CM

## 2016-08-22 DIAGNOSIS — G250 Essential tremor: Secondary | ICD-10-CM | POA: Diagnosis present

## 2016-08-22 DIAGNOSIS — S82102A Unspecified fracture of upper end of left tibia, initial encounter for closed fracture: Secondary | ICD-10-CM | POA: Diagnosis present

## 2016-08-22 DIAGNOSIS — Z85118 Personal history of other malignant neoplasm of bronchus and lung: Secondary | ICD-10-CM

## 2016-08-22 DIAGNOSIS — Y92008 Other place in unspecified non-institutional (private) residence as the place of occurrence of the external cause: Secondary | ICD-10-CM | POA: Diagnosis not present

## 2016-08-22 DIAGNOSIS — I10 Essential (primary) hypertension: Secondary | ICD-10-CM | POA: Diagnosis not present

## 2016-08-22 DIAGNOSIS — S82122A Displaced fracture of lateral condyle of left tibia, initial encounter for closed fracture: Secondary | ICD-10-CM | POA: Diagnosis not present

## 2016-08-22 DIAGNOSIS — F17210 Nicotine dependence, cigarettes, uncomplicated: Secondary | ICD-10-CM | POA: Diagnosis present

## 2016-08-22 DIAGNOSIS — S82892A Other fracture of left lower leg, initial encounter for closed fracture: Secondary | ICD-10-CM | POA: Diagnosis present

## 2016-08-22 DIAGNOSIS — S82209A Unspecified fracture of shaft of unspecified tibia, initial encounter for closed fracture: Secondary | ICD-10-CM | POA: Diagnosis present

## 2016-08-22 DIAGNOSIS — I4891 Unspecified atrial fibrillation: Secondary | ICD-10-CM | POA: Diagnosis present

## 2016-08-22 DIAGNOSIS — Z419 Encounter for procedure for purposes other than remedying health state, unspecified: Secondary | ICD-10-CM

## 2016-08-22 DIAGNOSIS — S82152A Displaced fracture of left tibial tuberosity, initial encounter for closed fracture: Principal | ICD-10-CM | POA: Diagnosis present

## 2016-08-22 DIAGNOSIS — E118 Type 2 diabetes mellitus with unspecified complications: Secondary | ICD-10-CM | POA: Diagnosis not present

## 2016-08-22 DIAGNOSIS — S82302A Unspecified fracture of lower end of left tibia, initial encounter for closed fracture: Secondary | ICD-10-CM | POA: Diagnosis not present

## 2016-08-22 DIAGNOSIS — I2510 Atherosclerotic heart disease of native coronary artery without angina pectoris: Secondary | ICD-10-CM | POA: Diagnosis present

## 2016-08-22 DIAGNOSIS — Y939 Activity, unspecified: Secondary | ICD-10-CM

## 2016-08-22 DIAGNOSIS — Z86711 Personal history of pulmonary embolism: Secondary | ICD-10-CM

## 2016-08-22 DIAGNOSIS — M8580 Other specified disorders of bone density and structure, unspecified site: Secondary | ICD-10-CM | POA: Diagnosis not present

## 2016-08-22 DIAGNOSIS — K219 Gastro-esophageal reflux disease without esophagitis: Secondary | ICD-10-CM | POA: Diagnosis not present

## 2016-08-22 DIAGNOSIS — S8262XA Displaced fracture of lateral malleolus of left fibula, initial encounter for closed fracture: Secondary | ICD-10-CM | POA: Diagnosis not present

## 2016-08-22 DIAGNOSIS — W010XXA Fall on same level from slipping, tripping and stumbling without subsequent striking against object, initial encounter: Secondary | ICD-10-CM | POA: Diagnosis present

## 2016-08-22 DIAGNOSIS — E785 Hyperlipidemia, unspecified: Secondary | ICD-10-CM | POA: Diagnosis not present

## 2016-08-22 DIAGNOSIS — Z8249 Family history of ischemic heart disease and other diseases of the circulatory system: Secondary | ICD-10-CM

## 2016-08-22 DIAGNOSIS — E1165 Type 2 diabetes mellitus with hyperglycemia: Secondary | ICD-10-CM | POA: Diagnosis not present

## 2016-08-22 DIAGNOSIS — J45909 Unspecified asthma, uncomplicated: Secondary | ICD-10-CM | POA: Diagnosis not present

## 2016-08-22 DIAGNOSIS — I480 Paroxysmal atrial fibrillation: Secondary | ICD-10-CM | POA: Diagnosis not present

## 2016-08-22 DIAGNOSIS — M9722XA Periprosthetic fracture around internal prosthetic left ankle joint, initial encounter: Secondary | ICD-10-CM | POA: Diagnosis not present

## 2016-08-22 DIAGNOSIS — J441 Chronic obstructive pulmonary disease with (acute) exacerbation: Secondary | ICD-10-CM | POA: Diagnosis not present

## 2016-08-22 DIAGNOSIS — S82112A Displaced fracture of left tibial spine, initial encounter for closed fracture: Secondary | ICD-10-CM | POA: Diagnosis not present

## 2016-08-22 DIAGNOSIS — S82202D Unspecified fracture of shaft of left tibia, subsequent encounter for closed fracture with routine healing: Secondary | ICD-10-CM | POA: Diagnosis not present

## 2016-08-22 DIAGNOSIS — R2689 Other abnormalities of gait and mobility: Secondary | ICD-10-CM | POA: Diagnosis not present

## 2016-08-22 HISTORY — PX: ORIF TIBIA FRACTURE: SHX5416

## 2016-08-22 LAB — GLUCOSE, CAPILLARY
Glucose-Capillary: 105 mg/dL — ABNORMAL HIGH (ref 65–99)
Glucose-Capillary: 133 mg/dL — ABNORMAL HIGH (ref 65–99)

## 2016-08-22 SURGERY — OPEN REDUCTION INTERNAL FIXATION (ORIF) TIBIA FRACTURE
Anesthesia: General | Laterality: Left

## 2016-08-22 MED ORDER — METHOCARBAMOL 500 MG PO TABS
500.0000 mg | ORAL_TABLET | Freq: Four times a day (QID) | ORAL | Status: DC | PRN
  Administered 2016-08-22 – 2016-08-25 (×5): 500 mg via ORAL
  Filled 2016-08-22 (×5): qty 1

## 2016-08-22 MED ORDER — VITAMIN D (ERGOCALCIFEROL) 1.25 MG (50000 UNIT) PO CAPS
50000.0000 [IU] | ORAL_CAPSULE | ORAL | Status: DC
  Administered 2016-08-25: 50000 [IU] via ORAL
  Filled 2016-08-22: qty 1

## 2016-08-22 MED ORDER — OXYCODONE HCL 5 MG PO TABS
5.0000 mg | ORAL_TABLET | ORAL | Status: DC | PRN
  Administered 2016-08-22 – 2016-08-24 (×9): 10 mg via ORAL
  Administered 2016-08-24: 5 mg via ORAL
  Administered 2016-08-25 – 2016-08-26 (×5): 10 mg via ORAL
  Filled 2016-08-22 (×5): qty 2
  Filled 2016-08-22: qty 1
  Filled 2016-08-22 (×10): qty 2

## 2016-08-22 MED ORDER — PIOGLITAZONE HCL 45 MG PO TABS
45.0000 mg | ORAL_TABLET | Freq: Every day | ORAL | Status: DC
  Administered 2016-08-23 – 2016-08-26 (×4): 45 mg via ORAL
  Filled 2016-08-22 (×5): qty 1

## 2016-08-22 MED ORDER — LORATADINE 10 MG PO TABS
10.0000 mg | ORAL_TABLET | Freq: Every day | ORAL | Status: DC
  Administered 2016-08-23 – 2016-08-26 (×4): 10 mg via ORAL
  Filled 2016-08-22 (×4): qty 1

## 2016-08-22 MED ORDER — FLUTICASONE PROPIONATE 50 MCG/ACT NA SUSP
1.0000 | Freq: Every day | NASAL | Status: DC
  Administered 2016-08-23 – 2016-08-26 (×4): 1 via NASAL
  Filled 2016-08-22: qty 16

## 2016-08-22 MED ORDER — ENOXAPARIN SODIUM 40 MG/0.4ML ~~LOC~~ SOLN: 40.0000 mg | SUBCUTANEOUS | Status: DC

## 2016-08-22 MED ORDER — GABAPENTIN 400 MG PO CAPS
1200.0000 mg | ORAL_CAPSULE | Freq: Every day | ORAL | Status: DC
  Administered 2016-08-22 – 2016-08-25 (×4): 1200 mg via ORAL
  Filled 2016-08-22 (×4): qty 3

## 2016-08-22 MED ORDER — INSULIN ASPART 100 UNIT/ML ~~LOC~~ SOLN
0.0000 [IU] | Freq: Three times a day (TID) | SUBCUTANEOUS | Status: DC
  Administered 2016-08-23 (×2): 2 [IU] via SUBCUTANEOUS
  Administered 2016-08-24: 3 [IU] via SUBCUTANEOUS

## 2016-08-22 MED ORDER — ROPIVACAINE HCL 5 MG/ML IJ SOLN
INTRAMUSCULAR | Status: AC
  Filled 2016-08-22: qty 30

## 2016-08-22 MED ORDER — GLYCOPYRROLATE 0.2 MG/ML IV SOSY
PREFILLED_SYRINGE | INTRAVENOUS | Status: DC | PRN
  Administered 2016-08-22: .2 mg via INTRAVENOUS

## 2016-08-22 MED ORDER — METFORMIN HCL 500 MG PO TABS
1000.0000 mg | ORAL_TABLET | Freq: Two times a day (BID) | ORAL | Status: DC
  Administered 2016-08-23 – 2016-08-26 (×6): 1000 mg via ORAL
  Filled 2016-08-22 (×7): qty 2

## 2016-08-22 MED ORDER — PROPOFOL 10 MG/ML IV BOLUS
INTRAVENOUS | Status: AC
  Filled 2016-08-22: qty 20

## 2016-08-22 MED ORDER — CEFAZOLIN SODIUM-DEXTROSE 2-4 GM/100ML-% IV SOLN
2.0000 g | Freq: Three times a day (TID) | INTRAVENOUS | Status: AC
  Administered 2016-08-23 (×2): 2 g via INTRAVENOUS
  Filled 2016-08-22: qty 100

## 2016-08-22 MED ORDER — LIDOCAINE HCL (CARDIAC) 20 MG/ML IV SOLN
INTRAVENOUS | Status: DC | PRN
  Administered 2016-08-22: 80 mg via INTRAVENOUS

## 2016-08-22 MED ORDER — MOMETASONE FURO-FORMOTEROL FUM 200-5 MCG/ACT IN AERO: 2.0000 | INHALATION_SPRAY | Freq: Two times a day (BID) | RESPIRATORY_TRACT | Status: DC

## 2016-08-22 MED ORDER — CHLORHEXIDINE GLUCONATE 4 % EX LIQD: 60.0000 mL | Freq: Once | CUTANEOUS | Status: DC

## 2016-08-22 MED ORDER — MOMETASONE FURO-FORMOTEROL FUM 200-5 MCG/ACT IN AERO
2.0000 | INHALATION_SPRAY | Freq: Two times a day (BID) | RESPIRATORY_TRACT | Status: DC
  Administered 2016-08-23 – 2016-08-25 (×6): 2 via RESPIRATORY_TRACT
  Filled 2016-08-22: qty 8.8

## 2016-08-22 MED ORDER — MORPHINE SULFATE (PF) 2 MG/ML IV SOLN
2.0000 mg | INTRAVENOUS | Status: DC | PRN
  Administered 2016-08-22 – 2016-08-23 (×2): 2 mg via INTRAVENOUS
  Filled 2016-08-22 (×2): qty 1

## 2016-08-22 MED ORDER — CEFAZOLIN SODIUM-DEXTROSE 2-4 GM/100ML-% IV SOLN
2.0000 g | INTRAVENOUS | Status: AC
  Administered 2016-08-22: 2 g via INTRAVENOUS
  Filled 2016-08-22: qty 100

## 2016-08-22 MED ORDER — ONDANSETRON HCL 4 MG/2ML IJ SOLN
INTRAMUSCULAR | Status: DC | PRN
  Administered 2016-08-22: 4 mg via INTRAVENOUS

## 2016-08-22 MED ORDER — PANTOPRAZOLE SODIUM 40 MG PO TBEC
40.0000 mg | DELAYED_RELEASE_TABLET | Freq: Every day | ORAL | Status: DC
  Administered 2016-08-23 – 2016-08-26 (×4): 40 mg via ORAL
  Filled 2016-08-22 (×4): qty 1

## 2016-08-22 MED ORDER — KETAMINE HCL 10 MG/ML IJ SOLN
INTRAMUSCULAR | Status: DC | PRN
  Administered 2016-08-22 (×3): 10 mg via INTRAVENOUS

## 2016-08-22 MED ORDER — PRAVASTATIN SODIUM 20 MG PO TABS
40.0000 mg | ORAL_TABLET | Freq: Every day | ORAL | Status: DC
  Administered 2016-08-23 – 2016-08-25 (×3): 40 mg via ORAL
  Filled 2016-08-22 (×3): qty 2

## 2016-08-22 MED ORDER — FENTANYL CITRATE (PF) 100 MCG/2ML IJ SOLN
INTRAMUSCULAR | Status: AC
  Filled 2016-08-22: qty 2

## 2016-08-22 MED ORDER — GLIMEPIRIDE 2 MG PO TABS
2.0000 mg | ORAL_TABLET | Freq: Two times a day (BID) | ORAL | Status: DC
  Administered 2016-08-23 – 2016-08-26 (×6): 2 mg via ORAL
  Filled 2016-08-22 (×8): qty 1

## 2016-08-22 MED ORDER — ONDANSETRON HCL 4 MG/2ML IJ SOLN: 4.0000 mg | Freq: Once | INTRAMUSCULAR | Status: DC | PRN

## 2016-08-22 MED ORDER — GLYCOPYRROLATE 0.2 MG/ML IV SOSY
PREFILLED_SYRINGE | INTRAVENOUS | Status: AC
  Filled 2016-08-22: qty 3

## 2016-08-22 MED ORDER — FENTANYL CITRATE (PF) 100 MCG/2ML IJ SOLN
INTRAMUSCULAR | Status: AC
  Administered 2016-08-22: 50 ug via INTRAVENOUS
  Filled 2016-08-22: qty 2

## 2016-08-22 MED ORDER — METOPROLOL TARTRATE 5 MG/5ML IV SOLN
INTRAVENOUS | Status: AC
  Filled 2016-08-22: qty 5

## 2016-08-22 MED ORDER — PROPOFOL 10 MG/ML IV BOLUS
INTRAVENOUS | Status: DC | PRN
  Administered 2016-08-22: 160 mg via INTRAVENOUS

## 2016-08-22 MED ORDER — DOCUSATE SODIUM 100 MG PO CAPS
100.0000 mg | ORAL_CAPSULE | Freq: Two times a day (BID) | ORAL | Status: DC
  Administered 2016-08-23 – 2016-08-26 (×7): 100 mg via ORAL
  Filled 2016-08-22 (×7): qty 1

## 2016-08-22 MED ORDER — ACETAMINOPHEN 10 MG/ML IV SOLN
INTRAVENOUS | Status: DC | PRN
  Administered 2016-08-22: 1000 mg via INTRAVENOUS

## 2016-08-22 MED ORDER — ALBUTEROL SULFATE (2.5 MG/3ML) 0.083% IN NEBU: 3.0000 mL | INHALATION_SOLUTION | RESPIRATORY_TRACT | Status: DC | PRN

## 2016-08-22 MED ORDER — BUPIVACAINE HCL (PF) 0.5 % IJ SOLN
INTRAMUSCULAR | Status: DC | PRN
  Administered 2016-08-22: 30 mL

## 2016-08-22 MED ORDER — EPHEDRINE 5 MG/ML INJ
INTRAVENOUS | Status: AC
  Filled 2016-08-22: qty 10

## 2016-08-22 MED ORDER — FENTANYL CITRATE (PF) 100 MCG/2ML IJ SOLN
25.0000 ug | INTRAMUSCULAR | Status: DC | PRN
  Administered 2016-08-22 (×3): 50 ug via INTRAVENOUS

## 2016-08-22 MED ORDER — LACTATED RINGERS IV SOLN
INTRAVENOUS | Status: DC | PRN
  Administered 2016-08-22: 17:00:00 via INTRAVENOUS

## 2016-08-22 MED ORDER — ENOXAPARIN SODIUM 40 MG/0.4ML ~~LOC~~ SOLN
40.0000 mg | SUBCUTANEOUS | Status: DC
  Administered 2016-08-23 – 2016-08-26 (×4): 40 mg via SUBCUTANEOUS
  Filled 2016-08-22 (×4): qty 0.4

## 2016-08-22 MED ORDER — ACETAMINOPHEN 10 MG/ML IV SOLN
INTRAVENOUS | Status: AC
  Filled 2016-08-22: qty 100

## 2016-08-22 MED ORDER — FENTANYL CITRATE (PF) 100 MCG/2ML IJ SOLN
INTRAMUSCULAR | Status: DC | PRN
  Administered 2016-08-22 (×2): 50 ug via INTRAVENOUS
  Administered 2016-08-22 (×3): 25 ug via INTRAVENOUS
  Administered 2016-08-22: 50 ug via INTRAVENOUS
  Administered 2016-08-22: 25 ug via INTRAVENOUS
  Administered 2016-08-22: 50 ug via INTRAVENOUS

## 2016-08-22 MED ORDER — CEFAZOLIN SODIUM-DEXTROSE 2-4 GM/100ML-% IV SOLN
INTRAVENOUS | Status: AC
  Filled 2016-08-22: qty 100

## 2016-08-22 MED ORDER — CALCIUM CARBONATE 1500 (600 CA) MG PO TABS
1500.0000 mg | ORAL_TABLET | Freq: Every day | ORAL | Status: DC
  Filled 2016-08-22: qty 1

## 2016-08-22 MED ORDER — METOPROLOL TARTRATE 5 MG/5ML IV SOLN
INTRAVENOUS | Status: DC | PRN
  Administered 2016-08-22: 1 mg via INTRAVENOUS

## 2016-08-22 MED ORDER — KETAMINE HCL 10 MG/ML IJ SOLN
INTRAMUSCULAR | Status: AC
  Filled 2016-08-22: qty 1

## 2016-08-22 MED ORDER — METOCLOPRAMIDE HCL 10 MG PO TABS
10.0000 mg | ORAL_TABLET | Freq: Two times a day (BID) | ORAL | Status: DC
  Administered 2016-08-23 – 2016-08-26 (×7): 10 mg via ORAL
  Filled 2016-08-22 (×7): qty 1

## 2016-08-22 MED ORDER — ACETAMINOPHEN 325 MG PO TABS: 650.0000 mg | ORAL_TABLET | Freq: Four times a day (QID) | ORAL | Status: DC | PRN

## 2016-08-22 MED ORDER — BUPIVACAINE HCL (PF) 0.5 % IJ SOLN
INTRAMUSCULAR | Status: AC
Start: 2016-08-22 — End: 2016-08-22
  Filled 2016-08-22: qty 30

## 2016-08-22 MED ORDER — POLYETHYLENE GLYCOL 3350 17 G PO PACK
17.0000 g | PACK | Freq: Every day | ORAL | Status: DC
  Administered 2016-08-24: 17 g via ORAL
  Filled 2016-08-22 (×3): qty 1

## 2016-08-22 MED ORDER — ACETAMINOPHEN 500 MG PO TABS
1000.0000 mg | ORAL_TABLET | Freq: Four times a day (QID) | ORAL | Status: DC
  Administered 2016-08-22 – 2016-08-25 (×13): 1000 mg via ORAL
  Filled 2016-08-22 (×14): qty 2

## 2016-08-22 MED ORDER — ACETAMINOPHEN 650 MG RE SUPP: 650.0000 mg | Freq: Four times a day (QID) | RECTAL | Status: DC | PRN

## 2016-08-22 MED ORDER — LOSARTAN POTASSIUM 50 MG PO TABS
50.0000 mg | ORAL_TABLET | Freq: Every day | ORAL | Status: DC
  Administered 2016-08-24 – 2016-08-26 (×3): 50 mg via ORAL
  Filled 2016-08-22 (×4): qty 1

## 2016-08-22 MED ORDER — HYDROCHLOROTHIAZIDE 12.5 MG PO CAPS
12.5000 mg | ORAL_CAPSULE | Freq: Every day | ORAL | Status: DC
  Administered 2016-08-24 – 2016-08-26 (×3): 12.5 mg via ORAL
  Filled 2016-08-22 (×4): qty 1

## 2016-08-22 SURGICAL SUPPLY — 56 items
BAG SPEC THK2 15X12 ZIP CLS (MISCELLANEOUS)
BAG ZIPLOCK 12X15 (MISCELLANEOUS) IMPLANT
BANDAGE ACE 6X5 VEL STRL LF (GAUZE/BANDAGES/DRESSINGS) ×3 IMPLANT
BIT DRILL CALIBR QC 2.5X250 (BIT) ×1 IMPLANT
BIT DRILL CALIBR QC 2.8X250 (BIT) ×1 IMPLANT
BIT DRILL CANN 3.2MM (BIT) ×1 IMPLANT
CUFF TOURN SGL QUICK 34 (TOURNIQUET CUFF) ×2
CUFF TRNQT CYL 34X4X40X1 (TOURNIQUET CUFF) ×1 IMPLANT
DRAPE C-ARM 42X120 X-RAY (DRAPES) ×3 IMPLANT
DRAPE C-ARMOR (DRAPES) ×1 IMPLANT
DRAPE U-SHAPE 47X51 STRL (DRAPES) ×2 IMPLANT
DRILL BIT CANN 3.2MM (BIT) ×1
DRSG ADAPTIC 3X8 NADH LF (GAUZE/BANDAGES/DRESSINGS) ×1 IMPLANT
DRSG PAD ABDOMINAL 8X10 ST (GAUZE/BANDAGES/DRESSINGS) ×3 IMPLANT
DURAPREP 26ML APPLICATOR (WOUND CARE) ×2 IMPLANT
ELECT REM PT RETURN 9FT ADLT (ELECTROSURGICAL) ×2
ELECTRODE REM PT RTRN 9FT ADLT (ELECTROSURGICAL) ×1 IMPLANT
GAUZE SPONGE 4X4 12PLY STRL (GAUZE/BANDAGES/DRESSINGS) IMPLANT
GAUZE XEROFORM 1X8 LF (GAUZE/BANDAGES/DRESSINGS) ×1 IMPLANT
GLOVE BIOGEL M 7.0 STRL (GLOVE) IMPLANT
GLOVE BIOGEL PI IND STRL 7.5 (GLOVE) ×1 IMPLANT
GLOVE BIOGEL PI IND STRL 8.5 (GLOVE) ×1 IMPLANT
GLOVE BIOGEL PI INDICATOR 7.5 (GLOVE)
GLOVE BIOGEL PI INDICATOR 8.5 (GLOVE) ×1
GLOVE ECLIPSE 8.0 STRL XLNG CF (GLOVE) IMPLANT
GLOVE ORTHO TXT STRL SZ7.5 (GLOVE) ×10 IMPLANT
GLOVE SURG ORTHO 8.0 STRL STRW (GLOVE) ×1 IMPLANT
GOWN STRL REUS W/TWL LRG LVL3 (GOWN DISPOSABLE) ×3 IMPLANT
GOWN STRL REUS W/TWL XL LVL3 (GOWN DISPOSABLE) IMPLANT
IMMOBILIZER KNEE 16 UNIV (MISCELLANEOUS) ×1 IMPLANT
MANIFOLD NEPTUNE II (INSTRUMENTS) ×2 IMPLANT
NS IRRIG 1000ML POUR BTL (IV SOLUTION) ×2 IMPLANT
PACK TOTAL JOINT (CUSTOM PROCEDURE TRAY) ×2 IMPLANT
PAD CAST 4YDX4 CTTN HI CHSV (CAST SUPPLIES) ×2 IMPLANT
PADDING CAST COTTON 4X4 STRL (CAST SUPPLIES) ×8
PLATE PROX TIBIA 6H LT 3.5 VA (Plate) ×1 IMPLANT
POSITIONER SURGICAL ARM (MISCELLANEOUS) ×2 IMPLANT
SCREW CANN 64MM (Screw) ×1 IMPLANT
SCREW CORTEX 3.5 38MM (Screw) ×1 IMPLANT
SCREW CORTEX 3.5 40MM (Screw) ×2 IMPLANT
SCREW CORTEX 3.5 50MM (Screw) ×2 IMPLANT
SCREW LOCK 3.5X85 (Screw) ×2 IMPLANT
SCREW LOCK CORT ST 3.5X38 (Screw) IMPLANT
SCREW LOCK CORT ST 3.5X40 (Screw) IMPLANT
SCREW LOCKING 3.5X80MM VA (Screw) ×2 IMPLANT
SCREW LOCKING VA 3.5X85MM (Screw) ×2 IMPLANT
SPLINT PLASTER CAST XFAST 5X30 (CAST SUPPLIES) IMPLANT
SPLINT PLASTER XFAST SET 5X30 (CAST SUPPLIES) ×1
STRIP CLOSURE SKIN 1/2X4 (GAUZE/BANDAGES/DRESSINGS) IMPLANT
SUT ETHILON 3 0 PS 1 (SUTURE) ×1 IMPLANT
SUT MNCRL AB 4-0 PS2 18 (SUTURE) ×1 IMPLANT
SUT VIC AB 1 CT1 27 (SUTURE) ×2
SUT VIC AB 1 CT1 27XBRD ANTBC (SUTURE) ×1 IMPLANT
SUT VIC AB 2-0 CT1 27 (SUTURE) ×4
SUT VIC AB 2-0 CT1 TAPERPNT 27 (SUTURE) ×1 IMPLANT
TOWEL OR 17X26 10 PK STRL BLUE (TOWEL DISPOSABLE) ×2 IMPLANT

## 2016-08-22 NOTE — Anesthesia Postprocedure Evaluation (Signed)
Anesthesia Post Note  Patient: Paul [name]  Procedure(s) Performed: Procedure(s) (LRB): OPEN REDUCTION INTERNAL FIXATION (ORIF) TIBIA FRACTURE (Left)  Patient location during evaluation: PACU Anesthesia Type: General Level of consciousness: awake and alert Pain management: pain level controlled Vital Signs Assessment: post-procedure vital signs reviewed and stable Respiratory status: spontaneous breathing, nonlabored ventilation, respiratory function stable and patient connected to nasal cannula oxygen Cardiovascular status: blood pressure returned to baseline and stable Postop Assessment: no signs of nausea or vomiting Anesthetic complications: no    Last Vitals:  Vitals:   08/22/16 2040 08/22/16 2145  BP: (!) 163/68 (!) 141/67  Pulse: 75 77  Resp:  20  Temp: 36.6 C 36.8 C    Last Pain:  Vitals:   08/22/16 2145  TempSrc: Oral  PainSc:                  Thania Woodlief JENNETTE

## 2016-08-22 NOTE — Brief Op Note (Signed)
08/22/2016  7:30 PM  PATIENT:  Paul [name]  75 y.o. male  PRE-OPERATIVE DIAGNOSIS:  LEFT PROXIMAL TUBIA FRACTURE AND ANKLE FRACTURE  POST-OPERATIVE DIAGNOSIS:  left proximal tibial fracture and ankle  PROCEDURE:  Procedure(s): OPEN REDUCTION INTERNAL FIXATION (ORIF) TIBIA FRACTURE (Left)  Open reduction internal fixation of tibial tubercle fracture  SURGEON:  Surgeon(s) and Role:    * Nicholes Stairs, MD - Primary  PHYSICIAN ASSISTANT:   ASSISTANTS: none   ANESTHESIA:   general  EBL:  Total I/O In: 900 [I.V.:900] Out: -   BLOOD ADMINISTERED:none  DRAINS: none   LOCAL MEDICATIONS USED:  MARCAINE     SPECIMEN:  No Specimen  DISPOSITION OF SPECIMEN:  N/A  COUNTS:  YES  TOURNIQUET:  * No tourniquets in log *  DICTATION: .Note written in EPIC  PLAN OF CARE: Admit to inpatient   PATIENT DISPOSITION:  PACU - hemodynamically stable.   Delay start of Pharmacological VTE agent (>24hrs) due to surgical blood loss or risk of bleeding: not applicable

## 2016-08-22 NOTE — Anesthesia Preprocedure Evaluation (Addendum)
Anesthesia Evaluation  Patient identified by MRN, date of birth, ID band Patient awake    Reviewed: Allergy & Precautions, NPO status , Patient's Chart, lab work & pertinent test results  History of Anesthesia Complications Negative for: history of anesthetic complications  Airway Mallampati: II  TM Distance: >3 FB Neck ROM: Full    Dental no notable dental hx. (+) Dental Advisory Given   Pulmonary asthma , COPD, Current Smoker,    Pulmonary exam normal breath sounds clear to auscultation       Cardiovascular hypertension, + CAD  Normal cardiovascular exam Rhythm:Regular Rate:Normal   No T wave inversion was noted during stress.  There was no ST segment deviation noted during stress.  Defect 1: There is a medium defect of moderate severity.  This is a low risk study.  Nuclear stress EF: 65%.   Medium size, moderate intensity fixed inferior attenuation artifact. No reversible ischemia. LVEF 65% with normal wall motion. This is a low risk study.    Neuro/Psych negative neurological ROS  negative psych ROS   GI/Hepatic Neg liver ROS, GERD  ,  Endo/Other  diabetes  Renal/GU   negative genitourinary   Musculoskeletal negative musculoskeletal ROS (+)   Abdominal   Peds negative pediatric ROS (+)  Hematology negative hematology ROS (+)   Anesthesia Other Findings   Reproductive/Obstetrics negative OB ROS                             Anesthesia Physical Anesthesia Plan  ASA: III  Anesthesia Plan: General   Post-op Pain Management:    Induction: Intravenous  Airway Management Planned: LMA  Additional Equipment:   Intra-op Plan:   Post-operative Plan: Extubation in OR  Informed Consent: I have reviewed the patients History and Physical, chart, labs and discussed the procedure including the risks, benefits and alternatives for the proposed anesthesia with the patient or  authorized representative who has indicated his/her understanding and acceptance.   Dental advisory given  Plan Discussed with: CRNA  Anesthesia Plan Comments:        Anesthesia Quick Evaluation

## 2016-08-22 NOTE — H&P (Signed)
ORTHOPAEDIC H and P  REQUESTING PHYSICIAN: Nicholes Stairs, MD  PCP:  Marjorie Smolder, MD  Chief Complaint: Left tibia and ankle fracture  HPI: Paul [name] is a 75 y.o. male who complains of  Left leg fractures.  He has been in a rehab facility for the last 2 weeks to allow subsidence of swelling.  He has poorly controlled diabetes and we counseled him on his risk of operating too soon.  He presents today for operative fixation.  No new complaints.  Past Medical History:  Diagnosis Date  . Asthma   . Atrial fibrillation ([place])   . CAD (coronary artery disease)    Prior PTCA  . COPD (chronic obstructive pulmonary disease) ([place])   . Diabetes mellitus   . GERD (gastroesophageal reflux disease)   . Hyperlipidemia   . Hypertension   . lung ca dx'd 01/2006  . Nephrolithiasis   . Osteopenia   . Pulmonary embolus ([place])   . Tremor, essential    Past Surgical History:  Procedure Laterality Date  . Angioplasty  Approx 1993  . CARDIAC CATHETERIZATION  Approximately 1993  . CHOLECYSTECTOMY    . 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 FIXATION KYPHOPLASTY    . LUNG CANCER SURGERY  01/2006   Small excision of left lung tissue; mesh with radioactive seeds (per pt report)  . ROTATOR CUFF TEAR  2016  . TONSILLECTOMY     Social History   Social History  . Marital status: Married    Spouse name: Santiago Glad  . Number of children: 2  . Years of education: college   Occupational History  .  Retired    Retired from [employer] and [employer]  . Smoking status: Current Every Day Smoker    Packs/day: 0.50    Years: 59.00    Types: Cigarettes  . Smokeless tobacco: Never Used  . Alcohol use 0.6 oz/week    1 Glasses of wine per week     Comment: Rare  . Drug use: No  . Sexual activity: No   Other Topics Concern  . None   Social History Narrative   Patient lives at home with his wife Santiago Glad).   Retired.   Education- College    Right handed.   Caffeine- three  cups of coffee daily.   Family History  Problem Relation Age of Onset  . Heart disease Mother   . Tremor Mother   . Dementia Mother   . Heart Problems Father    Allergies  Allergen Reactions  . Codeine Anaphylaxis  . Phenobarbital Hypertension   Prior to Admission medications   Medication Sig Start Date End Date Taking? Authorizing Provider  acetaminophen (TYLENOL) 500 MG tablet Take 1,000 mg by mouth daily with breakfast.   Yes Historical Provider, MD  albuterol (PROAIR HFA) 108 (90 BASE) MCG/ACT inhaler Inhale 2 puffs into the lungs every 2 (two) hours as needed for wheezing or shortness of breath.    Yes Historical Provider, MD  budesonide-formoterol (SYMBICORT) 160-4.5 MCG/ACT inhaler Take 2 puffs first thing in am and then another 2 puffs about 12 hours later. Patient taking differently: Inhale 2 puffs into the lungs 2 (two) times daily.  01/26/16  Yes Tanda Rockers, MD  calcium carbonate (OS-CAL) 600 MG TABS tablet Take 600 mg by mouth daily with supper.    Yes Historical Provider, MD  docusate sodium (COLACE) 100 MG capsule Take 1 capsule (100 mg total) by  mouth 2 (two) times daily. HOLD for loose stool or diarrhea 08/11/16  Yes Arlee Muslim, PA-C  enoxaparin (LOVENOX) 40 MG/0.4ML injection Inject 0.4 mLs (40 mg total) into the skin daily. 08/11/16  Yes Arlee Muslim, PA-C  fexofenadine (ALLEGRA) 180 MG tablet Take 180 mg by mouth daily with breakfast.    Yes Historical Provider, MD  fluticasone (FLONASE) 50 MCG/ACT nasal spray Place 1 spray into the nose 2 (two) times daily.    Yes Historical Provider, MD  Fluticasone-Salmeterol (ADVAIR) 250-50 MCG/DOSE AEPB Inhale 1 puff into the lungs 2 (two) times daily.    Yes Historical Provider, MD  gabapentin (NEURONTIN) 300 MG capsule Take 6 capsules by mouth at bedtime Patient taking differently: Take 1,200 mg by mouth at bedtime.  10/25/15  Yes Dennie Bible, NP  glimepiride (AMARYL) 2 MG tablet Take 2 mg by mouth 2 (two) times  daily.  04/28/16  Yes Historical Provider, MD  guaiFENesin (MUCINEX) 600 MG 12 hr tablet Take 1,200 mg by mouth daily with breakfast.    Yes Historical Provider, MD  hydrochlorothiazide (MICROZIDE) 12.5 MG capsule Take 12.5 mg by mouth daily.   Yes Historical Provider, MD  losartan (COZAAR) 50 MG tablet Take 1 tablet (50 mg total) by mouth daily. Patient taking differently: Take 50 mg by mouth daily with breakfast.  10/21/13  Yes Tanda Rockers, MD  metFORMIN (GLUCOPHAGE) 1000 MG tablet Take 1,000 mg by mouth 2 (two) times daily with a meal.     Yes Historical Provider, MD  methocarbamol (ROBAXIN) 500 MG tablet Take 1 tablet (500 mg total) by mouth every 6 (six) hours as needed for muscle spasms. 08/11/16  Yes Arlee Muslim, PA-C  metoCLOPramide (REGLAN) 10 MG tablet Take 10 mg by mouth 2 (two) times daily.   Yes Historical Provider, MD  Multiple Vitamins-Minerals (CENTRUM SILVER ADULT 50+) TABS Take 1 tablet by mouth daily with breakfast.   Yes Historical Provider, MD  oxyCODONE (OXY IR/ROXICODONE) 5 MG immediate release tablet Take 1-2 tablets (5-10 mg total) by mouth every 3 (three) hours as needed for moderate pain, severe pain or breakthrough pain. 08/11/16  Yes Arlee Muslim, PA-C  OXYGEN Place 2 L into the nose at bedtime. 2L of oxygen at night and 2L of oxygen when walking long distances   Yes Historical Provider, MD  pantoprazole (PROTONIX) 40 MG tablet Take 1 tablet (40 mg total) by mouth daily. 08/11/16  Yes Arlee Muslim, PA-C  pioglitazone (ACTOS) 45 MG tablet Take 45 mg by mouth daily with breakfast.    Yes Historical Provider, MD  polyethylene glycol (MIRALAX / GLYCOLAX) packet Take 17 g by mouth daily.   Yes Historical Provider, MD  pravastatin (PRAVACHOL) 40 MG tablet Take 1 tablet (40 mg total) by mouth daily. Patient taking differently: Take 40 mg by mouth at bedtime.  09/13/14  Yes Lelon Perla, MD  Vitamin D, Ergocalciferol, (DRISDOL) 50000 UNITS CAPS capsule Take 50,000 Units by  mouth every Saturday.    Yes Historical Provider, MD   No results found.  Positive ROS: All other systems have been reviewed and were otherwise negative with the exception of those mentioned in the HPI and as above.  Physical Exam: General: Alert, no acute distress Cardiovascular: No pedal edema Respiratory: No cyanosis, no use of accessory musculature GI: No organomegaly, abdomen is soft and non-tender Skin: No lesions in the area of chief complaint Neurologic: Sensation intact distally Psychiatric: Patient is competent for consent with normal mood and affect  Lymphatic: No axillary or cervical lymphadenopathy  MUSCULOSKELETAL:  LLE-  Splint in place, some ecchymosis noted proximal on the thigh adjacent to tight Ace wrap, otherwise no pain with passive toe stretch.  +SILT on toe.  Assessment: Left proximal tibia fracture with left ankle fracture  Plan: -operative fixation today -ankle will be stressed intraop, to determine need for ORIF -will admit post op to me for inpatient PT and dc planning -The risks, benefits, and alternatives were discussed with the patient. There are risks associated with the surgery including, but not limited to, problems with anesthesia (death), infection, differences in leg length/angulation/rotation, fracture of bones, loosening or failure of implants, malunion, nonunion, hematoma (blood accumulation) which may require surgical drainage, blood clots, pulmonary embolism, nerve injury (foot drop), and blood vessel injury. The patient understands these risks and elects to proceed.    Nicholes Stairs, MD Cell [phone]    08/22/2016 4:52 PM

## 2016-08-22 NOTE — Anesthesia Postprocedure Evaluation (Signed)
Anesthesia Post Note  Patient: Paul [name]  Procedure(s) Performed: Procedure(s) (LRB): OPEN REDUCTION INTERNAL FIXATION (ORIF) TIBIA FRACTURE (Left)  Anesthesia Post Evaluation  Last Vitals:  Vitals:   08/22/16 2040 08/22/16 2145  BP: (!) 163/68 (!) 141/67  Pulse: 75 77  Resp:  20  Temp: 36.6 C 36.8 C    Last Pain:  Vitals:   08/22/16 2145  TempSrc: Oral  PainSc:                  Paul [name]

## 2016-08-22 NOTE — Transfer of Care (Signed)
Immediate Anesthesia Transfer of Care Note  Patient: Paul [name]  Procedure(s) Performed: Procedure(s): OPEN REDUCTION INTERNAL FIXATION (ORIF) TIBIA FRACTURE (Left)  Patient Location: PACU  Anesthesia Type:General  Level of Consciousness:  sedated, patient cooperative and responds to stimulation  Airway & Oxygen Therapy:Patient Spontanous Breathing and Patient connected to face mask oxgen  Post-op Assessment:  Report given to PACU RN and Post -op Vital signs reviewed and stable  Post vital signs:  Reviewed and stable  Last Vitals:  Vitals:   08/22/16 1442  BP: 133/63  Pulse: 70  Resp: 18  Temp: 01.7 C    Complications: No apparent anesthesia complications

## 2016-08-23 ENCOUNTER — Encounter (HOSPITAL_COMMUNITY): Payer: Self-pay | Admitting: Orthopedic Surgery

## 2016-08-23 LAB — CBC
HCT: 34.7 % — ABNORMAL LOW (ref 39.0–52.0)
Hemoglobin: 10.9 g/dL — ABNORMAL LOW (ref 13.0–17.0)
MCH: 26.5 pg (ref 26.0–34.0)
MCHC: 31.4 g/dL (ref 30.0–36.0)
MCV: 84.2 fL (ref 78.0–100.0)
Platelets: 299 10*3/uL (ref 150–400)
RBC: 4.12 MIL/uL — ABNORMAL LOW (ref 4.22–5.81)
RDW: 15.1 % (ref 11.5–15.5)
WBC: 9.2 10*3/uL (ref 4.0–10.5)

## 2016-08-23 LAB — GLUCOSE, CAPILLARY
Glucose-Capillary: 133 mg/dL — ABNORMAL HIGH (ref 65–99)
Glucose-Capillary: 146 mg/dL — ABNORMAL HIGH (ref 65–99)
Glucose-Capillary: 58 mg/dL — ABNORMAL LOW (ref 65–99)
Glucose-Capillary: 116 mg/dL — ABNORMAL HIGH (ref 65–99)
Glucose-Capillary: 150 mg/dL — ABNORMAL HIGH (ref 65–99)

## 2016-08-23 MED ORDER — CALCIUM CARBONATE 1250 (500 CA) MG PO TABS
1.0000 | ORAL_TABLET | Freq: Every day | ORAL | Status: DC
  Administered 2016-08-23 – 2016-08-25 (×3): 500 mg via ORAL
  Filled 2016-08-23 (×3): qty 1

## 2016-08-23 NOTE — Evaluation (Addendum)
Occupational Therapy Evaluation Patient Details Name: Paul [name] MRN: [id number] DOB: [birth year] Today's Date: 08/23/2016    History of Present Illness 75 y.o. male with hx of CAD, COPD, paroxysmal afib not on blood thinners and presented to ED with persistent L leg pain. He had a mechanical fall  and sustained Left Tibial Metaphysis Fracture with distal fibula fracture    Clinical Impression   Pt was admitted for the above.  He was at [facility] prior to sx and needed assistance for LB adls and transfers to commode.  He will benefit from continued OT in acute and follow up OT at SNF to maximize independence with adls. Pt currently needs min A +2 for safety with transfers. Goals are for min guard level for this, independence with UE strengthening program and min A for bed mobility in preparation for adls    Follow Up Recommendations  SNF    Equipment Recommendations  3 in 1 bedside comode (wide)    Recommendations for Other Services       Precautions / Restrictions Precautions Precautions: Fall Restrictions LLE Weight Bearing: Non weight bearing      Mobility Bed Mobility           Sit to supine: Mod assist;+2 for physical assistance   General bed mobility comments: assist for LEs and assist to scoot forward on bed  Transfers                 General transfer comment: modified SPT using chair arm, min A +2 for safety    Balance                                            ADL Overall ADL's : Needs assistance/impaired     Grooming: Set up;Sitting   Upper Body Bathing: Set up;Sitting   Lower Body Bathing: Moderate assistance;Sit to/from stand   Upper Body Dressing : Set up;Sitting   Lower Body Dressing: Maximal assistance;Sit to/from stand   Toilet Transfer: Minimal assistance;+2 for safety/equipment;Stand-pivot (to chair; modified SPT holding armrest of chair)   Toileting- Clothing Manipulation and Hygiene: Moderate  assistance;Sit to/from stand         General ADL Comments: pt cannot lift LLE for adls.  He has been working with 3 lb bar at SNF; provided level 4 theraband for RUE and level 2 for LUE     Vision     Perception     Praxis      Pertinent Vitals/Pain  Faces 6 Repositioned, premedicated, worked withiin tolerance     Hand Dominance     Extremity/Trunk Assessment Upper Extremity Assessment Upper Extremity Assessment: LUE deficits/detail LUE Deficits / Details: strength grossly 4-/5; RUE WFLs           Communication Communication Communication: No difficulties   Cognition Arousal/Alertness: Awake/alert Behavior During Therapy: WFL for tasks assessed/performed Overall Cognitive Status: Within Functional Limits for tasks assessed                     General Comments       Exercises Exercises: Other exercises Other Exercises Other Exercises: educated on theraband HEP   Shoulder Instructions      Home Living Family/patient expects to be discharged to:: Skilled nursing facility  Additional Comments: wears O2 at night.  was at [facility] after fall and plans to return      Prior Functioning/Environment Level of Independence: Independent                 OT Problem List: Decreased strength;Decreased activity tolerance;Pain;Decreased knowledge of use of DME or AE   OT Treatment/Interventions: Self-care/ADL training;DME and/or AE instruction;Patient/family education;Therapeutic activities    OT Goals(Current goals can be found in the care plan section) Acute Rehab OT Goals Patient Stated Goal: get back to rehab then home  OT Goal Formulation: With patient Time For Goal Achievement: 08/30/16 Potential to Achieve Goals: Good ADL Goals Pt Will Transfer to Toilet: with min guard assist;bedside commode;stand pivot transfer Pt Will Perform Toileting - Clothing Manipulation and hygiene: with min guard  assist;sit to/from stand Additional ADL Goal #1: pt will be independent with theraband HEP Additional ADL Goal #2: pt will perform bed mobility at min A level in preparation for transfer to 3:1  OT Frequency: Min 2X/week   Barriers to D/C:            Co-evaluation              End of Session    Activity Tolerance: Patient tolerated treatment well Patient left: in chair;with call bell/phone within reach;with family/visitor present   Time: 2353-6144 OT Time Calculation (min): 21 min Charges:  OT General Charges $OT Visit: 1 Procedure OT Evaluation $OT Eval Low Complexity: 1 Procedure G-Codes:    Zafira Munos 2016/09/11, 12:49 PM  Lesle Chris, OTR/L [phone] 09-11-16

## 2016-08-23 NOTE — Care Management Note (Signed)
Case Management Note  Patient Details  Name: Paul [name] MRN: [id number] Date of Birth: October 03, 1941  Subjective/Objective:                  OPEN REDUCTION INTERNAL FIXATION (ORIF) TIBIA FRACTURE (Left) Action/Plan: Discharge planning Expected Discharge Date:  08/24/16               Expected Discharge Plan:  [place]  In-House Referral:     Discharge planning Services  CM Consult  Post Acute Care Choice:    Choice offered to:     DME Arranged:    DME Agency:     HH Arranged:    [place] Agency:     Status of Service:  Completed, signed off  If discussed at [employer] of Stay Meetings, dates discussed:    Additional Comments: CM notes pt to go to SNF post hospitalization; CSW aware and arranging.  No other CM needs were communicated. Dellie Catholic, RN 08/23/2016, 1:48 PM

## 2016-08-23 NOTE — Progress Notes (Signed)
Physical Therapy Treatment Patient Details Name: Paul [name] MRN: [id number] DOB: 1941-09-03 Today's Date: 08/23/2016    History of Present Illness 75 y.o. male with hx of CAD, COPD, paroxysmal afib not on blood thinners and presented to ED with persistent L leg pain. He had a mechanical fall yesterday and sustained Left Tibial Metaphysis Fracture with distal fibula fracture     PT Comments    Pt continues motivated but with mobility progressing slowly 2* pt obesity and NWB status L LE.  Follow Up Recommendations  SNF;Supervision/Assistance - 24 hour     Equipment Recommendations  None recommended by PT    Recommendations for Other Services OT consult     Precautions / Restrictions Precautions Precautions: Fall Required Braces or Orthoses: Knee Immobilizer - Left Knee Immobilizer - Left: On when out of bed or walking Restrictions Weight Bearing Restrictions: Yes LLE Weight Bearing: Non weight bearing    Mobility  Bed Mobility Overal bed mobility: Needs Assistance Bed Mobility: Sit to Supine     Supine to sit: Mod assist;+2 for physical assistance;+2 for safety/equipment Sit to supine: Mod assist;+2 for physical assistance   General bed mobility comments: Assist for LEs and to control trunk  Transfers Overall transfer level: Needs assistance Equipment used: Rolling walker (2 wheeled) Transfers: Sit to/from Stand Sit to Stand: Min assist;Mod assist;+2 physical assistance;+2 safety/equipment Stand pivot transfers: Min assist;+2 physical assistance;+2 safety/equipment       General transfer comment: Stand/pvt with RW and cues for posture and position from RW.  Recliner>BSC>Bed  Ambulation/Gait             General Gait Details: NT - pt unable to sufficiently WB on UEs to allow NWB on L LE and step with R LE   Stairs            Wheelchair Mobility    Modified Rankin (Stroke Patients Only)       Balance Overall balance assessment: Needs  assistance Sitting-balance support: No upper extremity supported Sitting balance-Leahy Scale: Good     Standing balance support: Bilateral upper extremity supported Standing balance-Leahy Scale: Poor                      Cognition Arousal/Alertness: Awake/alert Behavior During Therapy: WFL for tasks assessed/performed Overall Cognitive Status: Within Functional Limits for tasks assessed                      Exercises Other Exercises Other Exercises: educated on and pt return demonstrated theraband for FF, elbow flexion and elbow extension bilaterally. Written program provided.  Level 2 for LUE, Level 3 for RUE.  Pt states R rotator cuff is injured. Exercises do not bother him, and do not involve any abduction nor rotation    General Comments        Pertinent Vitals/Pain Pain Assessment: Faces Faces Pain Scale: Hurts little more Pain Location: LLE Pain Descriptors / Indicators: Aching;Sore Pain Intervention(s): Limited activity within patient's tolerance;Monitored during session;Premedicated before session;Ice applied    Home Living Family/patient expects to be discharged to:: Skilled nursing facility Living Arrangements: Spouse/significant other Available Help at Discharge: Family Type of Home: House Home Access: Stairs to enter Entrance Stairs-Rails: Right;Left;Can reach both Home Layout: Able to live on main level with bedroom/bathroom Home Equipment: Walker - 2 wheels;Bedside commode Additional Comments: wears O2 at night.  was at [facility] after fall and plans to return    Prior Function Level of Independence: Independent  PT Goals (current goals can now be found in the care plan section) Acute Rehab PT Goals Patient Stated Goal: get back to rehab then home  PT Goal Formulation: With patient Time For Goal Achievement: 08/17/16 Potential to Achieve Goals: Good Progress towards PT goals: Progressing toward goals    Frequency    Min  3X/week      PT Plan Current plan remains appropriate    Co-evaluation PT/OT/SLP Co-Evaluation/Treatment: Yes Reason for Co-Treatment: For patient/therapist safety PT goals addressed during session: Mobility/safety with mobility OT goals addressed during session: ADL's and self-care     End of Session Equipment Utilized During Treatment: Gait belt Activity Tolerance: Patient tolerated treatment well;Patient limited by fatigue Patient left: in bed;with call bell/phone within reach     Time: 1512-1535 PT Time Calculation (min) (ACUTE ONLY): 23 min  Charges:  $Therapeutic Activity: 8-22 mins                    G Codes:      Michiko Lineman 2016/09/12, 5:32 PM

## 2016-08-23 NOTE — Progress Notes (Signed)
   08/23/16 1400  OT Visit Information  Last OT Received On 08/23/16  Assistance Needed +2  PT/OT/SLP Co-Evaluation/Treatment Yes  Reason for Co-Treatment For patient/therapist safety  PT goals addressed during session Mobility/safety with mobility  OT goals addressed during session ADL's and self-care;Strengthening/ROM  History of Present Illness 75 y.o. male with hx of CAD, COPD, paroxysmal afib not on blood thinners and presented to ED with persistent L leg pain. He had a mechanical fall yesterday and sustained Left Tibial Metaphysis Fracture with distal fibula fracture   Precautions  Precautions Fall  Pain Assessment  Pain Assessment Faces  Faces Pain Scale 4  Pain Location LLE  Pain Descriptors / Indicators Sore  Pain Intervention(s) Limited activity within patient's tolerance;Monitored during session;Repositioned  Cognition  Arousal/Alertness Awake/alert  Behavior During Therapy WFL for tasks assessed/performed  Overall Cognitive Status Within Functional Limits for tasks assessed  ADL  Toilet Transfer Minimal assistance;+2 for physical assistance;BSC;RW  Toileting- Clothing Manipulation and Hygiene Total assistance;Sit to/from stand  General ADL Comments pt needed to use bathroom; assisted to 3:1, with hygiene and back to bed.  when using RW, pt moved RLE back posterior to 3:1 seat; had to move 3:1 back a couple of times for safety before he could sit.    Other Exercises  Other Exercises educated on and pt return demonstrated theraband for FF, elbow flexion and elbow extension bilaterally. Written program provided.  Level 2 for LUE, Level 3 for RUE.  Pt states R rotator cuff is injured. Exercises do not bother him, and do not involve any abduction nor rotation  OT - End of Session  Activity Tolerance Patient tolerated treatment well  Patient left in bed;in CPM;with bed alarm set  OT Assessment/Plan  Follow Up Recommendations SNF  OT Equipment (pt states he has a 3:1 at home.  Will need w/c)  OT Goal Progression  Progress towards OT goals Progressing toward goals  OT Time Calculation  OT Start Time (ACUTE ONLY) 1404  OT Stop Time (ACUTE ONLY) 1434  OT Time Calculation (min) 30 min  OT General Charges  $OT Visit 1 Procedure  OT Treatments  $Self Care/Home Management  8-22 mins  Lesle Chris, OTR/L [phone] 08/23/2016

## 2016-08-23 NOTE — Evaluation (Signed)
Physical Therapy Evaluation Patient Details Name: Paul [name] MRN: [id number] DOB: [birth year] Today's Date: 08/23/2016   History of Present Illness  75 y.o. male with hx of CAD, COPD, paroxysmal afib not on blood thinners and presented to ED with persistent L leg pain. He had a mechanical fall yesterday and sustained Left Tibial Metaphysis Fracture with distal fibula fracture   Clinical Impression  Pt admitted as above and presenting with functional mobility limitations 2* post op pain, NWB status on L LE and obesity.  Pt would greatly benefit from follow up rehab at SNF level to maximize IND and safety prior to return home with ltd assist.    Follow Up Recommendations SNF;Supervision/Assistance - 24 hour    Equipment Recommendations  None recommended by PT    Recommendations for Other Services OT consult     Precautions / Restrictions Precautions Precautions: Fall Required Braces or Orthoses: Knee Immobilizer - Left Knee Immobilizer - Left: On when out of bed or walking Restrictions Weight Bearing Restrictions: Yes LLE Weight Bearing: Non weight bearing      Mobility  Bed Mobility Overal bed mobility: Needs Assistance Bed Mobility: Supine to Sit     Supine to sit: Mod assist;+2 for physical assistance;+2 for safety/equipment     General bed mobility comments: assist for LEs and assist to scoot forward on bed  Transfers Overall transfer level: Needs assistance       Stand pivot transfers: Min assist;+2 physical assistance;+2 safety/equipment       General transfer comment: modified SPT using chair arm, min A +2 for safety  Ambulation/Gait             General Gait Details: NT - pt unable to sufficiently WB on UEs to allow NWB on L LE and step with R LE  Stairs            Wheelchair Mobility    Modified Rankin (Stroke Patients Only)       Balance Overall balance assessment: Needs assistance Sitting-balance support: No upper extremity  supported Sitting balance-Leahy Scale: Good                                       Pertinent Vitals/Pain Pain Assessment: Faces Faces Pain Scale: Hurts little more Pain Location: LLE Pain Descriptors / Indicators: Sore Pain Intervention(s): Limited activity within patient's tolerance;Monitored during session;Repositioned    Home Living Family/patient expects to be discharged to:: Skilled nursing facility Living Arrangements: Spouse/significant other Available Help at Discharge: Family Type of Home: House Home Access: Stairs to enter Entrance Stairs-Rails: Right;Left;Can reach both Entrance Stairs-Number of Steps: 1 and 1 Home Layout: Able to live on main level with bedroom/bathroom Home Equipment: Walker - 2 wheels;Bedside commode Additional Comments: wears O2 at night.  was at [facility] after fall and plans to return    Prior Function Level of Independence: Independent               Hand Dominance        Extremity/Trunk Assessment   Upper Extremity Assessment: Defer to OT evaluation       LUE Deficits / Details: strength grossly 4-/5; RUE WFLs   Lower Extremity Assessment: LLE deficits/detail   LLE Deficits / Details: L LE not tested, lower leg splinted, NWB  Cervical / Trunk Assessment: Kyphotic  Communication   Communication: No difficulties  Cognition Arousal/Alertness: Awake/alert Behavior During Therapy: WFL for tasks  assessed/performed Overall Cognitive Status: Within Functional Limits for tasks assessed                      General Comments      Exercises Other Exercises Other Exercises: educated on and pt return demonstrated theraband for FF, elbow flexion and elbow extension bilaterally. Written program provided.  Level 2 for LUE, Level 3 for RUE.  Pt states R rotator cuff is injured. Exercises do not bother him, and do not involve any abduction nor rotation   Assessment/Plan    PT Assessment Patient needs continued  PT services  PT Problem List Decreased strength;Decreased range of motion;Decreased activity tolerance;Decreased balance;Decreased mobility;Decreased knowledge of use of DME;Decreased safety awareness;Obesity;Pain          PT Treatment Interventions DME instruction;Functional mobility training;Wheelchair mobility training;Patient/family education;Therapeutic activities;Therapeutic exercise;Balance training    PT Goals (Current goals can be found in the Care Plan section)  Acute Rehab PT Goals Patient Stated Goal: get back to rehab then home  PT Goal Formulation: With patient Time For Goal Achievement: 08/17/16 Potential to Achieve Goals: Good    Frequency Min 3X/week   Barriers to discharge        Co-evaluation PT/OT/SLP Co-Evaluation/Treatment: Yes Reason for Co-Treatment: For patient/therapist safety PT goals addressed during session: Mobility/safety with mobility OT goals addressed during session: ADL's and self-care       End of Session Equipment Utilized During Treatment: Gait belt Activity Tolerance: Patient tolerated treatment well;Patient limited by fatigue Patient left: in chair;with call bell/phone within reach;with family/visitor present Nurse Communication: Mobility status         Time: 7185-5015 PT Time Calculation (min) (ACUTE ONLY): 28 min   Charges:   PT Evaluation $PT Eval Low Complexity: 1 Procedure     PT G Codes:        Aric Jost 09-16-16, 5:21 PM

## 2016-08-23 NOTE — Progress Notes (Signed)
   Subjective:  Patient reports pain as mild.  Well controlled with pain meds.  He is comfortable in chair.  Objective:   VITALS:   Vitals:   08/23/16 1440 08/23/16 1922 08/23/16 2159 08/23/16 2200  BP: (!) 109/49  (!) 137/57   Pulse: 76  81   Resp: 18  18   Temp: 98.8 F (37.1 C)  98.1 F (36.7 C)   TempSrc: Oral  Oral   SpO2: 91% 95% (!) 89% 93%  Weight:      Height:        Neurologically intact Neurovascular intact Sensation intact distally No cellulitis present moves toes   Lab Results  Component Value Date   WBC 9.2 08/23/2016   HGB 10.9 (L) 08/23/2016   HCT 34.7 (L) 08/23/2016   MCV 84.2 08/23/2016   PLT 299 08/23/2016   BMET    Component Value Date/Time   NA 139 08/10/2016 0443   NA 140 01/13/2014 0922   K 3.5 08/10/2016 0443   K 4.6 01/13/2014 0922   CL 106 08/10/2016 0443   CL 105 01/13/2013 0856   CO2 26 08/10/2016 0443   CO2 26 01/13/2014 0922   GLUCOSE 129 (H) 08/10/2016 0443   GLUCOSE 112 01/13/2014 0922   GLUCOSE 174 (H) 01/13/2013 0856   BUN 13 08/10/2016 0443   BUN 20.1 01/13/2014 0922   CREATININE 0.58 (L) 08/10/2016 0443   CREATININE 0.9 01/13/2014 0922   CALCIUM 8.6 (L) 08/10/2016 0443   CALCIUM 9.9 01/13/2014 0922   GFRNONAA >60 08/10/2016 0443   GFRAA >60 08/10/2016 0443     Assessment/Plan: 1 Day Post-Op   Active Problems:   Tibial fracture   Closed fracture of proximal end of left tibia   Plan- -NWB LLE -keep dressing for now on knee, will change POD 2 - plan to maintain splint on ankle for 2 weeks -PT/OT for dc planning, looking like SNF. Appreciate SW help with placement -lovenox for dvt ppx -SS insulin with home meds for DM -Pulm toilet  -pain control -Diabetic diet   Nicholes Stairs 08/23/2016, 11:57 PM   Geralynn Rile, MD [phone]

## 2016-08-24 DIAGNOSIS — S82892A Other fracture of left lower leg, initial encounter for closed fracture: Secondary | ICD-10-CM | POA: Diagnosis present

## 2016-08-24 LAB — GLUCOSE, CAPILLARY
Glucose-Capillary: 79 mg/dL (ref 65–99)
Glucose-Capillary: 170 mg/dL — ABNORMAL HIGH (ref 65–99)
Glucose-Capillary: 51 mg/dL — ABNORMAL LOW (ref 65–99)
Glucose-Capillary: 65 mg/dL (ref 65–99)
Glucose-Capillary: 91 mg/dL (ref 65–99)
Glucose-Capillary: 107 mg/dL — ABNORMAL HIGH (ref 65–99)

## 2016-08-24 MED ORDER — OXYCODONE HCL 5 MG PO TABS: 5.0000 mg | ORAL_TABLET | ORAL | 0 refills | Status: DC | PRN

## 2016-08-24 MED ORDER — ENOXAPARIN SODIUM 40 MG/0.4ML ~~LOC~~ SOLN: 40.0000 mg | SUBCUTANEOUS | 0 refills | Status: DC

## 2016-08-24 NOTE — NC FL2 (Signed)
[place] LEVEL OF CARE SCREENING TOOL     IDENTIFICATION  Patient Name: Paul [name] Birthdate: [birth year] Sex: male Admission Date (Current Location): 08/22/2016  [facility] and [place] Number:  Herbalist and Address:  [facility],  [place], [place]      Provider Number: [id number]  Attending Physician Name and Address:  Nicholes Stairs, MD  Relative Name and Phone Number:       Current Level of Care: Hospital Recommended Level of Care: [place] Prior Approval Number:    Date Approved/Denied:   PASRR Number: [id number]  Discharge Plan: SNF    Current Diagnoses: Patient Active Problem List   Diagnosis Date Noted  . Closed fracture of proximal end of left tibia 08/22/2016  . Tibial fracture 08/09/2016  . Tibia fracture 08/09/2016  . COPD (chronic obstructive pulmonary disease) ([place])   . Chronic respiratory failure with hypoxia ([place]) 09/11/2015  . Morbid obesity ([place]) 09/11/2015  . Chronic respiratory failure ([place]) 03/07/2015  . CAFL (chronic airflow limitation) ([place]) 10/27/2014  . Diabetes mellitus, type 2 ([place]) 10/27/2014  . Diabetes mellitus type 2, uncontrolled ([place]) 10/27/2014  . Acid reflux 10/27/2014  . Benign essential HTN 10/27/2014  . History of primary bronchial cancer 10/27/2014  . Extreme obesity ([place]) 10/27/2014  . OP (osteoporosis) 10/27/2014  . Peripheral neuralgia 10/27/2014  . Hypercholesterolemia without hypertriglyceridemia 10/27/2014  . Compulsive tobacco user syndrome 10/27/2014  . Cough 06/21/2014  . Bronchitis 06/10/2014  . CAD (coronary artery disease) 04/08/2012  . Hyperlipidemia 04/08/2012  . Hypertension 04/08/2012  . Cigarette smoker 04/08/2012  . AF (paroxysmal atrial fibrillation) ([place]) 03/27/2012  . Exercise hypoxemia 03/25/2012  . COPD GOLD II/ still smoking  03/25/2012  . DM (diabetes mellitus), type 2, uncontrolled with complications ([place])  21/19/4174  . Malignant neoplasm of bronchus and lung, unspecified site 10/11/2011    Orientation RESPIRATION BLADDER Height & Weight     Self, Time, Situation, Place  Normal Incontinent Weight: 239 lb 12.8 oz (108.8 kg) (weight taken at [facility] this AM) Height:  _0  (170.2 cm)  BEHAVIORAL SYMPTOMS/MOOD NEUROLOGICAL BOWEL NUTRITION STATUS      Incontinent Diet (Carb modified)  AMBULATORY STATUS COMMUNICATION OF NEEDS Skin   Supervision Verbally Surgical wounds                       Personal Care Assistance Level of Assistance  Bathing, Feeding, Dressing Bathing Assistance: Limited assistance Feeding assistance: Independent Dressing Assistance: Limited assistance     Functional Limitations Info  Sight Sight Info: Impaired Hearing Info: Adequate Speech Info: Adequate    SPECIAL CARE FACTORS FREQUENCY  PT (By licensed PT)                    Contractures      Additional Factors Info  Allergies Code Status Info: Full Allergies Info: Codeine, Phenobarbital           Current Medications (08/24/2016):  This is the current hospital active medication list Current Facility-Administered Medications  Medication Dose Route Frequency Provider Last Rate Last Dose  . acetaminophen (TYLENOL) tablet 650 mg  650 mg Oral Q6H PRN Nicholes Stairs, MD       Or  . acetaminophen (TYLENOL) suppository 650 mg  650 mg Rectal Q6H PRN Nicholes Stairs, MD      . acetaminophen (TYLENOL) tablet 1,000 mg  1,000 mg Oral QID Nicholes Stairs, MD  1,000 mg at 08/24/16 1202  . albuterol (PROVENTIL) (2.5 MG/3ML) 0.083% nebulizer solution 3 mL  3 mL Inhalation Q2H PRN Nicholes Stairs, MD      . calcium carbonate (OS-CAL - dosed in mg of elemental calcium) tablet 500 mg of elemental calcium  1 tablet Oral Q supper Nicholes Stairs, MD   500 mg of elemental calcium at 08/23/16 1854  . docusate sodium (COLACE) capsule 100 mg  100 mg Oral BID Nicholes Stairs, MD    100 mg at 08/24/16 1045  . enoxaparin (LOVENOX) injection 40 mg  40 mg Subcutaneous Q24H Nicholes Stairs, MD   40 mg at 08/24/16 4718  . fluticasone (FLONASE) 50 MCG/ACT nasal spray 1 spray  1 spray Each Nare Daily Nicholes Stairs, MD   1 spray at 08/24/16 1000  . gabapentin (NEURONTIN) capsule 1,200 mg  1,200 mg Oral QHS Nicholes Stairs, MD   1,200 mg at 08/23/16 2219  . glimepiride (AMARYL) tablet 2 mg  2 mg Oral BID Nicholes Stairs, MD   2 mg at 08/24/16 1044  . hydrochlorothiazide (MICROZIDE) capsule 12.5 mg  12.5 mg Oral Daily Nicholes Stairs, MD   12.5 mg at 08/24/16 1046  . insulin aspart (novoLOG) injection 0-15 Units  0-15 Units Subcutaneous TID WC Nicholes Stairs, MD   3 Units at 08/24/16 1202  . loratadine (CLARITIN) tablet 10 mg  10 mg Oral Daily Nicholes Stairs, MD   10 mg at 08/24/16 1045  . losartan (COZAAR) tablet 50 mg  50 mg Oral Q breakfast Nicholes Stairs, MD   50 mg at 08/24/16 5501  . metFORMIN (GLUCOPHAGE) tablet 1,000 mg  1,000 mg Oral BID WC Nicholes Stairs, MD   1,000 mg at 08/24/16 0827  . methocarbamol (ROBAXIN) tablet 500 mg  500 mg Oral Q6H PRN Nicholes Stairs, MD   500 mg at 08/23/16 2221  . metoCLOPramide (REGLAN) tablet 10 mg  10 mg Oral BID Nicholes Stairs, MD   10 mg at 08/24/16 1045  . mometasone-formoterol (DULERA) 200-5 MCG/ACT inhaler 2 puff  2 puff Inhalation BID Nicholes Stairs, MD   2 puff at 08/24/16 0854  . morphine 2 MG/ML injection 2 mg  2 mg Intravenous Q2H PRN Nicholes Stairs, MD   2 mg at 08/23/16 0025  . oxyCODONE (Oxy IR/ROXICODONE) immediate release tablet 5-10 mg  5-10 mg Oral Q3H PRN Nicholes Stairs, MD   10 mg at 08/24/16 0828  . pantoprazole (PROTONIX) EC tablet 40 mg  40 mg Oral Daily Nicholes Stairs, MD   40 mg at 08/24/16 1045  . pioglitazone (ACTOS) tablet 45 mg  45 mg Oral Q breakfast Nicholes Stairs, MD   45 mg at 08/24/16 0827  . polyethylene glycol  (MIRALAX / GLYCOLAX) packet 17 g  17 g Oral Daily Nicholes Stairs, MD   17 g at 08/24/16 1045  . pravastatin (PRAVACHOL) tablet 40 mg  40 mg Oral QHS Nicholes Stairs, MD   40 mg at 08/23/16 2219  . [START ON 08/25/2016] Vitamin D (Ergocalciferol) (DRISDOL) capsule 50,000 Units  50,000 Units Oral Q Sat Nicholes Stairs, MD         Discharge Medications: Please see discharge summary for a list of discharge medications.  Relevant Imaging Results:  Relevant Lab Results:   Additional Information [id number]  [place], [place] D, [place]

## 2016-08-24 NOTE — Clinical Social Work Note (Signed)
Clinical Social Work Assessment  Patient Details  Name: Paul [name] MRN: [id number] Date of Birth: 09/12/1941  Date of referral:  08/24/16               Reason for consult:  Facility Placement                Permission sought to share information with:    Permission granted to share information::  Yes, Release of Information Signed  Name::        Agency::     Relationship::     Contact Information:     Housing/Transportation Living arrangements for the past 2 months:  Single Family Home Source of Information:  Patient, Adult Children (son) Patient Interpreter Needed:  None Criminal Activity/Legal Involvement Pertinent to Current Situation/Hospitalization:  No - Comment as needed Significant Relationships:  Adult Children, Spouse Lives with:  Adult Children, Spouse Do you feel safe going back to the place where you live?  Yes Need for family participation in patient care:  Yes (Comment)  Care giving concerns:  Pt stated he is non weight bearing for 3 months   Social Worker assessment / plan:  CSW met with pt and son at bedside.  CSW introduced self and explained the role of CSW.  Pt explained that he was readmitted from [facility].  He had been receiving STR for approximately 1 1/2 weeks prior to this admission.  He was previously admitted due to a fall.  Pt stated he plans to return to [facility] to complete his STR. CSW explained that she would coordinate care with [facility].  CSW will continue to follow and assist with d/c planning needs.  Pt stated he will need to be transported via EMS.  Employment status:  Retired Nurse, adult PT Recommendations:  [place], [place] / Referral to community resources:  [place]  Patient/Family's Response to care:  Pt was appreciative of CSW assistance.  Patient/Family's Understanding of and Emotional Response to Diagnosis, Current Treatment, and  Prognosis:  Pt acknowledged that he needs to return to rehab in order to regain strength.  Emotional Assessment Appearance:  Appears stated age Attitude/Demeanor/Rapport:  Other (cooperative) Affect (typically observed):  Accepting, Pleasant Orientation:  Oriented to Self, Oriented to Place, Oriented to  Time, Oriented to Situation Alcohol / Substance use:  Not Applicable Psych involvement (Current and /or in the community):  No (Comment)  Discharge Needs  Concerns to be addressed:  No discharge needs identified Readmission within the last 30 days:  Yes Current discharge risk:  Dependent with Mobility Barriers to Discharge:  Continued Medical Work up   [place], [place], LCSW 08/24/2016, 2:25 PM

## 2016-08-24 NOTE — Progress Notes (Signed)
Physical Therapy Treatment Patient Details Name: Paul [name] MRN: [id number] DOB: Jun 12, 1941 Today's Date: 08/24/2016    History of Present Illness 75 y.o. male with hx of CAD, COPD, paroxysmal afib not on blood thinners and presented to ED with persistent L leg pain. He had a mechanical fall yesterday and sustained Left Tibial Metaphysis Fracture with distal fibula fracture     PT Comments    Pt continues very motivated and demonstrating good WC skills.  Discussed with RN and pt free to mobilize in wc in halls on this unit only.  Follow Up Recommendations  SNF;Supervision/Assistance - 24 hour     Equipment Recommendations  Wheelchair (measurements PT);Wheelchair cushion (measurements PT)    Recommendations for Other Services OT consult     Precautions / Restrictions Precautions Precautions: Fall Required Braces or Orthoses: Knee Immobilizer - Left Knee Immobilizer - Left: On when out of bed or walking Restrictions Weight Bearing Restrictions: Yes LLE Weight Bearing: Non weight bearing    Mobility  Bed Mobility               General bed mobility comments: Pt OOB with nursing  Transfers Overall transfer level: Needs assistance   Transfers: Stand Pivot Transfers Sit to Stand: Min assist Stand pivot transfers: Min assist       General transfer comment: stand/pvt recliner to wc  Ambulation/Gait                 Hotel manager mobility: Yes Wheelchair propulsion: Both upper extremities Wheelchair parts: Independent Distance: 500 Wheelchair Assistance Details (indicate cue type and reason): min cues for technique in turns.  Pt demonstrating overall good wc skills and saftey awareness.  Modified Rankin (Stroke Patients Only)       Balance                                    Cognition Arousal/Alertness: Awake/alert Behavior During Therapy: WFL for tasks  assessed/performed Overall Cognitive Status: Within Functional Limits for tasks assessed                      Exercises      General Comments        Pertinent Vitals/Pain Pain Assessment: 0-10 Pain Score: 3  Pain Location: L LE Pain Descriptors / Indicators: Aching;Sore Pain Intervention(s): Limited activity within patient's tolerance;Monitored during session;Premedicated before session;Ice applied    Home Living                      Prior Function            PT Goals (current goals can now be found in the care plan section) Acute Rehab PT Goals Patient Stated Goal: get back to rehab then home  PT Goal Formulation: With patient Time For Goal Achievement: 08/17/16 Potential to Achieve Goals: Good Progress towards PT goals: Progressing toward goals    Frequency    Min 3X/week      PT Plan Current plan remains appropriate    Co-evaluation             End of Session Equipment Utilized During Treatment: Gait belt Activity Tolerance: Patient tolerated treatment well Patient left: Other (comment) (up in wc)     Time: 3254-9826 PT Time Calculation (min) (ACUTE ONLY): 31 min  Charges:  $  Therapeutic Activity: 8-22 mins $Wheel Chair Management: 8-22 mins                    G Codes:      Doyel Mulkern 2016/09/03, 12:51 PM

## 2016-08-24 NOTE — Op Note (Addendum)
Date of Surgery: 08/22/2016  INDICATIONS: Paul [name] is a 75 y.o.-year-old male with a left proximal tibia, tibial tubercle and ankle fracture;  The fractures were indicated for surgical management after awaiting swelling to subside.  The patient did consent to the procedure after discussion of the risks and benefits.  PREOPERATIVE DIAGNOSIS: 1. Left proximal tibia fracture 2. Left  Tibial tubercle fracture 3. Left ankle lateral malleolus fracture.  POSTOPERATIVE DIAGNOSIS: Same.  PROCEDURE: 1.  Open reduction internal fixation of left proximal tibial fracture 2. Open reduction internal fixation of left tibial tubercle fracture 3.  Closed management of left ankle fracture without management 4.  Stress test left ankle under fluoroscopy 5. Application of splint left ankle.  SURGEON: Geralynn Rile, M.D.  ASSIST: none.  ANESTHESIA:  general  IV FLUIDS AND URINE: See anesthesia.  ESTIMATED BLOOD LOSS: 100 mL.  IMPLANTS: Synthes 3.5 mm lateral proximal tibia 6 hole plate 4.58m cannulated screw, 64 mm length  DRAINS: none  COMPLICATIONS: None.  DESCRIPTION OF PROCEDURE: The patient was brought to the operating room and placed supine on the operating table.  The patient had been signed prior to the procedure and this was documented. The patient had the anesthesia placed by the anesthesiologist.  A time-out was performed to confirm that this was the correct patient, site, side and location. The patient did receive antibiotics prior to the incision and was re-dosed during the procedure as needed at indicated intervals.  A tourniquet was placed, but not used during the case.  The patient had the operative extremity prepped and draped in the standard surgical fashion.     A lateral incision was used to approach the proximal tibia centered over Gerdy's tubercle.  Dissection was carried down through skin and subcutaneous tissue to the level of IT band fascia at Gerdy's tubercle.  The anterior  compartment musculature was opened sharply and an anterior and posterior flap was elevated proximally to expose Gerdy's and the joint capsule.  The capsule was not opened.  Next, subperiosteal dissection was carried distally into the anterior compartment to expose the lateral proximal tibia and the fracture.  The fracture was felt to have anatomic alignment and this was confirmed with AP and lateral fluoroscopic images.  We then selected an appropriate length plate and slid this along the lateral tibial cortex.  The plate was provisionally pinned with smooth pins proximally to confirm appropriate placement of plate on AP and lateral fluoro images.  Next, the proximal locking screws were drilled, measured and replaced with locking screws for the 4 proximal holes.  Next, three distal cortical nonlocking screws were placed percutaneously using the drill, depth gauge and finally placing the screws with fluoro guidance to confirm appropriate position and length.  Once the plate was secured final AP and lateral flouro pictures showed good anatomic alignment of proximal tibia and correct plate and screw placement.  Next, we turned our attention to the tibial tubercle fracture.  This was noted to be avulsed distally with intact proximal hinge and periosteum.  The distal edge was pushing on the deep dermis and thus needed to be reduced and fixed to prevent skin necrosis.  A clamp was placed under direct visualization on the fragment and reuction was performed after carefully cleaning the fracture free of periosteum and bone.  The proximal hinge was left intact.  Next a guide pin was placed for the 4.516mcannulated screw system.  The length and location of pin along with reduction were checked on  fluoro AP and lateral images and acceptable.  The pin was over drilled and a 26m cannulated 4.572mscrew was placed, using a partially threaded screw to obtain compression.  Final images were obtained.  Next the wound was copiously  irrigated.  Hemostasis was obtained with electrocautery.  Deep subcutaneous layer was closed with 2-0 vicryl and then 0.25% marcaine was infiltrated.  Lastly, staples were used to close the skin.  Next, we turned to the ankle fracture.  A stress exam under fluoroscopy confirmed a stable lateral malleolar fracture without syndesmotic injury.  Given the patient's poorly controlled diabetes and concomitant injuries requiring NWB we elected to manage the ankle nonoperatively and reduce his risk of infection or wound complication.  A sterile dressing was applied to the knee along with knee immobilizer.  A short leg splint was applied to the ankle.    All counts were correct and the drapes were removed and patient awakened uneventfully.  The patient was transferred into PACU awake and in stable condition.  There were no immediate complications.  POSTOPERATIVE PLAN: He will be NWB to LLE for 10 more weeks.  The knee immoblizer will be worn for 6 weeks, and then he can begin Knee ROM, but will maintain NWB for 10 more weeks.  He will get lovenox for dvt ppx.  We will admit to inpatient for PT evaluation and pain control.  Paul [name]

## 2016-08-24 NOTE — Discharge Instructions (Signed)
No weight bearing to left leg Maintain Knee immobilizer unless leg is straight while in bed lovenox daily for 4 weeks Do not get wound wet until follow up with surgeon

## 2016-08-24 NOTE — Progress Notes (Addendum)
Subjective:  Patient reports pain as mild.  Well controlled with pain meds.  He is comfortable in chair.  Objective:   VITALS:   Vitals:   08/23/16 2200 08/24/16 0559 08/24/16 0828 08/24/16 0855  BP:  (!) 152/69 (!) 144/64   Pulse:  76 80   Resp:  18    Temp:  98 F (36.7 C)    TempSrc:  Oral    SpO2: 93% 96%  96%  Weight:      Height:        Neurologically intact Neurovascular intact Sensation intact distally No cellulitis present moves toes   Dressing taken down and wound inspected.  Skin is c/d/i.  There is an area of dusky skin along the distal end of incision, approximately 2 cm long.   Lab Results  Component Value Date   WBC 9.2 08/23/2016   HGB 10.9 (L) 08/23/2016   HCT 34.7 (L) 08/23/2016   MCV 84.2 08/23/2016   PLT 299 08/23/2016   BMET    Component Value Date/Time   NA 139 08/10/2016 0443   NA 140 01/13/2014 0922   K 3.5 08/10/2016 0443   K 4.6 01/13/2014 0922   CL 106 08/10/2016 0443   CL 105 01/13/2013 0856   CO2 26 08/10/2016 0443   CO2 26 01/13/2014 0922   GLUCOSE 129 (H) 08/10/2016 0443   GLUCOSE 112 01/13/2014 0922   GLUCOSE 174 (H) 01/13/2013 0856   BUN 13 08/10/2016 0443   BUN 20.1 01/13/2014 0922   CREATININE 0.58 (L) 08/10/2016 0443   CREATININE 0.9 01/13/2014 0922   CALCIUM 8.6 (L) 08/10/2016 0443   CALCIUM 9.9 01/13/2014 0922   GFRNONAA >60 08/10/2016 0443   GFRAA >60 08/10/2016 0443     Assessment/Plan: 2 Days Post-Op   Active Problems:   Tibial fracture   Closed fracture of proximal end of left tibia   Ankle fracture, left   Plan- -NWB LLE -keep dressing for now on knee, will change POD 2 - plan to maintain splint on ankle for 2 weeks -PT/OT for dc planning, looking like SNF. fl2 complete, will look for dc over weekend -lovenox for dvt ppx -SS insulin with home meds for DM -Pulm toilet  -pain control -Diabetic diet   Nicholes Stairs 08/24/2016, 1:21 PM   Geralynn Rile, MD [phone]

## 2016-08-25 LAB — GLUCOSE, CAPILLARY
Glucose-Capillary: 103 mg/dL — ABNORMAL HIGH (ref 65–99)
Glucose-Capillary: 198 mg/dL — ABNORMAL HIGH (ref 65–99)
Glucose-Capillary: 98 mg/dL (ref 65–99)
Glucose-Capillary: 112 mg/dL — ABNORMAL HIGH (ref 65–99)
Glucose-Capillary: 155 mg/dL — ABNORMAL HIGH (ref 65–99)

## 2016-08-25 NOTE — Progress Notes (Signed)
    Subjective: 3 Days Post-Op Procedure(s) (LRB): OPEN REDUCTION INTERNAL FIXATION (ORIF) TIBIA FRACTURE (Left) Patient reports pain as 6 on 0-10 scale.   Denies CP or SOB.  Voiding without difficulty. Positive BM yesterday.   Objective: Vital signs in last 24 hours: Temp:  [98.2 F (36.8 C)-98.8 F (37.1 C)] 98.6 F (37 C) (10/14 0636) Pulse Rate:  [80-120] 80 (10/14 0636) Resp:  [16-21] 21 (10/14 0636) BP: (110-145)/(52-75) 145/75 (10/14 0636) SpO2:  [95 %-97 %] 96 % (10/14 0636)  Intake/Output from previous day: 10/13 0701 - 10/14 0700 In: 1020 [P.O.:1020] Out: 1375 [Urine:1375] Intake/Output this shift: Total I/O In: 240 [P.O.:240] Out: 520 [Urine:520]  Labs:  Recent Labs  08/23/16 0751  HGB 10.9*    Recent Labs  08/23/16 0751  WBC 9.2  RBC 4.12*  HCT 34.7*  PLT 299   No results for input(s): NA, K, CL, CO2, BUN, CREATININE, GLUCOSE, CALCIUM in the last 72 hours. No results for input(s): LABPT, INR in the last 72 hours.  Physical Exam: Neurologically intact ABD soft Sensation intact distally Dorsiflexion/Plantar flexion intact Incision: dressing C/D/I Compartment soft Knee immobilizer in place Assessment/Plan: 3 Days Post-Op Procedure(s) (LRB): OPEN REDUCTION INTERNAL FIXATION (ORIF) TIBIA FRACTURE (Left) Advance diet Plan for discharge tomorrow.  Considered DC for today.  Pt does not think he can because of pain NWB, knee immobilizer at all time  [name], Darla Lesches for Dr. Melina Schools [facility] [phone] 08/25/2016, 8:54 AM    Patient ID: Paul [name], male   DOB: Dec 01, 1940, 75 y.o.   MRN: [id number]

## 2016-08-25 NOTE — Progress Notes (Signed)
Physical Therapy Treatment Patient Details Name: SHAUL TRAUTMAN MRN: [id number] DOB: 30-Mar-1941 Today's Date: 08/25/2016    History of Present Illness 75 y.o. male with hx of CAD, COPD, paroxysmal afib not on blood thinners and presented to ED with persistent L leg pain. He had a mechanical fall yesterday and sustained Left Tibial Metaphysis Fracture with distal fibula fracture     PT Comments    Pt continues motivated to progress and eager for dc to rehab setting.  Follow Up Recommendations  SNF;Supervision/Assistance - 24 hour     Equipment Recommendations  Wheelchair (measurements PT);Wheelchair cushion (measurements PT)    Recommendations for Other Services OT consult     Precautions / Restrictions Precautions Precautions: Fall Required Braces or Orthoses: Knee Immobilizer - Left Knee Immobilizer - Left: On when out of bed or walking Restrictions Weight Bearing Restrictions: Yes LLE Weight Bearing: Non weight bearing    Mobility  Bed Mobility               General bed mobility comments: Pt OOB with nursing  Transfers Overall transfer level: Needs assistance Equipment used: Rolling walker (2 wheeled) Transfers: Sit to/from Stand Sit to Stand: Min guard Stand pivot transfers: Min assist       General transfer comment: Pt transferred bed<>chair toward weaker side with RW.  Pt sit<>stand x6 with cues for use of UEs and LE management for safest transition.  Ambulation/Gait             General Gait Details: NT - pt unable to sufficiently WB on UEs to allow NWB on L LE and step with R LE   Hotel manager mobility: Yes Wheelchair propulsion: Both upper extremities;Left upper extremity Wheelchair parts: Independent Distance: 400 Wheelchair Assistance Details (indicate cue type and reason): Pt utilizing R LE to assist in chair propulsion to limit UE fatigue  Modified Rankin (Stroke  Patients Only)       Balance                                    Cognition Arousal/Alertness: Awake/alert Behavior During Therapy: WFL for tasks assessed/performed Overall Cognitive Status: Within Functional Limits for tasks assessed                      Exercises      General Comments        Pertinent Vitals/Pain Pain Assessment: 0-10 Pain Score: 4  Pain Location: L LE with increased time in dependent position  Pain Descriptors / Indicators: Aching;Throbbing Pain Intervention(s): Limited activity within patient's tolerance;Monitored during session;Premedicated before session;Ice applied    Home Living                      Prior Function            PT Goals (current goals can now be found in the care plan section) Acute Rehab PT Goals Patient Stated Goal: get back to rehab then home  PT Goal Formulation: With patient Time For Goal Achievement: 08/17/16 Potential to Achieve Goals: Good Progress towards PT goals: Progressing toward goals    Frequency    Min 3X/week      PT Plan Current plan remains appropriate    Co-evaluation             End  of Session Equipment Utilized During Treatment: Gait belt Activity Tolerance: Patient tolerated treatment well Patient left: in chair;with call bell/phone within reach     Time: 1136-1207 PT Time Calculation (min) (ACUTE ONLY): 31 min  Charges:  $Therapeutic Activity: 8-22 mins $Wheel Chair Management: 8-22 mins                    G Codes:      Ophie Burrowes 2016/09/24, 1:14 PM

## 2016-08-26 DIAGNOSIS — D649 Anemia, unspecified: Secondary | ICD-10-CM | POA: Diagnosis not present

## 2016-08-26 DIAGNOSIS — S82226A Nondisplaced transverse fracture of shaft of unspecified tibia, initial encounter for closed fracture: Secondary | ICD-10-CM | POA: Diagnosis not present

## 2016-08-26 DIAGNOSIS — S8290XD Unspecified fracture of unspecified lower leg, subsequent encounter for closed fracture with routine healing: Secondary | ICD-10-CM | POA: Diagnosis not present

## 2016-08-26 DIAGNOSIS — J449 Chronic obstructive pulmonary disease, unspecified: Secondary | ICD-10-CM | POA: Diagnosis not present

## 2016-08-26 DIAGNOSIS — G629 Polyneuropathy, unspecified: Secondary | ICD-10-CM | POA: Diagnosis not present

## 2016-08-26 DIAGNOSIS — M8000XD Age-related osteoporosis with current pathological fracture, unspecified site, subsequent encounter for fracture with routine healing: Secondary | ICD-10-CM | POA: Diagnosis not present

## 2016-08-26 DIAGNOSIS — R69 Illness, unspecified: Secondary | ICD-10-CM | POA: Diagnosis not present

## 2016-08-26 DIAGNOSIS — I4891 Unspecified atrial fibrillation: Secondary | ICD-10-CM | POA: Diagnosis not present

## 2016-08-26 DIAGNOSIS — E119 Type 2 diabetes mellitus without complications: Secondary | ICD-10-CM | POA: Diagnosis not present

## 2016-08-26 DIAGNOSIS — S82832D Other fracture of upper and lower end of left fibula, subsequent encounter for closed fracture with routine healing: Secondary | ICD-10-CM | POA: Diagnosis not present

## 2016-08-26 DIAGNOSIS — I2510 Atherosclerotic heart disease of native coronary artery without angina pectoris: Secondary | ICD-10-CM | POA: Diagnosis not present

## 2016-08-26 DIAGNOSIS — E0590 Thyrotoxicosis, unspecified without thyrotoxic crisis or storm: Secondary | ICD-10-CM | POA: Diagnosis not present

## 2016-08-26 DIAGNOSIS — S82112A Displaced fracture of left tibial spine, initial encounter for closed fracture: Secondary | ICD-10-CM | POA: Diagnosis not present

## 2016-08-26 DIAGNOSIS — M6281 Muscle weakness (generalized): Secondary | ICD-10-CM | POA: Diagnosis not present

## 2016-08-26 DIAGNOSIS — M9722XA Periprosthetic fracture around internal prosthetic left ankle joint, initial encounter: Secondary | ICD-10-CM | POA: Diagnosis not present

## 2016-08-26 DIAGNOSIS — J441 Chronic obstructive pulmonary disease with (acute) exacerbation: Secondary | ICD-10-CM | POA: Diagnosis not present

## 2016-08-26 DIAGNOSIS — R2689 Other abnormalities of gait and mobility: Secondary | ICD-10-CM | POA: Diagnosis not present

## 2016-08-26 DIAGNOSIS — S82899A Other fracture of unspecified lower leg, initial encounter for closed fracture: Secondary | ICD-10-CM | POA: Diagnosis not present

## 2016-08-26 DIAGNOSIS — Z79899 Other long term (current) drug therapy: Secondary | ICD-10-CM | POA: Diagnosis not present

## 2016-08-26 DIAGNOSIS — E559 Vitamin D deficiency, unspecified: Secondary | ICD-10-CM | POA: Diagnosis not present

## 2016-08-26 DIAGNOSIS — W19XXXD Unspecified fall, subsequent encounter: Secondary | ICD-10-CM | POA: Diagnosis not present

## 2016-08-26 DIAGNOSIS — I480 Paroxysmal atrial fibrillation: Secondary | ICD-10-CM | POA: Diagnosis not present

## 2016-08-26 DIAGNOSIS — I10 Essential (primary) hypertension: Secondary | ICD-10-CM | POA: Diagnosis not present

## 2016-08-26 DIAGNOSIS — E118 Type 2 diabetes mellitus with unspecified complications: Secondary | ICD-10-CM | POA: Diagnosis not present

## 2016-08-26 DIAGNOSIS — K219 Gastro-esophageal reflux disease without esophagitis: Secondary | ICD-10-CM | POA: Diagnosis not present

## 2016-08-26 DIAGNOSIS — S82401D Unspecified fracture of shaft of right fibula, subsequent encounter for closed fracture with routine healing: Secondary | ICD-10-CM | POA: Diagnosis not present

## 2016-08-26 DIAGNOSIS — S82202D Unspecified fracture of shaft of left tibia, subsequent encounter for closed fracture with routine healing: Secondary | ICD-10-CM | POA: Diagnosis not present

## 2016-08-26 DIAGNOSIS — S82192D Other fracture of upper end of left tibia, subsequent encounter for closed fracture with routine healing: Secondary | ICD-10-CM | POA: Diagnosis not present

## 2016-08-26 LAB — GLUCOSE, CAPILLARY: Glucose-Capillary: 114 mg/dL — ABNORMAL HIGH (ref 65–99)

## 2016-08-26 NOTE — Care Management Note (Signed)
Case Management Note  Patient Details  Name: Paul [name] MRN: [id number] Date of Birth: 07-Jun-1941  Subjective/Objective:    Closed fracture of proximal end of left tibia, s/p ORIF of tibia fracture                Action/Plan: Discharge Planning: AVS reviewed: NCM spoke to pt and plan is dc to SNF. Chart reviewed. Scheduled dc to SNF. CSW following for SNF placement.   Expected Discharge Date:  08/26/2016              Expected Discharge Plan:  Skilled Nursing Facility  In-House Referral:  Clinical Social Work  Discharge planning Services  CM Consult  Post Acute Care Choice:  NA Choice offered to:  NA  DME Arranged:  N/A DME Agency:  NA  HH Arranged:  NA HH Agency:  NA  Status of Service:  Completed, signed off  If discussed at [employer] of Stay Meetings, dates discussed:    Additional Comments:  Erenest Rasher, RN 08/26/2016, 9:37 AM

## 2016-08-26 NOTE — Clinical Social Work Note (Addendum)
Pt is ready for discharge today to [facility]. Pt and family are aware and agreeable to discharge plan. Facility is ready to admit pt as they have received discharge information. RN called report and [facility] will provide transportation. CSW signing off as no further needs identified.   Darden Dates, MSW, LCSW  Clinical Social Worker [phone]

## 2016-08-26 NOTE — Discharge Summary (Signed)
Physician Discharge Summary  Patient ID: Paul [name] MRN: [id number] DOB/AGE: Sep 27, 1941 75 y.o.  Admit date: 08/22/2016 Discharge date: 08/26/2016  Admission Diagnoses:  Discharge Diagnoses:  Active Problems:   Tibial fracture   Closed fracture of proximal end of left tibia   Ankle fracture, left   Discharged Condition: good  Hospital Course:  Paul [name] is a 75 y.o. who was admitted to [facility]. They were brought to the operating room on 08/22/2016 and underwent Procedure(s): OPEN REDUCTION INTERNAL FIXATION (ORIF) TIBIA FRACTURE.  Patient tolerated the procedure well and was later transferred to the recovery room and then to the orthopaedic floor for postoperative care.  They were given PO and IV analgesics for pain control following their surgery.  They were given 24 hours of postoperative antibiotics of  Anti-infectives    Start     Dose/Rate Route Frequency Ordered Stop   08/23/16 0100  ceFAZolin (ANCEF) IVPB 2g/100 mL premix     2 g 200 mL/hr over 30 Minutes Intravenous Every 8 hours 08/22/16 2059 08/23/16 0524   08/22/16 1445  ceFAZolin (ANCEF) IVPB 2g/100 mL premix     2 g 200 mL/hr over 30 Minutes Intravenous On call to O.R. 08/22/16 1437 08/22/16 1719     and started on DVT prophylaxis.   PT and OT were ordered for total joint protocol.  Discharge planning consulted to help with postop disposition and equipment needs.  Patient had a good night on the evening of surgery and started to get up OOB with therapy on day one.   Continued to work with therapy into day two.  Dressing was with normal limits.  The patient had progressed with therapy and meeting their goals. Patient was seen in rounds and was ready to go home.  Consults: n/a  Significant Diagnostic Studies: routine  Treatments:routine  Discharge Exam: Blood pressure 136/67, pulse 93, temperature 98.3 F (36.8 C), temperature source Oral, resp. rate 20, height _0  (1.702 m), weight  108.8 kg (239 lb 12.8 oz), SpO2 91 %. Alert and oriented x3. RRR, Lungs clear, BS x4. Left Calf soft and non tender. L LE dressing C/D/I. No DVT signs. No signs of infection or compartment syndrome. LLE grossly neurovascularly intact.   Disposition: 03-Skilled Nursing Facility     Medication List    TAKE these medications   acetaminophen 500 MG tablet Commonly known as:  TYLENOL Take 1,000 mg by mouth daily with breakfast.   budesonide-formoterol 160-4.5 MCG/ACT inhaler Commonly known as:  SYMBICORT Take 2 puffs first thing in am and then another 2 puffs about 12 hours later. What changed:  how much to take  how to take this  when to take this  additional instructions   calcium carbonate 600 MG Tabs tablet Commonly known as:  OS-CAL Take 600 mg by mouth daily with supper.   CENTRUM SILVER ADULT 50+ Tabs Take 1 tablet by mouth daily with breakfast.   docusate sodium 100 MG capsule Commonly known as:  COLACE Take 1 capsule (100 mg total) by mouth 2 (two) times daily. HOLD for loose stool or diarrhea   enoxaparin 40 MG/0.4ML injection Commonly known as:  LOVENOX Inject 0.4 mLs (40 mg total) into the skin daily. What changed:  Another medication with the same name was added. Make sure you understand how and when to take each.   enoxaparin 40 MG/0.4ML injection Commonly known as:  LOVENOX Inject 0.4 mLs (40 mg total) into the skin daily. What changed:  You were already taking a medication with the same name, and this prescription was added. Make sure you understand how and when to take each.   fexofenadine 180 MG tablet Commonly known as:  ALLEGRA Take 180 mg by mouth daily with breakfast.   fluticasone 50 MCG/ACT nasal spray Commonly known as:  FLONASE Place 1 spray into the nose 2 (two) times daily.   Fluticasone-Salmeterol 250-50 MCG/DOSE Aepb Commonly known as:  ADVAIR Inhale 1 puff into the lungs 2 (two) times daily.   gabapentin 300 MG capsule Commonly  known as:  NEURONTIN Take 6 capsules by mouth at bedtime What changed:  how much to take  how to take this  when to take this  additional instructions   glimepiride 2 MG tablet Commonly known as:  AMARYL Take 2 mg by mouth 2 (two) times daily.   guaiFENesin 600 MG 12 hr tablet Commonly known as:  MUCINEX Take 1,200 mg by mouth daily with breakfast.   hydrochlorothiazide 12.5 MG capsule Commonly known as:  MICROZIDE Take 12.5 mg by mouth daily.   losartan 50 MG tablet Commonly known as:  COZAAR Take 1 tablet (50 mg total) by mouth daily. What changed:  when to take this   metFORMIN 1000 MG tablet Commonly known as:  GLUCOPHAGE Take 1,000 mg by mouth 2 (two) times daily with a meal.   methocarbamol 500 MG tablet Commonly known as:  ROBAXIN Take 1 tablet (500 mg total) by mouth every 6 (six) hours as needed for muscle spasms.   metoCLOPramide 10 MG tablet Commonly known as:  REGLAN Take 10 mg by mouth 2 (two) times daily.   oxyCODONE 5 MG immediate release tablet Commonly known as:  Oxy IR/ROXICODONE Take 1-2 tablets (5-10 mg total) by mouth every 3 (three) hours as needed for moderate pain, severe pain or breakthrough pain. What changed:  Another medication with the same name was added. Make sure you understand how and when to take each.   oxyCODONE 5 MG immediate release tablet Commonly known as:  ROXICODONE Take 1-2 tablets (5-10 mg total) by mouth every 4 (four) hours as needed for severe pain. What changed:  You were already taking a medication with the same name, and this prescription was added. Make sure you understand how and when to take each.   OXYGEN Place 2 L into the nose at bedtime. 2L of oxygen at night and 2L of oxygen when walking long distances   pantoprazole 40 MG tablet Commonly known as:  PROTONIX Take 1 tablet (40 mg total) by mouth daily.   pioglitazone 45 MG tablet Commonly known as:  ACTOS Take 45 mg by mouth daily with breakfast.    polyethylene glycol packet Commonly known as:  MIRALAX / GLYCOLAX Take 17 g by mouth daily.   pravastatin 40 MG tablet Commonly known as:  PRAVACHOL Take 1 tablet (40 mg total) by mouth daily. What changed:  when to take this   PROAIR HFA 108 (90 Base) MCG/ACT inhaler Generic drug:  albuterol Inhale 2 puffs into the lungs every 2 (two) hours as needed for wheezing or shortness of breath.   Vitamin D (Ergocalciferol) 50000 units Caps capsule Commonly known as:  DRISDOL Take 50,000 Units by mouth every Saturday.        SignedLajean Manes 08/26/2016, 8:14 AM

## 2016-08-26 NOTE — Progress Notes (Signed)
Subjective: 4 Days Post-Op Procedure(s) (LRB): OPEN REDUCTION INTERNAL FIXATION (ORIF) TIBIA FRACTURE (Left) Patient reports pain as well controlled.  Reports a good night. tolerating PO's. Denies Cp, SOb, or calf pain.  Objective: Vital signs in last 24 hours: Temp:  [97.8 F (36.6 C)-98.7 F (37.1 C)] 98.3 F (36.8 C) (10/15 0701) Pulse Rate:  [68-93] 93 (10/15 0701) Resp:  [20] 20 (10/15 0701) BP: (126-136)/(50-68) 136/67 (10/15 0701) SpO2:  [91 %-96 %] 91 % (10/15 0701)  Intake/Output from previous day: 10/14 0701 - 10/15 0700 In: 840 [P.O.:840] Out: 2120 [Urine:2120] Intake/Output this shift: Total I/O In: 240 [P.O.:240] Out: 500 [Urine:500]  No results for input(s): HGB in the last 72 hours. No results for input(s): WBC, RBC, HCT, PLT in the last 72 hours. No results for input(s): NA, K, CL, CO2, BUN, CREATININE, GLUCOSE, CALCIUM in the last 72 hours. No results for input(s): LABPT, INR in the last 72 hours.  Well nourished. Alert and oriented x3. RRR, Lungs clear, BS x4. Abdomen soft and non tender. [name] Calf soft and non tender. [name] LE dressing/splint C/D/I. No DVT signs. Compartment soft. No signs of infection.  [name] LE grossly neurovascular intact.  Assessment/Plan: 4 Days Post-Op Procedure(s) (LRB): OPEN REDUCTION INTERNAL FIXATION (ORIF) TIBIA FRACTURE (Left) D/c to SNF NWB RLE Follow instructions Knee immbolizer  Paul [name] 08/26/2016, 8:09 AM

## 2016-08-27 DIAGNOSIS — I10 Essential (primary) hypertension: Secondary | ICD-10-CM | POA: Diagnosis not present

## 2016-08-27 DIAGNOSIS — S8290XD Unspecified fracture of unspecified lower leg, subsequent encounter for closed fracture with routine healing: Secondary | ICD-10-CM | POA: Diagnosis not present

## 2016-08-27 DIAGNOSIS — W19XXXD Unspecified fall, subsequent encounter: Secondary | ICD-10-CM | POA: Diagnosis not present

## 2016-08-27 DIAGNOSIS — E118 Type 2 diabetes mellitus with unspecified complications: Secondary | ICD-10-CM | POA: Diagnosis not present

## 2016-08-31 DIAGNOSIS — S82226A Nondisplaced transverse fracture of shaft of unspecified tibia, initial encounter for closed fracture: Secondary | ICD-10-CM | POA: Diagnosis not present

## 2016-08-31 DIAGNOSIS — I2510 Atherosclerotic heart disease of native coronary artery without angina pectoris: Secondary | ICD-10-CM | POA: Diagnosis not present

## 2016-08-31 DIAGNOSIS — J449 Chronic obstructive pulmonary disease, unspecified: Secondary | ICD-10-CM | POA: Diagnosis not present

## 2016-08-31 DIAGNOSIS — I4891 Unspecified atrial fibrillation: Secondary | ICD-10-CM | POA: Diagnosis not present

## 2016-08-31 DIAGNOSIS — S82899A Other fracture of unspecified lower leg, initial encounter for closed fracture: Secondary | ICD-10-CM | POA: Diagnosis not present

## 2016-08-31 DIAGNOSIS — E119 Type 2 diabetes mellitus without complications: Secondary | ICD-10-CM | POA: Diagnosis not present

## 2016-08-31 DIAGNOSIS — I10 Essential (primary) hypertension: Secondary | ICD-10-CM | POA: Diagnosis not present

## 2016-08-31 DIAGNOSIS — G629 Polyneuropathy, unspecified: Secondary | ICD-10-CM | POA: Diagnosis not present

## 2016-09-04 DIAGNOSIS — S82192D Other fracture of upper end of left tibia, subsequent encounter for closed fracture with routine healing: Secondary | ICD-10-CM | POA: Diagnosis not present

## 2016-09-04 DIAGNOSIS — S82832D Other fracture of upper and lower end of left fibula, subsequent encounter for closed fracture with routine healing: Secondary | ICD-10-CM | POA: Diagnosis not present

## 2016-09-05 ENCOUNTER — Ambulatory Visit: Payer: Medicare Other | Admitting: Podiatry

## 2016-09-17 DIAGNOSIS — S8290XD Unspecified fracture of unspecified lower leg, subsequent encounter for closed fracture with routine healing: Secondary | ICD-10-CM | POA: Diagnosis not present

## 2016-09-17 DIAGNOSIS — J449 Chronic obstructive pulmonary disease, unspecified: Secondary | ICD-10-CM | POA: Diagnosis not present

## 2016-09-17 DIAGNOSIS — I4891 Unspecified atrial fibrillation: Secondary | ICD-10-CM | POA: Diagnosis not present

## 2016-09-17 DIAGNOSIS — W19XXXD Unspecified fall, subsequent encounter: Secondary | ICD-10-CM | POA: Diagnosis not present

## 2016-09-18 ENCOUNTER — Other Ambulatory Visit: Payer: Self-pay | Admitting: *Deleted

## 2016-09-18 NOTE — Patient Outreach (Signed)
Met with patient at skilled facility. Patient states he will be going home on 11/11. He is NWB until at least 11/22, but possible to be NWB until December or January. Patient has support from his wife, who has her own health issues at this time. He needs a wheelchair due to NWB status. Patient states he has to work on his transfers in and out of car.  Patient has hx of recent fall, COPD gold, Diabetes.  Patient confirms Dr. Darcus Austin as primary care physician, he also sees Dr. Melvyn Novas for pulmonology.  Patient agrees to [name] care management services, he verbalizes understanding of telephone calls and possible home visits from a nurse.  Spoke with Jacqlyn Larsen, [place] coordinator who is working on discharge planning. She states Rehab will perform a home evaluation on Thursday.  Plan to refer to [facility].  Plan to continue to work with Discharge planner on discharge needs identified.  Royetta Crochet. Laymond Purser, RN, BSN, [facility] Care Coordinator [phone]

## 2016-09-19 ENCOUNTER — Other Ambulatory Visit: Payer: Self-pay | Admitting: *Deleted

## 2016-09-19 NOTE — Patient Outreach (Signed)
Met with patient and wife, Santiago Glad. Patient able to sign consent and again confirms his wishes for [facility] program services upon discharge from SNF.  Spoke with Jacqlyn Larsen, discharge planner, patient was approved for wheelchair and home care services. She will be able to get them arranged before discharge home, plan for discharge 11/11.  RNCM will sign off but be available for any new concerns or issues while patient still in Skilled facility. Royetta Crochet. Laymond Purser, RN, BSN, [place] [facility] Care Coordinator [phone]

## 2016-09-23 DIAGNOSIS — J449 Chronic obstructive pulmonary disease, unspecified: Secondary | ICD-10-CM | POA: Diagnosis not present

## 2016-09-23 DIAGNOSIS — S82892D Other fracture of left lower leg, subsequent encounter for closed fracture with routine healing: Secondary | ICD-10-CM | POA: Diagnosis not present

## 2016-09-24 ENCOUNTER — Other Ambulatory Visit: Payer: Self-pay | Admitting: *Deleted

## 2016-09-24 ENCOUNTER — Encounter: Payer: Self-pay | Admitting: *Deleted

## 2016-09-24 DIAGNOSIS — S82892D Other fracture of left lower leg, subsequent encounter for closed fracture with routine healing: Secondary | ICD-10-CM | POA: Diagnosis not present

## 2016-09-24 DIAGNOSIS — J449 Chronic obstructive pulmonary disease, unspecified: Secondary | ICD-10-CM | POA: Diagnosis not present

## 2016-09-24 NOTE — Patient Outreach (Signed)
Initial transition of care call completed. I will call him again in a week.  Deloria Lair [facility] [phone]

## 2016-09-27 DIAGNOSIS — S82892D Other fracture of left lower leg, subsequent encounter for closed fracture with routine healing: Secondary | ICD-10-CM | POA: Diagnosis not present

## 2016-09-27 DIAGNOSIS — J449 Chronic obstructive pulmonary disease, unspecified: Secondary | ICD-10-CM | POA: Diagnosis not present

## 2016-09-28 DIAGNOSIS — S82892D Other fracture of left lower leg, subsequent encounter for closed fracture with routine healing: Secondary | ICD-10-CM | POA: Diagnosis not present

## 2016-09-28 DIAGNOSIS — J449 Chronic obstructive pulmonary disease, unspecified: Secondary | ICD-10-CM | POA: Diagnosis not present

## 2016-10-01 ENCOUNTER — Other Ambulatory Visit: Payer: Self-pay | Admitting: *Deleted

## 2016-10-02 NOTE — Patient Outreach (Signed)
Transition of care call. Pt is doing well. He has an appt with his orthopedist tomorrow. He reports he is not having any trouble breathing (pt smokes and advised me he would not be quitting) He reports his last random FBS was 136. He does not check his glucose routinely. His last Hgb A1C was 6.5. Paul [name] answered additional questions for the initial assessment today and we went over his care plan. I will call him again next week.  Deloria Lair [facility] [phone]

## 2016-10-03 DIAGNOSIS — S82832D Other fracture of upper and lower end of left fibula, subsequent encounter for closed fracture with routine healing: Secondary | ICD-10-CM | POA: Diagnosis not present

## 2016-10-03 DIAGNOSIS — S82192D Other fracture of upper end of left tibia, subsequent encounter for closed fracture with routine healing: Secondary | ICD-10-CM | POA: Diagnosis not present

## 2016-10-05 DIAGNOSIS — S82892D Other fracture of left lower leg, subsequent encounter for closed fracture with routine healing: Secondary | ICD-10-CM | POA: Diagnosis not present

## 2016-10-05 DIAGNOSIS — J449 Chronic obstructive pulmonary disease, unspecified: Secondary | ICD-10-CM | POA: Diagnosis not present

## 2016-10-08 ENCOUNTER — Other Ambulatory Visit: Payer: Self-pay | Admitting: *Deleted

## 2016-10-08 NOTE — Patient Outreach (Signed)
Transition of care call. Pt is doing well. He has had an orthopedic evaluation and can now take his immobilizer and boot off for periods during the day. He must keep his leg straight and elevated. He is not having any new problems. I have reinforced to follow MD instructions, call me if he does have a question or problem. I will call him again next week.  Deloria Lair [facility] [phone]

## 2016-10-09 DIAGNOSIS — S82892D Other fracture of left lower leg, subsequent encounter for closed fracture with routine healing: Secondary | ICD-10-CM | POA: Diagnosis not present

## 2016-10-09 DIAGNOSIS — J449 Chronic obstructive pulmonary disease, unspecified: Secondary | ICD-10-CM | POA: Diagnosis not present

## 2016-10-11 DIAGNOSIS — J449 Chronic obstructive pulmonary disease, unspecified: Secondary | ICD-10-CM | POA: Diagnosis not present

## 2016-10-11 DIAGNOSIS — S82892D Other fracture of left lower leg, subsequent encounter for closed fracture with routine healing: Secondary | ICD-10-CM | POA: Diagnosis not present

## 2016-10-15 ENCOUNTER — Other Ambulatory Visit: Payer: Self-pay | Admitting: *Deleted

## 2016-10-15 DIAGNOSIS — S82892D Other fracture of left lower leg, subsequent encounter for closed fracture with routine healing: Secondary | ICD-10-CM | POA: Diagnosis not present

## 2016-10-15 DIAGNOSIS — J449 Chronic obstructive pulmonary disease, unspecified: Secondary | ICD-10-CM | POA: Diagnosis not present

## 2016-10-15 NOTE — Patient Outreach (Signed)
Transition of care call. Pt is doing well. He is getting PT. He is now able to walk with a walker with touch down only of the affected leg. He is wearing the immobilizer most of the time as he felt like the leg was too cumbersome with only the boot. His pain is well controlled. He denies any other needs at this time.  Deloria Lair [facility] [place] [phone]

## 2016-10-15 NOTE — Patient Outreach (Signed)
Transition of care call:

## 2016-10-17 DIAGNOSIS — I10 Essential (primary) hypertension: Secondary | ICD-10-CM | POA: Diagnosis not present

## 2016-10-17 DIAGNOSIS — Z7984 Long term (current) use of oral hypoglycemic drugs: Secondary | ICD-10-CM | POA: Diagnosis not present

## 2016-10-17 DIAGNOSIS — I4891 Unspecified atrial fibrillation: Secondary | ICD-10-CM | POA: Diagnosis not present

## 2016-10-17 DIAGNOSIS — J449 Chronic obstructive pulmonary disease, unspecified: Secondary | ICD-10-CM | POA: Diagnosis not present

## 2016-10-17 DIAGNOSIS — E1165 Type 2 diabetes mellitus with hyperglycemia: Secondary | ICD-10-CM | POA: Diagnosis not present

## 2016-10-17 DIAGNOSIS — E7800 Pure hypercholesterolemia, unspecified: Secondary | ICD-10-CM | POA: Diagnosis not present

## 2016-10-17 DIAGNOSIS — E559 Vitamin D deficiency, unspecified: Secondary | ICD-10-CM | POA: Diagnosis not present

## 2016-10-17 DIAGNOSIS — F17210 Nicotine dependence, cigarettes, uncomplicated: Secondary | ICD-10-CM | POA: Diagnosis not present

## 2016-10-18 DIAGNOSIS — J449 Chronic obstructive pulmonary disease, unspecified: Secondary | ICD-10-CM | POA: Diagnosis not present

## 2016-10-18 DIAGNOSIS — S82892D Other fracture of left lower leg, subsequent encounter for closed fracture with routine healing: Secondary | ICD-10-CM | POA: Diagnosis not present

## 2016-10-22 ENCOUNTER — Ambulatory Visit: Payer: Medicare Other | Admitting: *Deleted

## 2016-10-22 DIAGNOSIS — S82892D Other fracture of left lower leg, subsequent encounter for closed fracture with routine healing: Secondary | ICD-10-CM | POA: Diagnosis not present

## 2016-10-22 DIAGNOSIS — J449 Chronic obstructive pulmonary disease, unspecified: Secondary | ICD-10-CM | POA: Diagnosis not present

## 2016-10-24 ENCOUNTER — Other Ambulatory Visit: Payer: Self-pay | Admitting: *Deleted

## 2016-10-25 ENCOUNTER — Ambulatory Visit: Payer: Medicare Other | Admitting: Neurology

## 2016-10-25 ENCOUNTER — Encounter: Payer: Self-pay | Admitting: *Deleted

## 2016-10-25 DIAGNOSIS — J449 Chronic obstructive pulmonary disease, unspecified: Secondary | ICD-10-CM | POA: Diagnosis not present

## 2016-10-25 DIAGNOSIS — S82892D Other fracture of left lower leg, subsequent encounter for closed fracture with routine healing: Secondary | ICD-10-CM | POA: Diagnosis not present

## 2016-10-25 NOTE — Patient Outreach (Addendum)
Final transition of care call and case closure. Pt reports he just finished his therapy session. He is making good progress. He is now allowed to walk with the walker with light touch down. He is walking like this down his hallway and back 4 times a day. He will see his orthopedist next week. I have advised him this is my last call and that I am closing his care. I have encouraged him however, to keep my number and he may call me at anytime in the future if he needs assistance.  [facility] CM Care Plan Problem One   Flowsheet Row Most Recent Value  Care Plan Problem One  Inconvenience of immobility (confined to wheelchair) for 3 months.  Role Documenting the Problem One  Care Management [place] for Problem One  Active  [facility] Long Term Goal (31-90 days)  Pt will be able to start weight bearing at the end of 90 days.  [facility] Long Term Goal Start Date  09/24/16  Interventions for Problem One Long Term Goal  Encouraged to follow surgeons recommendations, follow safety precautions, participate fully with home health PT.  [facility] CM Short Term Goal #1 (0-30 days)  Pt will participate in his home health PT appts.  [facility] CM Short Term Goal #1 Start Date  10/02/16  [facility] CM Short Term Goal #1 Met Date  10/25/16  Interventions for Short Term Goal #1  Encouraged 100% effort in home health PT>    [facility] CM Care Plan Problem Two   Flowsheet Row Most Recent Value  Care Plan Problem Two  High risk for falls  Role Documenting the Problem Two  Care Management Coordinator  Care Plan for Problem Two  Active  [facility] CM Short Term Goal #1 (0-30 days)  Pt will not fall over the next 30 days.  [facility] CM Short Term Goal #1 Start Date  09/24/16  [facility] CM Short Term Goal #1 Met Date   10/25/16  Interventions for Short Term Goal #2   Reinforced safety precautions.     Deloria Lair [facility] [phone]

## 2016-10-29 DIAGNOSIS — J449 Chronic obstructive pulmonary disease, unspecified: Secondary | ICD-10-CM | POA: Diagnosis not present

## 2016-10-29 DIAGNOSIS — S82892D Other fracture of left lower leg, subsequent encounter for closed fracture with routine healing: Secondary | ICD-10-CM | POA: Diagnosis not present

## 2016-10-31 ENCOUNTER — Ambulatory Visit: Payer: Medicare Other | Admitting: *Deleted

## 2016-10-31 DIAGNOSIS — S82832D Other fracture of upper and lower end of left fibula, subsequent encounter for closed fracture with routine healing: Secondary | ICD-10-CM | POA: Diagnosis not present

## 2016-10-31 DIAGNOSIS — S82192D Other fracture of upper end of left tibia, subsequent encounter for closed fracture with routine healing: Secondary | ICD-10-CM | POA: Diagnosis not present

## 2016-11-01 DIAGNOSIS — J449 Chronic obstructive pulmonary disease, unspecified: Secondary | ICD-10-CM | POA: Diagnosis not present

## 2016-11-01 DIAGNOSIS — S82892D Other fracture of left lower leg, subsequent encounter for closed fracture with routine healing: Secondary | ICD-10-CM | POA: Diagnosis not present

## 2016-11-15 DIAGNOSIS — R29898 Other symptoms and signs involving the musculoskeletal system: Secondary | ICD-10-CM | POA: Diagnosis not present

## 2016-11-19 DIAGNOSIS — R29898 Other symptoms and signs involving the musculoskeletal system: Secondary | ICD-10-CM | POA: Diagnosis not present

## 2016-11-21 DIAGNOSIS — R29898 Other symptoms and signs involving the musculoskeletal system: Secondary | ICD-10-CM | POA: Diagnosis not present

## 2016-11-24 ENCOUNTER — Other Ambulatory Visit: Payer: Self-pay | Admitting: Nurse Practitioner

## 2016-11-26 DIAGNOSIS — R29898 Other symptoms and signs involving the musculoskeletal system: Secondary | ICD-10-CM | POA: Diagnosis not present

## 2016-12-03 ENCOUNTER — Telehealth: Payer: Self-pay | Admitting: *Deleted

## 2016-12-03 DIAGNOSIS — R29898 Other symptoms and signs involving the musculoskeletal system: Secondary | ICD-10-CM | POA: Diagnosis not present

## 2016-12-03 DIAGNOSIS — S82192D Other fracture of upper end of left tibia, subsequent encounter for closed fracture with routine healing: Secondary | ICD-10-CM | POA: Diagnosis not present

## 2016-12-03 DIAGNOSIS — S82832D Other fracture of upper and lower end of left fibula, subsequent encounter for closed fracture with routine healing: Secondary | ICD-10-CM | POA: Diagnosis not present

## 2016-12-03 NOTE — Telephone Encounter (Signed)
LMVM for pt re: gabapentin request from [facility].  Taking 5 or 6 caps daily?  Has appt 12-25-16 with Dr. Krista Blue wait till then or need now?

## 2016-12-05 DIAGNOSIS — R29898 Other symptoms and signs involving the musculoskeletal system: Secondary | ICD-10-CM | POA: Diagnosis not present

## 2016-12-10 DIAGNOSIS — R29898 Other symptoms and signs involving the musculoskeletal system: Secondary | ICD-10-CM | POA: Diagnosis not present

## 2016-12-12 DIAGNOSIS — R29898 Other symptoms and signs involving the musculoskeletal system: Secondary | ICD-10-CM | POA: Diagnosis not present

## 2016-12-17 DIAGNOSIS — R29898 Other symptoms and signs involving the musculoskeletal system: Secondary | ICD-10-CM | POA: Diagnosis not present

## 2016-12-18 NOTE — Telephone Encounter (Signed)
Will await when pt comes in for RV.

## 2016-12-20 DIAGNOSIS — R29898 Other symptoms and signs involving the musculoskeletal system: Secondary | ICD-10-CM | POA: Diagnosis not present

## 2016-12-24 DIAGNOSIS — R29898 Other symptoms and signs involving the musculoskeletal system: Secondary | ICD-10-CM | POA: Diagnosis not present

## 2016-12-25 ENCOUNTER — Encounter: Payer: Self-pay | Admitting: Neurology

## 2016-12-25 ENCOUNTER — Ambulatory Visit (INDEPENDENT_AMBULATORY_CARE_PROVIDER_SITE_OTHER): Payer: Medicare Other | Admitting: Neurology

## 2016-12-25 VITALS — BP 126/63 | HR 76 | Ht 67.0 in | Wt 252.0 lb

## 2016-12-25 DIAGNOSIS — IMO0001 Reserved for inherently not codable concepts without codable children: Secondary | ICD-10-CM

## 2016-12-25 DIAGNOSIS — G629 Polyneuropathy, unspecified: Secondary | ICD-10-CM | POA: Diagnosis not present

## 2016-12-25 DIAGNOSIS — E1165 Type 2 diabetes mellitus with hyperglycemia: Secondary | ICD-10-CM

## 2016-12-25 MED ORDER — GABAPENTIN 300 MG PO CAPS: ORAL_CAPSULE | ORAL | 4 refills | Status: DC

## 2016-12-25 NOTE — Progress Notes (Signed)
[facility]  PATIENT: Paul [name] DOB: 1941-02-28   REASON FOR VISIT: follow up for peripheral neuralgia, diabetes, obesity HISTORY FROM:patient   HISTORY OF PRESENT ILLNESS:[name] is a 76 -year-old right-handed Caucasian male, came in to followup his peripheral neuropathy, clinical visit was November 2013, He has past medical history of obesity, hypertension, poorly controlled diabetes, flatfeet, presenting with few years history of gradual onset, very slowly progressing, length dependent axonal peripheral neuropathy, which is confirmed by electrodiagnostic study, mild to moderately severe, also complicated by his lifelong history of flatfeet. He continues to do well, with his bilateral feet parethesia, well controlled by Neurontin 300 mg +3 tablets 846m every night. He complains of daytime sleepiness, dozing off while watching TV at 7 clock each evening, he reports previous history of sleep study, decline repeat study. Independent in ADL's, driving without problems. One fall in the past year, going up over a curb, now using cane. No new neurologic complaints. He is taking Neurontin 8069m3 tabs qhs, he is mildly symptomatic of his peripheral neuropathy from his current management, he has bilateral flat feet, mild gait difficulty, he also complains of irregular sleep pattern, he went to bed around 10 PM, after taking his Neurontin 800 mg 3 tablets, he was able to sleep 3-4 hours, afterwards, he needs to use the bathroom, sleep another to 3 hours, he become widely awake at 5 AM, difficulty falling back to sleep again, but after breakfast, he took to 3 hours nap in the morning, he is not exercising regularly   Last hemoglobin A1c was 7. He has a slowly progressing length dependent axonal peripheral neuropathy which was confirmed by electrodiagnostic study. He is currently on gabapentin 1800 mg at bedtime and he has had 2 recent falls getting up first thing in  the morning without his cane. He is wanting to reduce his dose by one capsule see if that makes a difference in his drowsiness on awakening.His neuropathy symptoms are in good control.he returns for reevaluation in 2014  UPDATE Dec 25 2016: He slipped and fell had left tibial and tibial fracture in September 2017 require surgical fixation, require prolonged rehabilitation, no ambulate with a walker  His peripheral neuropathy still stay at the bottom of his feet, he takes gabapentin 300 mg 6 tablets every night which has helped his symptoms    REVIEW OF SYSTEMS: Full 14 system review of systems performed and notable only for those listed, all others are neg:    ALLERGIES: Allergies  Allergen Reactions  . Codeine Anaphylaxis  . Phenobarbital Hypertension    HOME MEDICATIONS: Outpatient Medications Prior to Visit  Medication Sig Dispense Refill  . acetaminophen (TYLENOL) 500 MG tablet Take 1,000 mg by mouth daily with breakfast.    . albuterol (PROAIR HFA) 108 (90 BASE) MCG/ACT inhaler Inhale 2 puffs into the lungs every 2 (two) hours as needed for wheezing or shortness of breath.     . budesonide-formoterol (SYMBICORT) 160-4.5 MCG/ACT inhaler Take 2 puffs first thing in am and then another 2 puffs about 12 hours later. (Patient taking differently: Inhale 2 puffs into the lungs 2 (two) times daily. ) 1 Inhaler 12  . calcium carbonate (OS-CAL) 600 MG TABS tablet Take 600 mg by mouth daily with supper.     . fexofenadine (ALLEGRA) 180 MG tablet Take 180 mg by mouth daily with breakfast.     . fluticasone (FLONASE) 50 MCG/ACT nasal spray Place 1 spray into the nose 2 (two) times  daily.     . gabapentin (NEURONTIN) 300 MG capsule Take 6 capsules by mouth at bedtime (Patient taking differently: Take 1,200 mg by mouth at bedtime. ) 540 capsule 3  . glimepiride (AMARYL) 2 MG tablet Take 2 mg by mouth 2 (two) times daily.     Marland Kitchen guaiFENesin (MUCINEX) 600 MG 12 hr tablet Take 1,200 mg by mouth daily  with breakfast.     . hydrochlorothiazide (MICROZIDE) 12.5 MG capsule Take 12.5 mg by mouth daily.    Marland Kitchen losartan (COZAAR) 50 MG tablet Take 1 tablet (50 mg total) by mouth daily. (Patient taking differently: Take 50 mg by mouth daily with breakfast. ) 90 tablet 0  . metFORMIN (GLUCOPHAGE) 1000 MG tablet Take 1,000 mg by mouth 2 (two) times daily with a meal.      . metoCLOPramide (REGLAN) 10 MG tablet Take 10 mg by mouth 2 (two) times daily.    . Multiple Vitamins-Minerals (CENTRUM SILVER ADULT 50+) TABS Take 1 tablet by mouth daily with breakfast.    . OXYGEN Place 2 L into the nose at bedtime. 2L of oxygen at night and 2L of oxygen when walking long distances    . pioglitazone (ACTOS) 45 MG tablet Take 45 mg by mouth daily with breakfast.     . Vitamin D, Ergocalciferol, (DRISDOL) 50000 UNITS CAPS capsule Take 50,000 Units by mouth every Saturday.     . docusate sodium (COLACE) 100 MG capsule Take 1 capsule (100 mg total) by mouth 2 (two) times daily. HOLD for loose stool or diarrhea (Patient not taking: Reported on 09/24/2016) 10 capsule 0  . enoxaparin (LOVENOX) 40 MG/0.4ML injection Inject 0.4 mLs (40 mg total) into the skin daily. (Patient not taking: Reported on 09/24/2016) 5 Syringe 0  . enoxaparin (LOVENOX) 40 MG/0.4ML injection Inject 0.4 mLs (40 mg total) into the skin daily. 11.2 mL 0  . Fluticasone-Salmeterol (ADVAIR) 250-50 MCG/DOSE AEPB Inhale 1 puff into the lungs 2 (two) times daily.     . methocarbamol (ROBAXIN) 500 MG tablet Take 1 tablet (500 mg total) by mouth every 6 (six) hours as needed for muscle spasms. (Patient not taking: Reported on 09/24/2016) 80 tablet 0  . oxyCODONE (OXY IR/ROXICODONE) 5 MG immediate release tablet Take 1-2 tablets (5-10 mg total) by mouth every 3 (three) hours as needed for moderate pain, severe pain or breakthrough pain. 80 tablet 0  . oxyCODONE (ROXICODONE) 5 MG immediate release tablet Take 1-2 tablets (5-10 mg total) by mouth every 4 (four) hours  as needed for severe pain. 80 tablet 0  . pantoprazole (PROTONIX) 40 MG tablet Take 1 tablet (40 mg total) by mouth daily. (Patient not taking: Reported on 09/24/2016) 30 tablet 0  . polyethylene glycol (MIRALAX / GLYCOLAX) packet Take 17 g by mouth daily.    . pravastatin (PRAVACHOL) 40 MG tablet Take 1 tablet (40 mg total) by mouth daily. (Patient taking differently: Take 40 mg by mouth at bedtime. ) 90 tablet 3   No facility-administered medications prior to visit.     PAST MEDICAL HISTORY: Past Medical History:  Diagnosis Date  . Asthma   . Atrial fibrillation ([place])   . CAD (coronary artery disease)    Prior PTCA  . COPD (chronic obstructive pulmonary disease) ([place])   . Diabetes mellitus   . GERD (gastroesophageal reflux disease)   . Hyperlipidemia   . Hypertension   . lung ca dx'd 01/2006  . Nephrolithiasis   . Osteopenia   . Pulmonary  embolus ([place])   . Tibia/fibula fracture 07/2016   Left  . Tremor, essential     PAST SURGICAL HISTORY: Past Surgical History:  Procedure Laterality Date  . Angioplasty  Approx 1993  . CARDIAC CATHETERIZATION  Approximately 1993  . CHOLECYSTECTOMY    . 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 FIXATION KYPHOPLASTY    . LUNG CANCER SURGERY  01/2006   Small excision of left lung tissue; mesh with radioactive seeds (per pt report)  . ORIF TIBIA FRACTURE Left 08/22/2016   Procedure: OPEN REDUCTION INTERNAL FIXATION (ORIF) TIBIA FRACTURE;  Surgeon: Nicholes Stairs, MD;  Location: [facility];  Service: Orthopedics;  Laterality: Left;  . ROTATOR CUFF TEAR  2016  . Tibia/Fibula Repair Left 07/2016  . TONSILLECTOMY      FAMILY HISTORY: Family History  Problem Relation Age of Onset  . Heart disease Mother   . Tremor Mother   . Dementia Mother   . Heart Problems Father     SOCIAL HISTORY: Social History   Social History  . Marital status: Married    Spouse name: Santiago Glad  . Number of children: 2  . Years of education: college    Occupational History  .  Retired    Retired from [employer] and [employer]  . Smoking status: Current Every Day Smoker    Packs/day: 0.50    Years: 59.00    Types: Cigarettes  . Smokeless tobacco: Never Used  . Alcohol use 0.6 oz/week    1 Glasses of wine per week     Comment: Rare  . Drug use: No  . Sexual activity: No   Other Topics Concern  . Not on file   Social History Narrative   Patient lives at home with his wife Santiago Glad).   Retired.   Education- College    Right handed.   Caffeine- three cups of coffee daily.     PHYSICAL EXAM  Vitals:   12/25/16 1104  BP: 126/63  Pulse: 76  Weight: 252 lb (114.3 kg)  Height: _0  (1.702 m)   Body mass index is 39.47 kg/m. Generalized: In no acute distress, obese male, well-groomed  Neck: Supple, no carotid bruits  Cardiac: Regular rate rhythm Pulmonary: Clear to auscultation bilaterally Musculoskeletal: No deformity  Neurological examination Mentation: Alert oriented to time, place, history taking, and causual conversation Cranial nerve II-XII: Pupils were equal round reactive to light extraocular movements were full, Visual field were full on confrontational test. Bilateral fundi were sharp. Facial sensation and strength were normal. Hearing was intact to finger rubbing bilaterally. Uvula tongue midline. head turning and shoulder shrug and were normal and symmetric.Tongue protrusion into cheek strength was normal. Motor: mild bilateral ankle dorsiflexion weakness. Sensory: length dependent decreased to fine touch, pinprick to midshin, absent toe vibratory sensation, and proprioception at toes. Coordination: Normal finger to nose, heel-to-shin bilaterally there was no truncal ataxia Gait: Rising up from seated position by pushing on chair arm, flat foot, mild bilateral foot drop, unsteady with tandem ,ambulates with cane Romberg signs: Negative Deep tendon reflexes: Brachioradialis 2/2, biceps 2/2,  triceps 2/2, patellar 1/1, Achilles absent, plantar responses were flexor bilaterally. DIAGNOSTIC DATA (LABS, IMAGING, TESTING) - I reviewed patient records, labs, notes, testing and imaging myself where available.    ASSESSMENT AND PLAN 76 y.o. year old male  Peripheral neuropathy Diabetes Gait abnormality  He has mild lateral ankle dorsiflexion weakness, bilateral foot drop, left worse than right, ambulate with  a walker  Continue physical therapy    Marcial Pacas, M.D. Ph.D.  [facility] [place], [place] [address] Phone: [phone] Fax:      [phone]

## 2016-12-27 DIAGNOSIS — R29898 Other symptoms and signs involving the musculoskeletal system: Secondary | ICD-10-CM | POA: Diagnosis not present

## 2016-12-31 DIAGNOSIS — R29898 Other symptoms and signs involving the musculoskeletal system: Secondary | ICD-10-CM | POA: Diagnosis not present

## 2017-01-01 ENCOUNTER — Encounter: Payer: Self-pay | Admitting: Podiatry

## 2017-01-01 ENCOUNTER — Ambulatory Visit (INDEPENDENT_AMBULATORY_CARE_PROVIDER_SITE_OTHER): Payer: Medicare Other | Admitting: Podiatry

## 2017-01-01 DIAGNOSIS — M79609 Pain in unspecified limb: Secondary | ICD-10-CM

## 2017-01-01 DIAGNOSIS — B351 Tinea unguium: Secondary | ICD-10-CM

## 2017-01-01 DIAGNOSIS — E1140 Type 2 diabetes mellitus with diabetic neuropathy, unspecified: Secondary | ICD-10-CM

## 2017-01-01 NOTE — Patient Instructions (Signed)

## 2017-01-01 NOTE — Progress Notes (Signed)
Patient ID: Paul [name], male   DOB: 25-Aug-1941, 76 y.o.   MRN: [id number]   Subjective: This patient presents again complaining of toenails are uncomfortable walking wearing shoes and requests toenail debridement. Patient has a history of fracture of left leg resulting in surgical reduction and physical therapy in patient. At this time patient is able to walk slowly with a walker and is improving over time Patient is diabetic with a history of neuropathy  Objective:  Orientated 3  Vascular: Mild pitting edema left No calf pain or calf edema bilaterally DP pulses 2/4 bilaterally PT pulses 2/4 bilaterally Capillary reflex immediate bilaterally  Neurological: Sensation to 10 g monofilament wire intact 1/5 right 2/5 left Vibratory sensation nonreactive bilaterally Ankle reflex equal and reactive bilaterally  Dermatological: No open skin lesions bilaterally Plantar callus base first metatarsocuneiform without any bleeding within callus there is no erythema or warmth in this callused area The toenails are discolored, brittle, deformed, elongated and tender to direct palpation   Musculoskeletal: Upon weight-bearing patient has extreme pes planus with the forefoot significantly abducted on the rear foot bilaterally Patient has slow unstable gait with maximum hyperpronation throughout the gait cycle using a walker There is palpable tenderness in or around the base of first metatarsal cuneiform which seems to duplicate patient's area of discomfort Patient has diabetic shoes with custom insoles that contour well and in a good state of repair Manual motor testing plantar flexion 5/5 bilaterally Dorsi flexion 4/5 right and 3/5 left  Assessment: Satisfactory vascular status Diabetic Sensory and motor neuropathy Hyperpronation Gait disturbance  Plan: Debridement of toenails 6-10 mechanically without any bleeding  Reappoint 3 months

## 2017-01-03 DIAGNOSIS — R29898 Other symptoms and signs involving the musculoskeletal system: Secondary | ICD-10-CM | POA: Diagnosis not present

## 2017-01-07 DIAGNOSIS — R29898 Other symptoms and signs involving the musculoskeletal system: Secondary | ICD-10-CM | POA: Diagnosis not present

## 2017-01-10 DIAGNOSIS — R29898 Other symptoms and signs involving the musculoskeletal system: Secondary | ICD-10-CM | POA: Diagnosis not present

## 2017-01-14 DIAGNOSIS — R29898 Other symptoms and signs involving the musculoskeletal system: Secondary | ICD-10-CM | POA: Diagnosis not present

## 2017-01-17 DIAGNOSIS — R29898 Other symptoms and signs involving the musculoskeletal system: Secondary | ICD-10-CM | POA: Diagnosis not present

## 2017-01-22 DIAGNOSIS — S82192D Other fracture of upper end of left tibia, subsequent encounter for closed fracture with routine healing: Secondary | ICD-10-CM | POA: Diagnosis not present

## 2017-01-22 DIAGNOSIS — S82832D Other fracture of upper and lower end of left fibula, subsequent encounter for closed fracture with routine healing: Secondary | ICD-10-CM | POA: Diagnosis not present

## 2017-01-22 DIAGNOSIS — Z4789 Encounter for other orthopedic aftercare: Secondary | ICD-10-CM | POA: Diagnosis not present

## 2017-01-22 NOTE — Progress Notes (Signed)
HPI: FU CAD and atrial fibrillation. Patient states had PCI of LAD in 1988. Patient previously admitted with a pulmonary embolus. During hospitalization he developed atrial fibrillation with a rapid ventricular response. Echocardiogram in May of 2013 showed normal LV function and mild left ventricular hypertrophy. Nuclear study 3/17 showed EF 65, attenuation artifact and no ischemia. Since I last saw him, he has some dyspnea on exertion unchanged. No orthopnea or PND. No chest pain or syncope.  Current Outpatient Prescriptions  Medication Sig Dispense Refill  . acetaminophen (TYLENOL) 500 MG tablet Take 1,000 mg by mouth every 4 (four) hours as needed.     Marland Kitchen albuterol (PROAIR HFA) 108 (90 BASE) MCG/ACT inhaler Inhale 2 puffs into the lungs every 2 (two) hours as needed for wheezing or shortness of breath.     Marland Kitchen aspirin 81 MG tablet Take 81 mg by mouth daily.    . budesonide-formoterol (SYMBICORT) 160-4.5 MCG/ACT inhaler Take 2 puffs first thing in am and then another 2 puffs about 12 hours later. 3 Inhaler 3  . calcium carbonate (OS-CAL) 600 MG TABS tablet Take 600 mg by mouth daily with supper.     Marland Kitchen dextromethorphan (DELSYM) 30 MG/5ML liquid Take by mouth as needed for cough.    . fexofenadine (ALLEGRA) 180 MG tablet Take 180 mg by mouth daily with breakfast.     . fluticasone (FLONASE) 50 MCG/ACT nasal spray Place 1 spray into the nose 2 (two) times daily.     . furosemide (LASIX) 20 MG tablet Take 1 tablet (20 mg total) by mouth daily. 30 tablet 0  . gabapentin (NEURONTIN) 300 MG capsule Take 6 capsules by mouth at bedtime 540 capsule 4  . glimepiride (AMARYL) 2 MG tablet Take 2 mg by mouth 2 (two) times daily.     Marland Kitchen guaiFENesin (MUCINEX) 600 MG 12 hr tablet Take 1,200 mg by mouth daily with breakfast.     . levofloxacin (LEVAQUIN) 500 MG tablet Take 1 tablet (500 mg total) by mouth daily. 7 tablet 1  . losartan (COZAAR) 50 MG tablet Take 1 tablet (50 mg total) by mouth daily. (Patient  taking differently: Take 50 mg by mouth daily with breakfast. ) 90 tablet 0  . metFORMIN (GLUCOPHAGE) 1000 MG tablet Take 1,000 mg by mouth 2 (two) times daily with a meal.      . metoCLOPramide (REGLAN) 10 MG tablet Take 10 mg by mouth 2 (two) times daily.    . Multiple Vitamins-Minerals (CENTRUM SILVER ADULT 50+) TABS Take 1 tablet by mouth daily with breakfast.    . OXYGEN Place 2 L into the nose at bedtime. 2L of oxygen at night and 2L of oxygen when walking long distances    . pioglitazone (ACTOS) 45 MG tablet Take 45 mg by mouth daily with breakfast.     . Tiotropium Bromide Monohydrate (SPIRIVA RESPIMAT) 2.5 MCG/ACT AERS 2 puffs each am 1 Inhaler 11  . Vitamin D, Ergocalciferol, (DRISDOL) 50000 UNITS CAPS capsule Take 50,000 Units by mouth every Saturday.      No current facility-administered medications for this visit.      Past Medical History:  Diagnosis Date  . Asthma   . Atrial fibrillation ([place])   . CAD (coronary artery disease)    Prior PTCA  . COPD (chronic obstructive pulmonary disease) ([place])   . Diabetes mellitus   . GERD (gastroesophageal reflux disease)   . Hyperlipidemia   . Hypertension   . lung ca dx'd  01/2006  . Nephrolithiasis   . Osteopenia   . Pulmonary embolus ([place])   . Tibia/fibula fracture 07/2016   Left  . Tremor, essential     Past Surgical History:  Procedure Laterality Date  . Angioplasty  Approx 1993  . CARDIAC CATHETERIZATION  Approximately 1993  . CHOLECYSTECTOMY    . 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 FIXATION KYPHOPLASTY    . LUNG CANCER SURGERY  01/2006   Small excision of left lung tissue; mesh with radioactive seeds (per pt report)  . ORIF TIBIA FRACTURE Left 08/22/2016   Procedure: OPEN REDUCTION INTERNAL FIXATION (ORIF) TIBIA FRACTURE;  Surgeon: Nicholes Stairs, MD;  Location: [facility];  Service: Orthopedics;  Laterality: Left;  . ROTATOR CUFF TEAR  2016  . Tibia/Fibula Repair Left 07/2016  . TONSILLECTOMY      Social  History   Social History  . Marital status: Married    Spouse name: Santiago Glad  . Number of children: 2  . Years of education: college   Occupational History  .  Retired    Retired from [employer] and [employer]  . Smoking status: Current Every Day Smoker    Packs/day: 0.50    Years: 59.00    Types: Cigarettes  . Smokeless tobacco: Never Used  . Alcohol use 0.6 oz/week    1 Glasses of wine per week     Comment: Rare  . Drug use: No  . Sexual activity: No   Other Topics Concern  . Not on file   Social History Narrative   Patient lives at home with his wife Santiago Glad).   Retired.   Education- College    Right handed.   Caffeine- three cups of coffee daily.    Family History  Problem Relation Age of Onset  . Heart disease Mother   . Tremor Mother   . Dementia Mother   . Heart Problems Father     ROS: Some residual pain in left lower extremity from previous fracture but no fevers or chills, productive cough, hemoptysis, dysphasia, odynophagia, melena, hematochezia, dysuria, hematuria, rash, seizure activity, orthopnea, PND, pedal edema, claudication. Remaining systems are negative.  Physical Exam: Well-developed obese in no acute distress.  Skin is warm and dry.  HEENT is normal.  Neck is supple.  Chest is clear to auscultation with normal expansion.  Cardiovascular exam is regular rate and rhythm.  Abdominal exam nontender or distended. No masses palpated. Extremities show no edema. neuro grossly intact  ECG- Sinus rhythm with occasional PACs. 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 personally reviewed  A/P  1 Coronary artery disease-continue aspirin. Resume statin.  2 hyperlipidemia-patient had some myalgias with Pravachol. We will try Lipitor 40 mg daily. Check lipids and liver in 4 weeks.  3 tobacco abuse-patient counseled on discontinuing.  4 hypertension-blood pressure controlled. Continue present medications.  5 history of  paroxysmal atrial fibrillation-patient remains in sinus rhythm. His previous episode of atrial fibrillation occurred in the setting of a pulmonary embolus.    Kirk Ruths, MD

## 2017-01-24 ENCOUNTER — Encounter: Payer: Self-pay | Admitting: Cardiology

## 2017-01-24 DIAGNOSIS — R29898 Other symptoms and signs involving the musculoskeletal system: Secondary | ICD-10-CM | POA: Diagnosis not present

## 2017-01-29 ENCOUNTER — Ambulatory Visit (INDEPENDENT_AMBULATORY_CARE_PROVIDER_SITE_OTHER)
Admission: RE | Admit: 2017-01-29 | Discharge: 2017-01-29 | Disposition: A | Discharge status: Home / Self Care | Payer: Medicare Other | Source: Ambulatory Visit | Attending: Internal Medicine | Admitting: Internal Medicine

## 2017-01-29 ENCOUNTER — Encounter: Payer: Self-pay | Admitting: Internal Medicine

## 2017-01-29 ENCOUNTER — Ambulatory Visit (INDEPENDENT_AMBULATORY_CARE_PROVIDER_SITE_OTHER): Payer: Medicare Other | Admitting: Internal Medicine

## 2017-01-29 VITALS — BP 122/74 | HR 62 | Ht 67.0 in | Wt 250.0 lb

## 2017-01-29 DIAGNOSIS — R05 Cough: Secondary | ICD-10-CM | POA: Diagnosis not present

## 2017-01-29 DIAGNOSIS — J449 Chronic obstructive pulmonary disease, unspecified: Secondary | ICD-10-CM

## 2017-01-29 DIAGNOSIS — F17210 Nicotine dependence, cigarettes, uncomplicated: Secondary | ICD-10-CM

## 2017-01-29 MED ORDER — LEVOFLOXACIN 500 MG PO TABS: 500.0000 mg | ORAL_TABLET | Freq: Every day | ORAL | 1 refills | Status: DC

## 2017-01-29 MED ORDER — TIOTROPIUM BROMIDE MONOHYDRATE 2.5 MCG/ACT IN AERS: INHALATION_SPRAY | RESPIRATORY_TRACT | 11 refills | Status: DC

## 2017-01-29 MED ORDER — BUDESONIDE-FORMOTEROL FUMARATE 160-4.5 MCG/ACT IN AERO: INHALATION_SPRAY | RESPIRATORY_TRACT | 3 refills | Status: DC

## 2017-01-29 NOTE — Progress Notes (Signed)
Subjective:    Patient ID: Paul [name], male    DOB: 10/14/1941  MRN: [id number]    Brief patient profile:  83 yowm active smoker with dx of copd with worse symptoms since 2013 referred to pulmonary clinic 12/24/2012 by Dr Darcus Austin for COPD evaluation with GOLD II criteria May 2014    History of Present Illness  12/24/2012 1st pulmonary eval  On advair/ spiriva longterm with freq use of proventil prior to dx  PE presenting as pleurtic L CP May 2013 with "moderate clot burden" to  [employer] assoc with PAF and started on Xarelto and baseline doe x climbing steps but could walk flat all day long before and after PE but then indolent onset doe progressively worse x 9 months assoc with cough congestion > green mucus better p on levaquin x one week but convinced his coughing and breathing problems are all due to xarelto because of what he read on the package. rec Only use your albuterol (proaire) as a rescue medication (plan B)  to be used if you can't catch your breath   The key is to stop smoking completely before smoking completely stops you     02/05/2013 f/u ov/Paul [name] cc need less albuterol maint on advair and spiriva with worse cough and sob x mailbox and back and collapse in chair.   Mucus turned green again x 2 weeks prior to OV   rec Stop advair and lisinopril Start symbicort 160 Take 2 puffs first thing in am and then another 2 puffs about 12 hours later.  Only use your albuterol(proaire)  as a rescue medication to be used if you can't catch your breath by resting or doing a relaxed purse lip breathing pattern. The less you use it, the better it will work when you need it.  Prednisone 10 mg take  4 each am x 2 days,   2 each am x 2 days,  1 each am x2days and stop  Levaquin 750 x 5 days For cough mucinex dm best as needed  03/26/2013 f/u ov/Paul [name] re copd Chief Complaint  Patient presents with  . Follow-up    Breathing is unchanged since the last visit. Ran out of symbicort so started  back on advair about 2 wks ago. He could not tell difference between the 2 meds, but spouse noted he did not cough as much on symbicort.  mucus is more day than night, usually not green rec Ok to stop spiriva Work on [employer], technical sales technique:      06/21/2014 f/u ov/Paul [name] re: GOLD II COPD / still smoking, concerned re side effects of meds  Chief Complaint  Patient presents with  . Follow-up    Pt states had six minute walk test which was abnormal. He states that he was referred here to discuss this per Dr Darcus Austin. Pt was last seen here in May 2014 and states that his breathing is unchanged since then.     lots of phlegm and congestion each am and occ wakes at 3 am but mostly daytime. 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 Follow-up    Pt states that his cough and SOB are unchanged since the last visit. No new co's today.    no proair  Day of ov/ pacing himself and no longer doing steps so less sob/ less saba need - rattling cough esp in am, thick, slt green, not on max dose mucinex but seems to helps whereas even levaquin did not >> no changes , declined O2 .   03/07/2015 Follow up COPD /still smoking  Patient returns for a six-month follow-up. He says overall he has been doing okay He is currently a pulmonary rehabilitation and feels that it is really helping him. His O2 saturations do drop with exercise and he uses oxygen at pulmonary rehabilitation [facility] , he wears O2 with exercise and does much better.  O2 sat at rest are 95% walking. Today in office with drop with O2 saturation 88% on room  air. Patient denies any chest pain, palpitations, orthopnea, PND, or increased leg swelling. He does request that his oxygen orders be sent to [facility]  and is looking at the  portable concentrator unit-[employer]ogen.  He continues to smoke. We discussed cessation. He says he has no interest in quitting smoking. We discussed the dangers of oxygen and smoking. Patient was noted to have oxygen desaturations. Last visit and declined oxygen but now says that he is rated begin oxygen. rec Continue on current regimen  Work on not smoking  Begin Oxygen 2l/m with activity  Order sent to [place] for evaluation of portable concentrator >  [employer]ogen 2lpm pulse and 3lpm pulse when walk     08/01/2016  f/u ov/Paul [name] re: GOLD 2 still smoking/ symb 160 just one bid  Chief Complaint  Patient presents with  . Follow-up    74morov. PFT results. pt states breathing is baseline, pt c/o sob w/exertion, prod cough w/green mucus & wheezing at times.  using sev saba's daily but not noct  Not using 02 as rec  Not using sym as rec  MMRC3 no change  rec Plan A = Automatic = symbicort 160 Take 2 puffs first thing in am and then another 2 puffs about 12 hours later.  Plan B = Backup Only use your albuterol (proair) as a rescue medication    01/29/2017  f/u ov/Paul [name] re:  GOLD II COPD  On 3lpm with any distance and o/w just using 3lpm hs plus symb 1602bid  Chief Complaint  Patient presents with  . Follow-up    Pt c/o prod cough with green sputum x 4 days. He states his breathing is overall doing well.    much worse since 01/26/17 but prior to that avg twice daily rescue and much more saba need  with onset of coughing fits prod of green mucus just in the last 4 d prior to OV    No obvious patterns in day to day or daytime variability or assoc  or mucus plugs or hemoptysis or cp or chest tightness, subjective wheeze or overt sinus or hb symptoms. No unusual exp hx or h/o childhood pna/ asthma or knowledge of premature  birth.  Sleeping ok without nocturnal  or early am exacerbation  of respiratory  c/o's or need for noct saba. Also denies any obvious fluctuation of symptoms with weather or environmental changes or other aggravating or alleviating factors except as outlined above   Current Medications, Allergies, Complete Past Medical History, Past Surgical History, Family History, and Social History  were reviewed in [facility] record.  ROS  The following are not active complaints unless bolded sore throat, dysphagia, dental problems, itching, sneezing,  nasal congestion or excess/ purulent secretions, ear ache,   fever, chills, sweats, unintended wt loss, classically pleuritic or exertional cp,  orthopnea pnd or leg swelling, presyncope, palpitations, abdominal pain, anorexia, nausea, vomiting, diarrhea  or change in bowel or bladder habits, change in stools or urine, dysuria,hematuria,  rash, arthralgias, visual complaints, headache, numbness, weakness or ataxia or problems with walking or coordination,  change in mood/affect or memory.               Objective:   Physical Exam  02/05/2013  Wt 266 > 03/26/2013  269 > 06/21/2014 261 > 08/04/2014  256 >257 03/07/2015 > 09/05/2015  254 > 01/26/2016  250 > 08/01/2016  248 > 01/29/2017   250     amb obese hoarse wm / vital signs reviewed  - sats 98% on on 3lpm Pulsed POC  HEENT: nl dentition, turbinates bilaterally, and oropharynx. Nl external ear canals without cough reflex   NECK :  without JVD/Nodes/TM/ nl carotid upstrokes bilaterally   LUNGS: no acc muscle use,  Nl contour chest  With insp and exp rhonchi bilaterally    CV:  RRR  no s3 or murmur or increase in P2, and no edema   ABD:  soft and nontender with nl inspiratory excursion in the supine position. No bruits or organomegaly appreciated, bowel sounds nl  MS:  Nl gait/ ext warm without deformities, calf tenderness, cyanosis or clubbing No obvious joint restrictions   SKIN:  warm and dry without lesions    NEURO:  alert, approp, nl sensorium with  no motor or cerebellar deficits apparent.       CXR PA and Lateral:   01/29/2017 :    I personally reviewed images and agree with radiology impression as follows:   1. Cardiomegaly with mild pulmonary vascular prominence and minimal basilar interstitial prominence. Very mild CHF cannot be excluded.              Assessment & Plan:

## 2017-01-29 NOTE — Patient Instructions (Addendum)
Plan A = Automatic = symbicort Take 2 puffs first thing in am and then another 2 puffs about 12 hours later and add spiriva 2 pffs each am   Levaquin 500 mg daily x 7 days and repeat if cough not resolved     Plan B = Backup Only use your albuterol as a rescue medication to be used if you can't catch your breath by resting or doing a relaxed purse lip breathing pattern.  - The less you use it, the better it will work when you need it. - Ok to use the inhaler up to 2 puffs  every 4 hours if you must but call for appointment if use goes up over your usual need - Don't leave home without it !!  (think of it like the spare tire for your car)   Plan C = Crisis - only use your albuterol nebulizer if you first try Plan B and it fails to help > ok to use the nebulizer up to every 4 hours but if start needing it regularly call for immediate appointment  Please remember to go to the   x-ray department downstairs in the basement  for your tests - we will call you with the results when they are available.     Please schedule a follow up visit in 3 months but call sooner if needed   Late add:  ? Mild edema > add lasix 20 mg daily ? Needs off actos? > Follow up per Primary Care planned

## 2017-01-30 ENCOUNTER — Other Ambulatory Visit: Payer: Self-pay | Admitting: Internal Medicine

## 2017-01-30 MED ORDER — FUROSEMIDE 20 MG PO TABS: 20.0000 mg | ORAL_TABLET | Freq: Every day | ORAL | 0 refills | Status: DC

## 2017-01-30 NOTE — Assessment & Plan Note (Signed)

## 2017-01-30 NOTE — Assessment & Plan Note (Signed)
Complicated by dm, hbp/ hyperlipidemia  Body mass index is 39.16 kg/m.  trending up Lab Results  Component Value Date   TSH 1.91 05/26/2012     Contributing to gerd risk/ doe/reviewed the need and the process to achieve and maintain neg calorie balance > defer f/u primary care including intermittently monitoring thyroid status

## 2017-01-30 NOTE — Progress Notes (Signed)
Spoke with pt and notified of results per Dr. Wert. Pt verbalized understanding and denied any questions. 

## 2017-01-30 NOTE — Assessment & Plan Note (Signed)
-  PFT's 03/12/13 FEV1  2.27 (79%) ratio 52 and no better p B2 DLCO 45 and 60% p correction - Ex desats documented 06/21/14 > refused 02 > agreed to accept 03/26/15 see chronic resp failure  - 01/26/2016  ry symbicort instead of advair - 04/30/2016  After extensive coaching HFA effectiveness =    90%  - PFT's  08/01/2016  FEV1 2.03  (76 % ) ratio 57  p 8 % improvement from saba p symbicort 160 x one puff  prior to study with DLCO  37/39 % corrects to 42  % for alv volume  > increase symb 160 2bid  - added spiriva [facility]  01/29/2017   DDX of  difficult airways management almost all start with A and  include Adherence, Ace Inhibitors, Acid Reflux, Active Sinus Disease, Alpha 1 Antitripsin deficiency, Anxiety masquerading as Airways dz,  ABPA,  Allergy(esp in young), Aspiration (esp in elderly), Adverse effects of meds,  Active smokers, A bunch of PE's (a small clot burden can't cause this syndrome unless there is already severe underlying pulm or vascular dz with poor reserve) plus two Bs  = Bronchiectasis and Beta blocker use..and one C= CHF   Adherence is always the initial "prime suspect" and is a multilayered concern that requires a "trust but verify" approach in every patient - starting with knowing how to use medications, especially inhalers, correctly, keeping up with refills and understanding the fundamental difference between maintenance and prns vs those medications only taken for a very short course and then stopped and not refilled.  - - The proper method of use, as well as anticipated side effects, of a metered-dose inhaler are discussed and demonstrated to the patient using [facility] practice device > try spiriva respimat 2 each am   Active smoking top of the list of suspects  ? Active Sinus dz/ bronchitis > levaquin should cover   ? chf >  On actos with subtle evidence of edema so rec add lasix so and consider stopping actos > Follow up per Primary Care planned     I had an extended discussion with  the patient reviewing all relevant studies completed to date and  lasting 15 to 20 minutes of a 25 minute visit    Each maintenance medication was reviewed in detail including most importantly the difference between maintenance and prns and under what circumstances the prns are to be triggered using an action plan format that is not reflected in the computer generated alphabetically organized AVS.    Please see AVS for specific instructions unique to this visit that I personally wrote and verbalized to the the pt in detail and then reviewed with pt  by my nurse highlighting any  changes in therapy recommended at today's visit to their plan of care.

## 2017-01-31 DIAGNOSIS — R29898 Other symptoms and signs involving the musculoskeletal system: Secondary | ICD-10-CM | POA: Diagnosis not present

## 2017-02-04 ENCOUNTER — Encounter: Payer: Self-pay | Admitting: Cardiology

## 2017-02-04 ENCOUNTER — Ambulatory Visit (INDEPENDENT_AMBULATORY_CARE_PROVIDER_SITE_OTHER): Payer: Medicare Other | Admitting: Cardiology

## 2017-02-04 VITALS — BP 136/60 | HR 80 | Ht 67.0 in | Wt 248.0 lb

## 2017-02-04 DIAGNOSIS — R29898 Other symptoms and signs involving the musculoskeletal system: Secondary | ICD-10-CM | POA: Diagnosis not present

## 2017-02-04 DIAGNOSIS — E7800 Pure hypercholesterolemia, unspecified: Secondary | ICD-10-CM

## 2017-02-04 DIAGNOSIS — I10 Essential (primary) hypertension: Secondary | ICD-10-CM | POA: Diagnosis not present

## 2017-02-04 DIAGNOSIS — I4891 Unspecified atrial fibrillation: Secondary | ICD-10-CM | POA: Diagnosis not present

## 2017-02-04 DIAGNOSIS — I2510 Atherosclerotic heart disease of native coronary artery without angina pectoris: Secondary | ICD-10-CM | POA: Diagnosis not present

## 2017-02-04 MED ORDER — ATORVASTATIN CALCIUM 40 MG PO TABS: 40.0000 mg | ORAL_TABLET | Freq: Every day | ORAL | 11 refills | Status: DC

## 2017-02-04 NOTE — Patient Instructions (Signed)
Medication Instructions:   START ATORVASTATIN 40 MG ONCE DAILY  Labwork:  Your physician recommends that you return for lab work in: 4 WEEKS= DO NOT EAT PRIOR TO LAB WORK  Follow-Up:  Your physician wants you to follow-up in: [place] will receive a reminder letter in the mail two months in advance. If you don't receive a letter, please call our office to schedule the follow-up appointment.   If you need a refill on your cardiac medications before your next appointment, please call your pharmacy.

## 2017-02-06 DIAGNOSIS — D692 Other nonthrombocytopenic purpura: Secondary | ICD-10-CM | POA: Diagnosis not present

## 2017-02-06 DIAGNOSIS — Z794 Long term (current) use of insulin: Secondary | ICD-10-CM | POA: Diagnosis not present

## 2017-02-06 DIAGNOSIS — I10 Essential (primary) hypertension: Secondary | ICD-10-CM | POA: Diagnosis not present

## 2017-02-06 DIAGNOSIS — Z720 Tobacco use: Secondary | ICD-10-CM | POA: Diagnosis not present

## 2017-02-06 DIAGNOSIS — E1165 Type 2 diabetes mellitus with hyperglycemia: Secondary | ICD-10-CM | POA: Diagnosis not present

## 2017-02-07 DIAGNOSIS — R29898 Other symptoms and signs involving the musculoskeletal system: Secondary | ICD-10-CM | POA: Diagnosis not present

## 2017-02-14 DIAGNOSIS — R29898 Other symptoms and signs involving the musculoskeletal system: Secondary | ICD-10-CM | POA: Diagnosis not present

## 2017-02-18 DIAGNOSIS — R29898 Other symptoms and signs involving the musculoskeletal system: Secondary | ICD-10-CM | POA: Diagnosis not present

## 2017-02-20 DIAGNOSIS — M25561 Pain in right knee: Secondary | ICD-10-CM | POA: Diagnosis not present

## 2017-02-20 DIAGNOSIS — G8929 Other chronic pain: Secondary | ICD-10-CM | POA: Diagnosis not present

## 2017-02-20 DIAGNOSIS — M1711 Unilateral primary osteoarthritis, right knee: Secondary | ICD-10-CM | POA: Diagnosis not present

## 2017-02-21 DIAGNOSIS — R29898 Other symptoms and signs involving the musculoskeletal system: Secondary | ICD-10-CM | POA: Diagnosis not present

## 2017-02-22 DIAGNOSIS — R109 Unspecified abdominal pain: Secondary | ICD-10-CM | POA: Diagnosis not present

## 2017-03-04 DIAGNOSIS — I2510 Atherosclerotic heart disease of native coronary artery without angina pectoris: Secondary | ICD-10-CM | POA: Diagnosis not present

## 2017-03-04 LAB — LIPID PANEL
Cholesterol: 132 mg/dL (ref <200)
HDL: 72 mg/dL (ref >40)
LDL Cholesterol: 47 mg/dL (ref <100)
Total CHOL/HDL Ratio: 1.8 Ratio (ref <5.0)
Triglycerides: 64 mg/dL (ref <150)
VLDL: 13 mg/dL (ref <30)

## 2017-03-04 LAB — HEPATIC FUNCTION PANEL
ALT: 17 U/L (ref 9–46)
AST: 15 U/L (ref 10–35)
Albumin: 4 g/dL (ref 3.6–5.1)
Alkaline Phosphatase: 47 U/L (ref 40–115)
Bilirubin, Direct: 0.1 mg/dL
Indirect Bilirubin: 0.4 mg/dL (ref 0.2–1.2)
Total Bilirubin: 0.5 mg/dL (ref 0.2–1.2)
Total Protein: 6.6 g/dL (ref 6.1–8.1)

## 2017-03-06 ENCOUNTER — Telehealth: Payer: Self-pay | Admitting: Internal Medicine

## 2017-03-06 DIAGNOSIS — Z4789 Encounter for other orthopedic aftercare: Secondary | ICD-10-CM | POA: Diagnosis not present

## 2017-03-06 DIAGNOSIS — S82192D Other fracture of upper end of left tibia, subsequent encounter for closed fracture with routine healing: Secondary | ICD-10-CM | POA: Diagnosis not present

## 2017-03-06 DIAGNOSIS — M1712 Unilateral primary osteoarthritis, left knee: Secondary | ICD-10-CM | POA: Diagnosis not present

## 2017-03-06 DIAGNOSIS — J449 Chronic obstructive pulmonary disease, unspecified: Secondary | ICD-10-CM

## 2017-03-06 MED ORDER — TIOTROPIUM BROMIDE MONOHYDRATE 2.5 MCG/ACT IN AERS: INHALATION_SPRAY | RESPIRATORY_TRACT | 0 refills | Status: DC

## 2017-03-06 MED ORDER — FUROSEMIDE 20 MG PO TABS: 20.0000 mg | ORAL_TABLET | Freq: Every day | ORAL | 0 refills | Status: DC

## 2017-03-06 NOTE — Telephone Encounter (Signed)
Spoke with the pt  He is asking for refills on lasix and spiriva  Rxs were sent and nothing further needed at this time per pt

## 2017-03-14 DIAGNOSIS — R29898 Other symptoms and signs involving the musculoskeletal system: Secondary | ICD-10-CM | POA: Diagnosis not present

## 2017-03-18 ENCOUNTER — Encounter: Payer: Self-pay | Admitting: Neurology

## 2017-03-19 ENCOUNTER — Telehealth: Payer: Self-pay | Admitting: *Deleted

## 2017-03-19 DIAGNOSIS — E1165 Type 2 diabetes mellitus with hyperglycemia: Secondary | ICD-10-CM | POA: Diagnosis not present

## 2017-03-19 DIAGNOSIS — J449 Chronic obstructive pulmonary disease, unspecified: Secondary | ICD-10-CM | POA: Diagnosis not present

## 2017-03-19 DIAGNOSIS — Z86010 Personal history of colonic polyps: Secondary | ICD-10-CM | POA: Diagnosis not present

## 2017-03-19 DIAGNOSIS — Z7984 Long term (current) use of oral hypoglycemic drugs: Secondary | ICD-10-CM | POA: Diagnosis not present

## 2017-03-19 DIAGNOSIS — Z6839 Body mass index (BMI) 39.0-39.9, adult: Secondary | ICD-10-CM | POA: Diagnosis not present

## 2017-03-19 DIAGNOSIS — F17210 Nicotine dependence, cigarettes, uncomplicated: Secondary | ICD-10-CM | POA: Diagnosis not present

## 2017-03-19 NOTE — Telephone Encounter (Signed)
Dr. Krista Blue has provided rx for bilateral AFO braces.  He will take the prescription to [facility].  It has been placed up front for pick up.

## 2017-03-19 NOTE — Telephone Encounter (Signed)
Email received from patient:  Hey Doctor Krista Blue,  You mentioned that I had a drop foot.  Is there a brace you can prescripe to fix the problem.  I was in Physical Therapy & they saw it but told me I have to come back to you.  Thanks,  Paul [name] DOB 09/18/1941

## 2017-03-26 DIAGNOSIS — M1711 Unilateral primary osteoarthritis, right knee: Secondary | ICD-10-CM | POA: Diagnosis not present

## 2017-03-26 DIAGNOSIS — M1712 Unilateral primary osteoarthritis, left knee: Secondary | ICD-10-CM | POA: Diagnosis not present

## 2017-03-26 DIAGNOSIS — Z86010 Personal history of colonic polyps: Secondary | ICD-10-CM | POA: Diagnosis not present

## 2017-03-26 DIAGNOSIS — Z4789 Encounter for other orthopedic aftercare: Secondary | ICD-10-CM | POA: Diagnosis not present

## 2017-03-26 DIAGNOSIS — S82192D Other fracture of upper end of left tibia, subsequent encounter for closed fracture with routine healing: Secondary | ICD-10-CM | POA: Diagnosis not present

## 2017-04-02 ENCOUNTER — Ambulatory Visit (INDEPENDENT_AMBULATORY_CARE_PROVIDER_SITE_OTHER): Payer: Medicare Other | Admitting: Podiatry

## 2017-04-02 ENCOUNTER — Encounter: Payer: Self-pay | Admitting: Podiatry

## 2017-04-02 DIAGNOSIS — M79609 Pain in unspecified limb: Secondary | ICD-10-CM | POA: Diagnosis not present

## 2017-04-02 DIAGNOSIS — E1140 Type 2 diabetes mellitus with diabetic neuropathy, unspecified: Secondary | ICD-10-CM

## 2017-04-02 DIAGNOSIS — B351 Tinea unguium: Secondary | ICD-10-CM

## 2017-04-02 NOTE — Progress Notes (Signed)
Patient ID: Paul [name], male   DOB: 1941/08/13, 76 y.o.   MRN: [id number]    Subjective: This patient presents again complaining of toenails are uncomfortable walking wearing shoes and requests toenail debridement. Patient has a history of fracture of left leg resulting in surgical reduction and physical therapy in patient. At this time patient is able to walk slowly with a walker and is improving over time Patient is diabetic with a history of neuropathy  Objective:  Orientated 3  Vascular: Mild pitting edema left No calf pain or calf edema bilaterally DP pulses 2/4 bilaterally PT pulses 2/4 bilaterally Capillary reflex immediate bilaterally  Neurological: Sensation to 10 g monofilament wire intact 1/5 right 2/5 left Vibratory sensation nonreactive bilaterally Ankle reflex equal and reactive bilaterally  Dermatological: No open skin lesions bilaterally Plantar callus base first metatarsocuneiform without any bleeding within callus there is no erythema or warmth in this callused area The toenails are discolored, brittle, deformed, elongated and tender to direct palpation   Musculoskeletal: Upon weight-bearing patient has extreme pes planus with the forefoot significantly abducted on the rear foot bilaterally Patient has slow unstable gait with maximum hyperpronation throughout the gait cycle using a walker There is palpable tenderness in or around the base of first metatarsal cuneiform which seems to duplicate patient's area of discomfort Patient has diabetic shoes with custom insoles that contour well and in a good state of repair Manual motor testing plantar flexion 5/5 bilaterally Dorsi flexion 4/5 right and 3/5 left  Assessment: Satisfactory vascular status Diabetic Sensory and motor neuropathy Hyperpronation Gait disturbance Mild dropfoot left  Plan: Debridement of toenails 6-10 mechanically without any bleeding Patient ambulates with a roller  walker Patient's neurologist recommended dropfoot brace. Patient discussed dropfoot brace with [facility], however, decided against a dropfoot brace for the left  Reappoint 3 months

## 2017-04-02 NOTE — Patient Instructions (Signed)

## 2017-04-17 ENCOUNTER — Other Ambulatory Visit: Payer: Self-pay

## 2017-04-17 DIAGNOSIS — I2510 Atherosclerotic heart disease of native coronary artery without angina pectoris: Secondary | ICD-10-CM

## 2017-04-17 MED ORDER — ATORVASTATIN CALCIUM 40 MG PO TABS: 40.0000 mg | ORAL_TABLET | Freq: Every day | ORAL | 3 refills | Status: DC

## 2017-05-01 ENCOUNTER — Other Ambulatory Visit (INDEPENDENT_AMBULATORY_CARE_PROVIDER_SITE_OTHER): Payer: Medicare Other

## 2017-05-01 ENCOUNTER — Encounter: Payer: Self-pay | Admitting: Internal Medicine

## 2017-05-01 ENCOUNTER — Ambulatory Visit (INDEPENDENT_AMBULATORY_CARE_PROVIDER_SITE_OTHER): Payer: Medicare Other | Admitting: Internal Medicine

## 2017-05-01 VITALS — BP 108/64 | HR 70 | Ht 67.0 in | Wt 248.0 lb

## 2017-05-01 DIAGNOSIS — R609 Edema, unspecified: Secondary | ICD-10-CM | POA: Diagnosis not present

## 2017-05-01 DIAGNOSIS — J310 Chronic rhinitis: Secondary | ICD-10-CM | POA: Diagnosis not present

## 2017-05-01 DIAGNOSIS — I2510 Atherosclerotic heart disease of native coronary artery without angina pectoris: Secondary | ICD-10-CM

## 2017-05-01 DIAGNOSIS — J9611 Chronic respiratory failure with hypoxia: Secondary | ICD-10-CM | POA: Diagnosis not present

## 2017-05-01 DIAGNOSIS — F17210 Nicotine dependence, cigarettes, uncomplicated: Secondary | ICD-10-CM | POA: Diagnosis not present

## 2017-05-01 DIAGNOSIS — J449 Chronic obstructive pulmonary disease, unspecified: Secondary | ICD-10-CM

## 2017-05-01 LAB — BASIC METABOLIC PANEL
BUN: 20 mg/dL (ref 6–23)
CO2: 25 mEq/L (ref 19–32)
Calcium: 10 mg/dL (ref 8.4–10.5)
Chloride: 107 mEq/L (ref 96–112)
Creatinine, Ser: 0.73 mg/dL (ref 0.40–1.50)
GFR: 110.7 mL/min (ref >60.00)
Glucose, Bld: 78 mg/dL (ref 70–99)
Potassium: 4 mEq/L (ref 3.5–5.1)
Sodium: 141 mEq/L (ref 135–145)

## 2017-05-01 NOTE — Patient Instructions (Addendum)
Stop allegra and take zyrtec 10 mg once daily as needed - take at bedtime if you find it makes you sleepy  Please remember to go to the lab department downstairs in the basement  for your tests - we will call you with the results when they are available.  We will have you checked on 3lpm nasal prongs overnight   The key is to stop smoking completely before smoking completely stops you!   Please schedule a follow up visit in 3 months but call sooner if needed

## 2017-05-01 NOTE — Progress Notes (Signed)
Subjective:    Patient ID: Paul [name], male    DOB: 1941/06/19  MRN: [id number]    Brief patient profile:  52 yowm  active smoker with dx of copd with worse symptoms since 2013 referred to pulmonary clinic 12/24/2012 by Dr Darcus Austin for COPD evaluation with GOLD II criteria May 2014    History of Present Illness  12/24/2012 1st pulmonary eval  On advair/ spiriva longterm with freq use of proventil prior to dx  PE presenting as pleurtic L CP May 2013 with "moderate clot burden" to  [facility] assoc with PAF and started on Xarelto and baseline doe x climbing steps but could walk flat all day long before and after PE but then indolent onset doe progressively worse x 9 months assoc with cough congestion > green mucus better p on levaquin x one week but convinced his coughing and breathing problems are all due to xarelto because of what he read on the package. rec Only use your albuterol (proaire) as a rescue medication (plan B)  to be used if you can't catch your breath   The key is to stop smoking completely before smoking completely stops you     02/05/2013 f/u ov/Paul [name] cc need less albuterol maint on advair and spiriva with worse cough and sob x mailbox and back and collapse in chair.   Mucus turned green again x 2 weeks prior to OV   rec Stop advair and lisinopril Start symbicort 160 Take 2 puffs first thing in am and then another 2 puffs about 12 hours later.  Only use your albuterol(proaire)  as a rescue medication to be used if you can't catch your breath by resting or doing a relaxed purse lip breathing pattern. The less you use it, the better it will work when you need it.  Prednisone 10 mg take  4 each am x 2 days,   2 each am x 2 days,  1 each am x2days and stop  Levaquin 750 x 5 days For cough mucinex dm best as needed  03/26/2013 f/u ov/Paul [name] re copd Chief Complaint  Patient presents with  . Follow-up    Breathing is unchanged since the last visit. Ran out of symbicort so started  back on advair about 2 wks ago. He could not tell difference between the 2 meds, but spouse noted he did not cough as much on symbicort.  mucus is more day than night, usually not green rec Ok to stop spiriva Work on [employer], technical sales technique:      06/21/2014 f/u ov/Paul [name] re: GOLD II COPD / still smoking, concerned re side effects of meds  Chief Complaint  Patient presents with  . Follow-up    Pt states had six minute walk test which was abnormal. He states that he was referred here to discuss this per Dr Darcus Austin. Pt was last seen here in May 2014 and states that his breathing is unchanged since then.     lots of phlegm and congestion each am and occ wakes at 3 am but mostly daytime. 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 Follow-up    Pt states that his cough and SOB are unchanged since the last visit. No new co's today.    no proair  Day of ov/ pacing himself and no longer doing steps so less sob/ less saba need - rattling cough esp in am, thick, slt green, not on max dose mucinex but seems to helps whereas even levaquin did not >> no changes , declined O2 .   03/07/2015 Follow up COPD /still smoking  Patient returns for a six-month follow-up. He says overall he has been doing okay He is currently a pulmonary rehabilitation and feels that it is really helping him. His O2 saturations do drop with exercise and he uses oxygen at pulmonary rehabilitation [facility] , he wears O2 with exercise and does much better.  O2 sat at rest are 95% walking. Today in office with drop with O2 saturation 88% on room  air. Patient denies any chest pain, palpitations, orthopnea, PND, or increased leg swelling. He does request that his oxygen orders be sent to [facility]  and is looking at the  portable concentrator unit-[employer].  He continues to smoke. We discussed cessation. He says he has no interest in quitting smoking. We discussed the dangers of oxygen and smoking. Patient was noted to have oxygen desaturations. Last visit and declined oxygen but now says that he is rated begin oxygen. rec Continue on current regimen  Work on not smoking  Begin Oxygen 2l/m with activity  Order sent to [place] for evaluation of portable concentrator >  [employer] 2lpm pulse and 3lpm pulse when walk     08/01/2016  f/u ov/Paul [name] re: GOLD 2 still smoking/ symb 160 just one bid  Chief Complaint  Patient presents with  . Follow-up    76morov. PFT results. pt states breathing is baseline, pt c/o sob w/exertion, prod cough w/green mucus & wheezing at times.  using sev saba's daily but not noct  Not using 02 as rec  Not using sym as rec  MMRC3 no change  rec Plan A = Automatic = symbicort 160 Take 2 puffs first thing in am and then another 2 puffs about 12 hours later.  Plan B = Backup Only use your albuterol (proair) as a rescue medication    01/29/2017  f/u ov/Paul [name] re:  GOLD II COPD  On 3lpm with any distance and o/w just using 3lpm hs plus symb 1602bid  Chief Complaint  Patient presents with  . Follow-up    Pt c/o prod cough with green sputum x 4 days. He states his breathing is overall doing well.    much worse since 01/26/17 but prior to that avg twice daily rescue and much more saba need  with onset of coughing fits prod of green mucus just in the last 4 d prior to OV   rec Plan A = Automatic = symbicort Take 2 puffs first thing in am and then another 2 puffs about 12 hours later and add spiriva 2 pffs each am  Levaquin 500 mg daily x 7 days and repeat if cough not resolved  Plan B = Backup Only use your albuterol as a  rescue medication  Plan C = Crisis - only use your albuterol nebulizer if you first try Plan B first   Please schedule a follow up visit in 3 months but call sooner if needed   Late add:  ? Mild edema > add lasix 20 mg  daily ? Needs off actos? > Follow up per Primary Care planned       05/01/2017  f/u ov/Paul [name] re: copd II/ still smoking/ swelling better off actos / on lasix 20 mg daily /  Chief Complaint  Patient presents with  . Follow-up    Pt c/o increased cough and SOB- relates to hot/humid weather. He is finding it hard to produce sputum, but when he does it's green. He is using proair 5-6 x per day on average.   doe gradually worse x 1 week  = MMRC3 = can't walk 100 yards even at a slow pace at a flat grade s stopping due to sob  Can do HT but not [employer] s 02 - doesn't usually use 02 in and out of buildings    Sleeps ok p  Increased 4lpm x 6 m on his own, no am ha  Some increased watery rhinitis last few weeks s purulence/ not better on allegra  No obvious day to day or daytime variability or assoc excess/ purulent sputum or mucus plugs or hemoptysis or cp or chest tightness, subjective wheeze or overt sinus or hb symptoms. No unusual exp hx or h/o childhood pna/ asthma or knowledge of premature birth.  Sleeping ok without nocturnal  or early am exacerbation  of respiratory  c/o's or need for noct saba. Also denies any obvious fluctuation of symptoms with weather or environmental changes or other aggravating or alleviating factors except as outlined above   Current Medications, Allergies, Complete Past Medical History, Past Surgical History, Family History, and Social History were reviewed in [employer] record.  ROS  The following are not active complaints unless bolded sore throat, dysphagia, dental problems, itching, sneezing,  nasal congestion or excess/ purulent secretions, ear ache,   fever, chills, sweats, unintended wt loss, classically pleuritic or  exertional cp,  orthopnea pnd or leg swelling, presyncope, palpitations, abdominal pain, anorexia, nausea, vomiting, diarrhea  or change in bowel or bladder habits, change in stools or urine, dysuria,hematuria,  rash, arthralgias, visual complaints, headache, numbness, weakness or ataxia or problems with walking or coordination,  change in mood/affect or memory.                   Objective:   Physical Exam  02/05/2013  Wt 266 > 03/26/2013  269 > 06/21/2014 261 > 08/04/2014  256 >257 03/07/2015 > 09/05/2015  254 > 01/26/2016  250 > 08/01/2016  248 > 01/29/2017   250   > 05/01/2017   248    amb obese  Wm vital signs reviewed  -  - Note on arrival 02 sats  93% on RA     HEENT: nl dentition,   and oropharynx. Nl external ear canals without cough reflex - moderate bilateral non-specific turbinate edema     NECK :  without JVD/Nodes/TM/ nl carotid upstrokes bilaterally   LUNGS: no acc muscle use,  Nl contour chest  With diffuse distant  insp and exp rhonchi bilaterally    CV:  RRR  no s3 or murmur or increase in P2, and no  Significant  edema   ABD:  soft and nontender with nl inspiratory excursion in the supine position. No bruits or organomegaly appreciated, bowel sounds nl  MS:  Nl gait/ ext warm without deformities, calf tenderness, cyanosis or clubbing No obvious joint restrictions   SKIN: warm and dry without lesions    NEURO:  alert, approp, nl sensorium with  no motor or  cerebellar deficits apparent.    Labs ordered/ reviewed:      Chemistry      Component Value Date/Time   NA 141 05/01/2017 1358   NA 140 01/13/2014 0922   K 4.0 05/01/2017 1358   K 4.6 01/13/2014 0922   CL 107 05/01/2017 1358   CL 105 01/13/2013 0856   CO2 25 05/01/2017 1358   CO2 26 01/13/2014 0922   BUN 20 05/01/2017 1358   BUN 20.1 01/13/2014 0922   CREATININE 0.73 05/01/2017 1358   CREATININE 0.9 01/13/2014 0922      Component Value Date/Time   CALCIUM 10.0 05/01/2017 1358   CALCIUM 9.9  01/13/2014 0922   ALKPHOS 47 03/04/2017 0829   ALKPHOS 53 01/13/2014 0922   AST 15 03/04/2017 0829   AST 21 01/13/2014 0922   ALT 17 03/04/2017 0829   ALT 25 01/13/2014 0922   BILITOT 0.5 03/04/2017 0829   BILITOT 0.39 01/13/2014 0922                       Assessment & Plan:

## 2017-05-01 NOTE — Assessment & Plan Note (Signed)
04/05/15 SATURATION QUALIFICATIONS: (This note is used to comply with regulatory documentation for home oxygen) Patient Saturations on Room Air at Rest = 95% Patient Saturations on Room Air while Ambulating = 88% Patient Saturations on 2 Liters of oxygen while Ambulating = 93% Please briefly explain why patient needs home oxygen: desats with ambulation - 01/26/2016  Patient Saturations on Room Air at Rest =83%--increased to 93% 3lpm pulsed o2  As of 05/01/2017 rec  4lpm hs/   titrate to > 90% walking and rec recheck 3lpm ono

## 2017-05-02 DIAGNOSIS — J310 Chronic rhinitis: Secondary | ICD-10-CM | POA: Insufficient documentation

## 2017-05-02 NOTE — Assessment & Plan Note (Signed)
Body mass index is 38.84 kg/m.  -  No change  Lab Results  Component Value Date   TSH 1.91 05/26/2012     Contributing to gerd risk/ doe/reviewed the need and the process to achieve and maintain neg calorie balance > defer f/u primary care including intermittently monitoring thyroid status

## 2017-05-02 NOTE — Assessment & Plan Note (Signed)
rec trial of zyrtec instead of allegra 05/01/2017

## 2017-05-02 NOTE — Assessment & Plan Note (Addendum)
-  PFT's 03/12/13 FEV1  2.27 (79%) ratio 52 and no better p B2 DLCO 45 and 60% p correction - Ex desats documented 06/21/14 > refused 02 > agreed to accept 03/26/15 see chronic resp failure  - 01/26/2016  ry symbicort instead of advair - 04/30/2016  After extensive coaching HFA effectiveness =    90%  - PFT's  08/01/2016  FEV1 2.03  (76 % ) ratio 57  p 8 % improvement from saba p symbicort 160 x one puff  prior to study with DLCO  37/39 % corrects to 42  % for alv volume  > increase symb 160 2bid  - added spiriva [facility]  01/29/2017   Despite smoking his copd is relatively stable  Group D in terms of symptom/risk and laba/lama/ICS  therefore appropriate rx at this point > no change rx    Each maintenance medication was reviewed in detail including most importantly the difference between maintenance and as needed and under what circumstances the prns are to be used.  Please see AVS for specific  Instructions which are unique to this visit and I personally typed out  which were reviewed in detail in writing with the patient and a copy provided.

## 2017-05-02 NOTE — Assessment & Plan Note (Signed)
>  3 min   Not willing to commit to quit at this point despite fully informed risks of smokin vs benefits of stopping

## 2017-05-02 NOTE — Progress Notes (Signed)
Spoke with pt and notified of results per Dr. Wert. Pt verbalized understanding and denied any questions. 

## 2017-05-02 NOTE — Assessment & Plan Note (Signed)
Resolved off actos and on low dose lasix with ok bmet > no change rx

## 2017-05-04 ENCOUNTER — Other Ambulatory Visit: Payer: Self-pay | Admitting: Internal Medicine

## 2017-05-10 ENCOUNTER — Telehealth: Payer: Self-pay | Admitting: Internal Medicine

## 2017-05-10 MED ORDER — FUROSEMIDE 20 MG PO TABS: 20.0000 mg | ORAL_TABLET | Freq: Every day | ORAL | 3 refills | Status: DC

## 2017-05-10 NOTE — Telephone Encounter (Signed)
Spoke with pt's wife Santiago Glad (dpr on file), requesting 90 day supply of Lasix to [facility].  This has been sent.  Nothing further needed.

## 2017-05-16 DIAGNOSIS — J9610 Chronic respiratory failure, unspecified whether with hypoxia or hypercapnia: Secondary | ICD-10-CM | POA: Diagnosis not present

## 2017-05-17 ENCOUNTER — Telehealth: Payer: Self-pay | Admitting: Internal Medicine

## 2017-05-17 DIAGNOSIS — J449 Chronic obstructive pulmonary disease, unspecified: Secondary | ICD-10-CM

## 2017-05-17 NOTE — Telephone Encounter (Signed)
Per MW- ONO on 3lpm done by [facility] on 05/11/17 was still abnormal  Needs to increase noct o2 to 4lpm with sleep and have ONO repeated on the 4lpm  LMTCB and will then order ONO on 4

## 2017-05-20 ENCOUNTER — Encounter: Payer: Self-pay | Admitting: Internal Medicine

## 2017-05-20 DIAGNOSIS — J9611 Chronic respiratory failure with hypoxia: Secondary | ICD-10-CM

## 2017-05-20 NOTE — Telephone Encounter (Signed)
It's not an average but the total duration of time under targeted saturations, so yes it can be affected by the prongs coming out  [facility] to repeat on same setting and wear it tighter this time

## 2017-05-20 NOTE — Telephone Encounter (Signed)
Pt is concerned that ONO on 3L may not had been valid due to cannula falling out. Pt has suggested repeating ONO on 3L.  MW please advise. Thanks,

## 2017-06-01 ENCOUNTER — Encounter: Payer: Self-pay | Admitting: Internal Medicine

## 2017-06-01 ENCOUNTER — Other Ambulatory Visit: Payer: Self-pay | Admitting: Internal Medicine

## 2017-06-01 DIAGNOSIS — J449 Chronic obstructive pulmonary disease, unspecified: Secondary | ICD-10-CM

## 2017-06-03 ENCOUNTER — Other Ambulatory Visit: Payer: Self-pay

## 2017-06-03 ENCOUNTER — Telehealth: Payer: Self-pay | Admitting: Internal Medicine

## 2017-06-03 DIAGNOSIS — J449 Chronic obstructive pulmonary disease, unspecified: Secondary | ICD-10-CM

## 2017-06-03 DIAGNOSIS — J9611 Chronic respiratory failure with hypoxia: Secondary | ICD-10-CM

## 2017-06-03 MED ORDER — TIOTROPIUM BROMIDE MONOHYDRATE 2.5 MCG/ACT IN AERS: INHALATION_SPRAY | RESPIRATORY_TRACT | 3 refills | Status: DC

## 2017-06-03 NOTE — Telephone Encounter (Signed)
Unless we specifically rec to stop the med at the office we expect him to continue it between visits and keep up with the refills the usual way s having to email [place] everytime

## 2017-06-03 NOTE — Telephone Encounter (Signed)
Per MW- ONO on 3LPM ([facility] 05/23/17) still abnormal, needs 4lpm with sleep and repeat ONO on 4lpm   Spoke with pt and notified of results per Dr. Melvyn Novas. Pt verbalized understanding and denied any questions. Order sent to [facility]

## 2017-06-03 NOTE — Telephone Encounter (Signed)
MW  Please Advise-please see pt email   Your last recommendations from last ov  . Tanda Rockers, MD (Physician) at 05/01/2017 1:44 PM - Signed    Stop allegra and take zyrtec 10 mg once daily as needed - take at bedtime if you find it makes you sleepy  Please remember to go to the lab department downstairs in the basement  for your tests - we will call you with the results when they are available.  We will have you checked on 3lpm nasal prongs overnight   The key is to stop smoking completely before smoking completely stops you!   Please schedule a follow up visit in 3 months but call sooner if needed

## 2017-06-06 ENCOUNTER — Encounter: Payer: Self-pay | Admitting: Internal Medicine

## 2017-06-06 DIAGNOSIS — J9611 Chronic respiratory failure with hypoxia: Secondary | ICD-10-CM | POA: Diagnosis not present

## 2017-06-10 ENCOUNTER — Telehealth: Payer: Self-pay | Admitting: Internal Medicine

## 2017-06-10 NOTE — Telephone Encounter (Signed)
MW reviewed ONO on 4lpm done by [facility] on 06/06/17- per MW- this is normal, needs to continue 4lpm o2 with sleep  Spoke with pt and notified of results per Dr. Melvyn Novas. Pt verbalized understanding and denied any questions.

## 2017-06-26 DIAGNOSIS — I4891 Unspecified atrial fibrillation: Secondary | ICD-10-CM | POA: Diagnosis not present

## 2017-06-26 DIAGNOSIS — Z85118 Personal history of other malignant neoplasm of bronchus and lung: Secondary | ICD-10-CM | POA: Diagnosis not present

## 2017-06-26 DIAGNOSIS — M810 Age-related osteoporosis without current pathological fracture: Secondary | ICD-10-CM | POA: Diagnosis not present

## 2017-06-26 DIAGNOSIS — I10 Essential (primary) hypertension: Secondary | ICD-10-CM | POA: Diagnosis not present

## 2017-06-26 DIAGNOSIS — Z0000 Encounter for general adult medical examination without abnormal findings: Secondary | ICD-10-CM | POA: Diagnosis not present

## 2017-06-26 DIAGNOSIS — E7800 Pure hypercholesterolemia, unspecified: Secondary | ICD-10-CM | POA: Diagnosis not present

## 2017-06-26 DIAGNOSIS — J449 Chronic obstructive pulmonary disease, unspecified: Secondary | ICD-10-CM | POA: Diagnosis not present

## 2017-06-26 DIAGNOSIS — E1165 Type 2 diabetes mellitus with hyperglycemia: Secondary | ICD-10-CM | POA: Diagnosis not present

## 2017-06-26 DIAGNOSIS — E559 Vitamin D deficiency, unspecified: Secondary | ICD-10-CM | POA: Diagnosis not present

## 2017-06-26 DIAGNOSIS — F17210 Nicotine dependence, cigarettes, uncomplicated: Secondary | ICD-10-CM | POA: Diagnosis not present

## 2017-07-03 ENCOUNTER — Encounter: Payer: Self-pay | Admitting: Podiatry

## 2017-07-03 ENCOUNTER — Ambulatory Visit (INDEPENDENT_AMBULATORY_CARE_PROVIDER_SITE_OTHER): Payer: Medicare Other | Admitting: Podiatry

## 2017-07-03 DIAGNOSIS — E1140 Type 2 diabetes mellitus with diabetic neuropathy, unspecified: Secondary | ICD-10-CM | POA: Diagnosis not present

## 2017-07-03 DIAGNOSIS — M79609 Pain in unspecified limb: Secondary | ICD-10-CM | POA: Diagnosis not present

## 2017-07-03 DIAGNOSIS — B351 Tinea unguium: Secondary | ICD-10-CM | POA: Diagnosis not present

## 2017-07-03 NOTE — Patient Instructions (Signed)

## 2017-07-03 NOTE — Progress Notes (Signed)
Patient ID: Paul [name], male   DOB: 1941-02-25, 76 y.o.   MRN: [id number]    Subjective: This patient presents again complaining of toenails are uncomfortable walking wearing shoes and requests toenail debridement. Patient has a history of fracture of left leg resulting in surgical reduction and physical therapy in patient. At this time patient is able to walk slowly with a walker and is improving over time Patient is diabetic with a history of neuropathy History of tibial fracture  Objective:  Orientated 3  Vascular: Mild pitting edema left No calf pain or calf edema bilaterally DP pulses 2/4 bilaterally PT pulses 2/4 bilaterally Capillary reflex immediate bilaterally  Neurological: Sensation to 10 g monofilament wire intact 1/5 right 2/5 left Vibratory sensation nonreactive bilaterally Ankle reflex equal and reactive bilaterally  Dermatological: No open skin lesions bilaterally Plantar callus base first metatarsocuneiform without any bleeding within callus there is no erythema or warmth in this callused area The toenails are discolored, brittle, deformed, elongated and tender to direct palpation   Musculoskeletal: Upon weight-bearing patient has extreme pes planus with the forefoot significantly abducted on the rear foot bilaterally Patient has slow unstable gait with maximum hyperpronation throughout the gait cycleusing a walker There is palpable tenderness in or around the base of first metatarsal cuneiform which seems to duplicate patient's area of discomfort Patient has diabetic shoes with custom insoles that contour well and in a good state of repair Manual motor testing plantar flexion 5/5 bilaterally Dorsi flexion 4/5 right and 3/5 left  Assessment: Satisfactory vascular status Diabetic Sensory and motor neuropathy Hyperpronation Gait disturbance Mild dropfoot left  Plan: Debridement of toenails 6-10 mechanically without any bleeding Patient  ambulates with a roller walker Patient's neurologist recommended dropfoot brace. Patient discussed dropfoot brace with [facility], however, decided against a dropfoot brace for the left  Reappoint 3 months

## 2017-07-08 ENCOUNTER — Other Ambulatory Visit (HOSPITAL_COMMUNITY): Payer: Self-pay | Admitting: *Deleted

## 2017-07-09 ENCOUNTER — Ambulatory Visit (HOSPITAL_COMMUNITY)
Admission: RE | Admit: 2017-07-09 | Discharge: 2017-07-09 | Disposition: A | Discharge status: Home / Self Care | Payer: Medicare Other | Source: Ambulatory Visit | Attending: Family Medicine | Admitting: Family Medicine

## 2017-07-09 DIAGNOSIS — M810 Age-related osteoporosis without current pathological fracture: Secondary | ICD-10-CM | POA: Insufficient documentation

## 2017-07-09 MED ORDER — ZOLEDRONIC ACID 5 MG/100ML IV SOLN
5.0000 mg | Freq: Once | INTRAVENOUS | Status: AC
  Administered 2017-07-09: 5 mg via INTRAVENOUS

## 2017-07-09 MED ORDER — ZOLEDRONIC ACID 5 MG/100ML IV SOLN
INTRAVENOUS | Status: AC
  Filled 2017-07-09: qty 100

## 2017-07-30 DIAGNOSIS — Z7984 Long term (current) use of oral hypoglycemic drugs: Secondary | ICD-10-CM | POA: Diagnosis not present

## 2017-07-30 DIAGNOSIS — E7800 Pure hypercholesterolemia, unspecified: Secondary | ICD-10-CM | POA: Diagnosis not present

## 2017-07-30 DIAGNOSIS — F17210 Nicotine dependence, cigarettes, uncomplicated: Secondary | ICD-10-CM | POA: Diagnosis not present

## 2017-07-30 DIAGNOSIS — I10 Essential (primary) hypertension: Secondary | ICD-10-CM | POA: Diagnosis not present

## 2017-07-30 DIAGNOSIS — E1140 Type 2 diabetes mellitus with diabetic neuropathy, unspecified: Secondary | ICD-10-CM | POA: Diagnosis not present

## 2017-07-30 DIAGNOSIS — M898X2 Other specified disorders of bone, upper arm: Secondary | ICD-10-CM | POA: Diagnosis not present

## 2017-07-30 DIAGNOSIS — Z885 Allergy status to narcotic agent status: Secondary | ICD-10-CM | POA: Diagnosis not present

## 2017-07-30 DIAGNOSIS — Z888 Allergy status to other drugs, medicaments and biological substances status: Secondary | ICD-10-CM | POA: Diagnosis not present

## 2017-07-30 DIAGNOSIS — D492 Neoplasm of unspecified behavior of bone, soft tissue, and skin: Secondary | ICD-10-CM | POA: Diagnosis not present

## 2017-07-30 DIAGNOSIS — Z79899 Other long term (current) drug therapy: Secondary | ICD-10-CM | POA: Diagnosis not present

## 2017-07-30 DIAGNOSIS — J439 Emphysema, unspecified: Secondary | ICD-10-CM | POA: Diagnosis not present

## 2017-08-05 ENCOUNTER — Encounter: Payer: Self-pay | Admitting: Internal Medicine

## 2017-08-05 ENCOUNTER — Ambulatory Visit (INDEPENDENT_AMBULATORY_CARE_PROVIDER_SITE_OTHER): Payer: Medicare Other | Admitting: Internal Medicine

## 2017-08-05 VITALS — BP 112/64 | HR 75 | Ht 66.5 in | Wt 249.0 lb

## 2017-08-05 DIAGNOSIS — F17210 Nicotine dependence, cigarettes, uncomplicated: Secondary | ICD-10-CM

## 2017-08-05 DIAGNOSIS — J9611 Chronic respiratory failure with hypoxia: Secondary | ICD-10-CM | POA: Diagnosis not present

## 2017-08-05 DIAGNOSIS — J449 Chronic obstructive pulmonary disease, unspecified: Secondary | ICD-10-CM

## 2017-08-05 DIAGNOSIS — I2510 Atherosclerotic heart disease of native coronary artery without angina pectoris: Secondary | ICD-10-CM | POA: Diagnosis not present

## 2017-08-05 NOTE — Assessment & Plan Note (Signed)

## 2017-08-05 NOTE — Assessment & Plan Note (Signed)
-  PFT's 03/12/13 FEV1  2.27 (79%) ratio 52 and no better p B2 DLCO 45 and 60% p correction - Ex desats documented 06/21/14 > refused 02 > agreed to accept 03/26/15 see chronic resp failure  - 01/26/2016  rx symbicort instead of advair - PFT's  08/01/2016  FEV1 2.03  (76 % ) ratio 57  p 8 % improvement from saba p symbicort 160 x one puff  prior to study with DLCO  37/39 % corrects to 42  % for alv volume  > increase symb 160 2bid  - added spiriva [facility]  01/29/2017  - 08/05/2017  After extensive coaching HFA effectiveness =    90%   Despite continued smoking and formaldehyde exposure and rhonchi on exam (no change from prev) he remains relatively well compensated on present rx = Pt is Group B in terms of symptom/risk and laba/lama therefore appropriate rx at this point.      I had an extended discussion with the patient reviewing all relevant studies completed to date and  lasting 15 to 20 minutes of a 25 minute visit    Each maintenance medication was reviewed in detail including most importantly the difference between maintenance and prns and under what circumstances the prns are to be triggered using an action plan format that is not reflected in the computer generated alphabetically organized AVS.    Please see AVS for specific instructions unique to this visit that I personally wrote and verbalized to the the pt in detail and then reviewed with pt  by my nurse highlighting any  changes in therapy recommended at today's visit to their plan of care.

## 2017-08-05 NOTE — Assessment & Plan Note (Signed)
04/05/15 SATURATION QUALIFICATIONS: (This note is used to comply with regulatory documentation for home oxygen) Patient Saturations on Room Air at Rest = 95% Patient Saturations on Room Air while Ambulating = 88% Patient Saturations on 2 Liters of oxygen while Ambulating = 93% Please briefly explain why patient needs home oxygen: desats with ambulation - 01/26/2016  Patient Saturations on Room Air at Rest =83%--increased to 93% 3lpm pulsed o2 - 05/10/17  Repeat ono on 3lpm >  33 min desat at < 89% so 05/17/2017  rec  4lpm and repeat ono on 4lpm  - ONO on 3lpm 05/23/17  desat 14 m @ <89% rec 4lpm as prev instructed .> repeated on 4lpm 06/05/17 and still 27 m at < 89% so rec continue 4lpm / consider sleep study next ov  As of 08/05/2017 rec  4lpm hs/   titrate to > 90% walking

## 2017-08-05 NOTE — Assessment & Plan Note (Signed)
Complicated by dm, hbp/ hyperlipidemia  Body mass index is 39.59 kg/m.  -  trending = no change  Lab Results  Component Value Date   TSH 1.91 05/26/2012     Contributing to gerd risk/ doe/reviewed the need and the process to achieve and maintain neg calorie balance > defer f/u primary care including intermittently monitoring thyroid status

## 2017-08-05 NOTE — Patient Instructions (Addendum)
The key is to stop smoking completely before smoking completely stops you!   Breathe clear air - best to crack the windows   Please schedule a follow up visit in 4  months but call sooner if needed

## 2017-08-05 NOTE — Progress Notes (Signed)
Subjective:    Patient ID: Paul [name], male    DOB: 05-16-1941  MRN: [id number]    Brief patient profile:  53 yowm  active smoker with dx of copd with worse symptoms since 2013 referred to pulmonary clinic 12/24/2012 by Dr Darcus Austin for COPD evaluation with GOLD II criteria May 2014    History of Present Illness  12/24/2012 1st pulmonary eval  On advair/ spiriva longterm with freq use of proventil prior to dx  PE presenting as pleurtic L CP May 2013 with "moderate clot burden" to  [facility] assoc with PAF and started on Xarelto and baseline doe x climbing steps but could walk flat all day long before and after PE but then indolent onset doe progressively worse x 9 months assoc with cough congestion > green mucus better p on levaquin x one week but convinced his coughing and breathing problems are all due to xarelto because of what he read on the package. rec Only use your albuterol (proaire) as a rescue medication (plan B)  to be used if you can't catch your breath   The key is to stop smoking completely before smoking completely stops you     02/05/2013 f/u ov/Roben Schliep cc need less albuterol maint on advair and spiriva with worse cough and sob x mailbox and back and collapse in chair.   Mucus turned green again x 2 weeks prior to OV   rec Stop advair and lisinopril Start symbicort 160 Take 2 puffs first thing in am and then another 2 puffs about 12 hours later.  Only use your albuterol(proaire)  as a rescue medication to be used if you can't catch your breath by resting or doing a relaxed purse lip breathing pattern. The less you use it, the better it will work when you need it.  Prednisone 10 mg take  4 each am x 2 days,   2 each am x 2 days,  1 each am x2days and stop  Levaquin 750 x 5 days For cough mucinex dm best as needed  03/26/2013 f/u ov/[name] re copd Chief Complaint  Patient presents with  . Follow-up    Breathing is unchanged since the last visit. Ran out of symbicort so started  back on advair about 2 wks ago. He could not tell difference between the 2 meds, but spouse noted he did not cough as much on symbicort.  mucus is more day than night, usually not green rec Ok to stop spiriva Work on Engineer, technical sales technique:      06/21/2014 f/u ov/Khamari Sheehan re: GOLD II COPD / still smoking, concerned re side effects of meds  Chief Complaint  Patient presents with  . Follow-up    Pt states had six minute walk test which was abnormal. He states that he was referred here to discuss this per Dr Darcus Austin. Pt was last seen here in May 2014 and states that his breathing is unchanged since then.     lots of phlegm and congestion each am and occ wakes at 3 am but mostly daytime. 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 Follow-up    Pt states that his cough and SOB are unchanged since the last visit. No new co's today.    no proair  Day of ov/ pacing himself and no longer doing steps so less sob/ less saba need - rattling cough esp in am, thick, slt green, not on max dose mucinex but seems to helps whereas even levaquin did not >> no changes , declined O2 .   03/07/2015 Follow up COPD /still smoking  Patient returns for a six-month follow-up. He says overall he has been doing okay He is currently a pulmonary rehabilitation and feels that it is really helping him. His O2 saturations do drop with exercise and he uses oxygen at pulmonary rehabilitation [facility] , he wears O2 with exercise and does much better.  O2 sat at rest are 95% walking. Today in office with drop with O2 saturation 88% on room  air. Patient denies any chest pain, palpitations, orthopnea, PND, or increased leg swelling. He does request that his oxygen orders be sent to [facility]  and is looking at the  portable concentrator unit-[employer].  He continues to smoke. We discussed cessation. He says he has no interest in quitting smoking. We discussed the dangers of oxygen and smoking. Patient was noted to have oxygen desaturations. Last visit and declined oxygen but now says that he is rated begin oxygen. rec Continue on current regimen  Work on not smoking  Begin Oxygen 2l/m with activity  Order sent to [place] for evaluation of portable concentrator >  [employer] 2lpm pulse and 3lpm pulse when walk     08/01/2016  f/u ov/[name] re: GOLD 2 still smoking/ symb 160 just one bid  Chief Complaint  Patient presents with  . Follow-up    76morov. PFT results. pt states breathing is baseline, pt c/o sob w/exertion, prod cough w/green mucus & wheezing at times.  using sev saba's daily but not noct  Not using 02 as rec  Not using sym as rec  MMRC3 no change  rec Plan A = Automatic = symbicort 160 Take 2 puffs first thing in am and then another 2 puffs about 12 hours later.  Plan B = Backup Only use your albuterol (proair) as a rescue medication    01/29/2017  f/u ov/[name] re:  GOLD II COPD  On 3lpm with any distance and o/w just using 3lpm hs plus symb 1602bid  Chief Complaint  Patient presents with  . Follow-up    Pt c/o prod cough with green sputum x 4 days. He states his breathing is overall doing well.    much worse since 01/26/17 but prior to that avg twice daily rescue and much more saba need  with onset of coughing fits prod of green mucus just in the last 4 d prior to OV   rec Plan A = Automatic = symbicort Take 2 puffs first thing in am and then another 2 puffs about 12 hours later and add spiriva 2 pffs each am  Levaquin 500 mg daily x 7 days and repeat if cough not resolved  Plan B = Backup Only use your albuterol as a  rescue medication  Plan C = Crisis - only use your albuterol nebulizer if you first try Plan B first   Please schedule a follow up visit in 3 months but call sooner if needed   Late add:  ? Mild edema > add lasix 20 mg  daily ? Needs off actos? > Follow up per Primary Care planned       05/01/2017  f/u ov/Rockne Dearinger re: copd II/ still smoking/ swelling better off actos / on lasix 20 mg daily /  Chief Complaint  Patient presents with  . Follow-up    Pt c/o increased cough and SOB- relates to hot/humid weather. He is finding it hard to produce sputum, but when he does it's green. He is using proair 5-6 x per day on average.   doe gradually worse x 1 week  = MMRC3 = can't walk 100 yards even at a slow pace at a flat grade s stopping due to sob  Can do HT but not [employer] s 02 - doesn't usually use 02 in and out of buildings    Sleeps ok p  Increased 4lpm x 6 m on his own, no am ha  Some increased watery rhinitis last few weeks s purulence/ not better on allegra rec Stop allegra and take zyrtec 10 mg once daily as needed - take at bedtime if you find it makes you sleepy      08/05/2017  f/u ov/Geordie Nooney re:   COPD II/ still smoking < 10 per day maint symb/spiriva  Chief Complaint  Patient presents with  . Follow-up    Breathing is overall doing well. He states he is coughing more since he has been home from the nursing home. His cough is prod with brown sputum. He is using his proair 4 x daily on average.   symbicort 160/spiriva 2.5  Worse breathing when not at home = stuffy nose cough not an issue while living at rehab  Convinced it's the formaldehyde in the house and only smokes outside  preferes allegra over zyrtec  MMRC3 = can't walk 100 yards even at a slow pace at a flat grade s stopping due to sob  / 02 4lpm hs  Really no change doe or cough x last year unless leaves house, then improved  02 4lpm hs/ no am ha/ feels sleeps well   No obvious day to day or daytime variability or assoc  mucus  plugs or hemoptysis or cp or chest tightness, subjective wheeze or overt sinus or hb symptoms. No unusual exp hx or h/o childhood pna/ asthma or knowledge of premature birth.  Sleeping ok flat on 4lpm without nocturnal  or early am exacerbation  of respiratory  c/o's or need for noct saba. Also denies any obvious fluctuation of symptoms with weather or environmental changes or other aggravating or alleviating factors except as outlined above   Current Allergies, Complete Past Medical History, Past Surgical History, Family History, and Social History were reviewed in [employer] record.  ROS  The following are not active complaints unless bolded sore throat, dysphagia, dental problems, itching, sneezing,  nasal congestion or disharge of excess mucus or purulent secretions, ear ache,   fever, chills, sweats, unintended wt loss or wt gain, classically pleuritic or exertional cp,  orthopnea pnd or leg swelling, presyncope, palpitations, abdominal pain, anorexia, nausea, vomiting, diarrhea  or change in bowel habits or bladder habits, change in stools or change in urine, dysuria, hematuria,  rash, arthralgias, visual complaints, headache, numbness, weakness or ataxia or problems with walking or coordination,  change in mood/affect or memory.        Current Meds  Medication Sig  . acetaminophen (TYLENOL) 500 MG tablet Take 1,000 mg by mouth daily.   Marland Kitchen albuterol (PROAIR HFA) 108 (90  BASE) MCG/ACT inhaler Inhale 2 puffs into the lungs every 2 (two) hours as needed for wheezing or shortness of breath.   Marland Kitchen aspirin 81 MG tablet Take 81 mg by mouth daily.  Marland Kitchen atorvastatin (LIPITOR) 40 MG tablet Take 1 tablet (40 mg total) by mouth daily. (Patient taking differently: Take 40 mg by mouth at bedtime. )  . budesonide-formoterol (SYMBICORT) 160-4.5 MCG/ACT inhaler Take 2 puffs first thing in am and then another 2 puffs about 12 hours later.  . calcium carbonate (OS-CAL) 600 MG TABS tablet Take  600 mg by mouth daily with supper.   . fexofenadine (ALLEGRA) 180 MG tablet Take 180 mg by mouth daily with breakfast.   . fluticasone (FLONASE) 50 MCG/ACT nasal spray Place 1 spray into the nose 2 (two) times daily.   . furosemide (LASIX) 20 MG tablet Take 1 tablet (20 mg total) by mouth daily.  Marland Kitchen gabapentin (NEURONTIN) 300 MG capsule 4 at bedtime  . glimepiride (AMARYL) 2 MG tablet Take 2 mg by mouth 2 (two) times daily.   Marland Kitchen guaiFENesin (MUCINEX) 600 MG 12 hr tablet Take 1,200 mg by mouth daily with breakfast.   . losartan (COZAAR) 50 MG tablet Take 1 tablet (50 mg total) by mouth daily. (Patient taking differently: Take 50 mg by mouth daily with breakfast. )  . metFORMIN (GLUCOPHAGE) 1000 MG tablet Take 1,000 mg by mouth 2 (two) times daily with a meal.    . metoCLOPramide (REGLAN) 10 MG tablet Take 10 mg by mouth 2 (two) times daily.  . Multiple Vitamins-Minerals (CENTRUM SILVER ADULT 50+) TABS Take 1 tablet by mouth daily with breakfast.  . OXYGEN 4L of oxygen at night and 3L of oxygen when walking long distances  . Tiotropium Bromide Monohydrate (SPIRIVA RESPIMAT) 2.5 MCG/ACT AERS USE 2 PUFFS EVERY MORNING  . Vitamin D, Ergocalciferol, (DRISDOL) 50000 UNITS CAPS capsule Take 50,000 Units by mouth every 14 (fourteen) days.                      Objective:   Physical Exam  02/05/2013  Wt 266 > 03/26/2013  269 > 06/21/2014 261 > 08/04/2014  256 >257 03/07/2015 > 09/05/2015  254 > 01/26/2016  250 > 08/01/2016  248 > 01/29/2017   250   > 05/01/2017   248  > 08/05/2017   249 with shoes on  amb obese amb  Wm vital signs reviewed  -  - Note on arrival 02 sats  96% on RA     HEENT: nl dentition,   and oropharynx. Nl external ear canals without cough reflex - moderate bilateral non-specific turbinate edema     NECK :  without JVD/Nodes/TM/ nl carotid upstrokes bilaterally   LUNGS: no acc muscle use,  Nl contour chest  With diffuse distant early   exp rhonchi bilaterally better with plm     CV:  RRR  no s3 or murmur or increase in P2, and no  Trace  edema L ankle/ none on R   ABD:  soft and nontender with nl inspiratory excursion in the supine position. No bruits or organomegaly appreciated, bowel sounds nl  MS:  Walks with rolling walker/ ext warm without deformities, calf tenderness, cyanosis or clubbing No obvious joint restrictions   SKIN: warm and dry without lesions    NEURO:  alert, approp, nl sensorium with  no motor or cerebellar deficits apparent.  Assessment & Plan:

## 2017-08-06 DIAGNOSIS — N200 Calculus of kidney: Secondary | ICD-10-CM | POA: Diagnosis not present

## 2017-08-06 DIAGNOSIS — M549 Dorsalgia, unspecified: Secondary | ICD-10-CM | POA: Diagnosis not present

## 2017-08-13 DIAGNOSIS — B029 Zoster without complications: Secondary | ICD-10-CM | POA: Diagnosis not present

## 2017-08-13 DIAGNOSIS — Z23 Encounter for immunization: Secondary | ICD-10-CM | POA: Diagnosis not present

## 2017-08-19 DIAGNOSIS — B0229 Other postherpetic nervous system involvement: Secondary | ICD-10-CM | POA: Diagnosis not present

## 2017-10-01 DIAGNOSIS — E119 Type 2 diabetes mellitus without complications: Secondary | ICD-10-CM | POA: Diagnosis not present

## 2017-10-07 ENCOUNTER — Ambulatory Visit (INDEPENDENT_AMBULATORY_CARE_PROVIDER_SITE_OTHER): Payer: Medicare Other | Admitting: Podiatry

## 2017-10-07 ENCOUNTER — Encounter: Payer: Self-pay | Admitting: Podiatry

## 2017-10-07 DIAGNOSIS — B351 Tinea unguium: Secondary | ICD-10-CM | POA: Diagnosis not present

## 2017-10-07 DIAGNOSIS — E1140 Type 2 diabetes mellitus with diabetic neuropathy, unspecified: Secondary | ICD-10-CM

## 2017-10-07 DIAGNOSIS — M79609 Pain in unspecified limb: Secondary | ICD-10-CM

## 2017-10-07 NOTE — Progress Notes (Signed)
Patient ID: Paul [name], adult   DOB: 11/23/1940, 76 y.o.   MRN: [id number]   Subjective: This patient presents again complaining of toenails are uncomfortable walking wearing shoes and requests toenail debridement. Patient has a history of fracture of left leg resulting in surgical reduction and physical therapy in patient. At this time patient is able to walk slowly with a walker and is improving over time Patient is diabetic with a history of neuropathy History of tibial fracture  Objective:  Orientated 3  Vascular: Mild pitting edema left No calf pain or calf edema bilaterally DP pulses 2/4 bilaterally PT pulses 2/4 bilaterally Capillary reflex immediate bilaterally  Neurological: Sensation to 10 g monofilament wire intact 1/5 right 2/5 left Vibratory sensation nonreactive bilaterally Ankle reflex equal and reactive bilaterally  Dermatological: No open skin lesions bilaterally Plantar callus base first metatarsocuneiform without any bleeding within callus there is no erythema or warmth in this callused area The toenails are discolored, brittle, deformed, elongated and tender to direct palpation   Musculoskeletal: Upon weight-bearing patient has extreme pes planus with the forefoot significantly abducted on the rear foot bilaterally Patient has slow unstable gait with maximum hyperpronation throughout the gait cycleusing a walker There is palpable tenderness in or around the base of first metatarsal cuneiform which seems to duplicate patient's area of discomfort Patient has diabetic shoes with custom insoles that contour well and in a good state of repair Manual motor testing plantar flexion 5/5 bilaterally Dorsi flexion 4/5 right and 3/5 left  Assessment: Satisfactory vascular status Diabetic Sensory and motor neuropathy Hyperpronation Gait disturbance Mild dropfoot left  Plan: Debridement of toenails 6-10 mechanically without any bleeding Patient  ambulates with a roller walker  Reappoint 3 months

## 2017-10-07 NOTE — Patient Instructions (Signed)

## 2017-11-11 DIAGNOSIS — E1143 Type 2 diabetes mellitus with diabetic autonomic (poly)neuropathy: Secondary | ICD-10-CM | POA: Diagnosis not present

## 2017-11-11 DIAGNOSIS — Z7984 Long term (current) use of oral hypoglycemic drugs: Secondary | ICD-10-CM | POA: Diagnosis not present

## 2017-11-11 DIAGNOSIS — K3184 Gastroparesis: Secondary | ICD-10-CM | POA: Diagnosis not present

## 2017-11-11 DIAGNOSIS — R634 Abnormal weight loss: Secondary | ICD-10-CM | POA: Diagnosis not present

## 2017-11-11 DIAGNOSIS — R110 Nausea: Secondary | ICD-10-CM | POA: Diagnosis not present

## 2017-11-11 DIAGNOSIS — R1084 Generalized abdominal pain: Secondary | ICD-10-CM | POA: Diagnosis not present

## 2017-11-28 DIAGNOSIS — J449 Chronic obstructive pulmonary disease, unspecified: Secondary | ICD-10-CM | POA: Diagnosis not present

## 2017-11-28 DIAGNOSIS — E1165 Type 2 diabetes mellitus with hyperglycemia: Secondary | ICD-10-CM | POA: Diagnosis not present

## 2017-11-28 DIAGNOSIS — K219 Gastro-esophageal reflux disease without esophagitis: Secondary | ICD-10-CM | POA: Diagnosis not present

## 2017-11-28 DIAGNOSIS — I4891 Unspecified atrial fibrillation: Secondary | ICD-10-CM | POA: Diagnosis not present

## 2017-11-28 DIAGNOSIS — R05 Cough: Secondary | ICD-10-CM | POA: Diagnosis not present

## 2017-11-28 DIAGNOSIS — Z7984 Long term (current) use of oral hypoglycemic drugs: Secondary | ICD-10-CM | POA: Diagnosis not present

## 2017-11-30 ENCOUNTER — Other Ambulatory Visit: Payer: Self-pay | Admitting: Internal Medicine

## 2017-12-05 ENCOUNTER — Encounter: Payer: Self-pay | Admitting: Internal Medicine

## 2017-12-05 ENCOUNTER — Encounter: Payer: Self-pay | Admitting: *Deleted

## 2017-12-05 ENCOUNTER — Ambulatory Visit (INDEPENDENT_AMBULATORY_CARE_PROVIDER_SITE_OTHER): Payer: Medicare Other | Admitting: Internal Medicine

## 2017-12-05 VITALS — BP 104/60 | HR 80 | Ht 66.5 in | Wt 235.0 lb

## 2017-12-05 DIAGNOSIS — J9611 Chronic respiratory failure with hypoxia: Secondary | ICD-10-CM

## 2017-12-05 DIAGNOSIS — F17210 Nicotine dependence, cigarettes, uncomplicated: Secondary | ICD-10-CM

## 2017-12-05 DIAGNOSIS — J449 Chronic obstructive pulmonary disease, unspecified: Secondary | ICD-10-CM | POA: Diagnosis not present

## 2017-12-05 NOTE — Patient Instructions (Addendum)
No change in medications   The key is to stop smoking completely before smoking completely stops you!   Please schedule a follow up visit in 6  months but call sooner if needed

## 2017-12-05 NOTE — Progress Notes (Signed)
Subjective:    Patient ID: Paul [name], male    DOB: 1941/02/01  MRN: [id number]    Brief patient profile:  20 yowm  active smoker with dx of copd with worse symptoms since 2013 referred to pulmonary clinic 12/24/2012 by Dr Darcus Austin for COPD evaluation with GOLD II criteria May 2014    History of Present Illness  12/24/2012 1st pulmonary eval  On advair/ spiriva longterm with freq use of proventil prior to dx  PE presenting as pleurtic L CP May 2013 with "moderate clot burden" to  [facility] assoc with PAF and started on Xarelto and baseline doe x climbing steps but could walk flat all day long before and after PE but then indolent onset doe progressively worse x 9 months assoc with cough congestion > green mucus better p on levaquin x one week but convinced his coughing and breathing problems are all due to xarelto because of what he read on the package. rec Only use your albuterol (proaire) as a rescue medication (plan B)  to be used if you can't catch your breath   The key is to stop smoking completely before smoking completely stops you           08/05/2017  f/u ov/Felisia Balcom re:   COPD II/ still smoking < 10 per day maint symb/spiriva  Chief Complaint  Patient presents with  . Follow-up    Breathing is overall doing well. He states he is coughing more since he has been home from the nursing home. His cough is prod with brown sputum. He is using his proair 4 x daily on average.   symbicort 160/spiriva 2.5  Worse breathing when not at home = stuffy nose cough not an issue while living at rehab  Convinced it's the formaldehyde in the house and only smokes outside  preferes allegra over zyrtec  MMRC3 = can't walk 100 yards even at a slow pace at a flat grade s stopping due to sob  / 02 4lpm hs  Really no change doe or cough x last year unless leaves house, then improved  02 4lpm hs/ no am ha/ feels sleeps well  rec The key is to stop smoking completely before smoking completely stops you!   Breathe clear air - best to crack the windows  Please schedule a follow up visit in 4  months but call sooner if needed    Had to cancel airline trip to [facility] Nov 23 2016 due to copd flare   12/05/2017  f/u ov/Kylyn Mcdade re:  COPD III / still smoking on symb / spriva 2 pff each am  Chief Complaint  Patient presents with  . Follow-up    Cough has been worse over the past 2 wks. He is coughing up some green sputum.  He is asking for a note for travel restrictions due to having to cancel a flight recently.    02 at 4lpm sleep ok / exertion none at rest   finising levaquin now and improving back to baseline   No obvious day to day or daytime variability or assoc excess/ purulent sputum or mucus plugs or hemoptysis or cp or chest tightness, subjective wheeze or overt sinus or hb symptoms. No unusual exposure hx or h/o childhood pna/ asthma or knowledge of premature birth.  Sleeping ok on 4lpm  without nocturnal  or early am exacerbation  of respiratory  c/o's or need for noct saba. Also denies any obvious fluctuation of symptoms with weather or  environmental changes or other aggravating or alleviating factors except as outlined above   Current Allergies, Complete Past Medical History, Past Surgical History, Family History, and Social History were reviewed in [employer] record.  ROS  The following are not active complaints unless bolded Hoarseness, sore throat, dysphagia, dental problems, itching, sneezing,  nasal congestion or discharge of excess mucus or purulent secretions, ear ache,   fever, chills, sweats, unintended wt loss or wt gain, classically pleuritic or exertional cp,  orthopnea pnd or leg swelling, presyncope, palpitations, abdominal pain, anorexia, nausea, vomiting, diarrhea  or change in bowel habits or change in bladder habits, change in stools or change in urine, dysuria, hematuria,  rash, arthralgias, visual complaints, headache, numbness, weakness or ataxia  or problems with walking or coordination,  change in mood/affect or memory.        Current Meds  Medication Sig  . acetaminophen (TYLENOL) 500 MG tablet Take 1,000 mg by mouth daily.   Marland Kitchen albuterol (PROAIR HFA) 108 (90 BASE) MCG/ACT inhaler Inhale 2 puffs into the lungs every 2 (two) hours as needed for wheezing or shortness of breath.   Marland Kitchen aspirin 81 MG tablet Take 81 mg by mouth daily.  Marland Kitchen atorvastatin (LIPITOR) 40 MG tablet Take 1 tablet (40 mg total) by mouth daily. (Patient taking differently: Take 40 mg by mouth at bedtime. )  . budesonide-formoterol (SYMBICORT) 160-4.5 MCG/ACT inhaler Take 2 puffs first thing in am and then another 2 puffs about 12 hours later.  . calcium carbonate (OS-CAL) 600 MG TABS tablet Take 600 mg by mouth daily with supper.   . fexofenadine (ALLEGRA) 180 MG tablet Take 180 mg by mouth daily with breakfast.   . fluticasone (FLONASE) 50 MCG/ACT nasal spray Place 1 spray into the nose 2 (two) times daily.   . furosemide (LASIX) 20 MG tablet TAKE 1 TABLET BY MOUTH  DAILY  . gabapentin (NEURONTIN) 300 MG capsule 4 at bedtime  . glimepiride (AMARYL) 2 MG tablet Take 2 mg by mouth 2 (two) times daily.   Marland Kitchen guaiFENesin (MUCINEX) 600 MG 12 hr tablet Take 1,200 mg by mouth daily with breakfast.   . losartan (COZAAR) 50 MG tablet Take 1 tablet (50 mg total) by mouth daily. (Patient taking differently: Take 50 mg by mouth daily with breakfast. )  . metFORMIN (GLUCOPHAGE) 1000 MG tablet Take 1,000 mg by mouth 2 (two) times daily with a meal.    . metoCLOPramide (REGLAN) 10 MG tablet Take 5 mg by mouth 3 (three) times daily before meals.   . Multiple Vitamins-Minerals (CENTRUM SILVER ADULT 50+) TABS Take 1 tablet by mouth daily with breakfast.  . omeprazole (PRILOSEC) 20 MG capsule Take 20 mg by mouth daily.  . OXYGEN 4L of oxygen at night and 3L of oxygen when walking long distances  . Tiotropium Bromide Monohydrate (SPIRIVA RESPIMAT) 2.5 MCG/ACT AERS USE 2 PUFFS EVERY MORNING   . Vitamin D, Ergocalciferol, (DRISDOL) 50000 UNITS CAPS capsule Take 50,000 Units by mouth every 14 (fourteen) days.               Objective:   Physical Exam  02/05/2013  Wt 266 > 03/26/2013  269 > 06/21/2014 261 > 08/04/2014  256 >257 03/07/2015 > 09/05/2015  254 > 01/26/2016  250 > 08/01/2016  248 > 01/29/2017   250   > 05/01/2017   248  > 08/05/2017   249 > 12/05/2017   235     amb obese wm with 2  wheeled walker   Vital signs reviewed - Note on arrival 02 sats  96% on RA        Min late  exp rhonchi bilaterally better with plm    HEENT: nl dentition, turbinates bilaterally, and oropharynx. Nl external ear canals without cough reflex   NECK :  without JVD/Nodes/TM/ nl carotid upstrokes bilaterally   LUNGS: no acc muscle use,  Nl contour chest which is min exp rhonchi bilaterally / no  Wheeze of cough    CV:  RRR  no s3 or murmur or increase in P2, and no edema   ABD:  Quite obese but  nontender with nl inspiratory excursion in the supine position. No bruits or organomegaly appreciated, bowel sounds nl  MS:  Nl gait/ ext warm without deformities, calf tenderness, cyanosis or clubbing No obvious joint restrictions   SKIN: warm and dry without lesions    NEURO:  alert, approp, nl sensorium with  no motor or cerebellar deficits apparent.                 Assessment & Plan:

## 2017-12-08 ENCOUNTER — Encounter: Payer: Self-pay | Admitting: Internal Medicine

## 2017-12-08 NOTE — Assessment & Plan Note (Signed)
04/05/15 SATURATION QUALIFICATIONS: (This note is used to comply with regulatory documentation for home oxygen) Patient Saturations on Room Air at Rest = 95% Patient Saturations on Room Air while Ambulating = 88% Patient Saturations on 2 Liters of oxygen while Ambulating = 93% Please briefly explain why patient needs home oxygen: desats with ambulation - 01/26/2016  Patient Saturations on Room Air at Rest =83%--increased to 93% 3lpm pulsed o2 - 05/10/17  Repeat ono on 3lpm >  33 min desat at < 89% so 05/17/2017  rec  4lpm and repeat ono on 4lpm  - ONO on 3lpm 05/23/17  desat 14 m @ <89% rec 4lpm as prev instructed .> repeated on 4lpm 06/05/17 and still 48 m at < 89% so rec continue 4lpm / consider sleep study next ov  As of 12/05/2017 rec  4lpm hs/   titrate to > 90% walking

## 2017-12-08 NOTE — Assessment & Plan Note (Signed)
-  PFT's 03/12/13 FEV1  2.27 (79%) ratio 52 and no better p B2 DLCO 45 and 60% p correction - Ex desats documented 06/21/14 > refused 02 > agreed to accept 03/26/15 see chronic resp failure  - 01/26/2016  rx symbicort instead of advair - PFT's  08/01/2016  FEV1 2.03  (76 % ) ratio 57  p 8 % improvement from saba p symbicort 160 x one puff  prior to study with DLCO  37/39 % corrects to 42  % for alv volume  > increase symb 160 2bid  - added spiriva [facility]  01/29/2017  - 08/05/2017  After extensive coaching HFA effectiveness =    90%   Back near baseline sp flare related to purulent tracheobronchitis > no change rx needed  Each maintenance medication was reviewed in detail including most importantly the difference between maintenance and as needed and under what circumstances the prns are to be used.  Please see AVS for specific  Instructions which are unique to this visit and I personally typed out  which were reviewed in detail in writing with the patient and a copy provided.

## 2017-12-08 NOTE — Assessment & Plan Note (Signed)
>   3 min Discussed the risks and costs (both direct and indirect)  of smoking relative to the benefits of quitting but patient unwilling to commit at this point to a specific quit date.    Although I don't endorse regular use of e cigs/ many pts find them helpful; however, I emphasized they should be considered a "one-way bridge" off all tobacco products.

## 2018-01-01 DIAGNOSIS — E7800 Pure hypercholesterolemia, unspecified: Secondary | ICD-10-CM | POA: Diagnosis not present

## 2018-01-01 DIAGNOSIS — M810 Age-related osteoporosis without current pathological fracture: Secondary | ICD-10-CM | POA: Diagnosis not present

## 2018-01-01 DIAGNOSIS — E1165 Type 2 diabetes mellitus with hyperglycemia: Secondary | ICD-10-CM | POA: Diagnosis not present

## 2018-01-01 DIAGNOSIS — F17210 Nicotine dependence, cigarettes, uncomplicated: Secondary | ICD-10-CM | POA: Diagnosis not present

## 2018-01-01 DIAGNOSIS — I10 Essential (primary) hypertension: Secondary | ICD-10-CM | POA: Diagnosis not present

## 2018-01-01 DIAGNOSIS — E1143 Type 2 diabetes mellitus with diabetic autonomic (poly)neuropathy: Secondary | ICD-10-CM | POA: Diagnosis not present

## 2018-01-01 DIAGNOSIS — I4891 Unspecified atrial fibrillation: Secondary | ICD-10-CM | POA: Diagnosis not present

## 2018-01-01 DIAGNOSIS — J449 Chronic obstructive pulmonary disease, unspecified: Secondary | ICD-10-CM | POA: Diagnosis not present

## 2018-01-04 ENCOUNTER — Other Ambulatory Visit: Payer: Self-pay | Admitting: Family Medicine

## 2018-01-04 DIAGNOSIS — F17210 Nicotine dependence, cigarettes, uncomplicated: Secondary | ICD-10-CM

## 2018-01-13 ENCOUNTER — Ambulatory Visit: Payer: Medicare Other | Admitting: Podiatry

## 2018-01-14 ENCOUNTER — Ambulatory Visit (INDEPENDENT_AMBULATORY_CARE_PROVIDER_SITE_OTHER): Payer: Medicare Other | Admitting: Podiatry

## 2018-01-14 DIAGNOSIS — M79609 Pain in unspecified limb: Secondary | ICD-10-CM | POA: Diagnosis not present

## 2018-01-14 DIAGNOSIS — B351 Tinea unguium: Secondary | ICD-10-CM | POA: Diagnosis not present

## 2018-01-14 DIAGNOSIS — E1140 Type 2 diabetes mellitus with diabetic neuropathy, unspecified: Secondary | ICD-10-CM

## 2018-01-14 NOTE — Progress Notes (Signed)
Subjective: 77 y.o. returns the office today for painful, elongated, thickened toenails which he cannot trim himself. Denies any redness or drainage around the nails. Denies any acute changes since last appointment and no new complaints today. Denies any systemic complaints such as fevers, chills, nausea, vomiting.   PCP: Darcus Austin, MD   Objective: AAO 3, NAD DP/PT pulses palpable 2/4, CRT less than 3 seconds Protective sensation decreased with Simms Weinstein monofilament Nails hypertrophic, dystrophic, elongated, brittle, discolored 10. There is tenderness overlying the nails 1-5 bilaterally. There is no surrounding erythema or drainage along the nail sites. Minimal hyperkeratotic lesion to bilateral medial midfoot plantar, no ulceration redness or signs of infection.  No open lesions or pre-ulcerative lesions are identified. Significant flatfoot present.  No other areas of tenderness bilateral lower extremities. No overlying edema, erythema, increased warmth. No pain with calf compression, swelling, warmth, erythema.  Assessment: Patient presents with symptomatic onychomycosis  Plan: -Treatment options including alternatives, risks, complications were discussed -Nails sharply debrided 10 without complication/bleeding. -He does not want the calluses trimmed. Moisturizer to the feet daily.  -Discussed daily foot inspection. If there are any changes, to call the office immediately.  -Follow-up in 3 months or sooner if any problems are to arise. In the meantime, encouraged to call the office with any questions, concerns, changes symptoms.  Celesta Gentile, DPM

## 2018-01-16 ENCOUNTER — Ambulatory Visit
Admission: RE | Admit: 2018-01-16 | Discharge: 2018-01-16 | Disposition: A | Discharge status: Home / Self Care | Payer: Medicare Other | Source: Ambulatory Visit | Attending: Family Medicine | Admitting: Family Medicine

## 2018-01-16 DIAGNOSIS — F17210 Nicotine dependence, cigarettes, uncomplicated: Secondary | ICD-10-CM

## 2018-01-25 ENCOUNTER — Other Ambulatory Visit: Payer: Self-pay | Admitting: Internal Medicine

## 2018-02-22 ENCOUNTER — Other Ambulatory Visit: Payer: Self-pay | Admitting: Neurology

## 2018-02-23 IMAGING — CR DG TIBIA/FIBULA 2V*L*
2 series · 2 of 2 positions shown · non-contrast
Comparison: None.

CLINICAL DATA: Fall, pain from the knee to the ankle, deformity.

EXAM:
LEFT TIBIA AND FIBULA - 2 VIEW

[x tib-fib ap left]
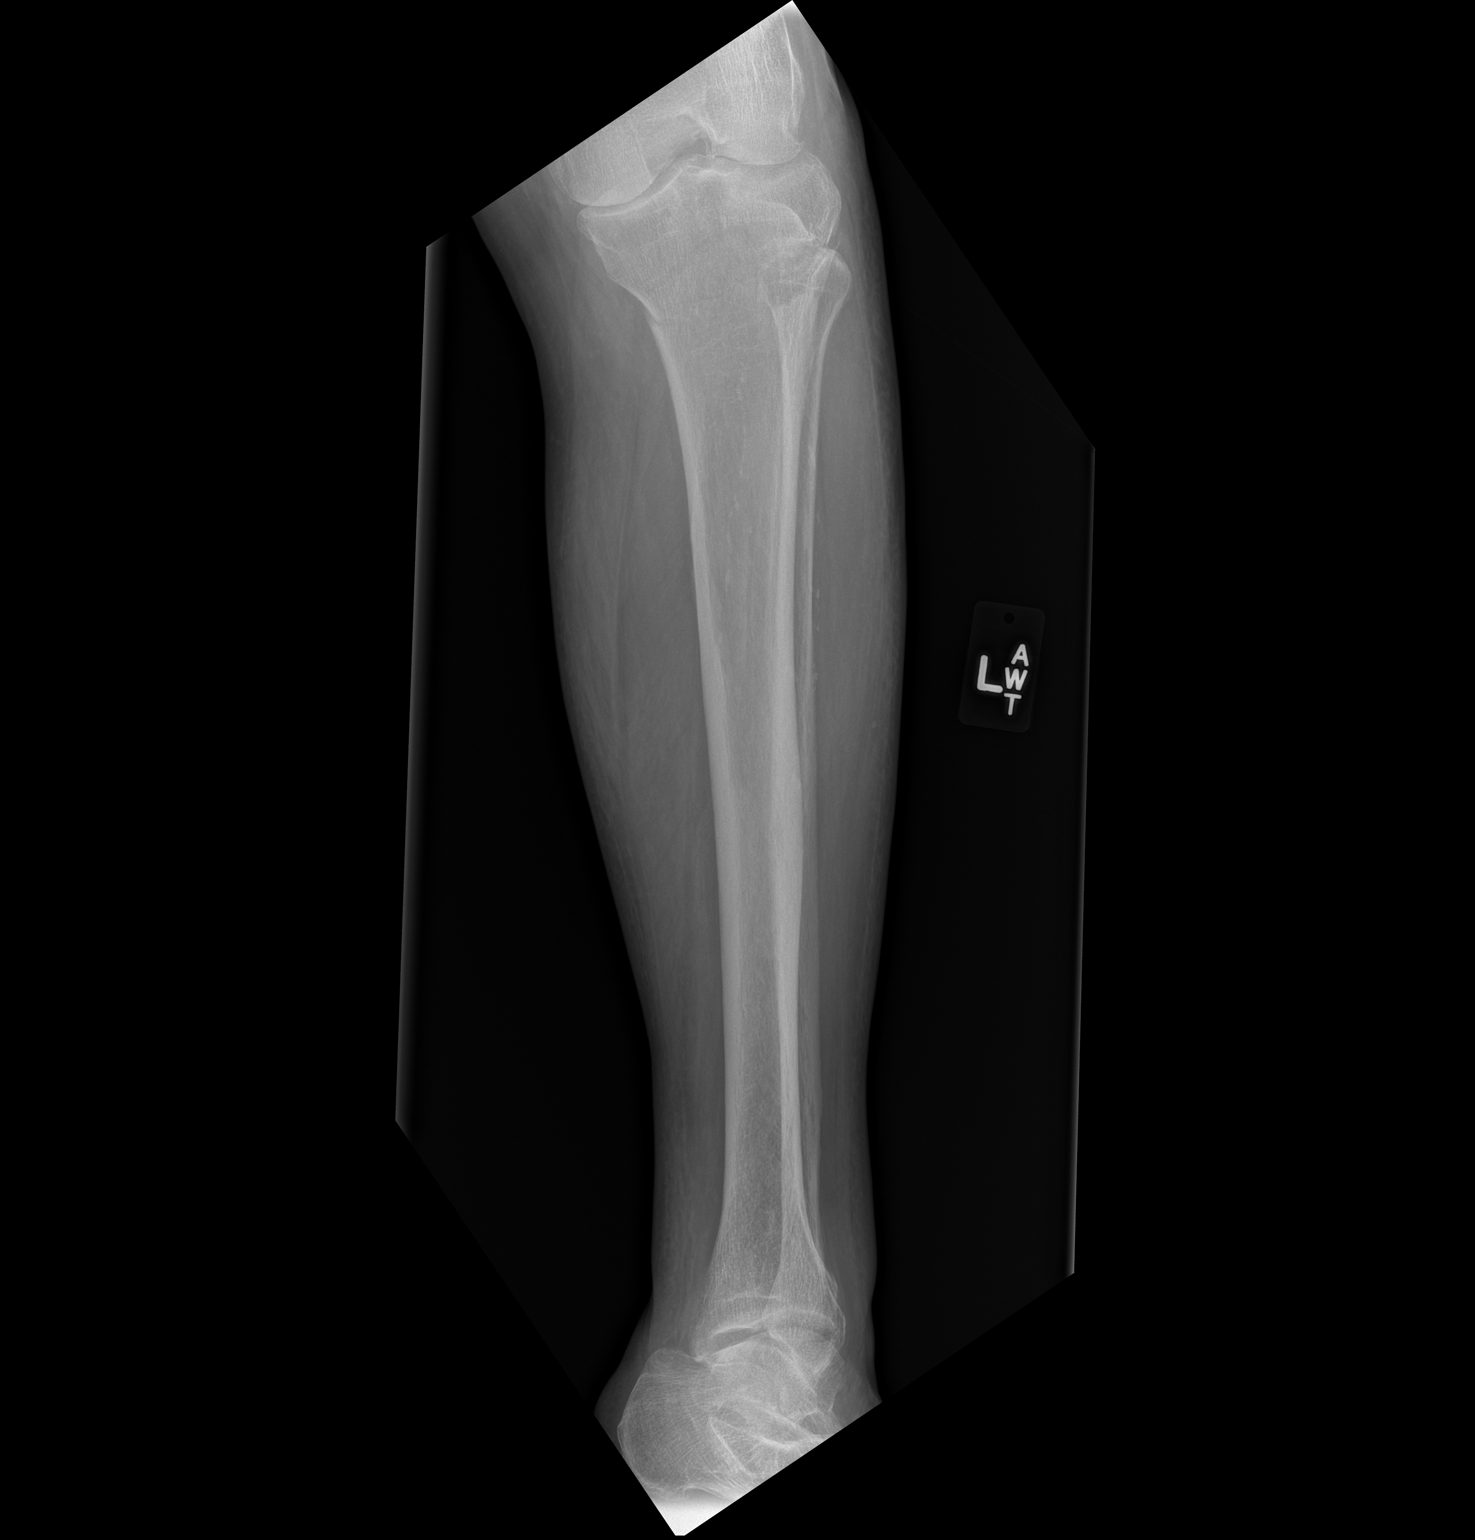

[x tib-fib lat left]
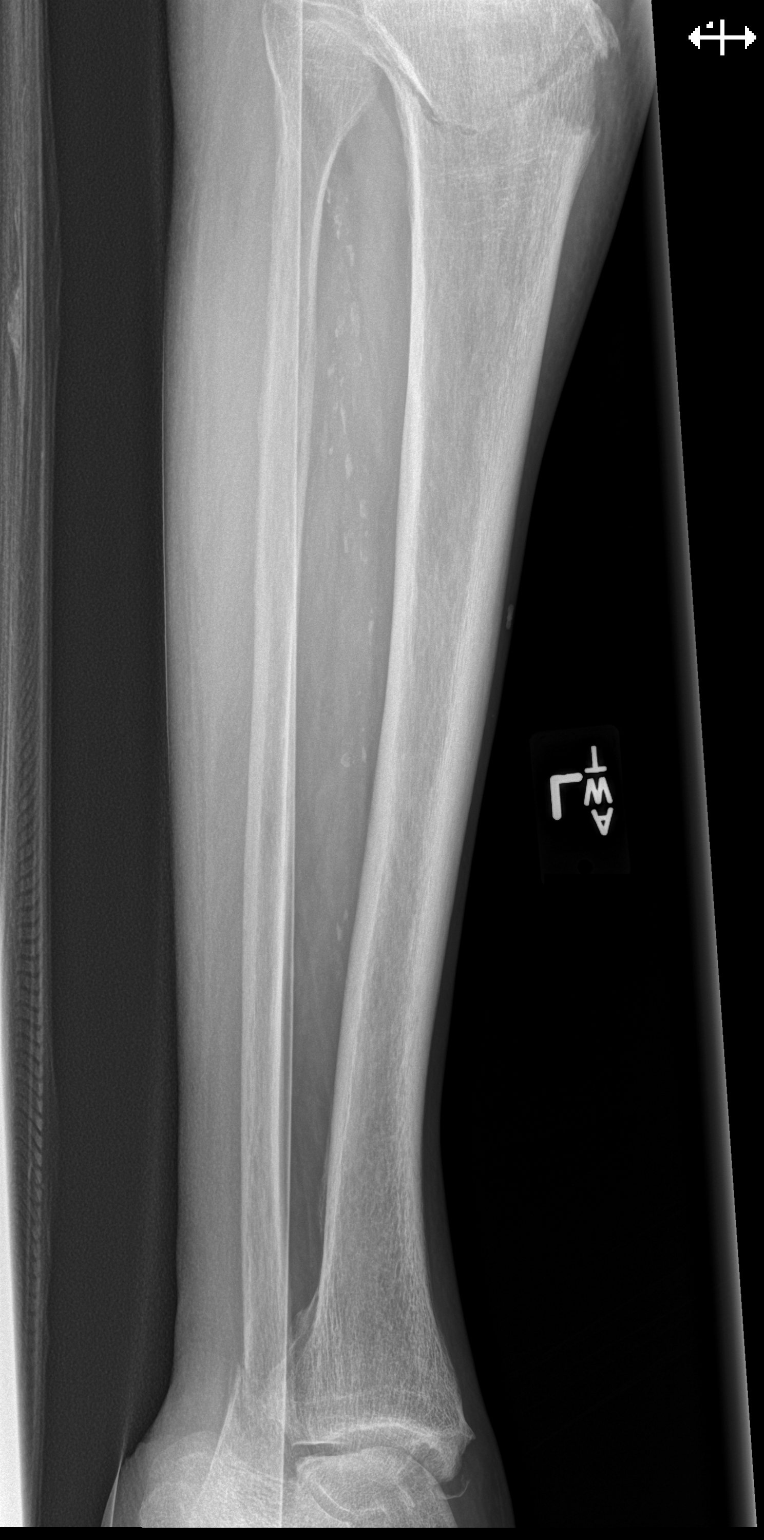

[2 of 2 positions shown; findings below may reference images not displayed]

FINDINGS: There is a minimally displaced fracture of the proximal tibial
metaphysis, incompletely imaged on the lateral view. A nondisplaced
fracture of the distal fibular metaphysis is seen as well.
IMPRESSION: 1. Minimally displaced proximal tibial metaphyseal fracture,
suboptimally imaged. Dedicated views of the left knee are
recommended in further initial evaluation. If a more aggressive
approach is desired, CT without contrast is suggested.
2. Distal fibular metaphyseal fracture.

## 2018-02-24 IMAGING — CT CT KNEE*L* W/O CM
4 of 5 series · 13 of 35 positions shown, 15 images · non-contrast
Comparison: Radiographs 08/08/2016.

CLINICAL DATA: Evaluate proximal tibial fracture.

EXAM:
CT OF THE left KNEE WITHOUT CONTRAST
TECHNIQUE: Multidetector CT imaging of the left knee was performed according to
the standard protocol. Multiplanar CT image reconstructions were
also generated.

[Series 4: knee st · axial · 0.42mm/px · z∈[-254,-134]mm · 3 of 121 slices shown, 4 images]
[im 31/121  soft-tissue]
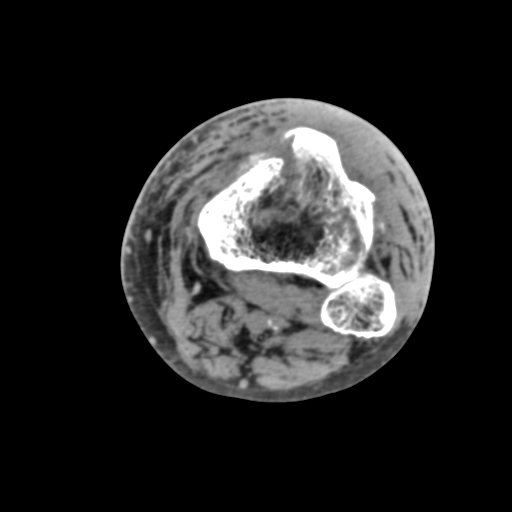
[im 31/121  bone]
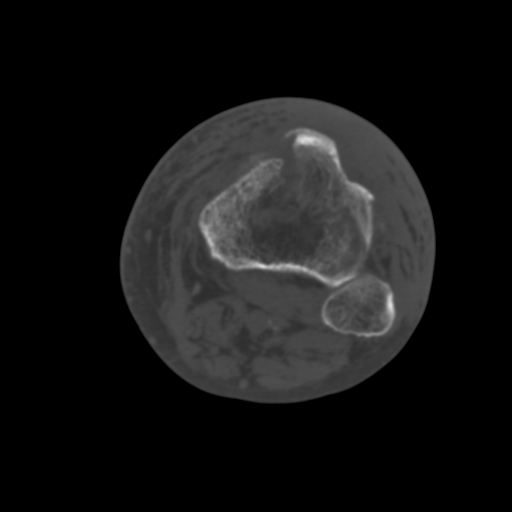
[im 61/121  bone]
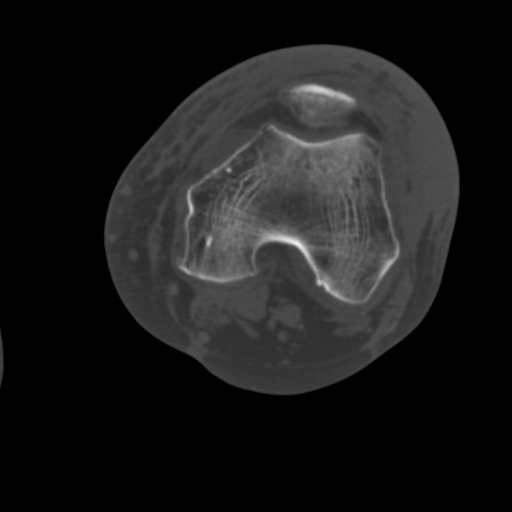
[im 91/121  bone]
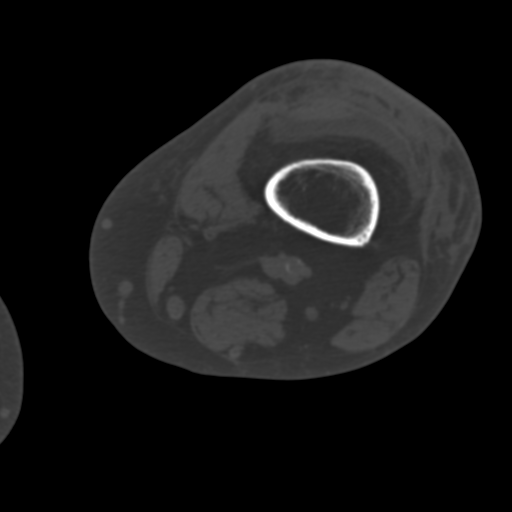

[Series 7: coronal bone · coronal · 0.31mm/px · 3 of 81 slices shown]
[im 17/81  bone]
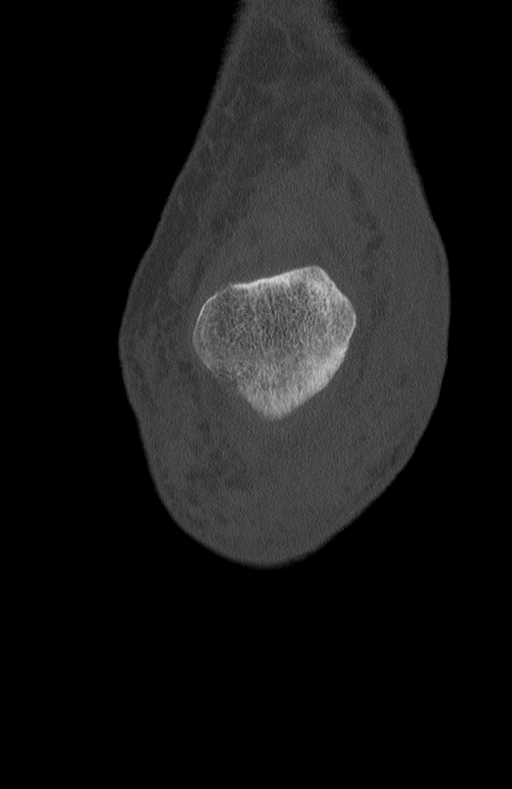
[im 33/81  bone]
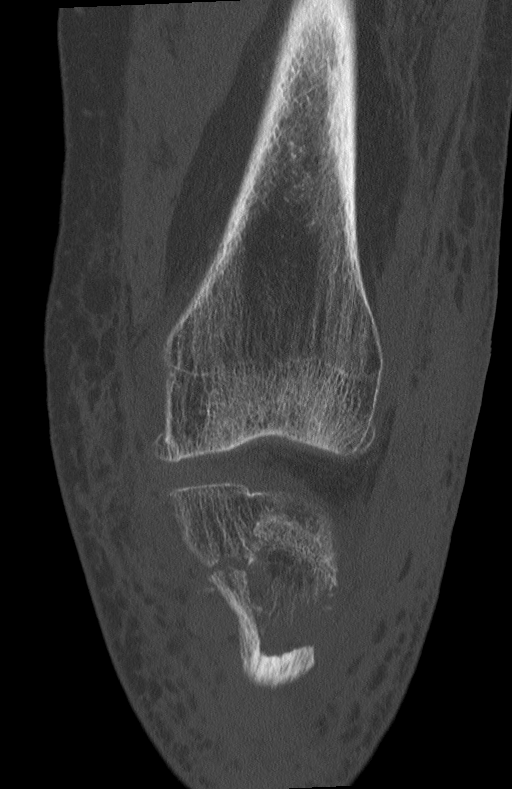
[im 49/81  bone]
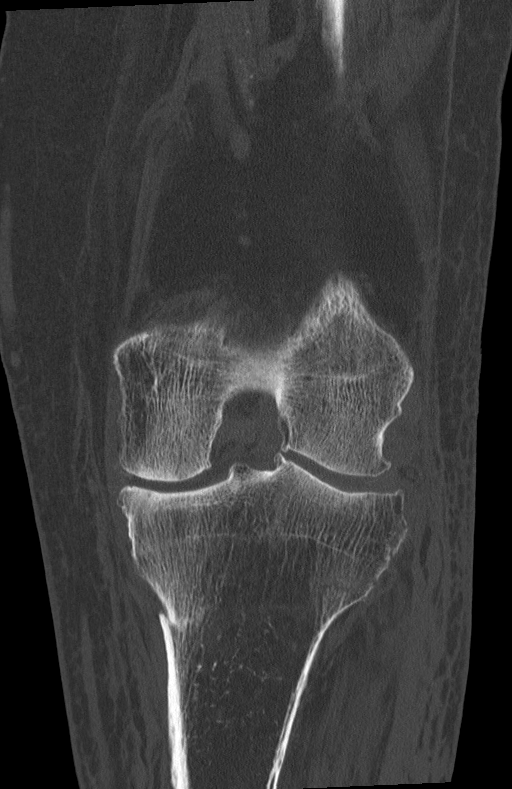

[Series 8: sagittal bone · sagittal · 0.32mm/px · 5 of 84 slices shown, 6 images]
[im 28/84  bone]
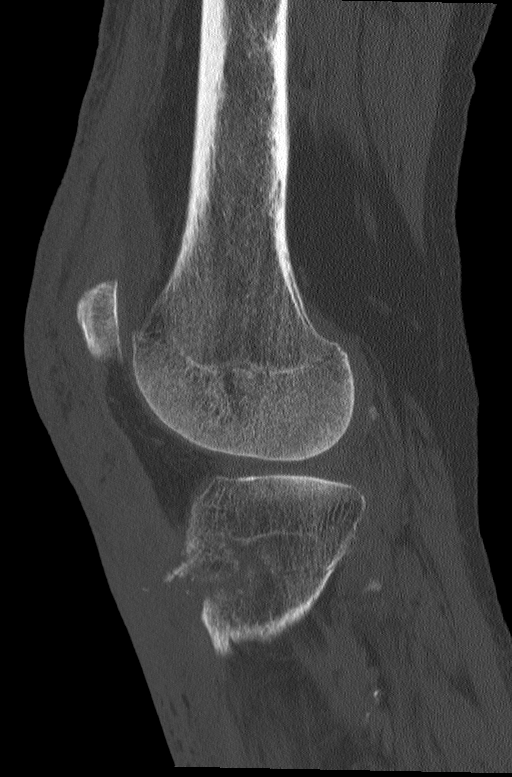
[im 35/84  bone]
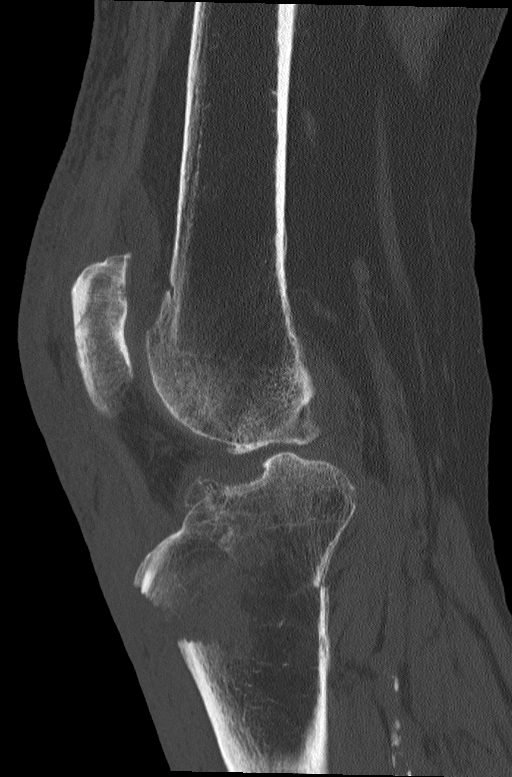
[im 42/84  soft-tissue]
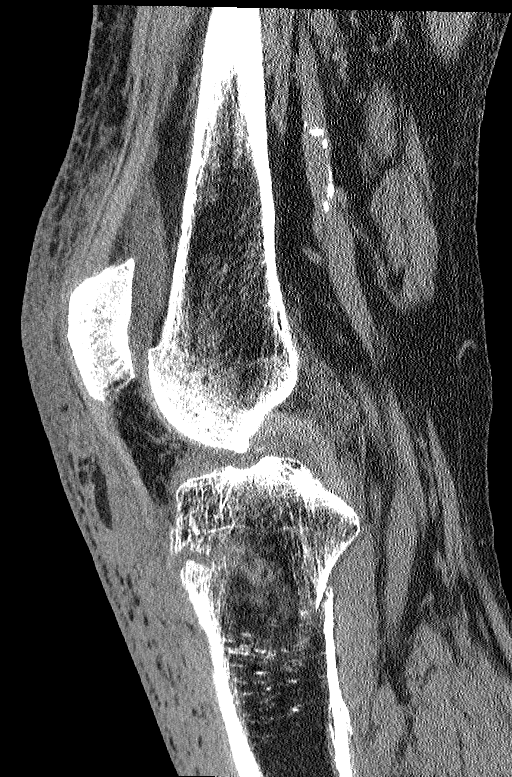
[im 42/84  bone]
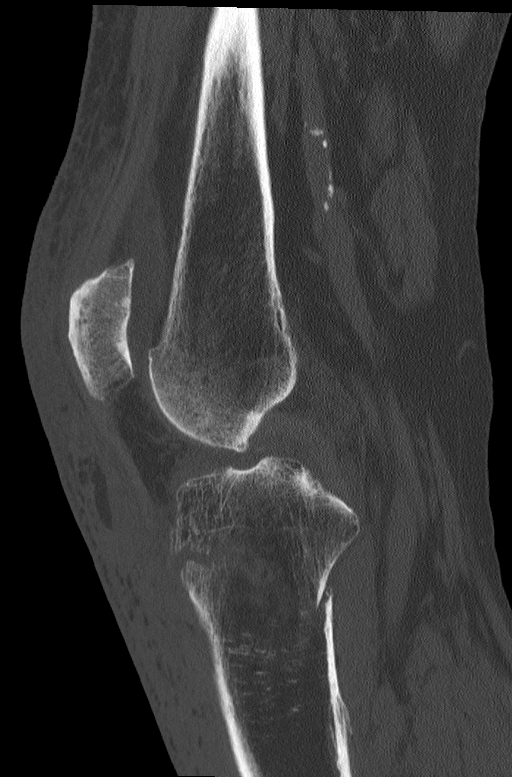
[im 49/84  bone]
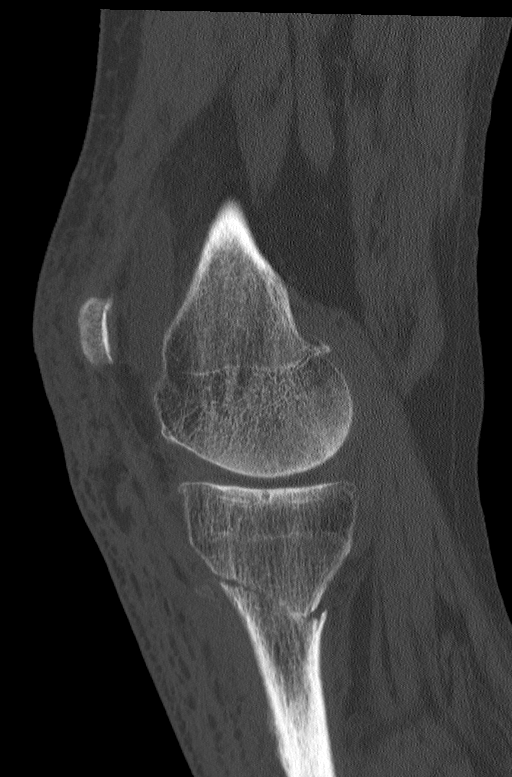
[im 56/84  bone]
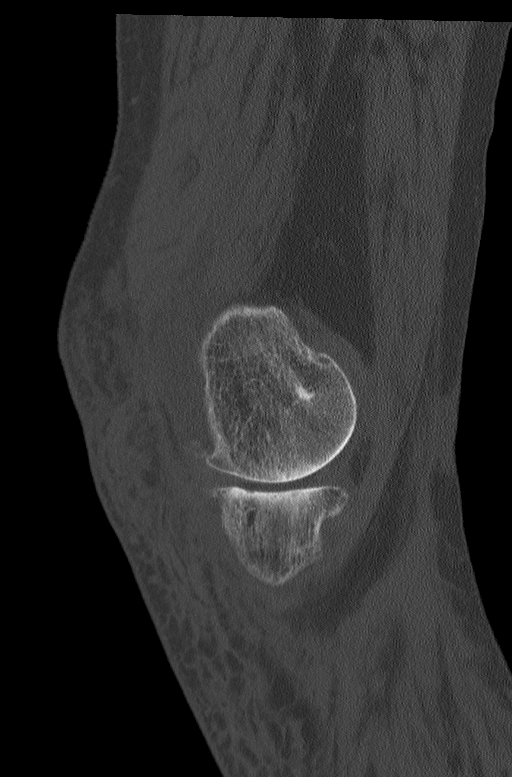

[Series 9: axial bone · axial · 0.30mm/px · z∈[-251,-192]mm · 2 of 120 slices shown]
[im 30/120  bone]
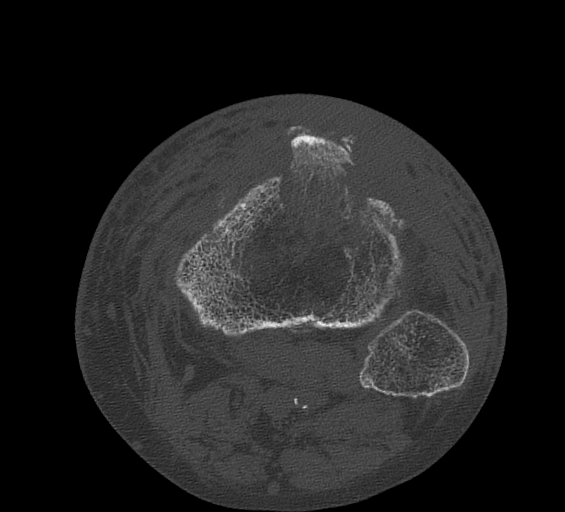
[im 60/120  bone]
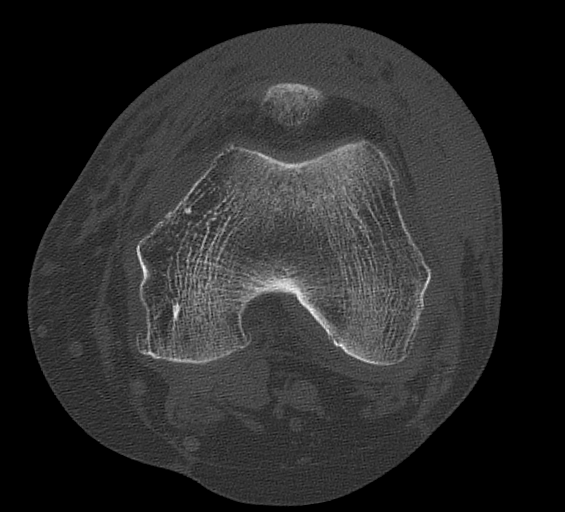

[13 of 35 positions shown; findings below may reference images not displayed]

FINDINGS: Bones/Joint/Cartilage

There is the largely transverse fracture through the metaphyseal
region of the tibia. I do not see definite involvement of the
articular surface of the tibia. Anteriorly the fracture courses
through the base of the tibial tuberosity and because of the pull of
the patellar tendon this segment of the fracture is displaced/the
avulsed. Maximum displacement is 17 mm.

Small joint effusion.  Mild degenerative changes.

The femur is intact.  No patella fracture.  The fibula is intact.

Ligaments

Suboptimally assessed by CT.

Muscles and Tendons

The quadriceps and patellar tendons appear to be intact. No
significant or obvious muscle injury.

Soft tissues

Significant subcutaneous soft tissue swelling/ edema/ hematoma
involving the anterior aspect of the.
IMPRESSION: 1. Largely transverse fracture through the metaphyseal region of the
proximal tibia within a displaced/avulsed segment of the tibial
tuberosity by the pull of the patellar tendon.
2. No evidence of involvement of the tibial plateau.
3. Small joint effusion.

## 2018-03-03 DIAGNOSIS — M810 Age-related osteoporosis without current pathological fracture: Secondary | ICD-10-CM | POA: Diagnosis not present

## 2018-04-12 ENCOUNTER — Other Ambulatory Visit: Payer: Self-pay | Admitting: Internal Medicine

## 2018-04-17 ENCOUNTER — Ambulatory Visit: Payer: Medicare Other | Admitting: Podiatry

## 2018-04-18 ENCOUNTER — Encounter: Payer: Self-pay | Admitting: Podiatry

## 2018-04-18 ENCOUNTER — Ambulatory Visit (INDEPENDENT_AMBULATORY_CARE_PROVIDER_SITE_OTHER): Payer: Medicare Other | Admitting: Podiatry

## 2018-04-18 DIAGNOSIS — J209 Acute bronchitis, unspecified: Secondary | ICD-10-CM | POA: Diagnosis not present

## 2018-04-18 DIAGNOSIS — E1142 Type 2 diabetes mellitus with diabetic polyneuropathy: Secondary | ICD-10-CM

## 2018-04-18 DIAGNOSIS — B351 Tinea unguium: Secondary | ICD-10-CM

## 2018-04-18 DIAGNOSIS — M79675 Pain in left toe(s): Secondary | ICD-10-CM

## 2018-04-18 DIAGNOSIS — M79674 Pain in right toe(s): Secondary | ICD-10-CM | POA: Diagnosis not present

## 2018-04-18 NOTE — Progress Notes (Signed)
  Subjective: Paul [name] is seen today  complaining of toenails are uncomfortable walking wearing shoes and requests toenail debridement. Patient has a history of fracture of left leg resulting in surgical reduction and physical therapy in patient. At this time patient is able to walk slowly with a walker and is improving over time Patient is diabetic with a history of neuropathy  Objective:  77 y.o. Male in NAD. Orientated 3  Vascular: Mild pitting edema left No calf pain or calf edema bilaterally DP pulses 2/4 bilaterally PT pulses 2/4 bilaterally Capillary reflex immediate bilaterally  Neurological: Sensation to 10 g monofilament wire intact 1/5 right 2/5 left Vibratory sensation nonreactive bilaterally Ankle reflex equal and reactive bilaterally  Dermatological: No open skin lesions bilaterally Prominent plantar first metatarsocuneiform without any callus formation on today. There is no erythema or warmth in this area.  Toenails 1-5 b/l  are discolored, brittle, deformed, elongated and tender to direct palpation   Musculoskeletal: Upon weight-bearing patient has extreme pes planus with the forefoot significantly abducted on the rear foot bilaterally Patient has slow unstable gait with maximum hyperpronation  Patient has diabetic shoes with custom insoles that contour well and in a good state of repair Manual motor testing plantar flexion 5/5 bilaterally Dorsi flexion 4/5 right and 3/5 left  Assessment: Satisfactory vascular status Diabetic Sensory and motor neuropathy Hyperpronation Gait disturbance Mild dropfoot left  Plan: Debridement of toenails x 10 mechanically Patient ambulates with a roller walker  Reappoint 3 months

## 2018-05-12 ENCOUNTER — Telehealth: Payer: Self-pay | Admitting: Internal Medicine

## 2018-05-12 NOTE — Telephone Encounter (Signed)
Spoke with pt's wife, Santiago Glad. Advised her that we do not have any documentation showing that our office called him. Nothing further was needed.

## 2018-05-16 DIAGNOSIS — J329 Chronic sinusitis, unspecified: Secondary | ICD-10-CM | POA: Diagnosis not present

## 2018-05-28 DIAGNOSIS — L309 Dermatitis, unspecified: Secondary | ICD-10-CM | POA: Diagnosis not present

## 2018-05-28 DIAGNOSIS — J329 Chronic sinusitis, unspecified: Secondary | ICD-10-CM | POA: Diagnosis not present

## 2018-05-31 ENCOUNTER — Other Ambulatory Visit: Payer: Self-pay | Admitting: Cardiology

## 2018-05-31 DIAGNOSIS — I2510 Atherosclerotic heart disease of native coronary artery without angina pectoris: Secondary | ICD-10-CM

## 2018-06-09 ENCOUNTER — Ambulatory Visit: Payer: Medicare Other | Admitting: Internal Medicine

## 2018-07-01 ENCOUNTER — Ambulatory Visit (INDEPENDENT_AMBULATORY_CARE_PROVIDER_SITE_OTHER): Payer: Medicare Other | Admitting: Internal Medicine

## 2018-07-01 ENCOUNTER — Encounter: Payer: Self-pay | Admitting: Internal Medicine

## 2018-07-01 VITALS — BP 112/78 | HR 80 | Ht 67.0 in | Wt 222.4 lb

## 2018-07-01 DIAGNOSIS — F17210 Nicotine dependence, cigarettes, uncomplicated: Secondary | ICD-10-CM | POA: Diagnosis not present

## 2018-07-01 DIAGNOSIS — I2510 Atherosclerotic heart disease of native coronary artery without angina pectoris: Secondary | ICD-10-CM

## 2018-07-01 DIAGNOSIS — J449 Chronic obstructive pulmonary disease, unspecified: Secondary | ICD-10-CM

## 2018-07-01 DIAGNOSIS — J310 Chronic rhinitis: Secondary | ICD-10-CM

## 2018-07-01 NOTE — Patient Instructions (Addendum)
I emphasized that nasal steroids like flonase  have no immediate benefit in terms of improving symptoms.  To help them reached the target tissue, the patient should use Afrin two puffs every 12 hours applied one min before using the nasal steroids.  Afrin should be stopped after no more than 5 days.  If the symptoms worsen, Afrin can be restarted after 5 days off of therapy to prevent rebound congestion from overuse of Afrin.  I also emphasized that in no way are nasal steroids a concern in terms of "addiction".    Please see patient coordinator before you leave today  to schedule sinus CT and I will call you with results

## 2018-07-01 NOTE — Progress Notes (Signed)
Subjective:    Patient ID: Paul [name], male    DOB: May 15, 1941  MRN: [id number]    Brief patient profile:  13 yowm  active smoker with dx of copd with worse symptoms since 2013 referred to pulmonary clinic 12/24/2012 by Dr Darcus Austin for COPD evaluation with GOLD II criteria May 2014    History of Present Illness  12/24/2012 1st pulmonary eval  On advair/ spiriva longterm with freq use of proventil prior to dx  PE presenting as pleurtic L CP May 2013 with "moderate clot burden" to  [facility] assoc with PAF and started on Xarelto and baseline doe x climbing steps but could walk flat all day long before and after PE but then indolent onset doe progressively worse x 9 months assoc with cough congestion > green mucus better p on levaquin x one week but convinced his coughing and breathing problems are all due to xarelto because of what he read on the package. 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 Follow-up    Breathing is overall doing well. He states he is coughing more since he has been home from the nursing home. His cough is prod with brown sputum. He is using his proair 4 x daily on average.   symbicort 160/spiriva 2.5  Worse breathing when not at home = stuffy nose cough not an issue while living at rehab  Convinced it's the formaldehyde in the house and only smokes outside  preferes allegra over zyrtec  MMRC3 = can't walk 100 yards even at a slow pace at a flat grade s stopping due to sob  / 02 4lpm hs  Really no change doe or cough x last year unless leaves house, then improved  02 4lpm hs/ no am ha/ feels sleeps well  rec The key is to stop smoking completely before smoking completely stops you!   Breathe clear air - best to crack the windows  Please schedule a follow up visit in 4  months but call sooner if needed    Had to cancel airline trip to [facility] Nov 23 2016 due to copd flare   12/05/2017  f/u ov/Glynn Freas re:  COPD II / still smoking on symb / spriva 2 pff each am  Chief Complaint  Patient presents with  . Follow-up    Cough has been worse over the past 2 wks. He is coughing up some green sputum.  He is asking for a note for travel restrictions due to having to cancel a flight recently.    02 at 4lpm sleep ok / exertion none at rest   finising levaquin now and improving back to baseline rec No change in medications  The key is to stop smoking completely before smoking completely stops you!  Please schedule a follow up visit in 6  months but call sooner if needed     07/01/2018  f/u ov/Oreoluwa Gilmer re: still smoking  GOLD II Chief Complaint  Patient presents with  . Follow-up    Still has a productive cough but mucus is clear in color.   onset late June 2019 p exp to  cat cough assoc with worse nasal drainage 2 rounds of levaquin and flonase > no better    No obvious other patterns in  day to day or daytime variability or assoc  purulent sputum or mucus plugs or hemoptysis or cp or chest tightness, subjective wheeze or overt sinus or hb symptoms.     Also denies any obvious fluctuation of symptoms with weather or environmental changes or other aggravating or alleviating factors except as outlined above   No unusual exposure hx or h/o childhood pna/ asthma or knowledge of premature birth.  Current Allergies, Complete Past Medical History, Past Surgical History, Family History, and Social History were reviewed in [employer] record.  ROS  The following are not active complaints unless bolded Hoarseness, sore throat, dysphagia, dental problems, itching, sneezing,  nasal congestion or discharge of excess mucus or purulent secretions, ear ache,   fever,  chills, sweats, unintended wt loss or wt gain, classically pleuritic or exertional cp,  orthopnea pnd or arm/hand swelling  or leg swelling, presyncope, palpitations, abdominal pain, anorexia, nausea, vomiting, diarrhea  or change in bowel habits or change in bladder habits, change in stools or change in urine, dysuria, hematuria,  rash, arthralgias, visual complaints, headache, numbness, weakness or ataxia or problems with walking or coordination,  change in mood or  memory.        Current Meds  Medication Sig  . acetaminophen (TYLENOL) 500 MG tablet Take 1,000 mg by mouth daily.   Marland Kitchen albuterol (PROAIR HFA) 108 (90 BASE) MCG/ACT inhaler Inhale 2 puffs into the lungs every 2 (two) hours as needed for wheezing or shortness of breath.   Marland Kitchen aspirin 81 MG tablet Take 81 mg by mouth daily.  Marland Kitchen atorvastatin (LIPITOR) 40 MG tablet Take 1 tablet (40 mg total) by mouth at bedtime.  . budesonide-formoterol (SYMBICORT) 160-4.5 MCG/ACT inhaler Take 2 puffs first thing in am and then another 2 puffs about 12 hours later.  . calcium carbonate (OS-CAL) 600 MG TABS tablet Take 600 mg by mouth daily with supper.   . fexofenadine (ALLEGRA) 180 MG tablet Take 180 mg by mouth daily with breakfast.   . fluticasone (FLONASE) 50 MCG/ACT nasal spray Place 1 spray into the nose 2 (two) times daily.   . furosemide (LASIX) 20 MG tablet TAKE 1 TABLET BY MOUTH  DAILY  . gabapentin (NEURONTIN) 300 MG capsule 4 at bedtime  . glimepiride (AMARYL) 2 MG tablet Take 2 mg by mouth 2 (two) times daily.   Marland Kitchen guaiFENesin (MUCINEX) 600 MG 12 hr tablet Take 1,200 mg by mouth daily with breakfast.   . losartan (COZAAR) 50 MG tablet Take 1 tablet (50 mg total) by mouth daily. (Patient taking differently: Take 50 mg by mouth daily with breakfast. )  . metFORMIN (GLUCOPHAGE) 1000 MG tablet Take 1,000 mg by mouth 2 (two) times daily with a meal.    . metoCLOPramide (REGLAN) 10 MG tablet Take 5 mg by mouth 3 (three) times daily before meals.   .  Multiple Vitamins-Minerals (CENTRUM SILVER ADULT 50+) TABS Take 1 tablet by mouth daily with breakfast.  . omeprazole (PRILOSEC) 20 MG capsule Take 20 mg by mouth daily.  . OXYGEN 4L of oxygen at night and 3L of oxygen when walking long distances  . Tiotropium Bromide Monohydrate (SPIRIVA RESPIMAT) 2.5 MCG/ACT AERS USE 2 PUFFS EVERY MORNING  . Vitamin D, Ergocalciferol, (DRISDOL) 50000 UNITS CAPS capsule Take 50,000 Units by mouth every 14 (fourteen) days.  Objective:   Physical Exam  02/05/2013  Wt 266 > 03/26/2013  269 > 06/21/2014 261 > 08/04/2014  256 >257 03/07/2015 > 09/05/2015  254 > 01/26/2016  250 > 08/01/2016  248 > 01/29/2017   250   > 05/01/2017   248  > 08/05/2017   249 > 12/05/2017   235  > 07/01/2018   222     amb obese wm nad    Vital signs reviewed - Note on arrival 02 sats  96% on RA     07/01/2018    Min late  exp rhonchi bilaterally better with plm  :  Quite obese     HEENT: nl dentition / oropharynx. Nl external ear canals without cough reflex -  Mild/mod bilateral non-specific turbinate edema     NECK :  without JVD/Nodes/TM/ nl carotid upstrokes bilaterally   LUNGS: no acc muscle use,  Mild barrel  contour chest wall with min late exp rhonchi bilaterally better with plm  and  without cough on insp or exp maneuver and mild  Hyperresonant  to  percussion bilaterally     CV:  RRR  no s3 or murmur or increase in P2, and no edema   ABD:  Quite obese  with pos mid insp Hoover's  in the supine position. No bruits or organomegaly appreciated, bowel sounds nl  MS:   Nl gait/  ext warm without deformities, calf tenderness, cyanosis or clubbing No obvious joint restrictions   SKIN: warm and dry without lesions    NEURO:  alert, approp, nl sensorium with  no motor or cerebellar deficits apparent.                   Assessment & Plan:

## 2018-07-03 DIAGNOSIS — I10 Essential (primary) hypertension: Secondary | ICD-10-CM | POA: Diagnosis not present

## 2018-07-03 DIAGNOSIS — D692 Other nonthrombocytopenic purpura: Secondary | ICD-10-CM | POA: Diagnosis not present

## 2018-07-03 DIAGNOSIS — F17210 Nicotine dependence, cigarettes, uncomplicated: Secondary | ICD-10-CM | POA: Diagnosis not present

## 2018-07-03 DIAGNOSIS — J449 Chronic obstructive pulmonary disease, unspecified: Secondary | ICD-10-CM | POA: Diagnosis not present

## 2018-07-03 DIAGNOSIS — M810 Age-related osteoporosis without current pathological fracture: Secondary | ICD-10-CM | POA: Diagnosis not present

## 2018-07-03 DIAGNOSIS — E1165 Type 2 diabetes mellitus with hyperglycemia: Secondary | ICD-10-CM | POA: Diagnosis not present

## 2018-07-03 DIAGNOSIS — E7800 Pure hypercholesterolemia, unspecified: Secondary | ICD-10-CM | POA: Diagnosis not present

## 2018-07-03 DIAGNOSIS — I4891 Unspecified atrial fibrillation: Secondary | ICD-10-CM | POA: Diagnosis not present

## 2018-07-03 DIAGNOSIS — E559 Vitamin D deficiency, unspecified: Secondary | ICD-10-CM | POA: Diagnosis not present

## 2018-07-03 DIAGNOSIS — E1143 Type 2 diabetes mellitus with diabetic autonomic (poly)neuropathy: Secondary | ICD-10-CM | POA: Diagnosis not present

## 2018-07-03 DIAGNOSIS — Z0000 Encounter for general adult medical examination without abnormal findings: Secondary | ICD-10-CM | POA: Diagnosis not present

## 2018-07-04 ENCOUNTER — Encounter: Payer: Self-pay | Admitting: Internal Medicine

## 2018-07-04 NOTE — Assessment & Plan Note (Signed)
-  PFT's 03/12/13 FEV1  2.27 (79%) ratio 52 and no better p B2 DLCO 45 and 60% p correction - Ex desats documented 06/21/14 > refused 02 > agreed to accept 03/26/15 see chronic resp failure  - 01/26/2016  rx symbicort instead of advair - PFT's  08/01/2016  FEV1 2.03  (76 % ) ratio 57  p 8 % improvement from saba p symbicort 160 x one puff  prior to study with DLCO  37/39 % corrects to 42  % for alv volume  > increase symb 160 2bid  - added spiriva [facility]  01/29/2017  - 08/05/2017  After extensive coaching HFA effectiveness =    90%   Despite smoking and poorly controlled rhinitis > rel well compensated but still  Group D in terms of symptom/risk and laba/lama/ICS  therefore appropriate rx at this point so no change spiriva/symbicort needed

## 2018-07-04 NOTE — Assessment & Plan Note (Addendum)
Multiple rounds of abx/ flonase > no better > rec sinus CT next avail but hold further abx   In meantime reviewed:  I emphasized that nasal steroids have no immediate benefit in terms of improving symptoms.  To help them reached the target tissue, the patient should use Afrin two puffs every 12 hours applied one min before using the nasal steroids.  Afrin should be stopped after no more than 5 days.  If the symptoms worsen, Afrin can be restarted after 5 days off of therapy to prevent rebound congestion from overuse of Afrin.  I also emphasized that in no way are nasal steroids a concern in terms of "addiction".

## 2018-07-04 NOTE — Assessment & Plan Note (Signed)
4-5 min discussion re active cigarette smoking in addition to office E&M  Ask about tobacco use:   ongoing Advise quitting   I emphasized that although we never turn away smokers from the pulmonary clinic, we do ask that they understand that the recommendations that we make  won't work nearly as well in the presence of continued cigarette exposure. In fact, we may very well  reach a point where we can't promise to help the patient if he/she can't quit smoking. (We can and will promise to try to help, we just can't promise what we recommend will really work)  Assess willingness:  Not committed at this point Assist in quit attempt:  Per PCP when ready Arrange follow up:   Follow up per Primary Care planned

## 2018-07-08 ENCOUNTER — Ambulatory Visit (INDEPENDENT_AMBULATORY_CARE_PROVIDER_SITE_OTHER)
Admission: RE | Admit: 2018-07-08 | Discharge: 2018-07-08 | Disposition: A | Discharge status: Home / Self Care | Payer: Medicare Other | Source: Ambulatory Visit | Attending: Internal Medicine | Admitting: Internal Medicine

## 2018-07-08 DIAGNOSIS — J3489 Other specified disorders of nose and nasal sinuses: Secondary | ICD-10-CM | POA: Diagnosis not present

## 2018-07-08 DIAGNOSIS — J310 Chronic rhinitis: Secondary | ICD-10-CM | POA: Diagnosis not present

## 2018-07-09 ENCOUNTER — Other Ambulatory Visit: Payer: Self-pay | Admitting: Internal Medicine

## 2018-07-09 DIAGNOSIS — R05 Cough: Secondary | ICD-10-CM

## 2018-07-09 DIAGNOSIS — R059 Cough, unspecified: Secondary | ICD-10-CM

## 2018-07-09 MED ORDER — AMOXICILLIN-POT CLAVULANATE 875-125 MG PO TABS: 1.0000 | ORAL_TABLET | Freq: Two times a day (BID) | ORAL | 0 refills | Status: DC

## 2018-07-09 NOTE — Progress Notes (Signed)
Spoke with pt and notified of results per Dr. Wert. Pt verbalized understanding and denied any questions. 

## 2018-07-19 DIAGNOSIS — M79675 Pain in left toe(s): Secondary | ICD-10-CM | POA: Diagnosis not present

## 2018-07-25 ENCOUNTER — Telehealth: Payer: Self-pay | Admitting: *Deleted

## 2018-07-25 ENCOUNTER — Ambulatory Visit (INDEPENDENT_AMBULATORY_CARE_PROVIDER_SITE_OTHER): Payer: Medicare Other | Admitting: Podiatry

## 2018-07-25 ENCOUNTER — Encounter: Payer: Self-pay | Admitting: Podiatry

## 2018-07-25 DIAGNOSIS — M79674 Pain in right toe(s): Secondary | ICD-10-CM

## 2018-07-25 DIAGNOSIS — M79675 Pain in left toe(s): Secondary | ICD-10-CM | POA: Diagnosis not present

## 2018-07-25 DIAGNOSIS — E1142 Type 2 diabetes mellitus with diabetic polyneuropathy: Secondary | ICD-10-CM

## 2018-07-25 DIAGNOSIS — B351 Tinea unguium: Secondary | ICD-10-CM

## 2018-07-25 DIAGNOSIS — S9032XA Contusion of left foot, initial encounter: Secondary | ICD-10-CM

## 2018-07-25 NOTE — Telephone Encounter (Signed)
-----  Message from [place] sent at 07/25/2018 11:30 AM EDT ----- Regarding: Get xray and report Patient injured foot last Thurs and went to [facility] clinic - Dr. Elisha Ponder would like to get a copy of those xrays and report to review please!  Thanks!

## 2018-07-25 NOTE — Telephone Encounter (Signed)
Bethann Humble - Records Coordinator will get the required clinical data.

## 2018-07-26 ENCOUNTER — Other Ambulatory Visit: Payer: Self-pay | Admitting: Internal Medicine

## 2018-07-26 ENCOUNTER — Other Ambulatory Visit: Payer: Self-pay | Admitting: Cardiology

## 2018-07-26 DIAGNOSIS — I2510 Atherosclerotic heart disease of native coronary artery without angina pectoris: Secondary | ICD-10-CM

## 2018-07-26 DIAGNOSIS — J449 Chronic obstructive pulmonary disease, unspecified: Secondary | ICD-10-CM

## 2018-07-27 ENCOUNTER — Encounter: Payer: Self-pay | Admitting: Podiatry

## 2018-07-27 NOTE — Progress Notes (Signed)
Subjective: Paul [name] presents today for diabetic foot care. He has been routinely seen for painful, discolored, thick toenails. Pain is relieved with periodic professional debridement.  He relates a traumatic incident on September 5th in which he was trying to place his shower chair in the shower and hit his left foot on the 8 inch tile riser. He immediately experienced pain followed by swelling of the left foot. He went to [facility]. He had an xray performed and fracture was ruled out. He has been icing daily for 15 minutes and elevates foot when resting.  He is wearing his house slippers and using a walker.  Objective: 77 y.o. Male, morbidly obese in NAD. AAO x 3.  Vascular Examination: Capillary refill time <3 seconds x 10 digits Dorsalis pedis and Posterior tibial pulses present b/l No digital hair x 10 diits Skin temperature gradient WNL b/l Mild swelling left foot  Dermatological Examination: Ecchymosis noted left forefoot area from 1st MPJ to 4th MPJ; ecchymosis also extending from left hallux proximally to 1st metatarsal shaft  Toenails 1-5 b/l discolored, thick, dystrophic with subungual debris and pain with palpation to nailbeds due to thickness of nails.  Musculoskeletal: Muscle strength 5/5 to all LE muscle groups  He is still able to move his digits on his left foot with no pain.  There is a mild amount of tenderness to forefoot area with palpation.  Neurological: Sensation diminished with 10 gram monofilament. Vibratory sensation diminished  Assessment: 1. Painful onychomycosis toenails 1-5 b/l 2. Contusion left foot 3. NIDDM with Diabetic neuropathy  Plan: 1. Pictures taken of his left foot and placed in chart on today. I suggested we have an xray on today, but Mr. Jolliff refuses; suggested Darco stiff soled shoe to minimize foot motion  which he also refuses on today. He states if foot pain increases he will come in for another xray. We will  request xrays/report from [facility] to place in his chart. 2. Continue diabetic foot care principles.  3. Toenails 1-5 b/l were debrided in length and girth without iatrogenic bleeding. 4. Patient to continue soft, supportive shoe gear 5. Patient to report any pedal injuries to medical professional  6. Follow up 3 months for routine foot care. He is to call if his left foot has any increased pain, swelling, or discoloration in the interim.

## 2018-07-29 DIAGNOSIS — D169 Benign neoplasm of bone and articular cartilage, unspecified: Secondary | ICD-10-CM | POA: Diagnosis not present

## 2018-07-29 DIAGNOSIS — M19011 Primary osteoarthritis, right shoulder: Secondary | ICD-10-CM | POA: Diagnosis not present

## 2018-07-29 DIAGNOSIS — D1601 Benign neoplasm of scapula and long bones of right upper limb: Secondary | ICD-10-CM | POA: Diagnosis not present

## 2018-07-29 DIAGNOSIS — D492 Neoplasm of unspecified behavior of bone, soft tissue, and skin: Secondary | ICD-10-CM | POA: Diagnosis not present

## 2018-07-30 ENCOUNTER — Ambulatory Visit (INDEPENDENT_AMBULATORY_CARE_PROVIDER_SITE_OTHER)
Admission: RE | Admit: 2018-07-30 | Discharge: 2018-07-30 | Disposition: A | Discharge status: Home / Self Care | Payer: Medicare Other | Source: Ambulatory Visit | Attending: Internal Medicine | Admitting: Internal Medicine

## 2018-07-30 DIAGNOSIS — R05 Cough: Secondary | ICD-10-CM

## 2018-07-30 DIAGNOSIS — J329 Chronic sinusitis, unspecified: Secondary | ICD-10-CM | POA: Diagnosis not present

## 2018-07-30 DIAGNOSIS — R059 Cough, unspecified: Secondary | ICD-10-CM

## 2018-07-31 ENCOUNTER — Other Ambulatory Visit: Payer: Self-pay | Admitting: Internal Medicine

## 2018-07-31 DIAGNOSIS — J310 Chronic rhinitis: Secondary | ICD-10-CM

## 2018-07-31 NOTE — Progress Notes (Signed)
Spoke with pt and notified of results per Dr. Melvyn Novas. Pt verbalized understanding and denied any questions.

## 2018-08-01 ENCOUNTER — Telehealth: Payer: Self-pay | Admitting: Internal Medicine

## 2018-08-01 NOTE — Telephone Encounter (Signed)
Spoke with patient, patient was aware of Appointment with Dr. Redmond Baseman (ENT) on the 26th at 940am. Patient did not know who called. Informed patient that if needed anything in the future to let us know. Nothing further needed at this time.

## 2018-08-07 DIAGNOSIS — J320 Chronic maxillary sinusitis: Secondary | ICD-10-CM | POA: Insufficient documentation

## 2018-08-07 DIAGNOSIS — J310 Chronic rhinitis: Secondary | ICD-10-CM | POA: Diagnosis not present

## 2018-08-12 DIAGNOSIS — Z23 Encounter for immunization: Secondary | ICD-10-CM | POA: Diagnosis not present

## 2018-09-15 ENCOUNTER — Other Ambulatory Visit: Payer: Self-pay | Admitting: Internal Medicine

## 2018-10-02 DIAGNOSIS — M25531 Pain in right wrist: Secondary | ICD-10-CM | POA: Diagnosis not present

## 2018-10-02 DIAGNOSIS — M533 Sacrococcygeal disorders, not elsewhere classified: Secondary | ICD-10-CM | POA: Diagnosis not present

## 2018-10-02 DIAGNOSIS — M25552 Pain in left hip: Secondary | ICD-10-CM | POA: Diagnosis not present

## 2018-10-04 ENCOUNTER — Other Ambulatory Visit: Payer: Self-pay | Admitting: Cardiology

## 2018-10-04 DIAGNOSIS — I2510 Atherosclerotic heart disease of native coronary artery without angina pectoris: Secondary | ICD-10-CM

## 2018-10-24 ENCOUNTER — Ambulatory Visit (INDEPENDENT_AMBULATORY_CARE_PROVIDER_SITE_OTHER): Payer: Medicare Other | Admitting: Podiatry

## 2018-10-24 DIAGNOSIS — M79675 Pain in left toe(s): Secondary | ICD-10-CM | POA: Diagnosis not present

## 2018-10-24 DIAGNOSIS — M79674 Pain in right toe(s): Secondary | ICD-10-CM

## 2018-10-24 DIAGNOSIS — B351 Tinea unguium: Secondary | ICD-10-CM

## 2018-10-24 NOTE — Patient Instructions (Signed)

## 2018-11-24 ENCOUNTER — Encounter: Payer: Self-pay | Admitting: Podiatry

## 2018-11-24 NOTE — Progress Notes (Signed)
Subjective: Paul [name] presents today with history of neuropathy with cc of painful, mycotic toenails.  Pain is aggravated when wearing enclosed shoe gear and relieved with periodic professional debridement.  He relates his left foot feels much better now.  Patient has peripheral neuropathy managed with gabapentin.   Current Outpatient Medications:  .  acetaminophen (TYLENOL) 500 MG tablet, Take 1,000 mg by mouth daily. , Disp: , Rfl:  .  albuterol (PROAIR HFA) 108 (90 BASE) MCG/ACT inhaler, Inhale 2 puffs into the lungs every 2 (two) hours as needed for wheezing or shortness of breath. , Disp: , Rfl:  .  amoxicillin-clavulanate (AUGMENTIN) 875-125 MG tablet, Take 1 tablet by mouth 2 (two) times daily., Disp: 40 tablet, Rfl: 0 .  aspirin 81 MG tablet, Take 81 mg by mouth daily., Disp: , Rfl:  .  atorvastatin (LIPITOR) 40 MG tablet, TAKE 1 TABLET BY MOUTH AT  BEDTIME, Disp: 90 tablet, Rfl: 0 .  budesonide-formoterol (SYMBICORT) 160-4.5 MCG/ACT inhaler, TAKE 2 PUFFS FIRST THING IN THE MORNING AND THEN  ANOTHER 2 PUFFS ABOUT 12  HOURS LATER., Disp: 30.6 g, Rfl: 3 .  Budesonide-Formoterol Fumarate (SYMBICORT IN), Symbicort, Disp: , Rfl:  .  calcium carbonate (OS-CAL) 600 MG TABS tablet, Take 600 mg by mouth daily with supper. , Disp: , Rfl:  .  calcium carbonate (OS-CAL) 600 MG TABS tablet, Take by mouth., Disp: , Rfl:  .  ergocalciferol (VITAMIN D2) 1.25 MG (50000 UT) capsule, Vitamin D2 1,250 mcg (50,000 unit) capsule, Disp: , Rfl:  .  fexofenadine (ALLEGRA) 180 MG tablet, Take 180 mg by mouth daily with breakfast. , Disp: , Rfl:  .  fluticasone (FLONASE) 50 MCG/ACT nasal spray, Place 1 spray into the nose 2 (two) times daily. , Disp: , Rfl:  .  Fluticasone-Salmeterol (ADVAIR) 100-50 MCG/DOSE AEPB, fluticasone 100 mcg-salmeterol 50 mcg/dose blistr powdr for inhalation, Disp: , Rfl:  .  furosemide (LASIX) 20 MG tablet, TAKE 1 TABLET BY MOUTH  DAILY, Disp: 90 tablet, Rfl: 0 .  gabapentin  (NEURONTIN) 300 MG capsule, 4 at bedtime, Disp: , Rfl:  .  GABAPENTIN PO, gabapentin, Disp: , Rfl:  .  glimepiride (AMARYL) 2 MG tablet, Take 2 mg by mouth 2 (two) times daily. , Disp: , Rfl:  .  guaiFENesin (MUCINEX) 600 MG 12 hr tablet, Take 1,200 mg by mouth daily with breakfast. , Disp: , Rfl:  .  HYDROcodone-acetaminophen (NORCO) 10-325 MG tablet, hydrocodone 10 mg-acetaminophen 325 mg tablet, Disp: , Rfl:  .  losartan (COZAAR) 50 MG tablet, Take 1 tablet (50 mg total) by mouth daily. (Patient taking differently: Take 50 mg by mouth daily with breakfast. ), Disp: 90 tablet, Rfl: 0 .  metFORMIN (GLUCOPHAGE) 1000 MG tablet, Take 1,000 mg by mouth 2 (two) times daily with a meal.  , Disp: , Rfl:  .  metoCLOPramide (REGLAN) 10 MG tablet, Take 5 mg by mouth 3 (three) times daily before meals. , Disp: , Rfl:  .  Multiple Vitamins-Minerals (CENTRUM SILVER 50+MEN) TABS, Take by mouth., Disp: , Rfl:  .  Multiple Vitamins-Minerals (CENTRUM SILVER ADULT 50+) TABS, Take 1 tablet by mouth daily with breakfast., Disp: , Rfl:  .  omeprazole (PRILOSEC) 20 MG capsule, Take 20 mg by mouth daily., Disp: , Rfl:  .  OXYGEN, 4L of oxygen at night and 3L of oxygen when walking long distances, Disp: , Rfl:  .  SPIRIVA RESPIMAT 2.5 MCG/ACT AERS, USE 2 SPRAYS (INHALATIONS)  EVERY MORNING, Disp: 12  g, Rfl: 3 .  Tiotropium Bromide Monohydrate (SPIRIVA RESPIMAT) 2.5 MCG/ACT AERS, USE 2 PUFFS EVERY MORNING, Disp: 3 Inhaler, Rfl: 3 .  Tiotropium Bromide Monohydrate (SPIRIVA RESPIMAT) 2.5 MCG/ACT AERS, Spiriva Respimat 2.5 mcg/actuation solution for inhalation, Disp: , Rfl:  .  Vitamin D, Ergocalciferol, (DRISDOL) 50000 UNITS CAPS capsule, Take 50,000 Units by mouth every 14 (fourteen) days. , Disp: , Rfl:   Allergies  Allergen Reactions  . Codeine Anaphylaxis  . Phenobarbital Hypertension    Objective:  Vascular Examination: Capillary refill time <3 seconds x 10 digits Dorsalis pedis and Posterior tibial pulses  present b/l Digital hair x 10 digits was absent Skin temperature gradient WNL b/l Trace edema b/l   Dermatological Examination: Skin with normal turgor, texture and tone b/l  Resolved ecchymosis left forefoot area.  Toenails 1-5 b/l discolored, thick, dystrophic with subungual debris and pain with palpation to nailbeds due to thickness of nails.  Incurvated nailplates b/l great toes. No edema, no erythema, no edema. Light bleeding when offending nail border debrided left hallux.  Musculoskeletal: Muscle strength 5/5 to all muscle groups b/l  Neurological: Sensation with 10 gram monofilament is absent b/l Vibratory sensation absent b/l  Assessment: 1. Painful onychomycosis toenails 1-5 b/l 2. NIDDM with neuropathy  Plan: 1. Toenails 1-5 b/l were debrided in length and girth without iatrogenic bleeding.  Offending nail border left hallux debrided and curretaged without complication. Light bleeding addressed with Lumicain. Triple antibiotic ointment applied. Patient instructed to apply Neosporin to digit once daily for one week. 2. Patient to continue soft, supportive shoe gear 3. Patient to report any pedal injuries to medical professional  4. Follow up 3 months. Patient/POA to call should there be a concern in the interim.

## 2018-11-25 DIAGNOSIS — E119 Type 2 diabetes mellitus without complications: Secondary | ICD-10-CM | POA: Diagnosis not present

## 2018-11-25 DIAGNOSIS — H524 Presbyopia: Secondary | ICD-10-CM | POA: Diagnosis not present

## 2018-11-25 DIAGNOSIS — H26493 Other secondary cataract, bilateral: Secondary | ICD-10-CM | POA: Diagnosis not present

## 2018-11-25 DIAGNOSIS — Z961 Presence of intraocular lens: Secondary | ICD-10-CM | POA: Diagnosis not present

## 2018-11-25 DIAGNOSIS — H52223 Regular astigmatism, bilateral: Secondary | ICD-10-CM | POA: Diagnosis not present

## 2018-12-02 DIAGNOSIS — R413 Other amnesia: Secondary | ICD-10-CM | POA: Diagnosis not present

## 2018-12-05 ENCOUNTER — Other Ambulatory Visit: Payer: Self-pay | Admitting: Internal Medicine

## 2018-12-23 DIAGNOSIS — H02831 Dermatochalasis of right upper eyelid: Secondary | ICD-10-CM | POA: Diagnosis not present

## 2018-12-23 DIAGNOSIS — Z961 Presence of intraocular lens: Secondary | ICD-10-CM | POA: Diagnosis not present

## 2018-12-23 DIAGNOSIS — H18413 Arcus senilis, bilateral: Secondary | ICD-10-CM | POA: Diagnosis not present

## 2018-12-23 DIAGNOSIS — H26491 Other secondary cataract, right eye: Secondary | ICD-10-CM | POA: Diagnosis not present

## 2018-12-31 DIAGNOSIS — Z9841 Cataract extraction status, right eye: Secondary | ICD-10-CM | POA: Diagnosis not present

## 2018-12-31 DIAGNOSIS — Z961 Presence of intraocular lens: Secondary | ICD-10-CM | POA: Diagnosis not present

## 2019-01-08 DIAGNOSIS — E1143 Type 2 diabetes mellitus with diabetic autonomic (poly)neuropathy: Secondary | ICD-10-CM | POA: Diagnosis not present

## 2019-01-08 DIAGNOSIS — J9611 Chronic respiratory failure with hypoxia: Secondary | ICD-10-CM | POA: Diagnosis not present

## 2019-01-23 ENCOUNTER — Other Ambulatory Visit: Payer: Self-pay

## 2019-01-23 ENCOUNTER — Ambulatory Visit (INDEPENDENT_AMBULATORY_CARE_PROVIDER_SITE_OTHER): Payer: Medicare Other | Admitting: Podiatry

## 2019-01-23 ENCOUNTER — Encounter: Payer: Self-pay | Admitting: Podiatry

## 2019-01-23 DIAGNOSIS — E1142 Type 2 diabetes mellitus with diabetic polyneuropathy: Secondary | ICD-10-CM

## 2019-01-23 DIAGNOSIS — M79675 Pain in left toe(s): Secondary | ICD-10-CM | POA: Diagnosis not present

## 2019-01-23 DIAGNOSIS — M79674 Pain in right toe(s): Secondary | ICD-10-CM

## 2019-01-23 DIAGNOSIS — B351 Tinea unguium: Secondary | ICD-10-CM | POA: Diagnosis not present

## 2019-01-23 NOTE — Patient Instructions (Signed)
Diabetes Mellitus and Foot Care Foot care is an important part of your health, especially when you have diabetes. Diabetes may cause you to have problems because of poor blood flow (circulation) to your feet and legs, which can cause your skin to:  Become thinner and drier.  Break more easily.  Heal more slowly.  Peel and crack. You may also have nerve damage (neuropathy) in your legs and feet, causing decreased feeling in them. This means that you may not notice minor injuries to your feet that could lead to more serious problems. Noticing and addressing any potential problems early is the best way to prevent future foot problems. How to care for your feet Foot hygiene  Wash your feet daily with warm water and mild soap. Do not use hot water. Then, pat your feet and the areas between your toes until they are completely dry. Do not soak your feet as this can dry your skin.  Trim your toenails straight across. Do not dig under them or around the cuticle. File the edges of your nails with an emery board or nail file.  Apply a moisturizing lotion or petroleum jelly to the skin on your feet and to dry, brittle toenails. Use lotion that does not contain alcohol and is unscented. Do not apply lotion between your toes. Shoes and socks  Wear clean socks or stockings every day. Make sure they are not too tight. Do not wear knee-high stockings since they may decrease blood flow to your legs.  Wear shoes that fit properly and have enough cushioning. Always look in your shoes before you put them on to be sure there are no objects inside.  To break in new shoes, wear them for just a few hours a day. This prevents injuries on your feet. Wounds, scrapes, corns, and calluses  Check your feet daily for blisters, cuts, bruises, sores, and redness. If you cannot see the bottom of your feet, use a mirror or ask someone for help.  Do not cut corns or calluses or try to remove them with medicine.  If you  find a minor scrape, cut, or break in the skin on your feet, keep it and the skin around it clean and dry. You may clean these areas with mild soap and water. Do not clean the area with peroxide, alcohol, or iodine.  If you have a wound, scrape, corn, or callus on your foot, look at it several times a day to make sure it is healing and not infected. Check for: ? Redness, swelling, or pain. ? Fluid or blood. ? Warmth. ? Pus or a bad smell. General instructions  Do not cross your legs. This may decrease blood flow to your feet.  Do not use heating pads or hot water bottles on your feet. They may burn your skin. If you have lost feeling in your feet or legs, you may not know this is happening until it is too late.  Protect your feet from hot and cold by wearing shoes, such as at the beach or on hot pavement.  Schedule a complete foot exam at least once a year (annually) or more often if you have foot problems. If you have foot problems, report any cuts, sores, or bruises to your health care provider immediately. Contact a health care provider if:  You have a medical condition that increases your risk of infection and you have any cuts, sores, or bruises on your feet.  You have an injury that is not   healing.  You have redness on your legs or feet.  You feel burning or tingling in your legs or feet.  You have pain or cramps in your legs and feet.  Your legs or feet are numb.  Your feet always feel cold.  You have pain around a toenail. Get help right away if:  You have a wound, scrape, corn, or callus on your foot and: ? You have pain, swelling, or redness that gets worse. ? You have fluid or blood coming from the wound, scrape, corn, or callus. ? Your wound, scrape, corn, or callus feels warm to the touch. ? You have pus or a bad smell coming from the wound, scrape, corn, or callus. ? You have a fever. ? You have a red line going up your leg. Summary  Check your feet every day  for cuts, sores, red spots, swelling, and blisters.  Moisturize feet and legs daily.  Wear shoes that fit properly and have enough cushioning.  If you have foot problems, report any cuts, sores, or bruises to your health care provider immediately.  Schedule a complete foot exam at least once a year (annually) or more often if you have foot problems. This information is not intended to replace advice given to you by your health care provider. Make sure you discuss any questions you have with your health care provider. Document Released: 10/26/2000 Document Revised: 12/11/2017 Document Reviewed: 11/30/2016 Elsevier Interactive Patient Education  2019 [employer]  Onychomycosis/Fungal Toenails  WHAT IS IT? An infection that lies within the keratin of your nail plate that is caused by a fungus.  WHY ME? Fungal infections affect all ages, sexes, races, and creeds.  There may be many factors that predispose you to a fungal infection such as age, coexisting medical conditions such as diabetes, or an autoimmune disease; stress, medications, fatigue, genetics, etc.  Bottom line: fungus thrives in a warm, moist environment and your shoes offer such a location.  IS IT CONTAGIOUS? Theoretically, yes.  You do not want to share shoes, nail clippers or files with someone who has fungal toenails.  Walking around barefoot in the same room or sleeping in the same bed is unlikely to transfer the organism.  It is important to realize, however, that fungus can spread easily from one nail to the next on the same foot.  HOW DO WE TREAT THIS?  There are several ways to treat this condition.  Treatment may depend on many factors such as age, medications, pregnancy, liver and kidney conditions, etc.  It is best to ask your doctor which options are available to you.  1. No treatment.   Unlike many other medical concerns, you can live with this condition.  However for many people this can be a painful condition and  may lead to ingrown toenails or a bacterial infection.  It is recommended that you keep the nails cut short to help reduce the amount of fungal nail. 2. Topical treatment.  These range from herbal remedies to prescription strength nail lacquers.  About 40-50% effective, topicals require twice daily application for approximately 9 to 12 months or until an entirely new nail has grown out.  The most effective topicals are medical grade medications available through physicians offices. 3. Oral antifungal medications.  With an 80-90% cure rate, the most common oral medication requires 3 to 4 months of therapy and stays in your system for a year as the new nail grows out.  Oral antifungal medications do require  blood work to make sure it is a safe drug for you.  A liver function panel will be performed prior to starting the medication and after the first month of treatment.  It is important to have the blood work performed to avoid any harmful side effects.  In general, this medication safe but blood work is required. 4. Laser Therapy.  This treatment is performed by applying a specialized laser to the affected nail plate.  This therapy is noninvasive, fast, and non-painful.  It is not covered by insurance and is therefore, out of pocket.  The results have been very good with a 80-95% cure rate.  [facility] is the only practice in the area to offer this therapy. 5. Permanent Nail Avulsion.  Removing the entire nail so that a new nail will not grow back.

## 2019-01-25 ENCOUNTER — Telehealth: Payer: Self-pay | Admitting: *Deleted

## 2019-01-25 NOTE — Telephone Encounter (Signed)
Left message letting patient know our office will be closed, on 01/26/2019, to disinfect the facility.  We will call back to reschedule the appointment.

## 2019-01-26 ENCOUNTER — Telehealth: Payer: Self-pay | Admitting: Neurology

## 2019-01-26 ENCOUNTER — Ambulatory Visit: Payer: Medicare Other | Admitting: Neurology

## 2019-01-26 NOTE — Telephone Encounter (Signed)
I have called patient, he has multiple vascular risk factors, coronary artery disease, hypertension hyperlipidemia, COPD, history of lung cancer, status post left lower lobectomy, he is referred by his primary care physician Dr. Pilar Plate for evaluation of memory loss,  I have asked him and his family to call back to reschedule.

## 2019-01-26 NOTE — Telephone Encounter (Signed)
Dr. Krista Blue requested patient's family to call back to reschedule.  Please follow up.

## 2019-02-04 NOTE — Progress Notes (Signed)
Subjective: Paul [name] presents today with history of neuropathy with cc of painful, mycotic toenails.  Pain is aggravated when wearing enclosed shoe gear and relieved with periodic professional debridement.  Patient has peripheral neuropathy managed with gabapentin.  Jonathon Jordan, MD is his PCP.    Current Outpatient Medications:  .  acetaminophen (TYLENOL) 500 MG tablet, Take 1,000 mg by mouth daily. , Disp: , Rfl:  .  albuterol (PROAIR HFA) 108 (90 BASE) MCG/ACT inhaler, Inhale 2 puffs into the lungs every 2 (two) hours as needed for wheezing or shortness of breath. , Disp: , Rfl:  .  amoxicillin-clavulanate (AUGMENTIN) 875-125 MG tablet, Take 1 tablet by mouth 2 (two) times daily., Disp: 40 tablet, Rfl: 0 .  aspirin 81 MG tablet, Take 81 mg by mouth daily., Disp: , Rfl:  .  atorvastatin (LIPITOR) 40 MG tablet, TAKE 1 TABLET BY MOUTH AT  BEDTIME, Disp: 90 tablet, Rfl: 0 .  budesonide-formoterol (SYMBICORT) 160-4.5 MCG/ACT inhaler, TAKE 2 PUFFS FIRST THING IN THE MORNING AND THEN  ANOTHER 2 PUFFS ABOUT 12  HOURS LATER., Disp: 30.6 g, Rfl: 3 .  Budesonide-Formoterol Fumarate (SYMBICORT IN), Symbicort, Disp: , Rfl:  .  calcium carbonate (OS-CAL) 600 MG TABS tablet, Take 600 mg by mouth daily with supper. , Disp: , Rfl:  .  calcium carbonate (OS-CAL) 600 MG TABS tablet, Take by mouth., Disp: , Rfl:  .  ergocalciferol (VITAMIN D2) 1.25 MG (50000 UT) capsule, Vitamin D2 1,250 mcg (50,000 unit) capsule, Disp: , Rfl:  .  fexofenadine (ALLEGRA) 180 MG tablet, Take 180 mg by mouth daily with breakfast. , Disp: , Rfl:  .  fluticasone (FLONASE) 50 MCG/ACT nasal spray, Place 1 spray into the nose 2 (two) times daily. , Disp: , Rfl:  .  Fluticasone-Salmeterol (ADVAIR) 100-50 MCG/DOSE AEPB, fluticasone 100 mcg-salmeterol 50 mcg/dose blistr powdr for inhalation, Disp: , Rfl:  .  furosemide (LASIX) 20 MG tablet, TAKE 1 TABLET BY MOUTH  DAILY, Disp: 90 tablet, Rfl: 0 .  gabapentin (NEURONTIN) 300  MG capsule, 4 at bedtime, Disp: , Rfl:  .  GABAPENTIN PO, gabapentin, Disp: , Rfl:  .  glimepiride (AMARYL) 2 MG tablet, Take 2 mg by mouth 2 (two) times daily. , Disp: , Rfl:  .  guaiFENesin (MUCINEX) 600 MG 12 hr tablet, Take 1,200 mg by mouth daily with breakfast. , Disp: , Rfl:  .  HYDROcodone-acetaminophen (NORCO) 10-325 MG tablet, hydrocodone 10 mg-acetaminophen 325 mg tablet, Disp: , Rfl:  .  losartan (COZAAR) 50 MG tablet, Take 1 tablet (50 mg total) by mouth daily. (Patient taking differently: Take 50 mg by mouth daily with breakfast. ), Disp: 90 tablet, Rfl: 0 .  metFORMIN (GLUCOPHAGE) 1000 MG tablet, Take 1,000 mg by mouth 2 (two) times daily with a meal.  , Disp: , Rfl:  .  metoCLOPramide (REGLAN) 10 MG tablet, Take 5 mg by mouth 3 (three) times daily before meals. , Disp: , Rfl:  .  Multiple Vitamins-Minerals (CENTRUM SILVER 50+MEN) TABS, Take by mouth., Disp: , Rfl:  .  Multiple Vitamins-Minerals (CENTRUM SILVER ADULT 50+) TABS, Take 1 tablet by mouth daily with breakfast., Disp: , Rfl:  .  omeprazole (PRILOSEC) 20 MG capsule, Take 20 mg by mouth daily., Disp: , Rfl:  .  OXYGEN, 4L of oxygen at night and 3L of oxygen when walking long distances, Disp: , Rfl:  .  SPIRIVA RESPIMAT 2.5 MCG/ACT AERS, USE 2 SPRAYS (INHALATIONS)  EVERY MORNING, Disp: 12 g, Rfl:  3 .  Tiotropium Bromide Monohydrate (SPIRIVA RESPIMAT) 2.5 MCG/ACT AERS, USE 2 PUFFS EVERY MORNING, Disp: 3 Inhaler, Rfl: 3 .  Tiotropium Bromide Monohydrate (SPIRIVA RESPIMAT) 2.5 MCG/ACT AERS, Spiriva Respimat 2.5 mcg/actuation solution for inhalation, Disp: , Rfl:  .  Vitamin D, Ergocalciferol, (DRISDOL) 50000 UNITS CAPS capsule, Take 50,000 Units by mouth every 14 (fourteen) days. , Disp: , Rfl:   Allergies  Allergen Reactions  . Codeine Anaphylaxis  . Phenobarbital Hypertension    Objective:  Vascular Examination: Capillary refill time <3 seconds x 10 digits.  Dorsalis pedis and Posterior tibial pulses present  b/l.  Digital hair x 10 digits was absent.  Skin temperature gradient WNL b/l.  Trace edema BLE   No pain with calf compression.  Dermatological Examination: Skin with normal turgor, texture and tone b/l.  Toenails 1-5 b/l discolored, thick, dystrophic with subungual debris and pain with palpation to nailbeds due to thickness of nails.Incurvated nailplate b/l great toes with tenderness to palpation. No erythema, no edema, no drainage noted.  Musculoskeletal: Muscle strength 5/5 to all muscle groups b/l.  Neurological: Sensation with 10 gram monofilament is absent b/l.  Vibratory sensation absent b/l.  Assessment: 1. Painful onychomycosis toenails 1-5 b/l 2. NIDDM with neuropathy  Plan: 1. Toenails 1-5 b/l were debrided in length and girth without iatrogenic bleeding.Offending nail borders debrided and curretaged b/l great toes. Borders cleansed with alcohol. Antibiotic ointment applied. No further treatment required by patient. 2. Patient to continue soft, supportive shoe gear 3. Patient to report any pedal injuries to medical professional  4. Follow up 3 months.  5. Patient/POA to call should there be a concern in the interim.

## 2019-05-01 ENCOUNTER — Ambulatory Visit: Payer: Medicare Other | Admitting: Podiatry

## 2019-07-09 DIAGNOSIS — E1143 Type 2 diabetes mellitus with diabetic autonomic (poly)neuropathy: Secondary | ICD-10-CM | POA: Diagnosis not present

## 2019-07-09 DIAGNOSIS — J9611 Chronic respiratory failure with hypoxia: Secondary | ICD-10-CM | POA: Diagnosis not present

## 2019-07-09 DIAGNOSIS — I10 Essential (primary) hypertension: Secondary | ICD-10-CM | POA: Diagnosis not present

## 2019-07-09 DIAGNOSIS — J449 Chronic obstructive pulmonary disease, unspecified: Secondary | ICD-10-CM | POA: Diagnosis not present

## 2019-07-09 DIAGNOSIS — Z79899 Other long term (current) drug therapy: Secondary | ICD-10-CM | POA: Diagnosis not present

## 2019-07-09 DIAGNOSIS — Z23 Encounter for immunization: Secondary | ICD-10-CM | POA: Diagnosis not present

## 2019-07-09 DIAGNOSIS — Z0000 Encounter for general adult medical examination without abnormal findings: Secondary | ICD-10-CM | POA: Diagnosis not present

## 2019-07-09 DIAGNOSIS — K219 Gastro-esophageal reflux disease without esophagitis: Secondary | ICD-10-CM | POA: Diagnosis not present

## 2019-07-09 DIAGNOSIS — Z85118 Personal history of other malignant neoplasm of bronchus and lung: Secondary | ICD-10-CM | POA: Diagnosis not present

## 2019-07-09 DIAGNOSIS — M810 Age-related osteoporosis without current pathological fracture: Secondary | ICD-10-CM | POA: Diagnosis not present

## 2019-07-09 DIAGNOSIS — F330 Major depressive disorder, recurrent, mild: Secondary | ICD-10-CM | POA: Diagnosis not present

## 2019-08-10 ENCOUNTER — Other Ambulatory Visit: Payer: Self-pay

## 2019-08-10 ENCOUNTER — Ambulatory Visit (INDEPENDENT_AMBULATORY_CARE_PROVIDER_SITE_OTHER): Payer: Medicare Other | Admitting: Podiatry

## 2019-08-10 ENCOUNTER — Encounter: Payer: Self-pay | Admitting: Podiatry

## 2019-08-10 DIAGNOSIS — M79675 Pain in left toe(s): Secondary | ICD-10-CM

## 2019-08-10 DIAGNOSIS — B351 Tinea unguium: Secondary | ICD-10-CM | POA: Diagnosis not present

## 2019-08-10 DIAGNOSIS — M79674 Pain in right toe(s): Secondary | ICD-10-CM

## 2019-08-10 DIAGNOSIS — E1142 Type 2 diabetes mellitus with diabetic polyneuropathy: Secondary | ICD-10-CM | POA: Diagnosis not present

## 2019-08-10 NOTE — Patient Instructions (Signed)
Diabetes Mellitus and Foot Care Foot care is an important part of your health, especially when you have diabetes. Diabetes may cause you to have problems because of poor blood flow (circulation) to your feet and legs, which can cause your skin to:  Become thinner and drier.  Break more easily.  Heal more slowly.  Peel and crack. You may also have nerve damage (neuropathy) in your legs and feet, causing decreased feeling in them. This means that you may not notice minor injuries to your feet that could lead to more serious problems. Noticing and addressing any potential problems early is the best way to prevent future foot problems. How to care for your feet Foot hygiene  Wash your feet daily with warm water and mild soap. Do not use hot water. Then, pat your feet and the areas between your toes until they are completely dry. Do not soak your feet as this can dry your skin.  Trim your toenails straight across. Do not dig under them or around the cuticle. File the edges of your nails with an emery board or nail file.  Apply a moisturizing lotion or petroleum jelly to the skin on your feet and to dry, brittle toenails. Use lotion that does not contain alcohol and is unscented. Do not apply lotion between your toes. Shoes and socks  Wear clean socks or stockings every day. Make sure they are not too tight. Do not wear knee-high stockings since they may decrease blood flow to your legs.  Wear shoes that fit properly and have enough cushioning. Always look in your shoes before you put them on to be sure there are no objects inside.  To break in new shoes, wear them for just a few hours a day. This prevents injuries on your feet. Wounds, scrapes, corns, and calluses  Check your feet daily for blisters, cuts, bruises, sores, and redness. If you cannot see the bottom of your feet, use a mirror or ask someone for help.  Do not cut corns or calluses or try to remove them with medicine.  If you  find a minor scrape, cut, or break in the skin on your feet, keep it and the skin around it clean and dry. You may clean these areas with mild soap and water. Do not clean the area with peroxide, alcohol, or iodine.  If you have a wound, scrape, corn, or callus on your foot, look at it several times a day to make sure it is healing and not infected. Check for: ? Redness, swelling, or pain. ? Fluid or blood. ? Warmth. ? Pus or a bad smell. General instructions  Do not cross your legs. This may decrease blood flow to your feet.  Do not use heating pads or hot water bottles on your feet. They may burn your skin. If you have lost feeling in your feet or legs, you may not know this is happening until it is too late.  Protect your feet from hot and cold by wearing shoes, such as at the beach or on hot pavement.  Schedule a complete foot exam at least once a year (annually) or more often if you have foot problems. If you have foot problems, report any cuts, sores, or bruises to your health care provider immediately. Contact a health care provider if:  You have a medical condition that increases your risk of infection and you have any cuts, sores, or bruises on your feet.  You have an injury that is not   healing.  You have redness on your legs or feet.  You feel burning or tingling in your legs or feet.  You have pain or cramps in your legs and feet.  Your legs or feet are numb.  Your feet always feel cold.  You have pain around a toenail. Get help right away if:  You have a wound, scrape, corn, or callus on your foot and: ? You have pain, swelling, or redness that gets worse. ? You have fluid or blood coming from the wound, scrape, corn, or callus. ? Your wound, scrape, corn, or callus feels warm to the touch. ? You have pus or a bad smell coming from the wound, scrape, corn, or callus. ? You have a fever. ? You have a red line going up your leg. Summary  Check your feet every day  for cuts, sores, red spots, swelling, and blisters.  Moisturize feet and legs daily.  Wear shoes that fit properly and have enough cushioning.  If you have foot problems, report any cuts, sores, or bruises to your health care provider immediately.  Schedule a complete foot exam at least once a year (annually) or more often if you have foot problems. This information is not intended to replace advice given to you by your health care provider. Make sure you discuss any questions you have with your health care provider. Document Released: 10/26/2000 Document Revised: 12/11/2017 Document Reviewed: 11/30/2016 Elsevier Patient Education  2020 [employer]   Onychomycosis/Fungal Toenails  WHAT IS IT? An infection that lies within the keratin of your nail plate that is caused by a fungus.  WHY ME? Fungal infections affect all ages, sexes, races, and creeds.  There may be many factors that predispose you to a fungal infection such as age, coexisting medical conditions such as diabetes, or an autoimmune disease; stress, medications, fatigue, genetics, etc.  Bottom line: fungus thrives in a warm, moist environment and your shoes offer such a location.  IS IT CONTAGIOUS? Theoretically, yes.  You do not want to share shoes, nail clippers or files with someone who has fungal toenails.  Walking around barefoot in the same room or sleeping in the same bed is unlikely to transfer the organism.  It is important to realize, however, that fungus can spread easily from one nail to the next on the same foot.  HOW DO WE TREAT THIS?  There are several ways to treat this condition.  Treatment may depend on many factors such as age, medications, pregnancy, liver and kidney conditions, etc.  It is best to ask your doctor which options are available to you.  1. No treatment.   Unlike many other medical concerns, you can live with this condition.  However for many people this can be a painful condition and may lead to  ingrown toenails or a bacterial infection.  It is recommended that you keep the nails cut short to help reduce the amount of fungal nail. 2. Topical treatment.  These range from herbal remedies to prescription strength nail lacquers.  About 40-50% effective, topicals require twice daily application for approximately 9 to 12 months or until an entirely new nail has grown out.  The most effective topicals are medical grade medications available through physicians offices. 3. Oral antifungal medications.  With an 80-90% cure rate, the most common oral medication requires 3 to 4 months of therapy and stays in your system for a year as the new nail grows out.  Oral antifungal medications do require   blood work to make sure it is a safe drug for you.  A liver function panel will be performed prior to starting the medication and after the first month of treatment.  It is important to have the blood work performed to avoid any harmful side effects.  In general, this medication safe but blood work is required. 4. Laser Therapy.  This treatment is performed by applying a specialized laser to the affected nail plate.  This therapy is noninvasive, fast, and non-painful.  It is not covered by insurance and is therefore, out of pocket.  The results have been very good with a 80-95% cure rate.  [facility] is the only practice in the area to offer this therapy. 5. Permanent Nail Avulsion.  Removing the entire nail so that a new nail will not grow back. 

## 2019-08-12 NOTE — Progress Notes (Signed)
Subjective: Paul [name] is seen today for follow up painful, elongated, thickened toenails 1-5 b/l feet that he cannot cut. Pain interferes with daily activities. Aggravating factor includes wearing enclosed shoe gear and relieved with periodic debridement.  He voices no new problems on today's visit.   Dr. Jonathon Jordan is his PCP. Last visit was 01/08/2019.  Current Outpatient Medications on File Prior to Visit  Medication Sig  . acetaminophen (TYLENOL) 500 MG tablet Take 1,000 mg by mouth daily.   Marland Kitchen albuterol (PROAIR HFA) 108 (90 BASE) MCG/ACT inhaler Inhale 2 puffs into the lungs every 2 (two) hours as needed for wheezing or shortness of breath.   Marland Kitchen amoxicillin-clavulanate (AUGMENTIN) 875-125 MG tablet Take 1 tablet by mouth 2 (two) times daily.  Marland Kitchen aspirin 81 MG tablet Take 81 mg by mouth daily.  Marland Kitchen atorvastatin (LIPITOR) 40 MG tablet TAKE 1 TABLET BY MOUTH AT  BEDTIME  . budesonide-formoterol (SYMBICORT) 160-4.5 MCG/ACT inhaler TAKE 2 PUFFS FIRST THING IN THE MORNING AND THEN  ANOTHER 2 PUFFS ABOUT 12  HOURS LATER.  . Budesonide-Formoterol Fumarate (SYMBICORT IN) Symbicort  . calcium carbonate (OS-CAL) 600 MG TABS tablet Take 600 mg by mouth daily with supper.   . calcium carbonate (OS-CAL) 600 MG TABS tablet Take by mouth.  . ergocalciferol (VITAMIN D2) 1.25 MG (50000 UT) capsule Vitamin D2 1,250 mcg (50,000 unit) capsule  . fexofenadine (ALLEGRA) 180 MG tablet Take 180 mg by mouth daily with breakfast.   . fluticasone (FLONASE) 50 MCG/ACT nasal spray Place 1 spray into the nose 2 (two) times daily.   . Fluticasone-Salmeterol (ADVAIR) 100-50 MCG/DOSE AEPB fluticasone 100 mcg-salmeterol 50 mcg/dose blistr powdr for inhalation  . furosemide (LASIX) 20 MG tablet TAKE 1 TABLET BY MOUTH  DAILY  . gabapentin (NEURONTIN) 300 MG capsule 4 at bedtime  . GABAPENTIN PO gabapentin  . glimepiride (AMARYL) 2 MG tablet Take 2 mg by mouth 2 (two) times daily.   Marland Kitchen guaiFENesin (MUCINEX) 600 MG 12  hr tablet Take 1,200 mg by mouth daily with breakfast.   . HYDROcodone-acetaminophen (NORCO) 10-325 MG tablet hydrocodone 10 mg-acetaminophen 325 mg tablet  . losartan (COZAAR) 50 MG tablet Take 1 tablet (50 mg total) by mouth daily. (Patient taking differently: Take 50 mg by mouth daily with breakfast. )  . metFORMIN (GLUCOPHAGE) 1000 MG tablet Take 1,000 mg by mouth 2 (two) times daily with a meal.    . metoCLOPramide (REGLAN) 10 MG tablet Take 5 mg by mouth 3 (three) times daily before meals.   . Multiple Vitamins-Minerals (CENTRUM SILVER 50+MEN) TABS Take by mouth.  . Multiple Vitamins-Minerals (CENTRUM SILVER ADULT 50+) TABS Take 1 tablet by mouth daily with breakfast.  . omeprazole (PRILOSEC) 20 MG capsule Take 20 mg by mouth daily.  . OXYGEN 4L of oxygen at night and 3L of oxygen when walking long distances  . SPIRIVA RESPIMAT 2.5 MCG/ACT AERS USE 2 SPRAYS (INHALATIONS)  EVERY MORNING  . Tiotropium Bromide Monohydrate (SPIRIVA RESPIMAT) 2.5 MCG/ACT AERS USE 2 PUFFS EVERY MORNING  . Tiotropium Bromide Monohydrate (SPIRIVA RESPIMAT) 2.5 MCG/ACT AERS Spiriva Respimat 2.5 mcg/actuation solution for inhalation  . Vitamin D, Ergocalciferol, (DRISDOL) 50000 UNITS CAPS capsule Take 50,000 Units by mouth every 14 (fourteen) days.    No current facility-administered medications on file prior to visit.      Allergies  Allergen Reactions  . Codeine Anaphylaxis  . Phenobarbital Hypertension     Objective:  Vascular Examination: Capillary refill time <3 seconds x 10  digits.  Dorsalis pedis present b/l.  Posterior tibial pulses present b/l.  Digital hair absent b/l.  Skin temperature gradient WNL b/l.   Dermatological Examination: Skin with normal turgor, texture and tone b/l.  Toenails 1-5 b/l discolored, thick, dystrophic with subungual debris and pain with palpation to nailbeds due to thickness of nails.  Incurvated nailplate b/l great toes with tenderness to palpation. No erythema,  no edema, no drainage noted.  Musculoskeletal: Muscle strength 5/5 to all LE muscle groups  No gross bony deformities b/l.  No pain, crepitus or joint limitation noted with ROM.   Neurological Examination: Protective sensation absent b/l with 10 gram monofilament.  Assessment: Painful onychomycosis toenails 1-5 b/l  NIDDM with neuropathy  Plan: 1. Toenails 1-5 b/l were debrided in length and girth without iatrogenic bleeding. Offending nail borders debrided and curretaged b/l great toes. Borders cleansed with alcohol. Antibiotic ointment applied. No further treatment required by patient. 2. Patient to continue soft, supportive shoe gear. 3. Patient to report any pedal injuries to medical professional immediately. 4. Follow up 3 months.  5. Patient/POA to call should there be a concern in the interim.

## 2019-10-19 ENCOUNTER — Ambulatory Visit: Payer: Medicare Other | Admitting: Podiatry

## 2019-11-16 ENCOUNTER — Ambulatory Visit: Payer: Medicare Other | Admitting: Podiatry

## 2019-12-09 ENCOUNTER — Ambulatory Visit: Payer: Medicare Other

## 2019-12-18 ENCOUNTER — Ambulatory Visit: Payer: Medicare Other | Attending: Internal Medicine

## 2019-12-18 DIAGNOSIS — Z23 Encounter for immunization: Secondary | ICD-10-CM

## 2019-12-18 NOTE — Progress Notes (Signed)
   Covid-19 Vaccination Clinic  Name:  Paul [name]    MRN: [id number] DOB: 04-20-1941  12/18/2019  [name] was observed post Covid-19 immunization for 15 minutes without incidence. He was provided with Vaccine Information Sheet and instruction to access the V-Safe system.   [name] was instructed to call 911 with any severe reactions post vaccine: Marland Kitchen Difficulty breathing  . Swelling of your face and throat  . A fast heartbeat  . A bad rash all over your body  . Dizziness and weakness    Immunizations Administered    Name Date Dose VIS Date Route   Pfizer COVID-19 Vaccine 12/18/2019  1:07 PM 0.3 mL 10/23/2019 Intramuscular   Manufacturer: [place]   Lot: [id number]   [place]: [id number]

## 2019-12-20 ENCOUNTER — Ambulatory Visit: Payer: Medicare Other

## 2020-01-12 ENCOUNTER — Ambulatory Visit: Payer: Medicare Other | Attending: Internal Medicine

## 2020-01-12 DIAGNOSIS — Z23 Encounter for immunization: Secondary | ICD-10-CM | POA: Insufficient documentation

## 2020-01-12 NOTE — Progress Notes (Signed)
   Covid-19 Vaccination Clinic  Name:  JAH ALARID    MRN: [id number] DOB: [birth year]  01/12/2020  Mr. Miron was observed post Covid-19 immunization for 15 minutes without incident. He was provided with Vaccine Information Sheet and instruction to access the V-Safe system.   Mr. Samples was instructed to call 911 with any severe reactions post vaccine: Marland Kitchen Difficulty breathing  . Swelling of face and throat  . A fast heartbeat  . A bad rash all over body  . Dizziness and weakness   Immunizations Administered    Name Date Dose VIS Date Route   Pfizer COVID-19 Vaccine 01/12/2020  1:14 PM 0.3 mL 10/23/2019 Intramuscular   Manufacturer: [place]   Lot: [id number]   [place]: [id number]

## 2020-01-14 DIAGNOSIS — E1143 Type 2 diabetes mellitus with diabetic autonomic (poly)neuropathy: Secondary | ICD-10-CM | POA: Diagnosis not present

## 2020-01-14 DIAGNOSIS — N1832 Chronic kidney disease, stage 3b: Secondary | ICD-10-CM | POA: Diagnosis not present

## 2020-01-14 DIAGNOSIS — E1342 Other specified diabetes mellitus with diabetic polyneuropathy: Secondary | ICD-10-CM | POA: Diagnosis not present

## 2020-01-14 DIAGNOSIS — E559 Vitamin D deficiency, unspecified: Secondary | ICD-10-CM | POA: Diagnosis not present

## 2020-01-14 DIAGNOSIS — F17200 Nicotine dependence, unspecified, uncomplicated: Secondary | ICD-10-CM | POA: Diagnosis not present

## 2020-01-14 DIAGNOSIS — I10 Essential (primary) hypertension: Secondary | ICD-10-CM | POA: Diagnosis not present

## 2020-01-14 DIAGNOSIS — E7800 Pure hypercholesterolemia, unspecified: Secondary | ICD-10-CM | POA: Diagnosis not present

## 2020-02-15 DIAGNOSIS — E119 Type 2 diabetes mellitus without complications: Secondary | ICD-10-CM | POA: Diagnosis not present

## 2020-02-23 NOTE — Progress Notes (Signed)
Cardiology Clinic Note   Patient Name: ASANI MCBURNEY Date of Encounter: 02/25/2020  Primary Care Provider:  Jonathon Jordan, MD Primary Cardiologist:  Kirk Ruths, MD  Patient Profile    Jeanine Luz. Butchko 79 year old male presents today for follow-up evaluation of his PAF, CAD, and hypertension.  Past Medical History    Past Medical History:  Diagnosis Date  . Asthma   . Atrial fibrillation ([place])   . CAD (coronary artery disease)    Prior PTCA  . COPD (chronic obstructive pulmonary disease) ([place])   . Diabetes mellitus   . GERD (gastroesophageal reflux disease)   . Hyperlipidemia   . Hypertension   . lung ca dx'd 01/2006  . Nephrolithiasis   . Osteopenia   . Pulmonary embolus ([place])   . Tibia/fibula fracture 07/2016   Left  . Tremor, essential    Past Surgical History:  Procedure Laterality Date  . Angioplasty  Approx 1993  . CARDIAC CATHETERIZATION  Approximately 1993  . CHOLECYSTECTOMY    . 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 FIXATION KYPHOPLASTY    . LUNG CANCER SURGERY  01/2006   Small excision of left lung tissue; mesh with radioactive seeds (per pt report)  . ORIF TIBIA FRACTURE Left 08/22/2016   Procedure: OPEN REDUCTION INTERNAL FIXATION (ORIF) TIBIA FRACTURE;  Surgeon: Nicholes Stairs, MD;  Location: [facility];  Service: Orthopedics;  Laterality: Left;  . ROTATOR CUFF TEAR  2016  . Tibia/Fibula Repair Left 07/2016  . TONSILLECTOMY      Allergies  Allergies  Allergen Reactions  . Codeine Anaphylaxis  . Phenobarbital Hypertension    History of Present Illness    Mr. Panepinto has a past medical history of coronary artery disease and atrial fibrillation.  His first PCI was to his LAD in 1988.  He has been previously admitted for pulmonary embolus.  During his hospitalization he developed atrial fibrillation with RVR.  His echocardiogram 5/13 showed normal LV function and mild left ventricular hypertrophy.  His nuclear stress test 3/17 showed  an EF of 65%, attenuation artifact and no ischemia.  He was last seen by Dr. Stanford Breed on 02/04/2017.  During that time he was experiencing some dyspnea with exertion that was unchanged.  He had no orthopnea or PND.  And he denied chest pain and syncope.  He presents the clinic today for follow-up evaluation and states he feels well.  He continues to see Dr. Melvyn Novas for his COPD. He has no complaints of increased shortness of breath, fatigue, palpitations, or increased activity intolerance.  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  I have discussed the option of cardioversion and risk of CVA.  I will place him on Eliquis 5 mg twice daily at this time and have him follow-up in 1 month.  All questions were answered he expressed understanding.  Today he denies chest pain, shortness of breath, lower extremity edema, fatigue, palpitations, melena, hematuria, hemoptysis, diaphoresis, weakness, presyncope, syncope, orthopnea, and PND.   Home Medications    Prior to Admission medications   Medication Sig Start Date End Date Taking? Authorizing Provider  acetaminophen (TYLENOL) 500 MG tablet Take 1,000 mg by mouth daily.     [provider]  albuterol (PROAIR HFA) 108 (90 BASE) MCG/ACT inhaler Inhale 2 puffs into the lungs every 2 (two) hours as needed for wheezing or shortness of breath.     [provider]  amoxicillin-clavulanate (  AUGMENTIN) 875-125 MG tablet Take 1 tablet by mouth 2 (two) times daily. 07/09/18   Tanda Rockers, MD  aspirin 81 MG tablet Take 81 mg by mouth daily.    [provider]  atorvastatin (LIPITOR) 40 MG tablet TAKE 1 TABLET BY MOUTH AT  BEDTIME 10/06/18   Crenshaw, Denice Bors, MD  budesonide-formoterol (SYMBICORT) 160-4.5 MCG/ACT inhaler TAKE 2 PUFFS FIRST THING IN THE MORNING AND THEN  ANOTHER 2 PUFFS ABOUT 12  HOURS LATER. 07/28/18   Tanda Rockers, MD    Budesonide-Formoterol Fumarate (SYMBICORT IN) Symbicort    [provider]  calcium carbonate (OS-CAL) 600 MG TABS tablet Take 600 mg by mouth daily with supper.     [provider]  calcium carbonate (OS-CAL) 600 MG TABS tablet Take by mouth.    [provider]  ergocalciferol (VITAMIN D2) 1.25 MG (50000 UT) capsule Vitamin D2 1,250 mcg (50,000 unit) capsule    [provider]  fexofenadine (ALLEGRA) 180 MG tablet Take 180 mg by mouth daily with breakfast.     [provider]  fluticasone (FLONASE) 50 MCG/ACT nasal spray Place 1 spray into the nose 2 (two) times daily.     [provider]  Fluticasone-Salmeterol (ADVAIR) 100-50 MCG/DOSE AEPB fluticasone 100 mcg-salmeterol 50 mcg/dose blistr powdr for inhalation    [provider]  furosemide (LASIX) 20 MG tablet TAKE 1 TABLET BY MOUTH  DAILY 12/05/18   Tanda Rockers, MD  gabapentin (NEURONTIN) 300 MG capsule 4 at bedtime    [provider]  GABAPENTIN PO gabapentin    [provider]  glimepiride (AMARYL) 2 MG tablet Take 2 mg by mouth 2 (two) times daily.  04/28/16   [provider]  guaiFENesin (MUCINEX) 600 MG 12 hr tablet Take 1,200 mg by mouth daily with breakfast.     [provider]  HYDROcodone-acetaminophen (NORCO) 10-325 MG tablet hydrocodone 10 mg-acetaminophen 325 mg tablet    [provider]  losartan (COZAAR) 50 MG tablet Take 1 tablet (50 mg total) by mouth daily. Patient taking differently: Take 50 mg by mouth daily with breakfast.  10/21/13   Tanda Rockers, MD  metFORMIN (GLUCOPHAGE) 1000 MG tablet Take 1,000 mg by mouth 2 (two) times daily with a meal.      [provider]  metoCLOPramide (REGLAN) 10 MG tablet Take 5 mg by mouth 3 (three) times daily before meals.     [provider]  Multiple Vitamins-Minerals (CENTRUM SILVER 50+MEN) TABS Take by mouth.    [provider]  Multiple  Vitamins-Minerals (CENTRUM SILVER ADULT 50+) TABS Take 1 tablet by mouth daily with breakfast.    [provider]  omeprazole (PRILOSEC) 20 MG capsule Take 20 mg by mouth daily.    [provider]  OXYGEN 4L of oxygen at night and 3L of oxygen when walking long distances    [provider]  SPIRIVA RESPIMAT 2.5 MCG/ACT AERS USE 2 SPRAYS (INHALATIONS)  EVERY MORNING 07/28/18   Tanda Rockers, MD  Tiotropium Bromide Monohydrate (SPIRIVA RESPIMAT) 2.5 MCG/ACT AERS USE 2 PUFFS EVERY MORNING 06/03/17   Tanda Rockers, MD  Tiotropium Bromide Monohydrate (SPIRIVA RESPIMAT) 2.5 MCG/ACT AERS Spiriva Respimat 2.5 mcg/actuation solution for inhalation    [provider]  Vitamin D, Ergocalciferol, (DRISDOL) 50000 UNITS CAPS capsule Take 50,000 Units by mouth every 14 (fourteen) days.     [provider]    Family History    Family History  Problem Relation Age of Onset  . Heart disease Mother   . Tremor Mother   . Dementia Mother   . Heart Problems Father    He indicated that his mother is deceased. He indicated that his father is deceased. He indicated that his maternal grandmother is deceased. He indicated that his maternal grandfather is deceased. He indicated that his paternal grandmother is deceased. He indicated that his paternal grandfather is deceased.  Social History    Social History   Socioeconomic History  . Marital status: Married    Spouse name: Santiago Glad  . Number of children: 2  . Years of education: college  . Highest education level: Not on file  Occupational History    Employer: RETIRED    Comment: Retired from [employer]  Tobacco Use  . Smoking status: Current Every Day Smoker    Packs/day: 0.50    Years: 59.00    Pack years: 29.50    Types: Cigarettes  . Smokeless tobacco: Never Used  Substance and Sexual Activity  . Alcohol use: Yes    Alcohol/week: 1.0 standard drinks    Types: 1 Glasses of wine per week    Comment: Rare    . Drug use: No  . Sexual activity: Never  Other Topics Concern  . Not on file  Social History Narrative   Patient lives at home with his wife Santiago Glad).   Retired.   Education- College    Right handed.   Caffeine- three cups of coffee daily.   Social Determinants of Health   Financial Resource Strain:   . Difficulty of Paying Living Expenses:   Food Insecurity:   . Worried About Charity fundraiser in the Last Year:   . Arboriculturist in the Last Year:   Transportation Needs:   . Film/video editor (Medical):   Marland Kitchen Lack of Transportation (Non-Medical):   Physical Activity:   . Days of Exercise per Week:   . Minutes of Exercise per Session:   Stress:   . Feeling of Stress :   Social Connections:   . Frequency of Communication with Friends and Family:   . Frequency of Social Gatherings with Friends and Family:   . Attends Religious Services:   . Active Member of Clubs or Organizations:   . Attends Archivist Meetings:   Marland Kitchen Marital Status:   Intimate Partner Violence:   . Fear of Current or Ex-Partner:   . Emotionally Abused:   Marland Kitchen Physically Abused:   . Sexually Abused:      Review of Systems    General:  No chills, fever, night sweats or weight changes.  Cardiovascular:  No chest pain, dyspnea on exertion, edema, orthopnea, palpitations, paroxysmal nocturnal dyspnea. Dermatological: No rash, lesions/masses Respiratory: No cough, dyspnea Urologic: No hematuria, dysuria Abdominal:   No nausea, vomiting, diarrhea, bright red blood per rectum, melena, or hematemesis Neurologic:  No visual changes, wkns, changes in mental status. All other systems reviewed and are otherwise negative except as noted above.  Physical Exam    VS:  BP 116/62 (BP Location: Left Arm, Patient Position: Sitting, Cuff Size: Normal)   Temp (!) 97.5 F (36.4 C)   Ht _0  (1.702 m)   Wt 229 lb (103.9 kg)   BMI 35.87 kg/m  , BMI Body mass index is 35.87 kg/m. GEN: Well nourished,  well developed, in no acute distress. HEENT: normal. Neck: Supple, no JVD, carotid bruits, or masses. Cardiac: RRR, no  murmurs, rubs, or gallops. No clubbing, cyanosis, edema.  Radials/DP/PT 2+ and equal bilaterally.  Respiratory:  Respirations regular and unlabored, clear to auscultation bilaterally. GI: Soft, nontender, nondistended, BS + x 4. MS: no deformity or atrophy. Skin: warm and dry, no rash. Neuro:  Strength and sensation are intact. Psych: Normal affect.  Accessory Clinical Findings    ECG personally reviewed by me today-atrial fibrillation with rapid ventricular response right bundle branch block left anterior fascicular block 105 bpm  EKG 02/04/2017 Sinus rhythm with premature supraventricular complexes right bundle branch block left fascicular block 80 bpm  Echocardiogram 03/26/2012 Study Conclusions   - Left ventricle: Technically limited images. The cavity  size was normal. Wall thickness was increased in a pattern  of mild LVH. The estimated ejection fraction was 60%.  Regional wall motion abnormalities cannot be excluded.  - Left atrium: The atrium was mildly dilated.  - Right ventricle: Poorly visualized. Overall RV function  probably OK. RV pressure can not be estimated.  Transthoracic echocardiography. M-mode, complete 2D,  spectral Doppler, and color Doppler. Height: Height:  172.7cm. Height: 68in. Weight: Weight: 110.2kg. Weight:  242.5lb. Body mass index: BMI: 36.9kg/m^2. Body surface  area:  BSA: 2.93m2. Blood pressure:   145/84. Patient  status: Inpatient. Location: Bedside.    Assessment & Plan   1.Paroxysmal atrial fibrillation-history of paroxysmal atrial fibrillation in the setting of pulmonary embolus.  EKG today shows atrial fibrillation with RVR right bundle branch block left ventricular anterior block 105 bpm.  Discussed DCCV.  Patient wishes to defer DCCV procedure and be placed on DOAC. Start Eliquis 5 mg twice  daily Avoid triggers caffeine, chocolate, EtOH etc. Order CBC in 1 month    Coronary artery disease-no chest pain today. Stop aspirin Continue statin Heart healthy low-sodium diet Increase physical activity as tolerated  Hyperlipidemia-LDL 55 on 07/09/2019 Continue atorvastatin 40 mg daily Heart healthy low-sodium high-fiber diet Increase physical activity as tolerated  Essential hypertension-BP today 116/62 Continue losartan 50 mg tablet daily Heart healthy low-sodium diet-salty 6 given Increase physical activity as tolerated   Disposition: Follow-up with me in 1 month.  JJossie Ng [facility] [address] Office [phone] [phone]

## 2020-02-25 ENCOUNTER — Ambulatory Visit (INDEPENDENT_AMBULATORY_CARE_PROVIDER_SITE_OTHER): Payer: Medicare Other | Admitting: General Practice

## 2020-02-25 ENCOUNTER — Encounter: Payer: Self-pay | Admitting: General Practice

## 2020-02-25 ENCOUNTER — Other Ambulatory Visit: Payer: Self-pay

## 2020-02-25 VITALS — BP 116/62 | Temp 97.5°F | Ht 67.0 in | Wt 229.0 lb

## 2020-02-25 DIAGNOSIS — Z79899 Other long term (current) drug therapy: Secondary | ICD-10-CM

## 2020-02-25 DIAGNOSIS — I10 Essential (primary) hypertension: Secondary | ICD-10-CM

## 2020-02-25 DIAGNOSIS — E7800 Pure hypercholesterolemia, unspecified: Secondary | ICD-10-CM | POA: Diagnosis not present

## 2020-02-25 DIAGNOSIS — I2510 Atherosclerotic heart disease of native coronary artery without angina pectoris: Secondary | ICD-10-CM

## 2020-02-25 DIAGNOSIS — I480 Paroxysmal atrial fibrillation: Secondary | ICD-10-CM

## 2020-02-25 MED ORDER — APIXABAN 5 MG PO TABS: 5.0000 mg | ORAL_TABLET | Freq: Two times a day (BID) | ORAL | 3 refills | Status: DC

## 2020-02-25 NOTE — Patient Instructions (Addendum)
Medication Instructions:  STOP ASPIRIN  START ELIQUIS 5MG TWICE DAILY *If you need a refill on your cardiac medications before your next appointment, please call your pharmacy*  Lab Work: CBC 3 DAYS BEFORE FOLLOW UP APPOINTMENT HERE IN OUR OFFICE. If you have labs (blood work) drawn today and your tests are completely normal, you will receive your results only by:  [place] (if you have MyChart) OR A paper copy in the mail.  If you have any lab test that is abnormal or we need to change your treatment, we will call you to review the results. You may go to any [facility] that is convenient for you however, we do have a lab in our office that is able to assist you. You DO NOT need an appointment for our lab. The lab is open 8:00am and closes at 4:00pm. Lunch 12:45 - 1:45pm.  Special Instructions Please try to avoid these triggers:  Do not use any products that have nicotine or tobacco in them. These include cigarettes, e-cigarettes, and chewing tobacco. If you need help quitting, ask your doctor.  Eat heart-healthy foods. Talk with your doctor about the right eating plan for you.  Exercise regularly as told by your doctor.  Do not drink alcohol, Caffeine or chocolate.  Lose weight if you are overweight.  Do not use drugs, including cannabis   PLEASE READ AND FOLLOW SALTY 6-ATTACHED  Follow-Up: Your next appointment:  1 month(s) In Person with Coletta Memos, FNP  At [facility], you and your health needs are our priority.  As part of our continuing mission to provide you with exceptional heart care, we have created designated Provider Care Teams.  These Care Teams include your primary Cardiologist (physician) and Advanced Practice Providers (APPs -  Physician Assistants and Nurse Practitioners) who all work together to provide you with the care you need, when you need it.  We recommend signing up for the patient portal called "MyChart".  Sign up information is provided on this  After Visit Summary.  MyChart is used to connect with patients for Virtual Visits (Telemedicine).  Patients are able to view lab/test results, encounter notes, upcoming appointments, etc.  Non-urgent messages can be sent to your provider as well.   To learn more about what you can do with MyChart, go to [email or web address].

## 2020-02-26 ENCOUNTER — Telehealth: Payer: Self-pay | Admitting: General Practice

## 2020-02-26 NOTE — Telephone Encounter (Signed)
New message   Patient has questions about his ekg that was done on yesterday. Please call.

## 2020-02-26 NOTE — Telephone Encounter (Signed)
Contacted patient- advised of message.  Patient and wife verbalized understanding.

## 2020-02-26 NOTE — Telephone Encounter (Signed)
Contacted patient- he states that he was seen yesterday by Denyse Amass and during the EKG he states they tried multiple times to get a clear EKG- and finally after about 7 times they got him up on the bed, and by this time he was irritated- he also states that he had on lotion, and his friend that is a Buyer, retail said that this could effect the reading on an EKG, and they are placing him on medication due to this EKG reading that read AFIB- he states he would like to know if another EKG should be completed, also he is concerned with the side effects of the [facility], he states he has other health issues and concerns and is scared to take this.   I advised I would be glad to send a message over to provider who he saw as well as Dr.Crenshaw to get his opinion- on if another EKG would be a good idea, and if the ELIQUIS is a good option as he is nervous and does not want to take it.   Patient verbalized understanding.

## 2020-02-26 NOTE — Telephone Encounter (Signed)
Patient's electrocardiogram is not in the chart so I do not have access.  If he has atrial fibrillation I agree with apixaban. Paul [name]

## 2020-03-23 ENCOUNTER — Telehealth: Payer: Self-pay | Admitting: General Practice

## 2020-03-23 NOTE — Telephone Encounter (Signed)
Wife of the patient called. The patient has some memory issues and would like his wife to be able to come with him to his upcoming appointment. Please let the wife  know what the office decides

## 2020-03-23 NOTE — Telephone Encounter (Signed)
Spoke with patient's spouse per DPR. Okay for spouse to accompany patient to appointment due to memory issues. Spouse verbalized understanding.

## 2020-03-24 ENCOUNTER — Other Ambulatory Visit: Payer: Self-pay

## 2020-03-24 ENCOUNTER — Encounter: Payer: Self-pay | Admitting: Podiatry

## 2020-03-24 ENCOUNTER — Ambulatory Visit (INDEPENDENT_AMBULATORY_CARE_PROVIDER_SITE_OTHER): Payer: Medicare Other | Admitting: Podiatry

## 2020-03-24 DIAGNOSIS — M2142 Flat foot [pes planus] (acquired), left foot: Secondary | ICD-10-CM | POA: Diagnosis not present

## 2020-03-24 DIAGNOSIS — B351 Tinea unguium: Secondary | ICD-10-CM

## 2020-03-24 DIAGNOSIS — E1142 Type 2 diabetes mellitus with diabetic polyneuropathy: Secondary | ICD-10-CM | POA: Diagnosis not present

## 2020-03-24 DIAGNOSIS — L84 Corns and callosities: Secondary | ICD-10-CM

## 2020-03-24 DIAGNOSIS — E119 Type 2 diabetes mellitus without complications: Secondary | ICD-10-CM

## 2020-03-24 DIAGNOSIS — M2141 Flat foot [pes planus] (acquired), right foot: Secondary | ICD-10-CM

## 2020-03-24 DIAGNOSIS — M79675 Pain in left toe(s): Secondary | ICD-10-CM

## 2020-03-24 NOTE — Patient Instructions (Signed)
Diabetes Mellitus and Foot Care Foot care is an important part of your health, especially when you have diabetes. Diabetes may cause you to have problems because of poor blood flow (circulation) to your feet and legs, which can cause your skin to:  Become thinner and drier.  Break more easily.  Heal more slowly.  Peel and crack. You may also have nerve damage (neuropathy) in your legs and feet, causing decreased feeling in them. This means that you may not notice minor injuries to your feet that could lead to more serious problems. Noticing and addressing any potential problems early is the best way to prevent future foot problems. How to care for your feet Foot hygiene  Wash your feet daily with warm water and mild soap. Do not use hot water. Then, pat your feet and the areas between your toes until they are completely dry. Do not soak your feet as this can dry your skin.  Trim your toenails straight across. Do not dig under them or around the cuticle. File the edges of your nails with an emery board or nail file.  Apply a moisturizing lotion or petroleum jelly to the skin on your feet and to dry, brittle toenails. Use lotion that does not contain alcohol and is unscented. Do not apply lotion between your toes. Shoes and socks  Wear clean socks or stockings every day. Make sure they are not too tight. Do not wear knee-high stockings since they may decrease blood flow to your legs.  Wear shoes that fit properly and have enough cushioning. Always look in your shoes before you put them on to be sure there are no objects inside.  To break in new shoes, wear them for just a few hours a day. This prevents injuries on your feet. Wounds, scrapes, corns, and calluses  Check your feet daily for blisters, cuts, bruises, sores, and redness. If you cannot see the bottom of your feet, use a mirror or ask someone for help.  Do not cut corns or calluses or try to remove them with medicine.  If you  find a minor scrape, cut, or break in the skin on your feet, keep it and the skin around it clean and dry. You may clean these areas with mild soap and water. Do not clean the area with peroxide, alcohol, or iodine.  If you have a wound, scrape, corn, or callus on your foot, look at it several times a day to make sure it is healing and not infected. Check for: ? Redness, swelling, or pain. ? Fluid or blood. ? Warmth. ? Pus or a bad smell. General instructions  Do not cross your legs. This may decrease blood flow to your feet.  Do not use heating pads or hot water bottles on your feet. They may burn your skin. If you have lost feeling in your feet or legs, you may not know this is happening until it is too late.  Protect your feet from hot and cold by wearing shoes, such as at the beach or on hot pavement.  Schedule a complete foot exam at least once a year (annually) or more often if you have foot problems. If you have foot problems, report any cuts, sores, or bruises to your health care provider immediately. Contact a health care provider if:  You have a medical condition that increases your risk of infection and you have any cuts, sores, or bruises on your feet.  You have an injury that is not   healing.  You have redness on your legs or feet.  You feel burning or tingling in your legs or feet.  You have pain or cramps in your legs and feet.  Your legs or feet are numb.  Your feet always feel cold.  You have pain around a toenail. Get help right away if:  You have a wound, scrape, corn, or callus on your foot and: ? You have pain, swelling, or redness that gets worse. ? You have fluid or blood coming from the wound, scrape, corn, or callus. ? Your wound, scrape, corn, or callus feels warm to the touch. ? You have pus or a bad smell coming from the wound, scrape, corn, or callus. ? You have a fever. ? You have a red line going up your leg. Summary  Check your feet every day  for cuts, sores, red spots, swelling, and blisters.  Moisturize feet and legs daily.  Wear shoes that fit properly and have enough cushioning.  If you have foot problems, report any cuts, sores, or bruises to your health care provider immediately.  Schedule a complete foot exam at least once a year (annually) or more often if you have foot problems. This information is not intended to replace advice given to you by your health care provider. Make sure you discuss any questions you have with your health care provider. Document Revised: 07/22/2019 Document Reviewed: 11/30/2016 Elsevier Patient Education  2020 [employer]  

## 2020-03-25 DIAGNOSIS — Z79899 Other long term (current) drug therapy: Secondary | ICD-10-CM | POA: Diagnosis not present

## 2020-03-25 DIAGNOSIS — I480 Paroxysmal atrial fibrillation: Secondary | ICD-10-CM | POA: Diagnosis not present

## 2020-03-25 LAB — CBC
Hematocrit: 43.4 % (ref 37.5–51.0)
Hemoglobin: 13.6 g/dL (ref 13.0–17.7)
MCH: 26.4 pg — ABNORMAL LOW (ref 26.6–33.0)
MCHC: 31.3 g/dL — ABNORMAL LOW (ref 31.5–35.7)
MCV: 84 fL (ref 79–97)
Platelets: 288 10*3/uL (ref 150–450)
RBC: 5.15 x10E6/uL (ref 4.14–5.80)
RDW: 14.4 % (ref 11.6–15.4)
WBC: 8.7 10*3/uL (ref 3.4–10.8)

## 2020-03-27 NOTE — Progress Notes (Signed)
Cardiology Clinic Note   Patient Name: Paul [name] Date of Encounter: 03/28/2020  Primary Care Provider:  Jonathon Jordan, MD Primary Cardiologist:  Kirk Ruths, MD  Patient Profile    Paul Luz. [name] 79 year old male presents today for follow-up evaluation of his PAF, CAD, and hypertension.  Past Medical History    Past Medical History:  Diagnosis Date  . Asthma   . Atrial fibrillation ([place])   . CAD (coronary artery disease)    Prior PTCA  . COPD (chronic obstructive pulmonary disease) ([place])   . Diabetes mellitus   . GERD (gastroesophageal reflux disease)   . Hyperlipidemia   . Hypertension   . lung ca dx'd 01/2006  . Nephrolithiasis   . Osteopenia   . Pulmonary embolus ([place])   . Tibia/fibula fracture 07/2016   Left  . Tremor, essential    Past Surgical History:  Procedure Laterality Date  . Angioplasty  Approx 1993  . CARDIAC CATHETERIZATION  Approximately 1993  . CHOLECYSTECTOMY    . 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 FIXATION KYPHOPLASTY    . LUNG CANCER SURGERY  01/2006   Small excision of left lung tissue; mesh with radioactive seeds (per pt report)  . ORIF TIBIA FRACTURE Left 08/22/2016   Procedure: OPEN REDUCTION INTERNAL FIXATION (ORIF) TIBIA FRACTURE;  Surgeon: Nicholes Stairs, MD;  Location: [facility];  Service: Orthopedics;  Laterality: Left;  . ROTATOR CUFF TEAR  2016  . Tibia/Fibula Repair Left 07/2016  . TONSILLECTOMY      Allergies  Allergies  Allergen Reactions  . Codeine Anaphylaxis  . Phenobarbital Hypertension    History of Present Illness    Paul [name] has a past medical history of coronary artery disease and atrial fibrillation.  His first PCI was to his LAD in 1988.  He has been previously admitted for pulmonary embolus.  During his hospitalization he developed atrial fibrillation with RVR.  His echocardiogram 5/13 showed normal LV function and mild left ventricular hypertrophy.  His nuclear stress test 3/17 showed  an EF of 65%, attenuation artifact and no ischemia.  He was last seen by Dr. Stanford Breed on 02/04/2017.  During that time he was experiencing some dyspnea with exertion that was unchanged.  He had no orthopnea or PND.  And he denied chest pain and syncope.  He presented to the clinic 02/25/20 for follow-up evaluation and stated he felt well.  He continued to see Dr. Melvyn Novas for his COPD. He had no complaints of increased shortness of breath, fatigue, palpitations, or increased activity intolerance.  His EKG  showed atrial fibrillation with rapid ventricular response right bundle branch block left anterior fascicular block.  I  discussed the option of cardioversion and risk of CVA.  I  placed him on Eliquis 5 mg twice daily at that time and planned follow-up for 1 month.  All questions were answered he expressed understanding.  He presents the clinic today for follow-up evaluation and states he has noticed he has a runny nose, cough, intermittent diarrhea, and some shortness of breath.  He states that he has noticed the symptoms since starting the Eliquis 1 month ago.  I reassured him that these are not side effects of his new medication.  He has not had any signs of bleeding and his CBC shows slightly decreased MCH and MCHC.  This was discussed and explained.  His MCH and MCHC are typically borderline low normal.  I will continue his Eliquis and give him a  prescription to [facility].  We discussed him using his oxygen while he ambulates to the bathroom.  He states that when he notices his chest pressure that is when he walks to the bathroom about his oxygen.  I will have him follow-up with Dr. Stanford Breed in 3 months, give him instructions to increase the iron in his diet, and have him increase his physical activity as tolerated.  Today he denies chest pain, shortness of breath, lower extremity edema, fatigue, palpitations, melena, hematuria, hemoptysis, diaphoresis, weakness, presyncope, syncope, orthopnea, and PND.   Home Medications    Prior to Admission medications   Medication Sig Start Date End Date Taking? Authorizing Provider  acetaminophen (TYLENOL) 500 MG tablet Take 1,000 mg by mouth daily.     [provider]  albuterol (PROAIR HFA) 108 (90 BASE) MCG/ACT inhaler Inhale 2 puffs into the lungs every 2 (two) hours as needed for wheezing or shortness of breath.     [provider]  amoxicillin-clavulanate (AUGMENTIN) 875-125 MG tablet Take 1 tablet by mouth 2 (two) times daily. 07/09/18   Tanda Rockers, MD  apixaban (ELIQUIS) 5 MG TABS tablet Take 1 tablet (5 mg total) by mouth 2 (two) times daily. 02/25/20   Deberah Pelton, NP  atorvastatin (LIPITOR) 40 MG tablet TAKE 1 TABLET BY MOUTH AT  BEDTIME 10/06/18   Lelon Perla, MD  budesonide-formoterol (SYMBICORT) 160-4.5 MCG/ACT inhaler TAKE 2 PUFFS FIRST THING IN THE MORNING AND THEN  ANOTHER 2 PUFFS ABOUT 12  HOURS LATER. 07/28/18   Tanda Rockers, MD  Budesonide-Formoterol Fumarate (SYMBICORT IN) Symbicort    [provider]  calcium carbonate (OS-CAL) 600 MG TABS tablet Take 600 mg by mouth daily with supper.     [provider]  calcium carbonate (OS-CAL) 600 MG TABS tablet Take by mouth.    [provider]  ergocalciferol (VITAMIN D2) 1.25 MG (50000 UT) capsule Vitamin D2 1,250 mcg (50,000 unit) capsule    [provider]  fexofenadine (ALLEGRA) 180 MG tablet Take 180 mg by mouth daily with breakfast.     [provider]  fluticasone (FLONASE) 50 MCG/ACT nasal spray Place 1 spray into the nose 2 (two) times daily.     [provider]  Fluticasone-Salmeterol (ADVAIR) 100-50 MCG/DOSE AEPB fluticasone 100 mcg-salmeterol 50 mcg/dose blistr powdr for inhalation    [provider]  furosemide (LASIX) 20 MG tablet TAKE 1 TABLET BY MOUTH  DAILY 12/05/18   Tanda Rockers, MD  gabapentin (NEURONTIN) 300 MG capsule 4 at bedtime    [provider]  GABAPENTIN PO  gabapentin    [provider]  glimepiride (AMARYL) 2 MG tablet Take 2 mg by mouth 2 (two) times daily.  04/28/16   [provider]  guaiFENesin (MUCINEX) 600 MG 12 hr tablet Take 1,200 mg by mouth daily with breakfast.     [provider]  HYDROcodone-acetaminophen (NORCO) 10-325 MG tablet hydrocodone 10 mg-acetaminophen 325 mg tablet    [provider]  losartan (COZAAR) 50 MG tablet Take 1 tablet (50 mg total) by mouth daily. Patient taking differently: Take 50 mg by mouth daily with breakfast.  10/21/13   Tanda Rockers, MD  metFORMIN (GLUCOPHAGE) 1000 MG tablet Take 1,000 mg by mouth 2 (two) times daily with a meal.      [provider]  metoCLOPramide (REGLAN) 10 MG tablet Take 5 mg by mouth 3 (three) times daily before meals.     [provider]  Multiple Vitamins-Minerals (CENTRUM SILVER 50+MEN) TABS Take by mouth.    [provider]  Multiple Vitamins-Minerals (CENTRUM SILVER ADULT 50+) TABS Take 1 tablet by mouth daily with breakfast.    [provider]  omeprazole (PRILOSEC) 20 MG capsule Take 20 mg by mouth daily.    [provider]  OXYGEN 4L of oxygen at night and 3L of oxygen when walking long distances    [provider]  SPIRIVA RESPIMAT 2.5 MCG/ACT AERS USE 2 SPRAYS (INHALATIONS)  EVERY MORNING 07/28/18   Tanda Rockers, MD  Tiotropium Bromide Monohydrate (SPIRIVA RESPIMAT) 2.5 MCG/ACT AERS USE 2 PUFFS EVERY MORNING 06/03/17   Tanda Rockers, MD  Tiotropium Bromide Monohydrate (SPIRIVA RESPIMAT) 2.5 MCG/ACT AERS Spiriva Respimat 2.5 mcg/actuation solution for inhalation    [provider]  Vitamin D, Ergocalciferol, (DRISDOL) 50000 UNITS CAPS capsule Take 50,000 Units by mouth every 14 (fourteen) days.     [provider]    Family History    Family History  Problem Relation Age of Onset  . Heart disease Mother   . Tremor Mother   . Dementia Mother   . Heart Problems  Father    He indicated that his mother is deceased. He indicated that his father is deceased. He indicated that his maternal grandmother is deceased. He indicated that his maternal grandfather is deceased. He indicated that his paternal grandmother is deceased. He indicated that his paternal grandfather is deceased.  Social History    Social History   Socioeconomic History  . Marital status: Married    Spouse name: Santiago Glad  . Number of children: 2  . Years of education: college  . Highest education level: Not on file  Occupational History    Employer: RETIRED    Comment: Retired from [employer]  Tobacco Use  . Smoking status: Current Every Day Smoker    Packs/day: 0.50    Years: 59.00    Pack years: 29.50    Types: Cigarettes  . Smokeless tobacco: Never Used  Substance and Sexual Activity  . Alcohol use: Yes    Alcohol/week: 1.0 standard drinks    Types: 1 Glasses of wine per week    Comment: Rare  . Drug use: No  . Sexual activity: Never  Other Topics Concern  . Not on file  Social History Narrative   Patient lives at home with his wife Santiago Glad).   Retired.   Education- College    Right handed.   Caffeine- three cups of coffee daily.   Social Determinants of Health   Financial Resource Strain:   . Difficulty of Paying Living Expenses:   Food Insecurity:   . Worried About Charity fundraiser in the Last Year:   . Arboriculturist in the Last Year:   Transportation Needs:   . Film/video editor (Medical):   Marland Kitchen Lack of Transportation (Non-Medical):   Physical Activity:   . Days of Exercise per Week:   . Minutes of Exercise per Session:   Stress:   . Feeling of Stress :   Social Connections:   . Frequency of Communication with Friends and Family:   . Frequency of Social Gatherings with Friends and Family:   . Attends Religious Services:   . Active Member of Clubs or Organizations:   . Attends Archivist Meetings:   Marland Kitchen Marital Status:   Intimate Partner  Violence:   . Fear of Current or Ex-Partner:   .  Emotionally Abused:   Marland Kitchen Physically Abused:   . Sexually Abused:      Review of Systems    General:  No chills, fever, night sweats or weight changes.  Cardiovascular:  No chest pain, dyspnea on exertion, edema, orthopnea, palpitations, paroxysmal nocturnal dyspnea. Dermatological: No rash, lesions/masses Respiratory: No cough, dyspnea Urologic: No hematuria, dysuria Abdominal:   No nausea, vomiting, diarrhea, bright red blood per rectum, melena, or hematemesis Neurologic:  No visual changes, wkns, changes in mental status. All other systems reviewed and are otherwise negative except as noted above.  Physical Exam    VS:  BP 130/66 (BP Location: Left Arm, Patient Position: Sitting, Cuff Size: Normal)   Pulse 75   Temp 97.9 F (36.6 C) (Tympanic)   Wt 223 lb 3.2 oz (101.2 kg)   SpO2 93%   BMI 34.96 kg/m  , BMI Body mass index is 34.96 kg/m. GEN: Well nourished, well developed, in no acute distress. HEENT: normal. Neck: Supple, no JVD, carotid bruits, or masses. Cardiac: RRR, no murmurs, rubs, or gallops. No clubbing, cyanosis, edema.  Radials/DP/PT 2+ and equal bilaterally.  Respiratory:  Respirations regular and unlabored, clear to auscultation bilaterally. GI: Soft, nontender, nondistended, BS + x 4. MS: no deformity or atrophy. Skin: warm and dry, no rash. Neuro:  Strength and sensation are intact. Psych: Normal affect.  Accessory Clinical Findings    ECG personally reviewed by me today- EKG today shows atrial fibrillation my bundle branch block 96 bpm. - No acute changes  EKG 02/23/2020 atrial fibrillation with rapid ventricular response right bundle branch block left anterior fascicular block 105 bpm  EKG 02/04/2017 Sinus rhythm with premature supraventricular complexes right bundle branch block left fascicular block 80 bpm  Echocardiogram 03/26/2012 Study Conclusions   - Left ventricle: Technically limited  images. The cavity  size was normal. Wall thickness was increased in a pattern  of mild LVH. The estimated ejection fraction was 60%.  Regional wall motion abnormalities cannot be excluded.  - Left atrium: The atrium was mildly dilated.  - Right ventricle: Poorly visualized. Overall RV function  probably OK. RV pressure can not be estimated.  Transthoracic echocardiography. M-mode, complete 2D,  spectral Doppler, and color Doppler. Height: Height:  172.7cm. Height: 68in. Weight: Weight: 110.2kg. Weight:  242.5lb. Body mass index: BMI: 36.9kg/m^2. Body surface  area:  BSA: 2.67m2. Blood pressure:   145/84. Patient  status: Inpatient. Location: Bedside.   Assessment & Plan   1.  Paroxysmal atrial fibrillation-EKG today shows atrial fibrillation my bundle branch block 96 bpm.  History of paroxysmal atrial fibrillation in the setting of pulmonary embolus.  EKG today shows atrial fibrillation with RVR right bundle branch block left ventricular anterior block 105 bpm.  Discussed DCCV.  Patient wishes to defer DCCV procedure and be placed on DOAC. This patients CHA2DS2-VASc Score and unadjusted Ischemic Stroke Rate (% per year) is equal to 7.2 % stroke rate/year from a score of 5 (HTN, DM,MI,Age) CBC showed slightly low MCH and MCHC this was discussed with him and his wife.  They expressed understanding. Continue Eliquis 5 mg twice daily Avoid triggers caffeine, chocolate, EtOH etc. Increase physical activity as tolerated Heart healthy low-sodium diet   Coronary artery disease-no chest pain today. Stop aspirin Continue statin Heart healthy low-sodium diet Increase physical activity as tolerated  Hyperlipidemia-LDL 55 on 07/09/2019 Continue atorvastatin 40 mg daily Heart healthy low-sodium high-fiber diet Increase physical activity as tolerated  Essential hypertension-BP today 130/66.  Well-controlled at home. Continue  losartan 50 mg tablet daily Heart healthy  low-sodium diet-salty 6 given Increase physical activity as tolerated   Disposition: Follow-up with  Dr. Stanford Breed in 3 months.  Jossie Ng. Dorothie Wah NP-C    03/28/2020, 4:03 PM [facility] [address] [place] [address] Office [phone] Fax [phone]

## 2020-03-28 ENCOUNTER — Other Ambulatory Visit: Payer: Self-pay

## 2020-03-28 ENCOUNTER — Encounter: Payer: Self-pay | Admitting: General Practice

## 2020-03-28 ENCOUNTER — Ambulatory Visit (INDEPENDENT_AMBULATORY_CARE_PROVIDER_SITE_OTHER): Payer: Medicare Other | Admitting: General Practice

## 2020-03-28 VITALS — BP 130/66 | HR 75 | Temp 97.9°F | Wt 223.2 lb

## 2020-03-28 DIAGNOSIS — I10 Essential (primary) hypertension: Secondary | ICD-10-CM | POA: Diagnosis not present

## 2020-03-28 DIAGNOSIS — I480 Paroxysmal atrial fibrillation: Secondary | ICD-10-CM

## 2020-03-28 DIAGNOSIS — I2510 Atherosclerotic heart disease of native coronary artery without angina pectoris: Secondary | ICD-10-CM | POA: Diagnosis not present

## 2020-03-28 DIAGNOSIS — E7800 Pure hypercholesterolemia, unspecified: Secondary | ICD-10-CM

## 2020-03-28 MED ORDER — APIXABAN 5 MG PO TABS: 5.0000 mg | ORAL_TABLET | Freq: Two times a day (BID) | ORAL | 3 refills | Status: DC

## 2020-03-28 NOTE — Patient Instructions (Signed)
Medication Instructions:  START TAKING VITAMIN C DAILY THIS WILL HELP RETAIN IRON  The current medical regimen is effective;  continue present plan and medications as directed. Please refer to the Current Medication list given to you today. *If you need a refill on your cardiac medications before your next appointment, please call your pharmacy*  Special Instructions PLEASE READ AND FOLLOW INCREASED IRON DIET-ATTACHED PLEASE READ AND FOLLOW SALTY 6-ATTACHED  Follow-Up: Your next appointment:  3 month(s) Please call our office 2 months in advance to schedule this appointment In Person with Paul Ruths, MD  At [facility], you and your health needs are our priority.  As part of our continuing mission to provide you with exceptional heart care, we have created designated Provider Care Teams.  These Care Teams include your primary Cardiologist (physician) and Advanced Practice Providers (APPs -  Physician Assistants and Nurse Practitioners) who all work together to provide you with the care you need, when you need it.          Iron Deficiency Anemia, Adult Iron-deficiency anemia is when you have a low amount of red blood cells or hemoglobin. This happens because you have too little iron in your body. Hemoglobin carries oxygen to parts of the body. Anemia can cause your body to not get enough oxygen. It may or may not cause symptoms. Follow these instructions at home: Medicines  Take over-the-counter and prescription medicines only as told by your doctor. This includes iron pills (supplements) and vitamins.  If you cannot handle taking iron pills by mouth, ask your doctor about getting iron through: ? A vein (intravenously). ? A shot (injection) into a muscle.  Take iron pills when your stomach is empty. If you cannot handle this, take them with food.  Do not drink milk or take antacids at the same time as your iron pills.  To prevent trouble pooping (constipation), eat fiber  or take medicine (stool softener) as told by your doctor. Eating and drinking   Talk with your doctor before changing the foods you eat. He or she may tell you to eat foods that have a lot of iron, such as: ? Liver. ? Lowfat (lean) beef. ? Breads and cereals that have iron added to them (fortified breads and cereals). ? Eggs. ? Dried fruit. ? Dark green, leafy vegetables.  Drink enough fluid to keep your pee (urine) clear or pale yellow.  Eat fresh fruits and vegetables that are high in vitamin C. They help your body to use iron. Foods with a lot of vitamin C include: ? Oranges. ? Peppers. ? Tomatoes. ? Mangoes. General instructions  Return to your normal activities as told by your doctor. Ask your doctor what activities are safe for you.  Keep yourself clean, and keep things clean around you (your surroundings). Anemia can make you get sick more easily.  Keep all follow-up visits as told by your doctor. This is important. Contact a doctor if:  You feel sick to your stomach (nauseous).  You throw up (vomit).  You feel weak.  You are sweating for no clear reason.  You have trouble pooping, such as: ? Pooping (having a bowel movement) less than 3 times a week. ? Straining to poop. ? Having poop that is hard, dry, or larger than normal. ? Feeling full or bloated. ? Pain in the lower belly. ? Not feeling better after pooping. Get help right away if:  You pass out (faint). If this happens, do not drive yourself  to the hospital. Call your local emergency services (911 in the [place]  You have chest pain.  You have shortness of breath that: ? Is very bad. ? Gets worse with physical activity.  You have a fast heartbeat.  You get light-headed when getting up from sitting or lying down. This information is not intended to replace advice given to you by your health care provider. Make sure you discuss any questions you have with your health care provider. Document  Revised: 10/11/2017 Document Reviewed: 07/18/2016 Elsevier Patient Education  [place].

## 2020-04-02 NOTE — Progress Notes (Signed)
Subjective: [name] presents today for for annual diabetic foot examination, at risk foot care with history of diabetic neuropathy and painful mycotic nails b/l that are difficult to trim. Pain interferes with ambulation. Aggravating factors include wearing enclosed shoe gear. Pain is relieved with periodic professional debridement.  Past Medical History:  Diagnosis Date  . Asthma   . Atrial fibrillation ([place])   . CAD (coronary artery disease)    Prior PTCA  . COPD (chronic obstructive pulmonary disease) ([place])   . Diabetes mellitus   . GERD (gastroesophageal reflux disease)   . Hyperlipidemia   . Hypertension   . lung ca dx'd 01/2006  . Nephrolithiasis   . Osteopenia   . Pulmonary embolus ([place])   . Tibia/fibula fracture 07/2016   Left  . Tremor, essential     Patient Active Problem List   Diagnosis Date Noted  . Chronic maxillary sinusitis 08/07/2018  . Enchondroma 07/29/2018  . Rhinitis, chronic 05/02/2017  . Peripheral edema 05/01/2017  . Ankle fracture, left 08/24/2016  . Closed fracture of proximal end of left tibia 08/22/2016  . Tibial fracture 08/09/2016  . Tibia fracture 08/09/2016  . COPD (chronic obstructive pulmonary disease) ([place])   . Chronic respiratory failure with hypoxia ([place]) 09/11/2015  . Morbid obesity due to excess calories ([place]) Complicated by dm, hbp/ hyperlipidemia 09/11/2015  . Chronic respiratory failure ([place]) 03/07/2015  . CAFL (chronic airflow limitation) ([place]) 10/27/2014  . Diabetes mellitus, type 2 ([place]) 10/27/2014  . Diabetes mellitus type 2, uncontrolled ([place]) 10/27/2014  . Acid reflux 10/27/2014  . Benign essential HTN 10/27/2014  . History of primary bronchial cancer 10/27/2014  . Extreme obesity 10/27/2014  . OP (osteoporosis) 10/27/2014  . Peripheral neuropathy ([place]) 10/27/2014  . Hypercholesterolemia without hypertriglyceridemia 10/27/2014  . Compulsive tobacco user syndrome 10/27/2014  . Cough 06/21/2014  . Bronchitis  06/10/2014  . CAD (coronary artery disease) 04/08/2012  . Hyperlipidemia 04/08/2012  . Hypertension 04/08/2012  . Cigarette smoker 04/08/2012  . AF (paroxysmal atrial fibrillation) ([place]) 03/27/2012  . Exercise hypoxemia 03/25/2012  . COPD GOLD II/ still smoking  03/25/2012  . DM (diabetes mellitus), type 2, uncontrolled with complications ([place]) 19/14/7829  . Malignant neoplasm of bronchus and lung, unspecified site 10/11/2011    Past Surgical History:  Procedure Laterality Date  . Angioplasty  Approx 1993  . CARDIAC CATHETERIZATION  Approximately 1993  . CHOLECYSTECTOMY    . 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 FIXATION KYPHOPLASTY    . LUNG CANCER SURGERY  01/2006   Small excision of left lung tissue; mesh with radioactive seeds (per pt report)  . ORIF TIBIA FRACTURE Left 08/22/2016   Procedure: OPEN REDUCTION INTERNAL FIXATION (ORIF) TIBIA FRACTURE;  Surgeon: Nicholes Stairs, MD;  Location: [facility];  Service: Orthopedics;  Laterality: Left;  . ROTATOR CUFF TEAR  2016  . Tibia/Fibula Repair Left 07/2016  . TONSILLECTOMY      Current Outpatient Medications on File Prior to Visit  Medication Sig Dispense Refill  . acetaminophen (TYLENOL) 500 MG tablet Take 1,000 mg by mouth daily.     Marland Kitchen albuterol (PROAIR HFA) 108 (90 BASE) MCG/ACT inhaler Inhale 2 puffs into the lungs every 2 (two) hours as needed for wheezing or shortness of breath.     Marland Kitchen amoxicillin-clavulanate (AUGMENTIN) 875-125 MG tablet Take 1 tablet by mouth 2 (two) times daily. 40 tablet 0  . atorvastatin (LIPITOR) 40 MG tablet TAKE 1 TABLET BY MOUTH AT  BEDTIME 90 tablet 0  .  budesonide-formoterol (SYMBICORT) 160-4.5 MCG/ACT inhaler TAKE 2 PUFFS FIRST THING IN THE MORNING AND THEN  ANOTHER 2 PUFFS ABOUT 12  HOURS LATER. 30.6 g 3  . Budesonide-Formoterol Fumarate (SYMBICORT IN) Symbicort    . calcium carbonate (OS-CAL) 600 MG TABS tablet Take 600 mg by mouth daily with supper.     . calcium carbonate (OS-CAL) 600 MG  TABS tablet Take by mouth.    . ergocalciferol (VITAMIN D2) 1.25 MG (50000 UT) capsule Vitamin D2 1,250 mcg (50,000 unit) capsule    . fexofenadine (ALLEGRA) 180 MG tablet Take 180 mg by mouth daily with breakfast.     . fluticasone (FLONASE) 50 MCG/ACT nasal spray Place 1 spray into the nose 2 (two) times daily.     . Fluticasone-Salmeterol (ADVAIR) 100-50 MCG/DOSE AEPB fluticasone 100 mcg-salmeterol 50 mcg/dose blistr powdr for inhalation    . furosemide (LASIX) 20 MG tablet TAKE 1 TABLET BY MOUTH  DAILY 90 tablet 0  . gabapentin (NEURONTIN) 300 MG capsule 4 at bedtime    . GABAPENTIN PO gabapentin    . glimepiride (AMARYL) 2 MG tablet Take 2 mg by mouth 2 (two) times daily.     Marland Kitchen guaiFENesin (MUCINEX) 600 MG 12 hr tablet Take 1,200 mg by mouth daily with breakfast.     . HYDROcodone-acetaminophen (NORCO) 10-325 MG tablet hydrocodone 10 mg-acetaminophen 325 mg tablet    . losartan (COZAAR) 50 MG tablet Take 1 tablet (50 mg total) by mouth daily. (Patient taking differently: Take 50 mg by mouth daily with breakfast. ) 90 tablet 0  . metFORMIN (GLUCOPHAGE) 1000 MG tablet Take 1,000 mg by mouth 2 (two) times daily with a meal.      . metoCLOPramide (REGLAN) 10 MG tablet Take 5 mg by mouth 3 (three) times daily before meals.     . Multiple Vitamins-Minerals (CENTRUM SILVER 50+MEN) TABS Take by mouth.    . Multiple Vitamins-Minerals (CENTRUM SILVER ADULT 50+) TABS Take 1 tablet by mouth daily with breakfast.    . omeprazole (PRILOSEC) 20 MG capsule Take 20 mg by mouth daily.    . OXYGEN 4L of oxygen at night and 3L of oxygen when walking long distances    . SPIRIVA RESPIMAT 2.5 MCG/ACT AERS USE 2 SPRAYS (INHALATIONS)  EVERY MORNING 12 g 3  . Tiotropium Bromide Monohydrate (SPIRIVA RESPIMAT) 2.5 MCG/ACT AERS USE 2 PUFFS EVERY MORNING 3 Inhaler 3  . Tiotropium Bromide Monohydrate (SPIRIVA RESPIMAT) 2.5 MCG/ACT AERS Spiriva Respimat 2.5 mcg/actuation solution for inhalation    . Vitamin D,  Ergocalciferol, (DRISDOL) 50000 UNITS CAPS capsule Take 50,000 Units by mouth every 14 (fourteen) days.      No current facility-administered medications on file prior to visit.     Allergies  Allergen Reactions  . Codeine Anaphylaxis  . Phenobarbital Hypertension    Social History   Occupational History    Employer: RETIRED    Comment: Retired from [employer]  Tobacco Use  . Smoking status: Current Every Day Smoker    Packs/day: 0.50    Years: 59.00    Pack years: 29.50    Types: Cigarettes  . Smokeless tobacco: Never Used  Substance and Sexual Activity  . Alcohol use: Yes    Alcohol/week: 1.0 standard drinks    Types: 1 Glasses of wine per week    Comment: Rare  . Drug use: No  . Sexual activity: Never    Family History  Problem Relation Age of Onset  . Heart disease Mother   .  Tremor Mother   . Dementia Mother   . Heart Problems Father     Immunization History  Administered Date(s) Administered  . Influenza Split 09/12/2012, 08/12/2013  . PFIZER SARS-COV-2 Vaccination 12/18/2019, 01/12/2020  . Pneumococcal Conjugate-13 11/12/2013  . Pneumococcal Polysaccharide-23 11/12/2010     Objective: There were no vitals filed for this visit.  Grae DECARLO RIVET is a/an 79 y.o. adult  in NAD. AAO X 3.  Vascular Examination: Capillary fill time to digits <3 seconds b/l. Palpable DP pulses b/l. Palpable PT pulses b/l. Pedal hair absent b/l Skin temperature gradient within normal limits b/l. No edema noted b/l. No pain with calf compression b/l.  Dermatological Examination: Pedal skin with normal turgor, texture and tone bilaterally. No open wounds bilaterally. No interdigital macerations bilaterally. Toenails 1-5 b/l elongated, dystrophic, thickened, crumbly with subungual debris and tenderness to dorsal palpation. Hyperkeratotic lesion(s) plantarmedial midfoot b/l. He has subdermal hemorrhage noted sub talonavicular joint. There is no erythema, no edema, no drainage nor  flocculence palpated. No abnormal warmth/coolness.  No erythema, no edema, no drainage, no flocculence.  Musculoskeletal Examination: Normal muscle strength 5/5 to all lower extremity muscle groups bilaterally. No pain crepitus or joint limitation noted with ROM b/l. Pes planus deformity noted b/l.  Today's visit he is  noted to have more plantarmedial midfoot collapse.  Neurological Examination: Protective sensation diminished with 10g monofilament b/l. Vibratory sensation decreased b/l.  Assessment: 1. Encounter for diabetic foot exam ([place])   2. Pain due to onychomycosis of toenails of both feet   3. Callus   4. Pes planus of both feet   5. Diabetic peripheral neuropathy associated with type 2 diabetes mellitus ([place])     Plan: -Examined patient. -Diabetic foot examination performed on today's visit. -Continue diabetic foot care principles. Literature dispensed on today.  -With new findings of plantar medial midfoot collapse with calluses bilaterally, I have asked him to consider custom diabetic shoes.  He states he has a new pair he has not worn at home and he will start wearing them.  I have asked him to bring the shoes/insoles in on his next visit.  He related understanding. -Toenails 1-5 b/l were debrided in length and girth with sterile nail nippers and dremel without iatrogenic bleeding.  -Calluses pared plantarmedial midfoot b/l utilizing sterile scalpel blade without incident. -Patient to continue soft, supportive shoe gear daily. -Patient to report any pedal injuries to medical professional immediately. -Patient/POA to call should there be question/concern in the interim.  Return in about 3 months (around 06/24/2020) for diabetic nail trim/ Eliquis.

## 2020-04-04 ENCOUNTER — Telehealth: Payer: Self-pay | Admitting: Podiatry

## 2020-04-19 ENCOUNTER — Other Ambulatory Visit: Payer: Self-pay

## 2020-04-19 ENCOUNTER — Ambulatory Visit: Payer: Medicare Other | Admitting: Orthotics

## 2020-04-19 DIAGNOSIS — M2142 Flat foot [pes planus] (acquired), left foot: Secondary | ICD-10-CM

## 2020-04-19 DIAGNOSIS — E1142 Type 2 diabetes mellitus with diabetic polyneuropathy: Secondary | ICD-10-CM

## 2020-04-19 DIAGNOSIS — E119 Type 2 diabetes mellitus without complications: Secondary | ICD-10-CM

## 2020-04-19 DIAGNOSIS — L84 Corns and callosities: Secondary | ICD-10-CM

## 2020-04-19 DIAGNOSIS — M2141 Flat foot [pes planus] (acquired), right foot: Secondary | ICD-10-CM

## 2020-04-19 NOTE — Progress Notes (Signed)
Patient presents with significant RF valgus, pes planus.  Dr. Elisha Ponder suggests, and I concurr, patient would best be served by CUSTOM DB shoes.   Patient chose [place] from [facility]

## 2020-05-23 ENCOUNTER — Other Ambulatory Visit: Payer: Self-pay

## 2020-05-23 ENCOUNTER — Encounter: Payer: Self-pay | Admitting: Internal Medicine

## 2020-05-23 ENCOUNTER — Ambulatory Visit (INDEPENDENT_AMBULATORY_CARE_PROVIDER_SITE_OTHER): Payer: Medicare Other | Admitting: Internal Medicine

## 2020-05-23 DIAGNOSIS — F17210 Nicotine dependence, cigarettes, uncomplicated: Secondary | ICD-10-CM | POA: Diagnosis not present

## 2020-05-23 DIAGNOSIS — R0902 Hypoxemia: Secondary | ICD-10-CM | POA: Diagnosis not present

## 2020-05-23 DIAGNOSIS — I2510 Atherosclerotic heart disease of native coronary artery without angina pectoris: Secondary | ICD-10-CM | POA: Diagnosis not present

## 2020-05-23 DIAGNOSIS — J449 Chronic obstructive pulmonary disease, unspecified: Secondary | ICD-10-CM

## 2020-05-23 NOTE — Progress Notes (Signed)
Subjective:    Patient ID: Paul [name], male    DOB: 1941-11-09  MRN: [id number]    Brief patient profile:  37 yowm  active smoker with dx of copd with worse symptoms since 2013 referred to pulmonary clinic 12/24/2012 by Dr Darcus Austin for COPD evaluation with GOLD II criteria May 2014    History of Present Illness  12/24/2012 1st pulmonary eval  On advair/ spiriva longterm with freq use of proventil prior to dx  PE presenting as pleurtic L CP May 2013 with "moderate clot burden" to  [facility] assoc with PAF and started on Xarelto and baseline doe x climbing steps but could walk flat all day long before and after PE but then indolent onset doe progressively worse x 9 months assoc with cough congestion > green mucus better p on levaquin x one week but convinced his coughing and breathing problems are all due to xarelto because of what he read on the package. rec Only use your albuterol (proaire) as a rescue medication (plan B)  to be used if you can't catch your breath   The key is to stop smoking completely before smoking completely stops you           08/05/2017  f/u ov/Tarina Volk re:   COPD II/ still smoking < 10 per day maint symb/spiriva  Chief Complaint  Patient presents with  . Follow-up    Breathing is overall doing well. He states he is coughing more since he has been home from the nursing home. His cough is prod with brown sputum. He is using his proair 4 x daily on average.   symbicort 160/spiriva 2.5  Worse breathing when not at home = stuffy nose cough not an issue while living at rehab  Convinced it's the formaldehyde in the house and only smokes outside  preferes allegra over zyrtec  MMRC3 = can't walk 100 yards even at a slow pace at a flat grade s stopping due to sob  / 02 4lpm hs  Really no change doe or cough x last year unless leaves house, then improved  02 4lpm hs/ no am ha/ feels sleeps well  rec The key is to stop smoking completely before smoking completely stops you!   Breathe clear air       05/23/2020  f/u ov/Cledith Kamiya re: copd GOLD II/ still smoking / seocnd pfizer 01/12/20 Misunderstood dosing on both symb and spiriva (using half the rec amt)  Chief Complaint  Patient presents with  . Follow-up  Dyspnea:  Room to room or [facility] to building stops half way using walking  Cough: more productive x  Sleeping: ok bed is flat/ wedge to 30 degrees  SABA use: never prechallenges/ only uses p ex  02: 4lpm concentrator sleeping / nothing at rest / back on 4lpm with activity    No obvious day to day or daytime variability or assoc excess/ purulent sputum or mucus plugs or hemoptysis or cp or chest tightness, subjective wheeze or overt sinus or hb symptoms.   Sleeping as above  without nocturnal  or early am exacerbation  of respiratory  c/o's or need for noct saba. Also denies any obvious fluctuation of symptoms with weather or environmental changes or other aggravating or alleviating factors except as outlined above   No unusual exposure hx or h/o childhood pna/ asthma or knowledge of premature birth.  Current Allergies, Complete Past Medical History, Past Surgical History, Family History, and Social History were reviewed in [employer]  electronic medical record.  ROS  The following are not active complaints unless bolded Hoarseness, sore throat, dysphagia, dental problems, itching, sneezing,  nasal congestion or discharge of excess mucus or purulent secretions, ear ache,   fever, chills, sweats, unintended wt loss or wt gain, classically pleuritic or exertional cp,  orthopnea pnd or arm/hand swelling  or leg swelling, presyncope, palpitations, abdominal pain, anorexia, nausea, vomiting, diarrhea  or change in bowel habits or change in bladder habits, change in stools or change in urine, dysuria, hematuria,  rash, arthralgias, visual complaints, headache, numbness, weakness or ataxia or problems with walking or coordination,  change in mood or  memory.         Current Meds  Medication Sig  . acetaminophen (TYLENOL) 500 MG tablet Take 1,000 mg by mouth daily.   Marland Kitchen albuterol (PROAIR HFA) 108 (90 BASE) MCG/ACT inhaler Inhale 2 puffs into the lungs every 2 (two) hours as needed for wheezing or shortness of breath.   Marland Kitchen apixaban (ELIQUIS) 5 MG TABS tablet Take 1 tablet (5 mg total) by mouth 2 (two) times daily.  Marland Kitchen atorvastatin (LIPITOR) 40 MG tablet TAKE 1 TABLET BY MOUTH AT  BEDTIME  . budesonide-formoterol (SYMBICORT) 160-4.5 MCG/ACT inhaler TAKE 2 PUFFS FIRST THING IN THE MORNING AND THEN  ANOTHER 2 PUFFS ABOUT 12  HOURS LATER.  . calcium carbonate (OS-CAL) 600 MG TABS tablet Take by mouth.  . fexofenadine (ALLEGRA) 180 MG tablet Take 180 mg by mouth daily with breakfast.   . fluticasone (FLONASE) 50 MCG/ACT nasal spray Place 1 spray into the nose 2 (two) times daily.   . furosemide (LASIX) 20 MG tablet TAKE 1 TABLET BY MOUTH  DAILY  . gabapentin (NEURONTIN) 300 MG capsule 4 at bedtime  . glimepiride (AMARYL) 2 MG tablet Take 2 mg by mouth 2 (two) times daily.   Marland Kitchen guaiFENesin (MUCINEX) 600 MG 12 hr tablet Take 1,200 mg by mouth daily with breakfast.   . losartan (COZAAR) 50 MG tablet Take 1 tablet (50 mg total) by mouth daily. (Patient taking differently: Take 50 mg by mouth daily with breakfast. )  . metFORMIN (GLUCOPHAGE) 1000 MG tablet Take 1,000 mg by mouth 2 (two) times daily with a meal.    . metoCLOPramide (REGLAN) 10 MG tablet Take 5 mg by mouth 3 (three) times daily before meals.   . Multiple Vitamins-Minerals (CENTRUM SILVER 50+MEN) TABS Take by mouth.  . Multiple Vitamins-Minerals (CENTRUM SILVER ADULT 50+) TABS Take 1 tablet by mouth daily with breakfast.  . omeprazole (PRILOSEC) 20 MG capsule Take 20 mg by mouth daily.  . OXYGEN 4L of oxygen at night and 3L of oxygen when walking long distances  . SPIRIVA RESPIMAT 2.5 MCG/ACT AERS USE 2 SPRAYS (INHALATIONS)  EVERY MORNING  .                     Objective:   Physical  Exam  02/05/2013  Wt 266 > 03/26/2013  269 > 06/21/2014 261 > 08/04/2014  256 >257 03/07/2015 > 09/05/2015  254 > 01/26/2016  250 > 08/01/2016  248 > 01/29/2017   250   > 05/01/2017   248  > 08/05/2017   249 > 12/05/2017   235  > 07/01/2018   222  05/23/2020   220   Vital signs reviewed  05/23/2020  - Note at rest 02 sats  98% on RA  Obese wm nad  HEENT : pt wearing mask not removed for exam due to  covid -19 concerns.    NECK :  without JVD/Nodes/TM/ nl carotid upstrokes bilaterally   LUNGS: no acc muscle use,  Mod barrel  contour chest wall with bilateral  Distant bs s audible wheeze and  without cough on insp or exp maneuvers and mod  Hyperresonant  to  percussion bilaterally     CV:  RRR  no s3 or murmur or increase in P2, and no edema   ABD: quite obese  soft and nontender with pos mid insp Hoover's  in the supine position. No bruits or organomegaly appreciated, bowel sounds nl  MS:   Walks bent over walker, slow pace   ext warm without deformities, calf tenderness, cyanosis or clubbing No obvious joint restrictions   SKIN: warm and dry without lesions    NEURO:  alert, approp, nl sensorium with  no motor or cerebellar deficits apparent.                                Assessment & Plan:

## 2020-05-23 NOTE — Patient Instructions (Signed)
Make sure you check your oxygen saturations at highest level of activity to be sure it stays over 90% and adjust upward to maintain this level if needed but remember to turn it back to previous settings when you stop (to conserve your supply).    Plan A = Automatic = Always=  Symbicort 160 Take 2 puffs first thing in am and then another 2 puffs about 12 hours later  And spiriva 2puffs thing in am   Plan B = Backup (to supplement plan A, not to replace it) Only use your albuterol inhaler as a rescue medication to be used if you can't catch your breath by resting or doing a relaxed purse lip breathing pattern.  - The less you use it, the better it will work when you need it. - Ok to use the inhaler up to 2 puffs  every 4 hours if you must but call for appointment if use goes up over your usual need - Don't leave home without it !!  (think of it like the spare tire for your car)     Please schedule a follow up visit in 12 months but call sooner if needed

## 2020-05-25 ENCOUNTER — Encounter: Payer: Self-pay | Admitting: Internal Medicine

## 2020-05-25 ENCOUNTER — Telehealth: Payer: Self-pay | Admitting: Podiatry

## 2020-05-25 NOTE — Assessment & Plan Note (Signed)
06/21/2014   Walked RA x one lap @ 185 stopped due to  desat to 84% but no more sob than usual, nl pace> declined 02  03/01/15  desat to 86% on 70m corrected on 3lpm >rec 3lpm walking  More that around the house  -  05/23/2020   [facility]  2 laps @ approx 2560feach @ very slow  pace  stopped due  sob with sats still 93%  I suspect he is so deconditioned now that he does not reach a high enough 02 demand to desaturate and no longer qualifies for amb 02.    Still has 02 concentrator to use at hs at 4lpm and sleeps fine s am ha/drowsiness so rec keep for now but may be required to to ONO study to qualify for this.  >>> pulmonary f/u is prn          Each maintenance medication was reviewed in detail including emphasizing most importantly the difference between maintenance and prns and under what circumstances the prns are to be triggered using an action plan format where appropriate.  Total time for H and P, chart review, counseling, teaching device/  directly observing portions of ambulatory 02 saturation study/  and generating customized AVS unique to this office visit / charting = 20 min

## 2020-05-25 NOTE — Assessment & Plan Note (Signed)
Active smoker    - PFT's 03/12/13 FEV1  2.27 (79%) ratio 52 and no better p B2 DLCO 45 and 60% p correction - Ex desats documented 06/21/14 > refused 02 > agreed to accept 03/26/15 see chronic resp failure  - 01/26/2016  rx symbicort instead of advair - PFT's  08/01/2016  FEV1 2.03  (76 % ) ratio 57  p 8 % improvement from saba p symbicort 160 x one puff  prior to study with DLCO  37/39 % corrects to 42  % for alv volume  > increase symb 160 2bid  - added spiriva [facility]  01/29/2017  - 08/05/2017  After extensive coaching HFA effectiveness =    90%   Not using correct doses of symb or spiriva or using saba approp  Reviewed: I spent extra time with pt today reviewing appropriate use of albuterol for prn use on exertion with the following points: 1) saba is for relief of sob that does not improve by walking a slower pace or resting but rather if the pt does not improve after trying this first. 2) If the pt is convinced, as many are, that saba helps recover from activity faster then it's easy to tell if this is the case by re-challenging : ie stop, take the inhaler, then p 5 minutes try the exact same activity (intensity of workload) that just caused the symptoms and see if they are substantially diminished or not after saba 3) if there is an activity that reproducibly causes the symptoms, try the saba 15 min before the activity on alternate days   If in fact the saba really does help, then fine to continue to use it prn but advised may need to look closer at the maintenance regimen being used to achieve better control of airways disease with exertion.

## 2020-05-25 NOTE — Telephone Encounter (Signed)
Pt left message _0  asking if we got the paperwork from the pcp for his diabetic shoes.   I returned call and explained that the paperwork was not valid due to the last office visit date states 8.2020 and pt stated he was seen 3.2021. I have refaxed the paperwork with a note to get the updated appt added.

## 2020-05-25 NOTE — Assessment & Plan Note (Addendum)
>  3 min discussion re active cigarette smoking in addition to office E&M  Ask about tobacco use:   Ongoing  Advise quitting   I took an extended  opportunity with this patient to outline the consequences of continued cigarette use  in airway disorders based on all the data we have from the multiple national lung health studies (perfomed over decades at millions of dollars in cost)  indicating that smoking cessation, not choice of inhalers or physicians, is the most important aspect of his care.   Assess willingness:  Not committed at this point Assist in quit attempt:  Per PCP when ready Arrange follow up:   Follow up per Primary Care planned

## 2020-05-30 ENCOUNTER — Telehealth: Payer: Self-pay | Admitting: Podiatry

## 2020-05-30 NOTE — Telephone Encounter (Signed)
Pt called and left message checking to see if we got the updated paperwork for the diabetic shoes.  I returned call and explained I have not gotten it but it may take a few days for it to be uploaded. He stated that he also left message for the assistant at his pcp

## 2020-06-03 ENCOUNTER — Ambulatory Visit (INDEPENDENT_AMBULATORY_CARE_PROVIDER_SITE_OTHER): Payer: Medicare Other | Admitting: Orthotics

## 2020-06-03 ENCOUNTER — Other Ambulatory Visit: Payer: Self-pay

## 2020-06-03 DIAGNOSIS — M2141 Flat foot [pes planus] (acquired), right foot: Secondary | ICD-10-CM

## 2020-06-03 DIAGNOSIS — S9032XA Contusion of left foot, initial encounter: Secondary | ICD-10-CM

## 2020-06-03 DIAGNOSIS — E119 Type 2 diabetes mellitus without complications: Secondary | ICD-10-CM

## 2020-06-03 DIAGNOSIS — L84 Corns and callosities: Secondary | ICD-10-CM | POA: Diagnosis not present

## 2020-06-03 DIAGNOSIS — E1142 Type 2 diabetes mellitus with diabetic polyneuropathy: Secondary | ICD-10-CM

## 2020-06-03 DIAGNOSIS — M2142 Flat foot [pes planus] (acquired), left foot: Secondary | ICD-10-CM

## 2020-06-03 NOTE — Progress Notes (Signed)
Patient came in today to pick up CUSTOMdiabetic shoes and custom inserts.  Same was well pleased with fit and function.   The patient could ambulate without any discomfort; there were no signs of any quality issues. The foot ortheses offered full contact with plantar surface and contoured the arch well.   The shoes fit well with no heel slippage and areas of pressure concern.   Patient advised to contact us if any problems arise.  Patient also advised on how to report any issues.

## 2020-06-24 ENCOUNTER — Encounter: Payer: Self-pay | Admitting: Podiatry

## 2020-06-24 ENCOUNTER — Other Ambulatory Visit: Payer: Self-pay

## 2020-06-24 ENCOUNTER — Ambulatory Visit (INDEPENDENT_AMBULATORY_CARE_PROVIDER_SITE_OTHER): Payer: Medicare Other | Admitting: Podiatry

## 2020-06-24 DIAGNOSIS — M79675 Pain in left toe(s): Secondary | ICD-10-CM | POA: Diagnosis not present

## 2020-06-24 DIAGNOSIS — M79674 Pain in right toe(s): Secondary | ICD-10-CM

## 2020-06-24 DIAGNOSIS — E1142 Type 2 diabetes mellitus with diabetic polyneuropathy: Secondary | ICD-10-CM

## 2020-06-24 DIAGNOSIS — L84 Corns and callosities: Secondary | ICD-10-CM

## 2020-06-24 DIAGNOSIS — B351 Tinea unguium: Secondary | ICD-10-CM

## 2020-06-25 NOTE — Progress Notes (Signed)
Subjective: [name] presents today for for annual diabetic foot examination, at risk foot care with history of diabetic neuropathy and painful mycotic nails b/l that are difficult to trim. Pain interferes with ambulation. Aggravating factors include wearing enclosed shoe gear. Pain is relieved with periodic professional debridement.  He has received his diabetic shoes/inserts. He voices no problems with them. States he wears them most of the day and switches to his slippers during the evening.  Past Medical History:  Diagnosis Date  . Asthma   . Atrial fibrillation ([place])   . CAD (coronary artery disease)    Prior PTCA  . COPD (chronic obstructive pulmonary disease) ([place])   . Diabetes mellitus   . GERD (gastroesophageal reflux disease)   . Hyperlipidemia   . Hypertension   . lung ca dx'd 01/2006  . Nephrolithiasis   . Osteopenia   . Pulmonary embolus ([place])   . Tibia/fibula fracture 07/2016   Left  . Tremor, essential     Patient Active Problem List   Diagnosis Date Noted  . Chronic maxillary sinusitis 08/07/2018  . Enchondroma 07/29/2018  . Rhinitis, chronic 05/02/2017  . Peripheral edema 05/01/2017  . Ankle fracture, left 08/24/2016  . Closed fracture of proximal end of left tibia 08/22/2016  . Tibial fracture 08/09/2016  . Tibia fracture 08/09/2016  . COPD (chronic obstructive pulmonary disease) ([place])   . Chronic respiratory failure with hypoxia ([place]) 09/11/2015  . Morbid obesity due to excess calories ([place]) Complicated by dm, hbp/ hyperlipidemia 09/11/2015  . Chronic respiratory failure ([place]) 03/07/2015  . CAFL (chronic airflow limitation) ([place]) 10/27/2014  . Diabetes mellitus, type 2 ([place]) 10/27/2014  . Diabetes mellitus type 2, uncontrolled ([place]) 10/27/2014  . Acid reflux 10/27/2014  . Benign essential HTN 10/27/2014  . History of primary bronchial cancer 10/27/2014  . Extreme obesity 10/27/2014  . OP (osteoporosis) 10/27/2014  . Peripheral neuropathy ([place])  10/27/2014  . Hypercholesterolemia without hypertriglyceridemia 10/27/2014  . Compulsive tobacco user syndrome 10/27/2014  . Cough 06/21/2014  . Bronchitis 06/10/2014  . CAD (coronary artery disease) 04/08/2012  . Hyperlipidemia 04/08/2012  . Hypertension 04/08/2012  . Cigarette smoker 04/08/2012  . AF (paroxysmal atrial fibrillation) ([place]) 03/27/2012  . Exercise hypoxemia 03/25/2012  . COPD GOLD II/ still smoking  03/25/2012  . DM (diabetes mellitus), type 2, uncontrolled with complications ([place]) 54/65/6812  . Malignant neoplasm of bronchus and lung, unspecified site 10/11/2011    Past Surgical History:  Procedure Laterality Date  . Angioplasty  Approx 1993  . CARDIAC CATHETERIZATION  Approximately 1993  . CHOLECYSTECTOMY    . 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 FIXATION KYPHOPLASTY    . LUNG CANCER SURGERY  01/2006   Small excision of left lung tissue; mesh with radioactive seeds (per pt report)  . ORIF TIBIA FRACTURE Left 08/22/2016   Procedure: OPEN REDUCTION INTERNAL FIXATION (ORIF) TIBIA FRACTURE;  Surgeon: Nicholes Stairs, MD;  Location: [facility];  Service: Orthopedics;  Laterality: Left;  . ROTATOR CUFF TEAR  2016  . Tibia/Fibula Repair Left 07/2016  . TONSILLECTOMY      Current Outpatient Medications on File Prior to Visit  Medication Sig Dispense Refill  . acetaminophen (TYLENOL) 500 MG tablet Take 1,000 mg by mouth daily.     Marland Kitchen albuterol (PROAIR HFA) 108 (90 BASE) MCG/ACT inhaler Inhale 2 puffs into the lungs every 2 (two) hours as needed for wheezing or shortness of breath.     Marland Kitchen apixaban (ELIQUIS) 5 MG TABS tablet  Take 1 tablet (5 mg total) by mouth 2 (two) times daily. 180 tablet 3  . atorvastatin (LIPITOR) 40 MG tablet TAKE 1 TABLET BY MOUTH AT  BEDTIME 90 tablet 0  . budesonide-formoterol (SYMBICORT) 160-4.5 MCG/ACT inhaler TAKE 2 PUFFS FIRST THING IN THE MORNING AND THEN  ANOTHER 2 PUFFS ABOUT 12  HOURS LATER. 30.6 g 3  . calcium carbonate (OS-CAL) 600 MG  TABS tablet Take by mouth.    . fexofenadine (ALLEGRA) 180 MG tablet Take 180 mg by mouth daily with breakfast.     . fluticasone (FLONASE) 50 MCG/ACT nasal spray Place 1 spray into the nose 2 (two) times daily.     . furosemide (LASIX) 20 MG tablet TAKE 1 TABLET BY MOUTH  DAILY 90 tablet 0  . gabapentin (NEURONTIN) 300 MG capsule 4 at bedtime    . glimepiride (AMARYL) 2 MG tablet Take 2 mg by mouth 2 (two) times daily.     Marland Kitchen guaiFENesin (MUCINEX) 600 MG 12 hr tablet Take 1,200 mg by mouth daily with breakfast.     . losartan (COZAAR) 50 MG tablet Take 1 tablet (50 mg total) by mouth daily. (Patient taking differently: Take 50 mg by mouth daily with breakfast. ) 90 tablet 0  . metFORMIN (GLUCOPHAGE) 1000 MG tablet Take 1,000 mg by mouth 2 (two) times daily with a meal.      . metoCLOPramide (REGLAN) 10 MG tablet Take 5 mg by mouth 3 (three) times daily before meals.     . Multiple Vitamins-Minerals (CENTRUM SILVER ADULT 50+) TABS Take 1 tablet by mouth daily with breakfast.    . omeprazole (PRILOSEC) 20 MG capsule Take 20 mg by mouth daily.    . OXYGEN 4L of oxygen at night and 3L of oxygen when walking long distances    . SPIRIVA RESPIMAT 2.5 MCG/ACT AERS USE 2 SPRAYS (INHALATIONS)  EVERY MORNING 12 g 3   No current facility-administered medications on file prior to visit.     Allergies  Allergen Reactions  . Codeine Anaphylaxis  . Phenobarbital Hypertension    Social History   Occupational History    Employer: RETIRED    Comment: Retired from [employer]  Tobacco Use  . Smoking status: Current Every Day Smoker    Packs/day: 0.50    Years: 59.00    Pack years: 29.50    Types: Cigarettes  . Smokeless tobacco: Never Used  Substance and Sexual Activity  . Alcohol use: Yes    Alcohol/week: 1.0 standard drink    Types: 1 Glasses of wine per week    Comment: Rare  . Drug use: No  . Sexual activity: Never    Family History  Problem Relation Age of Onset  . Heart disease Mother    . Tremor Mother   . Dementia Mother   . Heart Problems Father     Immunization History  Administered Date(s) Administered  . Influenza Split 09/12/2012, 08/12/2013  . PFIZER SARS-COV-2 Vaccination 12/18/2019, 01/12/2020  . Pneumococcal Conjugate-13 11/12/2013  . Pneumococcal Polysaccharide-23 11/12/2010     Objective: There were no vitals filed for this visit.  Paul [name] is an 79 y.o. adult male, morbidly obese, in NAD. AAO X 3.  Vascular Examination: Capillary fill time to digits <3 seconds b/l. Palpable DP pulses b/l. Palpable PT pulses b/l. Pedal hair absent b/l Skin temperature gradient within normal limits b/l. No edema noted b/l. No pain with calf compression b/l.  Dermatological Examination: Pedal skin with  normal turgor, texture and tone bilaterally. No open wounds bilaterally. No interdigital macerations bilaterally. Toenails 1-5 b/l elongated, dystrophic, thickened, crumbly with subungual debris and tenderness to dorsal palpation. Hyperkeratotic lesion(s) plantarmedial midfoot left foot. He has subdermal hemorrhage noted sub talonavicular joint left foot. There is no erythema, no edema, no drainage nor flocculence palpated. No abnormal warmth/coolness.  No erythema, no edema, no drainage, no flocculence.   Incurvated nailplate left great toe lateral border(s) with tenderness to palpation and pinpoint purulence at distal edge. No erythema, no edema.  Musculoskeletal Examination: Normal muscle strength 5/5 to all lower extremity muscle groups bilaterally. No pain crepitus or joint limitation noted with ROM b/l. Pes planus deformity noted b/l.  Plantarmedial midfoot collapse b/l.   Neurological Examination: Protective sensation diminished with 10g monofilament b/l. Vibratory sensation decreased b/l.  Assessment: 1. Pain due to onychomycosis of toenails of both feet   2. Pre-ulcerative calluses   3. Diabetic peripheral neuropathy associated with type 2 diabetes  mellitus ([place])     Plan: -Examined patient. -Diabetic foot examination performed on today's visit. -Continue diabetic foot care principles. Literature dispensed on today.  -Toenails 1-5 b/l were debrided in length and girth with sterile nail nippers and dremel without iatrogenic bleeding.  -Offending nail border debrided and curretaged left hallux. Border irrigated with alcohol and triple antibiotic applied. He was instructed to apply Neosporin to left hallux once daily for 2 weeks. -Preulcerative callus pared plantarmedial midfoot left foot utilizing sterile scalpel blade without incident. Will have Pedorthist offload inserts more where calluses are. -Patient to continue soft, supportive shoe gear daily. -Patient to report any pedal injuries to medical professional immediately. -Patient/POA to call should there be question/concern in the interim.  Return in about 1 month (around 07/25/2020) for preulcerative callus left foot.

## 2020-06-29 NOTE — Progress Notes (Signed)
HPI: FU CAD and atrial fibrillation. Patient states had PCI of LAD in 1988. Patient previously admitted with a pulmonary embolus. During hospitalization he developed atrial fibrillation with a rapid ventricular response. Echocardiogram in May of 2013 showed normal LV function and mild left ventricular hypertrophy. Nuclear study 3/17 showed EF 65, attenuation artifact and no ischemia.   Diagnosed with recurrent atrial fibrillation April 2021.  Since I last saw him,  he has dyspnea on exertion unchanged.  No orthopnea, PND, pedal edema, chest pain, palpitations or syncope.  Current Outpatient Medications  Medication Sig Dispense Refill  . acetaminophen (TYLENOL) 500 MG tablet Take 1,000 mg by mouth daily.     Marland Kitchen albuterol (PROAIR HFA) 108 (90 BASE) MCG/ACT inhaler Inhale 2 puffs into the lungs every 2 (two) hours as needed for wheezing or shortness of breath.     Marland Kitchen apixaban (ELIQUIS) 5 MG TABS tablet Take 1 tablet (5 mg total) by mouth 2 (two) times daily. 180 tablet 3  . atorvastatin (LIPITOR) 40 MG tablet TAKE 1 TABLET BY MOUTH AT  BEDTIME 90 tablet 0  . budesonide-formoterol (SYMBICORT) 160-4.5 MCG/ACT inhaler TAKE 2 PUFFS FIRST THING IN THE MORNING AND THEN  ANOTHER 2 PUFFS ABOUT 12  HOURS LATER. 30.6 g 3  . calcium carbonate (OS-CAL) 600 MG TABS tablet Take by mouth.    . fexofenadine (ALLEGRA) 180 MG tablet Take 180 mg by mouth daily with breakfast.     . fluticasone (FLONASE) 50 MCG/ACT nasal spray Place 1 spray into the nose 2 (two) times daily.     . furosemide (LASIX) 20 MG tablet TAKE 1 TABLET BY MOUTH  DAILY 90 tablet 0  . gabapentin (NEURONTIN) 300 MG capsule 4 at bedtime    . glimepiride (AMARYL) 2 MG tablet Take 2 mg by mouth 2 (two) times daily.     Marland Kitchen guaiFENesin (MUCINEX) 600 MG 12 hr tablet Take 1,200 mg by mouth daily with breakfast.     . losartan (COZAAR) 50 MG tablet Take 1 tablet (50 mg total) by mouth daily. (Patient taking differently: Take 50 mg by mouth daily with  breakfast. ) 90 tablet 0  . metFORMIN (GLUCOPHAGE) 1000 MG tablet Take 1,000 mg by mouth 2 (two) times daily with a meal.      . metoCLOPramide (REGLAN) 10 MG tablet Take 5 mg by mouth 3 (three) times daily before meals.     . Multiple Vitamins-Minerals (CENTRUM SILVER ADULT 50+) TABS Take 1 tablet by mouth daily with breakfast.    . omeprazole (PRILOSEC) 20 MG capsule Take 20 mg by mouth daily.    . OXYGEN 4L of oxygen at night and 3L of oxygen when walking long distances    . SPIRIVA RESPIMAT 2.5 MCG/ACT AERS USE 2 SPRAYS (INHALATIONS)  EVERY MORNING 12 g 3   No current facility-administered medications for this visit.     Past Medical History:  Diagnosis Date  . Asthma   . Atrial fibrillation ([place])   . CAD (coronary artery disease)    Prior PTCA  . COPD (chronic obstructive pulmonary disease) ([place])   . Diabetes mellitus   . GERD (gastroesophageal reflux disease)   . Hyperlipidemia   . Hypertension   . lung ca dx'd 01/2006  . Nephrolithiasis   . Osteopenia   . Pulmonary embolus ([place])   . Tibia/fibula fracture 07/2016   Left  . Tremor, essential     Past Surgical History:  Procedure Laterality Date  .  Angioplasty  Approx 1993  . CARDIAC CATHETERIZATION  Approximately 1993  . CHOLECYSTECTOMY    . 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 FIXATION KYPHOPLASTY    . LUNG CANCER SURGERY  01/2006   Small excision of left lung tissue; mesh with radioactive seeds (per pt report)  . ORIF TIBIA FRACTURE Left 08/22/2016   Procedure: OPEN REDUCTION INTERNAL FIXATION (ORIF) TIBIA FRACTURE;  Surgeon: Nicholes Stairs, MD;  Location: [facility];  Service: Orthopedics;  Laterality: Left;  . ROTATOR CUFF TEAR  2016  . Tibia/Fibula Repair Left 07/2016  . TONSILLECTOMY      Social History   Socioeconomic History  . Marital status: Married    Spouse name: Santiago Glad  . Number of children: 2  . Years of education: college  . Highest education level: Not on file  Occupational History     Employer: RETIRED    Comment: Retired from [employer]  Tobacco Use  . Smoking status: Current Every Day Smoker    Packs/day: 0.50    Years: 59.00    Pack years: 29.50    Types: Cigarettes  . Smokeless tobacco: Never Used  Substance and Sexual Activity  . Alcohol use: Yes    Alcohol/week: 1.0 standard drink    Types: 1 Glasses of wine per week    Comment: Rare  . Drug use: No  . Sexual activity: Never  Other Topics Concern  . Not on file  Social History Narrative   Patient lives at home with his wife Santiago Glad).   Retired.   Education- College    Right handed.   Caffeine- three cups of coffee daily.   Social Determinants of Health   Financial Resource Strain:   . Difficulty of Paying Living Expenses: Not on file  Food Insecurity:   . Worried About Charity fundraiser in the Last Year: Not on file  . Ran Out of Food in the Last Year: Not on file  Transportation Needs:   . Lack of Transportation (Medical): Not on file  . Lack of Transportation (Non-Medical): Not on file  Physical Activity:   . Days of Exercise per Week: Not on file  . Minutes of Exercise per Session: Not on file  Stress:   . Feeling of Stress : Not on file  Social Connections:   . Frequency of Communication with Friends and Family: Not on file  . Frequency of Social Gatherings with Friends and Family: Not on file  . Attends Religious Services: Not on file  . Active Member of Clubs or Organizations: Not on file  . Attends Archivist Meetings: Not on file  . Marital Status: Not on file  Intimate Partner Violence:   . Fear of Current or Ex-Partner: Not on file  . Emotionally Abused: Not on file  . Physically Abused: Not on file  . Sexually Abused: Not on file    Family History  Problem Relation Age of Onset  . Heart disease Mother   . Tremor Mother   . Dementia Mother   . Heart Problems Father     ROS: no fevers or chills, productive cough, hemoptysis, dysphasia, odynophagia, melena,  hematochezia, dysuria, hematuria, rash, seizure activity, orthopnea, PND, pedal edema, claudication. Remaining systems are negative.  Physical Exam: Well-developed well-nourished in no acute distress.  Skin is warm and dry.  HEENT is normal.  Neck is supple.  Chest is clear to auscultation with normal expansion.  Cardiovascular exam is irregular Abdominal exam nontender  or distended. No masses palpated. Extremities show no edema. neuro grossly intact  ECG-atrial fibrillation at a rate of 87, right bundle branch block.  Personally reviewed  A/P  1 coronary artery disease-patient denies chest pain.  Plan to continue medical therapy with statin.    2 hyperlipidemia-continue statin.  Check lipids and liver.  3 hypertension-patient's blood pressure is controlled.  Continue present medical regimen.  4 tobacco abuse-patient has previously been counseled on discontinuing.  5 persistent atrial fibrillation-patient was diagnosed with recurrent atrial fibrillation in April 2021.  We will continue with apixaban.  He declines cardioversion.  Heart rate is controlled and limited medications.  I will schedule a 24-hour Holter monitor to make sure the heart rate is controlled.  Repeat echocardiogram.  Check TSH, hemoglobin and renal function.  Kirk Ruths, MD

## 2020-07-01 ENCOUNTER — Other Ambulatory Visit: Payer: Self-pay

## 2020-07-01 ENCOUNTER — Other Ambulatory Visit: Payer: Medicare Other | Admitting: Orthotics

## 2020-07-04 ENCOUNTER — Ambulatory Visit (INDEPENDENT_AMBULATORY_CARE_PROVIDER_SITE_OTHER): Payer: Medicare Other | Admitting: Cardiology

## 2020-07-04 ENCOUNTER — Encounter: Payer: Self-pay | Admitting: Cardiology

## 2020-07-04 ENCOUNTER — Other Ambulatory Visit: Payer: Self-pay

## 2020-07-04 ENCOUNTER — Encounter: Payer: Self-pay | Admitting: *Deleted

## 2020-07-04 ENCOUNTER — Encounter: Payer: Self-pay | Admitting: Podiatry

## 2020-07-04 VITALS — BP 130/72 | HR 87 | Temp 97.0°F | Ht 67.0 in | Wt 220.0 lb

## 2020-07-04 DIAGNOSIS — E7800 Pure hypercholesterolemia, unspecified: Secondary | ICD-10-CM | POA: Diagnosis not present

## 2020-07-04 DIAGNOSIS — I4819 Other persistent atrial fibrillation: Secondary | ICD-10-CM

## 2020-07-04 DIAGNOSIS — I2510 Atherosclerotic heart disease of native coronary artery without angina pectoris: Secondary | ICD-10-CM | POA: Diagnosis not present

## 2020-07-04 DIAGNOSIS — I10 Essential (primary) hypertension: Secondary | ICD-10-CM | POA: Diagnosis not present

## 2020-07-04 NOTE — Patient Instructions (Signed)
Medication Instructions:  NO CHANGE *If you need a refill on your cardiac medications before your next appointment, please call your pharmacy*   Lab Work: Your physician recommends that you return for lab work [place]  If you have labs (blood work) drawn today and your tests are completely normal, you will receive your results only by: Marland Kitchen MyChart Message (if you have MyChart) OR . A paper copy in the mail If you have any lab test that is abnormal or we need to change your treatment, we will call you to review the results.   Testing/Procedures:  Your physician has requested that you have an echocardiogram. Echocardiography is a painless test that uses sound waves to create images of your heart. It provides your doctor with information about the size and shape of your heart and how well your heart's chambers and valves are working. This procedure takes approximately one hour. There are no restrictions for this procedure.[place] Instructions   Your physician has requested you wear your ZIO patch monitor___3____days.   This is a single patch monitor.  [employer] supplies one patch monitor per enrollment.  Additional stickers are not available.   Please do not apply patch if you will be having a Nuclear Stress Test, Echocardiogram, Cardiac CT, MRI, or Chest Xray during the time frame you would be wearing the monitor. The patch cannot be worn during these tests.  You cannot remove and re-apply the ZIO XT patch monitor.   Your ZIO patch monitor will be sent USPS Priority mail from [facility] directly to your home address. The monitor may also be mailed to a PO BOX if home delivery is not available.   It may take 3-5 days to receive your monitor after you have been enrolled.   Once you have received you monitor, please review enclosed instructions.  Your monitor has already been registered assigning a specific monitor serial # to you.    Applying the monitor   Shave hair from upper left chest.   Hold abrader disc by orange tab.  Rub abrader in 40 strokes over left upper chest as indicated in your monitor instructions.   Clean area with 4 enclosed alcohol pads .  Use all pads to assure are is cleaned thoroughly.  Let dry.   Apply patch as indicated in monitor instructions.  Patch will be place under collarbone on left side of chest with arrow pointing upward.   Rub patch adhesive wings for 2 minutes.Remove white label marked "1".  Remove white label marked "2".  Rub patch adhesive wings for 2 additional minutes.   While looking in a mirror, press and release button in center of patch.  A small green light will flash 3-4 times .  This will be your only indicator the monitor has been turned on.     Do not shower for the first 24 hours.  You may shower after the first 24 hours.   Press button if you feel a symptom. You will hear a small click.  Record Date, Time and Symptom in the Patient Log Book.   When you are ready to remove patch, follow instructions on last 2 pages of Patient Log Book.  Stick patch monitor onto last page of Patient Log Book.   Place Patient Log Book in [place] box.  Use locking tab on box and tape box closed securely.  The [employer] has [employer] on it.  Please place in mailbox as soon as possible.  Your physician should have your test results approximately 7 days after the monitor has been mailed back to [facility].   Call [place] at [phone] if you have questions regarding your ZIO XT patch monitor.  Call them immediately if you see an orange light blinking on your monitor.   If your monitor falls off in less than 4 days contact our Monitor department at [phone].  If your monitor becomes loose or falls off after 4 days call [employer] at [phone] for suggestions on securing your monitor.      Follow-Up: At [facility], you and your health needs  are our priority.  As part of our continuing mission to provide you with exceptional heart care, we have created designated Provider Care Teams.  These Care Teams include your primary Cardiologist (physician) and Advanced Practice Providers (APPs -  Physician Assistants and Nurse Practitioners) who all work together to provide you with the care you need, when you need it.  We recommend signing up for the patient portal called "MyChart".  Sign up information is provided on this After Visit Summary.  MyChart is used to connect with patients for Virtual Visits (Telemedicine).  Patients are able to view lab/test results, encounter notes, upcoming appointments, etc.  Non-urgent messages can be sent to your provider as well.   To learn more about what you can do with MyChart, go to [email or web address].    Your next appointment:   6 month(s)  The format for your next appointment:   In Person  Provider:   You may see Kirk Ruths, MD or one of the following Advanced Practice Providers on your designated Care Team:    Kerin Ransom, PA-C  [place], [place]  Coletta Memos, [place]

## 2020-07-04 NOTE — Progress Notes (Signed)
Patient ID: Paul [name], adult   DOB: [birth year], 79 y.o.   MRN: [id number] Patient enrolled for [employer] to ship a 3 day ZIO XT long term holter monitor to his home.

## 2020-07-08 DIAGNOSIS — E7800 Pure hypercholesterolemia, unspecified: Secondary | ICD-10-CM | POA: Diagnosis not present

## 2020-07-08 DIAGNOSIS — I4819 Other persistent atrial fibrillation: Secondary | ICD-10-CM | POA: Diagnosis not present

## 2020-07-09 LAB — CBC
Hematocrit: 40.6 % (ref 37.5–51.0)
Hemoglobin: 12.8 g/dL — ABNORMAL LOW (ref 13.0–17.7)
MCH: 26.7 pg (ref 26.6–33.0)
MCHC: 31.5 g/dL (ref 31.5–35.7)
MCV: 85 fL (ref 79–97)
Platelets: 227 10*3/uL (ref 150–450)
RBC: 4.8 x10E6/uL (ref 4.14–5.80)
RDW: 15.1 % (ref 11.6–15.4)
WBC: 7.2 10*3/uL (ref 3.4–10.8)

## 2020-07-09 LAB — LIPID PANEL
Chol/HDL Ratio: 1.7 ratio (ref 0.0–5.0)
Cholesterol, Total: 109 mg/dL (ref 100–199)
HDL: 64 mg/dL (ref >39)
LDL Chol Calc (NIH): 33 mg/dL (ref 0–99)
Triglycerides: 46 mg/dL (ref 0–149)
VLDL Cholesterol Cal: 12 mg/dL (ref 5–40)

## 2020-07-09 LAB — COMPREHENSIVE METABOLIC PANEL
ALT: 25 IU/L (ref 0–44)
AST: 24 IU/L (ref 0–40)
Albumin/Globulin Ratio: 2.1 (ref 1.2–2.2)
Albumin: 4.2 g/dL (ref 3.7–4.7)
Alkaline Phosphatase: 50 IU/L (ref 48–121)
BUN/Creatinine Ratio: 20 (ref 10–24)
BUN: 17 mg/dL (ref 8–27)
Bilirubin Total: 0.4 mg/dL (ref 0.0–1.2)
CO2: 22 mmol/L (ref 20–29)
Calcium: 9.8 mg/dL (ref 8.6–10.2)
Chloride: 105 mmol/L (ref 96–106)
Creatinine, Ser: 0.83 mg/dL (ref 0.76–1.27)
GFR calc Af Amer: 96 mL/min/{1.73_m2} (ref >59)
GFR calc non Af Amer: 83 mL/min/{1.73_m2} (ref >59)
Globulin, Total: 2 g/dL (ref 1.5–4.5)
Glucose: 126 mg/dL — ABNORMAL HIGH (ref 65–99)
Potassium: 4.7 mmol/L (ref 3.5–5.2)
Sodium: 142 mmol/L (ref 134–144)
Total Protein: 6.2 g/dL (ref 6.0–8.5)

## 2020-07-09 LAB — TSH: TSH: 1.23 u[IU]/mL (ref 0.450–4.500)

## 2020-07-11 ENCOUNTER — Other Ambulatory Visit: Payer: Self-pay

## 2020-07-11 ENCOUNTER — Ambulatory Visit (HOSPITAL_COMMUNITY): Payer: Medicare Other | Attending: Cardiology

## 2020-07-11 DIAGNOSIS — I4819 Other persistent atrial fibrillation: Secondary | ICD-10-CM | POA: Diagnosis not present

## 2020-07-11 LAB — ECHOCARDIOGRAM COMPLETE
Area-P 1/2: 3.21 cm2
S' Lateral: 2.6 cm

## 2020-07-12 ENCOUNTER — Other Ambulatory Visit (INDEPENDENT_AMBULATORY_CARE_PROVIDER_SITE_OTHER): Payer: Medicare Other

## 2020-07-12 DIAGNOSIS — I4819 Other persistent atrial fibrillation: Secondary | ICD-10-CM | POA: Diagnosis not present

## 2020-07-21 DIAGNOSIS — Z85118 Personal history of other malignant neoplasm of bronchus and lung: Secondary | ICD-10-CM | POA: Diagnosis not present

## 2020-07-21 DIAGNOSIS — E1342 Other specified diabetes mellitus with diabetic polyneuropathy: Secondary | ICD-10-CM | POA: Diagnosis not present

## 2020-07-21 DIAGNOSIS — K219 Gastro-esophageal reflux disease without esophagitis: Secondary | ICD-10-CM | POA: Diagnosis not present

## 2020-07-21 DIAGNOSIS — I10 Essential (primary) hypertension: Secondary | ICD-10-CM | POA: Diagnosis not present

## 2020-07-21 DIAGNOSIS — E1165 Type 2 diabetes mellitus with hyperglycemia: Secondary | ICD-10-CM | POA: Diagnosis not present

## 2020-07-21 DIAGNOSIS — M810 Age-related osteoporosis without current pathological fracture: Secondary | ICD-10-CM | POA: Diagnosis not present

## 2020-07-21 DIAGNOSIS — N1832 Chronic kidney disease, stage 3b: Secondary | ICD-10-CM | POA: Diagnosis not present

## 2020-07-21 DIAGNOSIS — E1143 Type 2 diabetes mellitus with diabetic autonomic (poly)neuropathy: Secondary | ICD-10-CM | POA: Diagnosis not present

## 2020-07-21 DIAGNOSIS — J449 Chronic obstructive pulmonary disease, unspecified: Secondary | ICD-10-CM | POA: Diagnosis not present

## 2020-07-21 DIAGNOSIS — E559 Vitamin D deficiency, unspecified: Secondary | ICD-10-CM | POA: Diagnosis not present

## 2020-07-21 DIAGNOSIS — Z0000 Encounter for general adult medical examination without abnormal findings: Secondary | ICD-10-CM | POA: Diagnosis not present

## 2020-07-21 DIAGNOSIS — E7800 Pure hypercholesterolemia, unspecified: Secondary | ICD-10-CM | POA: Diagnosis not present

## 2020-07-22 DIAGNOSIS — I4819 Other persistent atrial fibrillation: Secondary | ICD-10-CM | POA: Diagnosis not present

## 2020-07-25 ENCOUNTER — Ambulatory Visit: Payer: Medicare Other | Admitting: Podiatry

## 2020-07-25 ENCOUNTER — Telehealth: Payer: Self-pay | Admitting: Podiatry

## 2020-07-25 MED ORDER — CEFDINIR 300 MG PO CAPS: 300.0000 mg | ORAL_CAPSULE | Freq: Two times a day (BID) | ORAL | 0 refills | Status: AC

## 2020-07-25 NOTE — Addendum Note (Signed)
Addended bySherryle Lis, Margee Trentham R on: 07/25/2020 05:08 PM   Modules accepted: Orders

## 2020-07-25 NOTE — Telephone Encounter (Signed)
Patient called and is having recurrent pain, redness, and swelling at the site of the incurvated nail at last visit. Sent Rx for [facility] and advised we will examine at his appt on 9/20  Lanae Crumbly, [facility] 07/25/2020

## 2020-08-01 ENCOUNTER — Other Ambulatory Visit: Payer: Self-pay

## 2020-08-01 ENCOUNTER — Ambulatory Visit (INDEPENDENT_AMBULATORY_CARE_PROVIDER_SITE_OTHER): Payer: Medicare Other | Admitting: Podiatry

## 2020-08-01 ENCOUNTER — Encounter: Payer: Self-pay | Admitting: Podiatry

## 2020-08-01 DIAGNOSIS — L600 Ingrowing nail: Secondary | ICD-10-CM | POA: Diagnosis not present

## 2020-08-01 DIAGNOSIS — L03032 Cellulitis of left toe: Secondary | ICD-10-CM

## 2020-08-01 DIAGNOSIS — E1142 Type 2 diabetes mellitus with diabetic polyneuropathy: Secondary | ICD-10-CM

## 2020-08-04 ENCOUNTER — Ambulatory Visit (INDEPENDENT_AMBULATORY_CARE_PROVIDER_SITE_OTHER): Payer: Medicare Other | Admitting: Neurology

## 2020-08-04 ENCOUNTER — Encounter: Payer: Self-pay | Admitting: Neurology

## 2020-08-04 ENCOUNTER — Other Ambulatory Visit: Payer: Self-pay

## 2020-08-04 VITALS — BP 131/64 | HR 80 | Ht 67.0 in | Wt 221.0 lb

## 2020-08-04 DIAGNOSIS — R531 Weakness: Secondary | ICD-10-CM

## 2020-08-04 DIAGNOSIS — R269 Unspecified abnormalities of gait and mobility: Secondary | ICD-10-CM

## 2020-08-04 DIAGNOSIS — R202 Paresthesia of skin: Secondary | ICD-10-CM | POA: Diagnosis not present

## 2020-08-04 NOTE — Progress Notes (Signed)
Subjective: Paul [name] presents today for follow up of ulceration to the left plantar midfoot.  He has received his diabetic shoes/inserts and his left foot insert has been offloaded more by Pedorthist.  He states his left hallux lateral border did turn colors and he thinks he had an ingrown toenail. Dr. Sherryle Lis called in [place] for him and he has 1 capsule left to take.  Past Medical History:  Diagnosis Date  . Asthma   . Atrial fibrillation ([place])   . CAD (coronary artery disease)    Prior PTCA  . COPD (chronic obstructive pulmonary disease) ([place])   . Diabetes mellitus   . GERD (gastroesophageal reflux disease)   . Hard of hearing   . Hyperlipidemia   . Hypertension   . lung ca dx'd 01/2006  . Nephrolithiasis   . Neuropathy   . Osteopenia   . Pulmonary embolus ([place])   . Tibia/fibula fracture 07/2016   Left  . Tremor, essential     Patient Active Problem List   Diagnosis Date Noted  . Gait abnormality 08/04/2020  . Paresthesia 08/04/2020  . Weakness 08/04/2020  . Chronic maxillary sinusitis 08/07/2018  . Enchondroma 07/29/2018  . Rhinitis, chronic 05/02/2017  . Peripheral edema 05/01/2017  . Ankle fracture, left 08/24/2016  . Closed fracture of proximal end of left tibia 08/22/2016  . Tibial fracture 08/09/2016  . Tibia fracture 08/09/2016  . COPD (chronic obstructive pulmonary disease) ([place])   . Chronic respiratory failure with hypoxia ([place]) 09/11/2015  . Morbid obesity due to excess calories ([place]) Complicated by dm, hbp/ hyperlipidemia 09/11/2015  . Chronic respiratory failure ([place]) 03/07/2015  . CAFL (chronic airflow limitation) ([place]) 10/27/2014  . Diabetes mellitus, type 2 ([place]) 10/27/2014  . Diabetes mellitus type 2, uncontrolled ([place]) 10/27/2014  . Acid reflux 10/27/2014  . Benign essential HTN 10/27/2014  . History of primary bronchial cancer 10/27/2014  . Extreme obesity 10/27/2014  . OP (osteoporosis) 10/27/2014  . Peripheral neuropathy ([place])  10/27/2014  . Hypercholesterolemia without hypertriglyceridemia 10/27/2014  . Compulsive tobacco user syndrome 10/27/2014  . Cough 06/21/2014  . Bronchitis 06/10/2014  . CAD (coronary artery disease) 04/08/2012  . Hyperlipidemia 04/08/2012  . Hypertension 04/08/2012  . Cigarette smoker 04/08/2012  . AF (paroxysmal atrial fibrillation) ([place]) 03/27/2012  . Exercise hypoxemia 03/25/2012  . COPD GOLD II/ still smoking  03/25/2012  . DM (diabetes mellitus), type 2, uncontrolled with complications ([place]) 44/96/7591  . Malignant neoplasm of bronchus and lung, unspecified site 10/11/2011    Past Surgical History:  Procedure Laterality Date  . Angioplasty  Approx 1993  . CARDIAC CATHETERIZATION  Approximately 1993  . CHOLECYSTECTOMY    . 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 FIXATION KYPHOPLASTY    . LUNG CANCER SURGERY  01/2006   Small excision of left lung tissue; mesh with radioactive seeds (per pt report)  . ORIF TIBIA FRACTURE Left 08/22/2016   Procedure: OPEN REDUCTION INTERNAL FIXATION (ORIF) TIBIA FRACTURE;  Surgeon: Nicholes Stairs, MD;  Location: [facility];  Service: Orthopedics;  Laterality: Left;  . ROTATOR CUFF TEAR  2016  . Tibia/Fibula Repair Left 07/2016  . TONSILLECTOMY      Current Outpatient Medications on File Prior to Visit  Medication Sig Dispense Refill  . acetaminophen (TYLENOL) 500 MG tablet Take 1,000 mg by mouth daily.     Marland Kitchen albuterol (PROAIR HFA) 108 (90 BASE) MCG/ACT inhaler Inhale 2 puffs into the lungs every 2 (two) hours as needed for wheezing or shortness  of breath.     Marland Kitchen apixaban (ELIQUIS) 5 MG TABS tablet Take 1 tablet (5 mg total) by mouth 2 (two) times daily. 180 tablet 3  . atorvastatin (LIPITOR) 40 MG tablet TAKE 1 TABLET BY MOUTH AT  BEDTIME 90 tablet 0  . budesonide-formoterol (SYMBICORT) 160-4.5 MCG/ACT inhaler TAKE 2 PUFFS FIRST THING IN THE MORNING AND THEN  ANOTHER 2 PUFFS ABOUT 12  HOURS LATER. 30.6 g 3  . calcium carbonate (OS-CAL) 600 MG  TABS tablet Take by mouth.    . fluticasone (FLONASE) 50 MCG/ACT nasal spray Place 1 spray into the nose 2 (two) times daily.     . furosemide (LASIX) 20 MG tablet TAKE 1 TABLET BY MOUTH  DAILY 90 tablet 0  . gabapentin (NEURONTIN) 300 MG capsule 4 at bedtime    . glimepiride (AMARYL) 2 MG tablet Take 2 mg by mouth 2 (two) times daily.     Marland Kitchen guaiFENesin (MUCINEX) 600 MG 12 hr tablet Take 1,200 mg by mouth daily with breakfast.     . losartan (COZAAR) 50 MG tablet Take 1 tablet (50 mg total) by mouth daily. (Patient taking differently: Take 50 mg by mouth daily with breakfast. ) 90 tablet 0  . metFORMIN (GLUCOPHAGE) 1000 MG tablet Take 1,000 mg by mouth 2 (two) times daily with a meal.      . metoCLOPramide (REGLAN) 10 MG tablet Take 5 mg by mouth 3 (three) times daily before meals.     . Multiple Vitamins-Minerals (CENTRUM SILVER ADULT 50+) TABS Take 1 tablet by mouth daily with breakfast.    . omeprazole (PRILOSEC) 20 MG capsule Take 20 mg by mouth daily.    . OXYGEN 4L of oxygen at night and 3L of oxygen when walking long distances    . SPIRIVA RESPIMAT 2.5 MCG/ACT AERS USE 2 SPRAYS (INHALATIONS)  EVERY MORNING 12 g 3   No current facility-administered medications on file prior to visit.     Allergies  Allergen Reactions  . Codeine Anaphylaxis  . Phenobarbital Hypertension    Social History   Occupational History    Employer: RETIRED    Comment: Retired from [employer]  Tobacco Use  . Smoking status: Current Every Day Smoker    Packs/day: 0.50    Years: 59.00    Pack years: 29.50    Types: Cigarettes  . Smokeless tobacco: Never Used  Substance and Sexual Activity  . Alcohol use: Not Currently    Alcohol/week: 1.0 standard drink    Types: 1 Glasses of wine per week  . Drug use: Never  . Sexual activity: Never    Family History  Problem Relation Age of Onset  . Heart disease Mother   . Tremor Mother   . Dementia Mother   . Heart Problems Father     Immunization  History  Administered Date(s) Administered  . Influenza Split 09/12/2012, 08/12/2013  . PFIZER SARS-COV-2 Vaccination 12/18/2019, 01/12/2020  . Pneumococcal Conjugate-13 11/12/2013  . Pneumococcal Polysaccharide-23 11/12/2010     Objective: There were no vitals filed for this visit.  Paul [name] is an 79 y.o. adult male, morbidly obese, in NAD. AAO X 3.  Vascular Examination: Capillary fill time to digits <3 seconds b/l. Palpable DP pulses b/l. Palpable PT pulses b/l. Pedal hair absent b/l Skin temperature gradient within normal limits b/l. No edema noted b/l. No pain with calf compression b/l.  Dermatological Examination: Pedal skin with normal turgor, texture and tone bilaterally. No open wounds  bilaterally. No interdigital macerations bilaterally.   Toenails 1-5 b/l recently debrided.   He has hyperkeratotic lesion with subdermal hemorrhage noted sub talonavicular joint left foot. There is no surrounding erythema, no edema, no drainage nor fluctuance evident. No abnormal warmth/coolness.    Incurvated nailplate left great toe lateral border(s) with retained nail spicule. No erythema, no edema, no drainage, no fluctuance.  Musculoskeletal Examination: Normal muscle strength 5/5 to all lower extremity muscle groups bilaterally. No pain crepitus or joint limitation noted with ROM b/l. Pes planus deformity noted b/l.  Plantarmedial midfoot collapse b/l.   Neurological Examination: Protective sensation diminished with 10g monofilament b/l. Vibratory sensation decreased b/l.  Assessment: 1. Paronychia of great toe, left   2. Ingrown nail of great toe of left foot   3. Diabetic peripheral neuropathy associated with type 2 diabetes mellitus ([place])     Plan: -Examined patient. -Continue diabetic foot care principles. Literature dispensed on today.  -Curretaged lateral border left hallux. Border cleansed with alcohol. Triple antibiotic ointment applied. He is to continue his  Omnicef.  He is to continue to apply Neosporin to digit once daily. -Preulcerative callus pared without incident left foot. No underlying ulceration. -Patient to continue soft, supportive shoe gear daily. -Patient to report any pedal injuries to medical professional immediately. -Patient/POA to call should there be question/concern in the interim.  Return in about 5 weeks (around 09/05/2020) for diabetic corn(s)/callus(es).

## 2020-08-04 NOTE — Progress Notes (Signed)
Chief Complaint  Patient presents with  . New Patient (Initial Visit)    He is here with his wife, Paul [name]. Reports burning pain in his bilateral legs, worse on left side. Both his feet are numb. He uses a rolling walker to assist with ambulation.   Marland Kitchen PCP    Paul Jordan, MD    HISTORICAL  Paul [name] is a 79 year old male, seen in request by his primary care physician Dr. Stephanie [name], Paul [name] for evaluation of bilateral lower extremity paresthesia, is accompanied by his wife Paul [name] at today's visit on August 04, 2020.  I reviewed and summarized the referring note.  Past medical history Diabetes Lung cancer in 2007, s/p left lobectomy, radial active chips. 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 Atrial fibrillation, on eqliuis  He has a long history of diabetic peripheral neuropathy, and bilateral feet paresthesia, wearing special diabetic shoes, he also reported a history of fall injury around 2017, broke his left leg, requiring surgery, also had a history of lumbar compression fracture in the past, had a kyphoplasty procedure  At baseline, he uses home oxygen for lumbar ambulance, rely on his walker, denies significant neck or low back pain, has urinary urgency, frequency, occasional incontinence  Early September 2021, without clear triggers, he noticed worsening left leg pain, traveling alone left hip, to left thigh, left anterior and posterior leg, left foot felt number, he also have a long history of intermittent bilateral finger paresthesia,   Laboratory evaluation in August 2021, normal TSH, CBC, hemoglobin of 13.6, CMP creatinine of 0.83, LDL 33, vitamin D was 45, A1c was 6.7,   REVIEW OF SYSTEMS: Full 14 system review of systems performed and notable only for as above All other review of systems were negative.  ALLERGIES: Allergies  Allergen Reactions  . Codeine Anaphylaxis  . Phenobarbital Hypertension    HOME  MEDICATIONS: Current Outpatient Medications  Medication Sig Dispense Refill  . acetaminophen (TYLENOL) 500 MG tablet Take 1,000 mg by mouth daily.     Marland Kitchen albuterol (PROAIR HFA) 108 (90 BASE) MCG/ACT inhaler Inhale 2 puffs into the lungs every 2 (two) hours as needed for wheezing or shortness of breath.     Marland Kitchen apixaban (ELIQUIS) 5 MG TABS tablet Take 1 tablet (5 mg total) by mouth 2 (two) times daily. 180 tablet 3  . atorvastatin (LIPITOR) 40 MG tablet TAKE 1 TABLET BY MOUTH AT  BEDTIME 90 tablet 0  . budesonide-formoterol (SYMBICORT) 160-4.5 MCG/ACT inhaler TAKE 2 PUFFS FIRST THING IN THE MORNING AND THEN  ANOTHER 2 PUFFS ABOUT 12  HOURS LATER. 30.6 g 3  . calcium carbonate (OS-CAL) 600 MG TABS tablet Take by mouth.    . cetirizine (ZYRTEC) 10 MG tablet Take 10 mg by mouth daily.    . fluticasone (FLONASE) 50 MCG/ACT nasal spray Place 1 spray into the nose 2 (two) times daily.     . furosemide (LASIX) 20 MG tablet TAKE 1 TABLET BY MOUTH  DAILY 90 tablet 0  . gabapentin (NEURONTIN) 300 MG capsule 4 at bedtime    . glimepiride (AMARYL) 2 MG tablet Take 2 mg by mouth 2 (two) times daily.     Marland Kitchen guaiFENesin (MUCINEX) 600 MG 12 hr tablet Take 1,200 mg by mouth daily with breakfast.     . losartan (COZAAR) 50 MG tablet Take 1 tablet (50 mg total) by mouth daily. (Patient taking differently: Take 50 mg by mouth daily with breakfast. ) 90 tablet  0  . metFORMIN (GLUCOPHAGE) 1000 MG tablet Take 1,000 mg by mouth 2 (two) times daily with a meal.      . metoCLOPramide (REGLAN) 10 MG tablet Take 5 mg by mouth 3 (three) times daily before meals.     . Multiple Vitamins-Minerals (CENTRUM SILVER ADULT 50+) TABS Take 1 tablet by mouth daily with breakfast.    . omeprazole (PRILOSEC) 20 MG capsule Take 20 mg by mouth daily.    . OXYGEN 4L of oxygen at night and 3L of oxygen when walking long distances    . SPIRIVA RESPIMAT 2.5 MCG/ACT AERS USE 2 SPRAYS (INHALATIONS)  EVERY MORNING 12 g 3  . vitamin C (ASCORBIC  ACID) 250 MG tablet Take 250 mg by mouth daily.     No current facility-administered medications for this visit.    PAST MEDICAL HISTORY: Past Medical History:  Diagnosis Date  . Asthma   . Atrial fibrillation ([place])   . CAD (coronary artery disease)    Prior PTCA  . COPD (chronic obstructive pulmonary disease) ([place])   . Diabetes mellitus   . GERD (gastroesophageal reflux disease)   . Hard of hearing   . Hyperlipidemia   . Hypertension   . lung ca dx'd 01/2006  . Nephrolithiasis   . Neuropathy   . Osteopenia   . Pulmonary embolus ([place])   . Tibia/fibula fracture 07/2016   Left  . Tremor, essential     PAST SURGICAL HISTORY: Past Surgical History:  Procedure Laterality Date  . Angioplasty  Approx 1993  . CARDIAC CATHETERIZATION  Approximately 1993  . CHOLECYSTECTOMY    . 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 FIXATION KYPHOPLASTY    . LUNG CANCER SURGERY  01/2006   Small excision of left lung tissue; mesh with radioactive seeds (per pt report)  . ORIF TIBIA FRACTURE Left 08/22/2016   Procedure: OPEN REDUCTION INTERNAL FIXATION (ORIF) TIBIA FRACTURE;  Surgeon: Paul Stairs, MD;  Location: [facility];  Service: Orthopedics;  Laterality: Left;  . ROTATOR CUFF TEAR  2016  . Tibia/Fibula Repair Left 07/2016  . TONSILLECTOMY      FAMILY HISTORY: Family History  Problem Relation Age of Onset  . Heart disease Mother   . Tremor Mother   . Dementia Mother   . Heart Problems Father     SOCIAL HISTORY: Social History   Socioeconomic History  . Marital status: Married    Spouse name: Paul [name]  . Number of children: 2  . Years of education: college  . Highest education level: Not on file  Occupational History    Employer: RETIRED    Comment: Retired from [employer]  Tobacco Use  . Smoking status: Current Every Day Smoker    Packs/day: 0.50    Years: 59.00    Pack years: 29.50    Types: Cigarettes  . Smokeless tobacco: Never Used  Substance and Sexual Activity  .  Alcohol use: Not Currently    Alcohol/week: 1.0 standard drink    Types: 1 Glasses of wine per week  . Drug use: Never  . Sexual activity: Never  Other Topics Concern  . Not on file  Social History Narrative   Patient lives at home with his wife Paul [name]).   Retired.   Education- College    Right handed.   Caffeine- three cups of coffee daily.   Social Determinants of Health   Financial Resource Strain:   . Difficulty of Paying Living Expenses: Not on file  Food Insecurity:   . Worried About Charity fundraiser in the Last Year: Not on file  . Ran Out of Food in the Last Year: Not on file  Transportation Needs:   . Lack of Transportation (Medical): Not on file  . Lack of Transportation (Non-Medical): Not on file  Physical Activity:   . Days of Exercise per Week: Not on file  . Minutes of Exercise per Session: Not on file  Stress:   . Feeling of Stress : Not on file  Social Connections:   . Frequency of Communication with Friends and Family: Not on file  . Frequency of Social Gatherings with Friends and Family: Not on file  . Attends Religious Services: Not on file  . Active Member of Clubs or Organizations: Not on file  . Attends Archivist Meetings: Not on file  . Marital Status: Not on file  Intimate Partner Violence:   . Fear of Current or Ex-Partner: Not on file  . Emotionally Abused: Not on file  . Physically Abused: Not on file  . Sexually Abused: Not on file     PHYSICAL EXAM   Vitals:   08/04/20 1355  BP: 131/64  Pulse: 80  Weight: 221 lb (100.2 kg)  Height: _0  (1.702 m)   Not recorded     Body mass index is 34.61 kg/m.  PHYSICAL EXAMNIATION:  Gen: NAD, conversant, well nourised, well groomed                     Cardiovascular: Regular rate rhythm, no peripheral edema, warm, nontender. Eyes: Conjunctivae clear without exudates or hemorrhage Neck: Supple, no carotid bruits. Pulmonary: Clear to auscultation bilaterally, wearing nasal  oxygen  NEUROLOGICAL EXAM:  MENTAL STATUS: Shortness of breath with minimum exertion Speech:    Speech is normal; fluent and spontaneous with normal comprehension.  Cognition:     Orientation to time, place and person     Normal recent and remote memory     Normal Attention span and concentration     Normal Language, naming, repeating,spontaneous speech     Fund of knowledge   CRANIAL NERVES: CN II: Visual fields are full to confrontation. Pupils are round equal and briskly reactive to light. CN III, IV, VI: extraocular movement are normal. No ptosis. CN V: Facial sensation is intact to light touch CN VII: Face is symmetric with normal eye closure  CN VIII: Hearing is normal to causal conversation. CN IX, X: Phonation is normal. CN XI: Head turning and shoulder shrug are intact  MOTOR: Moderate bilateral intrinsic hand muscle atrophy, no upper extremity proximal muscle weakness, moderate bilateral finger abduction, grip weakness.  No lower extremity proximal muscle weakness.  Moderate bilateral ankle dorsiflexion, plantarflexion weakness, left worse than right.  REFLEXES: Areflexia  SENSORY: Length dependent decreased light touch, pinprick to bilateral knee level  COORDINATION: There is no trunk or limb dysmetria noted.  GAIT/STANCE: He needs walker to get up from seated position, bilateral foot drop, rely on his walker, unsteady   DIAGNOSTIC DATA (LABS, IMAGING, TESTING) - I reviewed patient records, labs, notes, testing and imaging myself where available.   ASSESSMENT AND PLAN  MORRISON MCBRYAR is a 79 y.o. adult   New onset left hip pain, radiating pain to left lower extremity, History of lumbar compression fracture, status post kyphoplasty History of left tibial fracture, required surgery  On examinations, length dependent sensory changes, bilateral hand muscle moderate atrophy, weakness, moderate bilateral distal  leg muscle weakness, left worse than  right.  Differentiation diagnosis include cervical radiculopathy/spondylitic myelopathy, lumbar radiculopathy, with superimposed diabetic peripheral neuropathy  EMG nerve conduction study  MRI of cervical, lumbar spine  He is on gabapentin 300 mg 4 tablets at bedtime, which help his symptoms,help him sleep   Marcial Pacas, M.D. Ph.D.  [facility] [address], [place], [place] [address] Ph: [phone] Fax: [phone]  CC:  Paul Jordan, MD [address] [place],  [place] [address]

## 2020-08-08 ENCOUNTER — Ambulatory Visit (INDEPENDENT_AMBULATORY_CARE_PROVIDER_SITE_OTHER): Payer: Medicare Other | Admitting: Neurology

## 2020-08-08 ENCOUNTER — Other Ambulatory Visit: Payer: Self-pay

## 2020-08-08 ENCOUNTER — Telehealth: Payer: Self-pay | Admitting: Neurology

## 2020-08-08 ENCOUNTER — Encounter: Payer: Self-pay | Admitting: *Deleted

## 2020-08-08 DIAGNOSIS — R269 Unspecified abnormalities of gait and mobility: Secondary | ICD-10-CM

## 2020-08-08 DIAGNOSIS — R202 Paresthesia of skin: Secondary | ICD-10-CM

## 2020-08-08 DIAGNOSIS — I2510 Atherosclerotic heart disease of native coronary artery without angina pectoris: Secondary | ICD-10-CM

## 2020-08-08 DIAGNOSIS — R531 Weakness: Secondary | ICD-10-CM | POA: Diagnosis not present

## 2020-08-08 DIAGNOSIS — Z0289 Encounter for other administrative examinations: Secondary | ICD-10-CM

## 2020-08-08 DIAGNOSIS — G6289 Other specified polyneuropathies: Secondary | ICD-10-CM | POA: Diagnosis not present

## 2020-08-08 NOTE — Progress Notes (Signed)
HISTORICAL  Paul [name] is a 79 year old male, seen in request by his primary care physician Dr. Stephanie [name], Paul [name] for evaluation of bilateral lower extremity paresthesia, is accompanied by his wife Paul [name] at today's visit on August 04, 2020.  I reviewed and summarized the referring note.  Past medical history Diabetes Lung cancer in 2007, s/p left lobectomy, radial active chips. 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 Atrial fibrillation, on eqliuis  He has a long history of diabetic peripheral neuropathy, and bilateral feet paresthesia, wearing special diabetic shoes, he also reported a history of fall injury around 2017, broke his left leg, requiring surgery, also had a history of lumbar compression fracture in the past, had a kyphoplasty procedure  At baseline, he uses home oxygen for lumbar ambulance, rely on his walker, denies significant neck or low back pain, has urinary urgency, frequency, occasional incontinence  Early September 2021, without clear triggers, he noticed worsening left leg pain, traveling alone left hip, to left thigh, left anterior and posterior leg, left foot felt number, he also have a long history of intermittent bilateral finger paresthesia,   Laboratory evaluation in August 2021, normal TSH, CBC, hemoglobin of 13.6, CMP creatinine of 0.83, LDL 33, vitamin D was 45, A1c was 6.7,  UPDATE Sept 27 2021:    REVIEW OF SYSTEMS: Full 14 system review of systems performed and notable only for as above All other review of systems were negative.  ALLERGIES: Allergies  Allergen Reactions  . Codeine Anaphylaxis  . Phenobarbital Hypertension    HOME MEDICATIONS: Current Outpatient Medications  Medication Sig Dispense Refill  . acetaminophen (TYLENOL) 500 MG tablet Take 1,000 mg by mouth daily.     Marland Kitchen albuterol (PROAIR HFA) 108 (90 BASE) MCG/ACT inhaler Inhale 2 puffs into the lungs every 2 (two) hours as  needed for wheezing or shortness of breath.     Marland Kitchen apixaban (ELIQUIS) 5 MG TABS tablet Take 1 tablet (5 mg total) by mouth 2 (two) times daily. 180 tablet 3  . atorvastatin (LIPITOR) 40 MG tablet TAKE 1 TABLET BY MOUTH AT  BEDTIME 90 tablet 0  . budesonide-formoterol (SYMBICORT) 160-4.5 MCG/ACT inhaler TAKE 2 PUFFS FIRST THING IN THE MORNING AND THEN  ANOTHER 2 PUFFS ABOUT 12  HOURS LATER. 30.6 g 3  . calcium carbonate (OS-CAL) 600 MG TABS tablet Take by mouth.    . cetirizine (ZYRTEC) 10 MG tablet Take 10 mg by mouth daily.    . fluticasone (FLONASE) 50 MCG/ACT nasal spray Place 1 spray into the nose 2 (two) times daily.     . furosemide (LASIX) 20 MG tablet TAKE 1 TABLET BY MOUTH  DAILY 90 tablet 0  . gabapentin (NEURONTIN) 300 MG capsule 4 at bedtime    . glimepiride (AMARYL) 2 MG tablet Take 2 mg by mouth 2 (two) times daily.     Marland Kitchen guaiFENesin (MUCINEX) 600 MG 12 hr tablet Take 1,200 mg by mouth daily with breakfast.     . losartan (COZAAR) 50 MG tablet Take 1 tablet (50 mg total) by mouth daily. (Patient taking differently: Take 50 mg by mouth daily with breakfast. ) 90 tablet 0  . metFORMIN (GLUCOPHAGE) 1000 MG tablet Take 1,000 mg by mouth 2 (two) times daily with a meal.      . metoCLOPramide (REGLAN) 10 MG tablet Take 5 mg by mouth 3 (three) times daily before meals.     . Multiple Vitamins-Minerals (CENTRUM SILVER ADULT 50+) TABS  Take 1 tablet by mouth daily with breakfast.    . omeprazole (PRILOSEC) 20 MG capsule Take 20 mg by mouth daily.    . OXYGEN 4L of oxygen at night and 3L of oxygen when walking long distances    . SPIRIVA RESPIMAT 2.5 MCG/ACT AERS USE 2 SPRAYS (INHALATIONS)  EVERY MORNING 12 g 3  . vitamin C (ASCORBIC ACID) 250 MG tablet Take 250 mg by mouth daily.     No current facility-administered medications for this visit.    PAST MEDICAL HISTORY: Past Medical History:  Diagnosis Date  . Asthma   . Atrial fibrillation ([place])   . CAD (coronary artery disease)     Prior PTCA  . COPD (chronic obstructive pulmonary disease) ([place])   . Diabetes mellitus   . GERD (gastroesophageal reflux disease)   . Hard of hearing   . Hyperlipidemia   . Hypertension   . lung ca dx'd 01/2006  . Nephrolithiasis   . Neuropathy   . Osteopenia   . Pulmonary embolus ([place])   . Tibia/fibula fracture 07/2016   Left  . Tremor, essential     PAST SURGICAL HISTORY: Past Surgical History:  Procedure Laterality Date  . Angioplasty  Approx 1993  . CARDIAC CATHETERIZATION  Approximately 1993  . CHOLECYSTECTOMY    . 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 FIXATION KYPHOPLASTY    . LUNG CANCER SURGERY  01/2006   Small excision of left lung tissue; mesh with radioactive seeds (per pt report)  . ORIF TIBIA FRACTURE Left 08/22/2016   Procedure: OPEN REDUCTION INTERNAL FIXATION (ORIF) TIBIA FRACTURE;  Surgeon: Nicholes Stairs, MD;  Location: [facility];  Service: Orthopedics;  Laterality: Left;  . ROTATOR CUFF TEAR  2016  . Tibia/Fibula Repair Left 07/2016  . TONSILLECTOMY      FAMILY HISTORY: Family History  Problem Relation Age of Onset  . Heart disease Mother   . Tremor Mother   . Dementia Mother   . Heart Problems Father     SOCIAL HISTORY: Social History   Socioeconomic History  . Marital status: Married    Spouse name: Santiago Glad  . Number of children: 2  . Years of education: college  . Highest education level: Not on file  Occupational History    Employer: RETIRED    Comment: Retired from [employer]  Tobacco Use  . Smoking status: Current Every Day Smoker    Packs/day: 0.50    Years: 59.00    Pack years: 29.50    Types: Cigarettes  . Smokeless tobacco: Never Used  Substance and Sexual Activity  . Alcohol use: Not Currently    Alcohol/week: 1.0 standard drink    Types: 1 Glasses of wine per week  . Drug use: Never  . Sexual activity: Never  Other Topics Concern  . Not on file  Social History Narrative   Patient lives at home with his wife Santiago Glad).    Retired.   Education- College    Right handed.   Caffeine- three cups of coffee daily.   Social Determinants of Health   Financial Resource Strain:   . Difficulty of Paying Living Expenses: Not on file  Food Insecurity:   . Worried About Charity fundraiser in the Last Year: Not on file  . Ran Out of Food in the Last Year: Not on file  Transportation Needs:   . Lack of Transportation (Medical): Not on file  . Lack of Transportation (Non-Medical): Not on file  Physical Activity:   . Days of Exercise per Week: Not on file  . Minutes of Exercise per Session: Not on file  Stress:   . Feeling of Stress : Not on file  Social Connections:   . Frequency of Communication with Friends and Family: Not on file  . Frequency of Social Gatherings with Friends and Family: Not on file  . Attends Religious Services: Not on file  . Active Member of Clubs or Organizations: Not on file  . Attends Archivist Meetings: Not on file  . Marital Status: Not on file  Intimate Partner Violence:   . Fear of Current or Ex-Partner: Not on file  . Emotionally Abused: Not on file  . Physically Abused: Not on file  . Sexually Abused: Not on file     PHYSICAL EXAM   There were no vitals filed for this visit. Not recorded     There is no height or weight on file to calculate BMI.  PHYSICAL EXAMNIATION:  Gen: NAD, conversant, well nourised, well groomed                     Cardiovascular: Regular rate rhythm, no peripheral edema, warm, nontender. Eyes: Conjunctivae clear without exudates or hemorrhage Neck: Supple, no carotid bruits. Pulmonary: Clear to auscultation bilaterally, wearing nasal oxygen  NEUROLOGICAL EXAM:  MENTAL STATUS: Shortness of breath with minimum exertion Speech:    Speech is normal; fluent and spontaneous with normal comprehension.  Cognition:     Orientation to time, place and person     Normal recent and remote memory     Normal Attention span and  concentration     Normal Language, naming, repeating,spontaneous speech     Fund of knowledge   CRANIAL NERVES: CN II: Visual fields are full to confrontation. Pupils are round equal and briskly reactive to light. CN III, IV, VI: extraocular movement are normal. No ptosis. CN V: Facial sensation is intact to light touch CN VII: Face is symmetric with normal eye closure  CN VIII: Hearing is normal to causal conversation. CN IX, X: Phonation is normal. CN XI: Head turning and shoulder shrug are intact  MOTOR: Moderate bilateral intrinsic hand muscle atrophy, no upper extremity proximal muscle weakness, moderate bilateral finger abduction, grip weakness.  No lower extremity proximal muscle weakness.  Moderate bilateral ankle dorsiflexion, plantarflexion weakness, left worse than right.  REFLEXES: Areflexia  SENSORY: Length dependent decreased light touch, pinprick to bilateral knee level  COORDINATION: There is no trunk or limb dysmetria noted.  GAIT/STANCE: He needs walker to get up from seated position, bilateral foot drop, rely on his walker, unsteady   DIAGNOSTIC DATA (LABS, IMAGING, TESTING) - I reviewed patient records, labs, notes, testing and imaging myself where available.   ASSESSMENT AND PLAN  Paul [name] is a 79 y.o. adult   New onset left hip pain, radiating pain to left lower extremity, History of lumbar compression fracture, status post kyphoplasty History of left tibial fracture, required surgery  On examinations, length dependent sensory changes, bilateral hand muscle moderate atrophy, weakness, moderate bilateral distal leg muscle weakness, left worse than right.  Differentiation diagnosis include cervical radiculopathy/spondylitic myelopathy, lumbar radiculopathy, with superimposed diabetic peripheral neuropathy  EMG nerve conduction study  MRI of cervical, lumbar spine  He is on gabapentin 300 mg 4 tablets at bedtime, which help his symptoms,help him  sleep   Marcial Pacas, M.D. Ph.D.  [facility] [address] [place],  [place] [address] Ph: [phone] Fax: [phone]  CC:  Jonathon Jordan, MD [place],  [place] [address]

## 2020-08-08 NOTE — Telephone Encounter (Signed)
Medicare/bcbs supp order sent to [facility]. They will reach out to the patient to schedule.

## 2020-08-09 NOTE — Procedures (Addendum)
Full Name: Paul [name] Gender: Male MRN #: [id number] Date of Birth: 05/30/1941    Visit Date: 08/08/2020 08:55 Age: 79 Years Examining Physician: Marcial Pacas, MD  Referring Physician: Marcial Pacas, MD Height: 5 feet 7 inch History: 79 years old male presented with bilateral feet paresthesia, low back pain, gait abnormality,  Summary of the test: Nerve conduction study:  Bilateral sural, superficial peroneal, right ulnar sensory responses were absent.  Right median, radial sensory responses showed mildly prolonged peak latency, with significantly decreased snap amplitude.  Bilateral tibial, peroneal to EDB motor responses were absent. Right ulnar  motor responses showed significantly decreased CMAP amplitude, with significantly prolonged distal latency.  Right median motor responses showed significantly prolonged distal latency, with normal CMAP amplitude, mildly slow conduction velocity  Electromyography: Selected needle examination of bilateral lower extremity muscles, bilateral lumbosacral paraspinal muscles, right upper extremity muscles, and right cervical paraspinal muscles were performed.  There is evidence of chronic neuropathic changes involving bilateral lumbar sacral myotomes, bilateral L4, L5, S1 myotomes.  There is also evidence of increased insertional activity at bilateral lumbosacral paraspinal muscles.  There is evidence of chronic neuropathic changes involving right cervical myotomes, right C6, 7, C5.  There was no evidence of active neuropathic changes at cervical paraspinal muscles.   Conclusion: This is an abnormal study.  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  In addition, there is also evidence of chronic bilateral lumbosacral radiculopathy, involving bilateral L4, L5, S1  myotomes; and right cervical radiculopathy, involving right C5, 6 and 7 myotomes.    ------------------------------- Marcial Pacas, M.D. PhD  [facility] [address], [place] [address] Tel: [phone] Fax: [phone]  Verbal informed consent was obtained from the patient, patient was informed of potential risk of procedure, including bruising, bleeding, hematoma formation, infection, muscle weakness, muscle pain, numbness, among others.        Upper [place]    Nerve / Sites Muscle Latency Ref. Amplitude Ref. Rel Amp Segments Distance Velocity Ref. Area    ms ms mV mV %  cm m/s m/s mVms  R Median - APB     Wrist APB 5.5 ?4.4 5.2 ?4.0 100 Wrist - APB 7   20.2     Upper arm APB 11.3  4.7  89.8 Upper arm - Wrist 21 37 ?49 23.5  R Ulnar - ADM     Wrist ADM 6.3 ?3.3 1.2 ?6.0 100 Wrist - ADM 7   2.8     B.Elbow ADM 10.6  5.2  432 B.Elbow - Wrist 19 44 ?49 20.1     A.Elbow ADM 13.0  5.2  102 A.Elbow - B.Elbow 10 43 ?49 20.5         A.Elbow - Wrist      R Peroneal - EDB     Ankle EDB NR ?6.5 NR ?2.0 NR Ankle - EDB 9   NR     Fib head EDB NR  NR  NR Fib head - Ankle 30 NR ?44 NR     Pop fossa EDB NR  NR  NR Pop fossa - Fib head 10 NR ?44 NR         Pop fossa - Ankle      L Peroneal - EDB     Ankle EDB NR ?6.5 NR ?2.0 NR Ankle - EDB 9  NR     Fib head EDB NR  NR  NR Fib head - Ankle 28 NR ?44 NR     Pop fossa EDB NR  NR  NR Pop fossa - Fib head 10 NR ?44 NR         Pop fossa - Ankle      R Tibial - AH     Ankle AH NR ?5.8 NR ?4.0 NR Ankle - AH 9 NR  NR  L Tibial - AH     Ankle AH NR ?5.8 NR ?4.0 NR Ankle - AH 9 NR  NR                 SNC    Nerve / Sites Rec. Site Peak Lat Ref.  Amp Ref. Segments Distance    ms ms V V  cm  R Radial - Anatomical snuff box (Forearm)     Forearm Wrist 3.1 ?2.9 2 ?15 Forearm - Wrist 10  R Sural - Ankle (Calf)     Calf Ankle NR ?4.4 NR ?6 Calf - Ankle 14  L Sural - Ankle (Calf)     Calf Ankle NR ?4.4 NR ?6 Calf - Ankle 14  R  Superficial peroneal - Ankle     Lat leg Ankle NR ?4.4 NR ?6 Lat leg - Ankle 14  L Superficial peroneal - Ankle     Lat leg Ankle NR ?4.4 NR ?6 Lat leg - Ankle 14  R Median - Orthodromic (Dig II, Mid palm)     Dig II Wrist 4.5 ?3.4 3 ?10 Dig II - Wrist 13  R Ulnar - Orthodromic, (Dig V, Mid palm)     Dig V Wrist NR ?3.1 NR ?5 Dig V - Wrist 92                   F  Wave    Nerve F Lat Ref.   ms ms  R Ulnar - ADM 41.3 ?32.0       EMG Summary Table    Spontaneous MUAP Recruitment  Muscle IA Fib PSW Fasc Other Amp Dur. Poly Pattern  L. Tibialis anterior Increased 1+ None None _______ Normal Normal Normal Discrete  L. Tibialis posterior Increased None None None _______ Increased Increased Normal Reduced  L. Peroneus longus Increased 1+ None None _______ Normal Increased 1+ Reduced  L. Vastus lateralis Increased None None None _______ Increased Increased Normal Reduced  L. Biceps femoris (short head) Increased None None None _______ Increased Increased 1+ Reduced  L. Gluteus medius Increased None None None _______ Increased Increased 1+ Reduced  L. Lumbar paraspinals (low) Increased None None None _______ Normal Normal Normal Normal  L. Lumbar paraspinals (mid) Increased None None None _______ Normal Normal Normal Normal  R. Tibialis anterior Increased 1+ None None _______ Increased Increased 1+ Reduced  R. Tibialis posterior Increased 1+ None None _______ Increased Increased 1+ Reduced  R. Gastrocnemius (Medial head) Increased None None None _______ Increased Increased 1+ Reduced  R. Vastus lateralis Increased None None None _______ Increased Increased 1+ Reduced  R. Lumbar paraspinals (low) Increased None None None _______ Normal Normal Normal Normal  R. Lumbar paraspinals (mid) Increased None None None _______ Normal Normal Normal Normal  L. First dorsal interosseous Increased 1+ 1+ None _______ Increased Increased 1+ Reduced  L. Pronator teres Increased None None None _______ Increased  Increased 1+ Reduced  L. Biceps brachii Increased None None None _______ Increased Increased 1+ Reduced  L.  Deltoid Increased None None None _______ Normal Normal Normal Reduced  L. Triceps brachii Normal None None None _______ Normal Normal Normal Reduced  L. Cervical paraspinals Normal None None None _______ Normal Normal Normal Normal

## 2020-08-11 LAB — MULTIPLE MYELOMA PANEL, SERUM
Albumin SerPl Elph-Mcnc: 3.8 g/dL (ref 2.9–4.4)
Albumin/Glob SerPl: 1.6 (ref 0.7–1.7)
Alpha 1: 0.2 g/dL (ref 0.0–0.4)
Alpha2 Glob SerPl Elph-Mcnc: 0.8 g/dL (ref 0.4–1.0)
B-Globulin SerPl Elph-Mcnc: 0.8 g/dL (ref 0.7–1.3)
Gamma Glob SerPl Elph-Mcnc: 0.7 g/dL (ref 0.4–1.8)
Globulin, Total: 2.5 g/dL (ref 2.2–3.9)
IgA/Immunoglobulin A, Serum: 79 mg/dL (ref 61–437)
IgG (Immunoglobin G), Serum: 688 mg/dL (ref 603–1613)
IgM (Immunoglobulin M), Srm: 135 mg/dL (ref 15–143)
Total Protein: 6.3 g/dL (ref 6.0–8.5)

## 2020-08-11 LAB — VITAMIN B12: Vitamin B-12: 564 pg/mL (ref 232–1245)

## 2020-08-11 LAB — SEDIMENTATION RATE: Sed Rate: 2 mm/hr (ref 0–30)

## 2020-08-11 LAB — CK: Total CK: 68 U/L (ref 41–331)

## 2020-08-11 LAB — RPR: RPR Ser Ql: NONREACTIVE

## 2020-08-11 LAB — ANA W/REFLEX IF POSITIVE: Anti Nuclear Antibody (ANA): NEGATIVE

## 2020-08-11 LAB — FOLATE: Folate: >20 ng/mL (ref >3.0)

## 2020-08-11 LAB — C-REACTIVE PROTEIN: CRP: <1 mg/L (ref 0–10)

## 2020-08-11 NOTE — Progress Notes (Signed)
EMG report is under procedure

## 2020-08-26 ENCOUNTER — Ambulatory Visit (INDEPENDENT_AMBULATORY_CARE_PROVIDER_SITE_OTHER): Payer: Medicare Other | Admitting: Podiatry

## 2020-08-26 ENCOUNTER — Other Ambulatory Visit: Payer: Self-pay

## 2020-08-26 DIAGNOSIS — M2142 Flat foot [pes planus] (acquired), left foot: Secondary | ICD-10-CM

## 2020-08-26 DIAGNOSIS — B351 Tinea unguium: Secondary | ICD-10-CM | POA: Diagnosis not present

## 2020-08-26 DIAGNOSIS — M79674 Pain in right toe(s): Secondary | ICD-10-CM

## 2020-08-26 DIAGNOSIS — L84 Corns and callosities: Secondary | ICD-10-CM | POA: Diagnosis not present

## 2020-08-26 DIAGNOSIS — E1142 Type 2 diabetes mellitus with diabetic polyneuropathy: Secondary | ICD-10-CM

## 2020-08-26 DIAGNOSIS — M79675 Pain in left toe(s): Secondary | ICD-10-CM

## 2020-08-26 DIAGNOSIS — M2141 Flat foot [pes planus] (acquired), right foot: Secondary | ICD-10-CM

## 2020-08-27 DIAGNOSIS — Z23 Encounter for immunization: Secondary | ICD-10-CM | POA: Diagnosis not present

## 2020-08-28 ENCOUNTER — Ambulatory Visit
Admission: RE | Admit: 2020-08-28 | Discharge: 2020-08-28 | Disposition: A | Discharge status: Home / Self Care | Payer: Medicare Other | Source: Ambulatory Visit | Attending: Neurology | Admitting: Neurology

## 2020-08-28 ENCOUNTER — Other Ambulatory Visit: Payer: Self-pay

## 2020-08-28 DIAGNOSIS — R531 Weakness: Secondary | ICD-10-CM

## 2020-08-28 DIAGNOSIS — M48061 Spinal stenosis, lumbar region without neurogenic claudication: Secondary | ICD-10-CM | POA: Diagnosis not present

## 2020-08-28 DIAGNOSIS — M4802 Spinal stenosis, cervical region: Secondary | ICD-10-CM | POA: Diagnosis not present

## 2020-08-28 DIAGNOSIS — R269 Unspecified abnormalities of gait and mobility: Secondary | ICD-10-CM

## 2020-08-28 DIAGNOSIS — R202 Paresthesia of skin: Secondary | ICD-10-CM

## 2020-08-28 DIAGNOSIS — M545 Low back pain, unspecified: Secondary | ICD-10-CM | POA: Diagnosis not present

## 2020-08-30 ENCOUNTER — Encounter: Payer: Self-pay | Admitting: Podiatry

## 2020-08-30 NOTE — Telephone Encounter (Signed)
I spoke to the patient and provided him with Dr. Rhea Belton feedback below. He verbalized understanding. He has a pending appt on 10/11/20 for further evaluation. I offered to move it to an earlier date but he declined at this time.

## 2020-08-30 NOTE — Progress Notes (Signed)
Subjective: [name] presents today for for annual diabetic foot examination, at risk foot care with history of diabetic neuropathy and painful mycotic nails b/l that are difficult to trim. Pain interferes with ambulation. Aggravating factors include wearing enclosed shoe gear. Pain is relieved with periodic professional debridement.  Patient states he has a new spot on top of his right foot and he suspects something in his diabetic shoe may be rubbing his foot. He also informs me his Neurologist has ordered braces for him as he has some gait abnormality due to neuropathy.   He will also be getting a lumbar and cervical spine MRI.   Past Medical History:  Diagnosis Date  . Asthma   . Atrial fibrillation ([place])   . CAD (coronary artery disease)    Prior PTCA  . COPD (chronic obstructive pulmonary disease) ([place])   . Diabetes mellitus   . GERD (gastroesophageal reflux disease)   . Hard of hearing   . Hyperlipidemia   . Hypertension   . lung ca dx'd 01/2006  . Nephrolithiasis   . Neuropathy   . Osteopenia   . Pulmonary embolus ([place])   . Tibia/fibula fracture 07/2016   Left  . Tremor, essential     Patient Active Problem List   Diagnosis Date Noted  . Gait abnormality 08/04/2020  . Paresthesia 08/04/2020  . Weakness 08/04/2020  . Chronic maxillary sinusitis 08/07/2018  . Enchondroma 07/29/2018  . Rhinitis, chronic 05/02/2017  . Peripheral edema 05/01/2017  . Ankle fracture, left 08/24/2016  . Closed fracture of proximal end of left tibia 08/22/2016  . Tibial fracture 08/09/2016  . Tibia fracture 08/09/2016  . COPD (chronic obstructive pulmonary disease) ([place])   . Chronic respiratory failure with hypoxia ([place]) 09/11/2015  . Morbid obesity due to excess calories ([place]) Complicated by dm, hbp/ hyperlipidemia 09/11/2015  . Chronic respiratory failure ([place]) 03/07/2015  . CAFL (chronic airflow limitation) ([place]) 10/27/2014  . Diabetes mellitus, type 2 ([place]) 10/27/2014  .  Diabetes mellitus type 2, uncontrolled ([place]) 10/27/2014  . Acid reflux 10/27/2014  . Benign essential HTN 10/27/2014  . History of primary bronchial cancer 10/27/2014  . Extreme obesity 10/27/2014  . OP (osteoporosis) 10/27/2014  . Peripheral neuropathy ([place]) 10/27/2014  . Hypercholesterolemia without hypertriglyceridemia 10/27/2014  . Compulsive tobacco user syndrome 10/27/2014  . Cough 06/21/2014  . Bronchitis 06/10/2014  . CAD (coronary artery disease) 04/08/2012  . Hyperlipidemia 04/08/2012  . Hypertension 04/08/2012  . Cigarette smoker 04/08/2012  . AF (paroxysmal atrial fibrillation) ([place]) 03/27/2012  . Exercise hypoxemia 03/25/2012  . COPD GOLD II/ still smoking  03/25/2012  . DM (diabetes mellitus), type 2, uncontrolled with complications ([place]) 09/81/1914  . Malignant neoplasm of bronchus and lung, unspecified site 10/11/2011    Past Surgical History:  Procedure Laterality Date  . Angioplasty  Approx 1993  . CARDIAC CATHETERIZATION  Approximately 1993  . CHOLECYSTECTOMY    . 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 FIXATION KYPHOPLASTY    . LUNG CANCER SURGERY  01/2006   Small excision of left lung tissue; mesh with radioactive seeds (per pt report)  . ORIF TIBIA FRACTURE Left 08/22/2016   Procedure: OPEN REDUCTION INTERNAL FIXATION (ORIF) TIBIA FRACTURE;  Surgeon: Nicholes Stairs, MD;  Location: [facility];  Service: Orthopedics;  Laterality: Left;  . ROTATOR CUFF TEAR  2016  . Tibia/Fibula Repair Left 07/2016  . TONSILLECTOMY      Current Outpatient Medications on File Prior to Visit  Medication Sig Dispense Refill  .  acetaminophen (TYLENOL) 500 MG tablet Take 1,000 mg by mouth daily.     Marland Kitchen albuterol (PROAIR HFA) 108 (90 BASE) MCG/ACT inhaler Inhale 2 puffs into the lungs every 2 (two) hours as needed for wheezing or shortness of breath.     Marland Kitchen apixaban (ELIQUIS) 5 MG TABS tablet Take 1 tablet (5 mg total) by mouth 2 (two) times daily. 180 tablet 3  . atorvastatin  (LIPITOR) 40 MG tablet TAKE 1 TABLET BY MOUTH AT  BEDTIME 90 tablet 0  . budesonide-formoterol (SYMBICORT) 160-4.5 MCG/ACT inhaler TAKE 2 PUFFS FIRST THING IN THE MORNING AND THEN  ANOTHER 2 PUFFS ABOUT 12  HOURS LATER. 30.6 g 3  . calcium carbonate (OS-CAL) 600 MG TABS tablet Take by mouth.    . cetirizine (ZYRTEC) 10 MG tablet Take 10 mg by mouth daily.    . fluticasone (FLONASE) 50 MCG/ACT nasal spray Place 1 spray into the nose 2 (two) times daily.     . furosemide (LASIX) 20 MG tablet TAKE 1 TABLET BY MOUTH  DAILY 90 tablet 0  . gabapentin (NEURONTIN) 300 MG capsule 4 at bedtime    . glimepiride (AMARYL) 2 MG tablet Take 2 mg by mouth 2 (two) times daily.     Marland Kitchen guaiFENesin (MUCINEX) 600 MG 12 hr tablet Take 1,200 mg by mouth daily with breakfast.     . losartan (COZAAR) 50 MG tablet Take 1 tablet (50 mg total) by mouth daily. (Patient taking differently: Take 50 mg by mouth daily with breakfast. ) 90 tablet 0  . metFORMIN (GLUCOPHAGE) 1000 MG tablet Take 1,000 mg by mouth 2 (two) times daily with a meal.      . metoCLOPramide (REGLAN) 10 MG tablet Take 5 mg by mouth 3 (three) times daily before meals.     . Multiple Vitamins-Minerals (CENTRUM SILVER ADULT 50+) TABS Take 1 tablet by mouth daily with breakfast.    . omeprazole (PRILOSEC) 20 MG capsule Take 20 mg by mouth daily.    . OXYGEN 4L of oxygen at night and 3L of oxygen when walking long distances    . SPIRIVA RESPIMAT 2.5 MCG/ACT AERS USE 2 SPRAYS (INHALATIONS)  EVERY MORNING 12 g 3  . vitamin C (ASCORBIC ACID) 250 MG tablet Take 250 mg by mouth daily.     No current facility-administered medications on file prior to visit.     Allergies  Allergen Reactions  . Codeine Anaphylaxis  . Phenobarbital Hypertension    Social History   Occupational History    Employer: RETIRED    Comment: Retired from [employer]  Tobacco Use  . Smoking status: Current Every Day Smoker    Packs/day: 0.50    Years: 59.00    Pack years: 29.50     Types: Cigarettes  . Smokeless tobacco: Never Used  Substance and Sexual Activity  . Alcohol use: Not Currently    Alcohol/week: 1.0 standard drink    Types: 1 Glasses of wine per week  . Drug use: Never  . Sexual activity: Never    Family History  Problem Relation Age of Onset  . Heart disease Mother   . Tremor Mother   . Dementia Mother   . Heart Problems Father     Immunization History  Administered Date(s) Administered  . Influenza Split 09/12/2012, 08/12/2013  . PFIZER SARS-COV-2 Vaccination 12/18/2019, 01/12/2020  . Pneumococcal Conjugate-13 11/12/2013  . Pneumococcal Polysaccharide-23 11/12/2010     Objective: There were no vitals filed for this visit.  Paul [name] is an 79 y.o. adult male, morbidly obese, in NAD. AAO X 3.  Vascular Examination: Capillary fill time to digits <3 seconds b/l. Palpable DP pulses b/l. Palpable PT pulses b/l. Pedal hair absent b/l Skin temperature gradient within normal limits b/l. No edema noted b/l. No pain with calf compression b/l.  Dermatological Examination: Pedal skin with normal turgor, texture and tone bilaterally. No open wounds bilaterally. No interdigital macerations bilaterally. Toenails 1-5 b/l elongated, dystrophic, thickened, crumbly with subungual debris and tenderness to dorsal palpation. Hyperkeratotic lesion(s) plantarmedial midfoot left foot. He has subdermal hemorrhage noted sub talonavicular joint left foot and right posteromedial heel. There is no erythema, no edema, no drainage nor flocculence palpated. No abnormal warmth/coolness.  No erythema, no edema, no drainage, no flocculence.    Musculoskeletal Examination: Normal muscle strength 5/5 to all lower extremity muscle groups bilaterally. No pain crepitus or joint limitation noted with ROM b/l. Pes planus deformity noted b/l.  Plantarmedial midfoot collapse b/l.   Neurological Examination: Protective sensation diminished with 10g monofilament b/l. Vibratory  sensation decreased b/l.  Assessment: 1. Pain due to onychomycosis of toenails of both feet   2. Pre-ulcerative calluses   3. Pes planus of both feet   4. Diabetic peripheral neuropathy associated with type 2 diabetes mellitus ([place])     Plan: -Examined patient. -Inspected shoes and applied moleskin to tongue of right shoe as well as junction between diabetic insole and shoe which appears to be rubbing.  -Diabetic foot examination performed on today's visit. -Continue diabetic foot care principles. Literature dispensed on today.  -Toenails 1-5 b/l were debrided in length and girth with sterile nail nippers and dremel without iatrogenic bleeding.  -Preulcerative callus pared plantarmedial midfoot left foot utilizing sterile scalpel blade without incident. Will have Pedorthist offload inserts more where calluses are. -Patient to continue soft, supportive shoe gear daily. -Patient to report any pedal injuries to medical professional immediately. -Patient/POA to call should there be question/concern in the interim.  Return in about 9 weeks (around 10/28/2020) for nail and callus trim.

## 2020-08-30 NOTE — Telephone Encounter (Signed)
IMPRESSION: 1. Mildly progressive disc degeneration at L3-4 with mild spinal and neural foraminal stenosis. 2. Minimal disc bulging at L5-S1 without stenosis. 3. Chronic T12 and L1 compression fractures.  IMPRESSION: Mild cervical disc and facet degeneration without compressive stenosis. Normal appearance of the cervical spinal cord.  Please call patient, MRI of cervical spine and lumbar spine showed mild degenerative changes, there was no evidence of spinal cord or spinal nerve compression  His gait abnormality and weakness are most consistent with his significant peripheral neuropathy,  I will evaluate him further at next office follow-up visit

## 2020-09-01 DIAGNOSIS — Z23 Encounter for immunization: Secondary | ICD-10-CM | POA: Diagnosis not present

## 2020-09-05 ENCOUNTER — Telehealth: Payer: Self-pay | Admitting: *Deleted

## 2020-09-05 NOTE — Telephone Encounter (Signed)
Ross called from [facility] about Paul [name]. The only option he has for AFO braces would be double uprights. Other products have too high of a risk of causing skin issues/breakdown. The patient declined stating he felt they were too heavy. Ross informed me that he had also evaluated this patient years ago at their office and he declined the braces then as well.

## 2020-09-13 DIAGNOSIS — J441 Chronic obstructive pulmonary disease with (acute) exacerbation: Secondary | ICD-10-CM | POA: Diagnosis not present

## 2020-09-19 ENCOUNTER — Other Ambulatory Visit: Payer: Self-pay

## 2020-09-19 ENCOUNTER — Ambulatory Visit (INDEPENDENT_AMBULATORY_CARE_PROVIDER_SITE_OTHER): Payer: Medicare Other | Admitting: Podiatry

## 2020-09-19 ENCOUNTER — Encounter: Payer: Self-pay | Admitting: Podiatry

## 2020-09-19 DIAGNOSIS — E1142 Type 2 diabetes mellitus with diabetic polyneuropathy: Secondary | ICD-10-CM

## 2020-09-19 DIAGNOSIS — M2142 Flat foot [pes planus] (acquired), left foot: Secondary | ICD-10-CM | POA: Diagnosis not present

## 2020-09-19 DIAGNOSIS — L84 Corns and callosities: Secondary | ICD-10-CM | POA: Diagnosis not present

## 2020-09-19 DIAGNOSIS — M2141 Flat foot [pes planus] (acquired), right foot: Secondary | ICD-10-CM

## 2020-09-19 DIAGNOSIS — S90822A Blister (nonthermal), left foot, initial encounter: Secondary | ICD-10-CM | POA: Diagnosis not present

## 2020-09-19 NOTE — Patient Instructions (Signed)
Monitor for any signs/symptoms of infection. Signs of an infection could be redness beyond the site of the incision/procedure/wound, foul smelling odor, drainage that is thick and yellow or green, or severe swelling and pain. Call the office immediately if any occur or go directly to the emergency room. Call with any questions/concerns.    Apply betadine ointment and a bandage or band-aid daily until 11/12, if it is healing fine then can leave open to the air after this

## 2020-09-20 NOTE — Progress Notes (Signed)
  Subjective:  Patient ID: Paul [name], adult    DOB: 1941-05-14,  MRN: [id number]  Chief Complaint  Patient presents with  . Foot Ulcer    Patient presents today for "blood blister" bottom of left foot/arch noticed yesterday    79 y.o. adult presents with the above complaint. History confirmed with patient.   Objective:  Physical Exam: warm, good capillary refill, no trophic changes or ulcerative lesions and normal DP and PT pulses. Severe pes planus with prominent TN joint with callus and a mild partially hemorrhagic blister extending from this Assessment:  No diagnosis found.   Plan:  Patient was evaluated and treated and all questions answered.   Blister was de-roofed following betadine prep. Does not penetrate through dermis, no deep ulcer noted. Advised to rest as much as possible and to apply betadine daily and a bandage, can begin to leave open to air in 4-5 days.  Return in about 2 weeks (around 10/03/2020).

## 2020-09-27 DIAGNOSIS — M25552 Pain in left hip: Secondary | ICD-10-CM | POA: Diagnosis not present

## 2020-10-04 ENCOUNTER — Ambulatory Visit (INDEPENDENT_AMBULATORY_CARE_PROVIDER_SITE_OTHER): Payer: Medicare Other

## 2020-10-04 ENCOUNTER — Other Ambulatory Visit: Payer: Self-pay

## 2020-10-04 ENCOUNTER — Ambulatory Visit (INDEPENDENT_AMBULATORY_CARE_PROVIDER_SITE_OTHER): Payer: Medicare Other | Admitting: Podiatry

## 2020-10-04 DIAGNOSIS — M2141 Flat foot [pes planus] (acquired), right foot: Secondary | ICD-10-CM

## 2020-10-04 DIAGNOSIS — L97522 Non-pressure chronic ulcer of other part of left foot with fat layer exposed: Secondary | ICD-10-CM

## 2020-10-04 DIAGNOSIS — M79672 Pain in left foot: Secondary | ICD-10-CM

## 2020-10-04 DIAGNOSIS — L03032 Cellulitis of left toe: Secondary | ICD-10-CM

## 2020-10-04 DIAGNOSIS — E1165 Type 2 diabetes mellitus with hyperglycemia: Secondary | ICD-10-CM | POA: Diagnosis not present

## 2020-10-04 DIAGNOSIS — E1142 Type 2 diabetes mellitus with diabetic polyneuropathy: Secondary | ICD-10-CM

## 2020-10-04 DIAGNOSIS — L02612 Cutaneous abscess of left foot: Secondary | ICD-10-CM

## 2020-10-04 DIAGNOSIS — M2142 Flat foot [pes planus] (acquired), left foot: Secondary | ICD-10-CM

## 2020-10-04 MED ORDER — DOXYCYCLINE HYCLATE 100 MG PO TABS: 100.0000 mg | ORAL_TABLET | Freq: Two times a day (BID) | ORAL | 0 refills | Status: DC

## 2020-10-04 MED ORDER — MUPIROCIN 2 % EX OINT: 1.0000 "application " | TOPICAL_OINTMENT | Freq: Two times a day (BID) | CUTANEOUS | 2 refills | Status: DC

## 2020-10-04 NOTE — Patient Instructions (Signed)
Leave the dressing on until Thursday morning. Then change daily with the mupirocin ointment and a bandage.  Monitor for any signs/symptoms of infection. Signs of an infection could be redness beyond the site of the incision/procedure/wound, foul smelling odor, drainage that is thick and yellow or green, or severe swelling and pain. Call the office immediately if any occur or go directly to the emergency room. Call with any questions/concerns.

## 2020-10-05 ENCOUNTER — Other Ambulatory Visit: Payer: Self-pay | Admitting: Podiatry

## 2020-10-05 ENCOUNTER — Encounter: Payer: Self-pay | Admitting: Podiatry

## 2020-10-05 DIAGNOSIS — L97522 Non-pressure chronic ulcer of other part of left foot with fat layer exposed: Secondary | ICD-10-CM

## 2020-10-05 NOTE — Progress Notes (Signed)
  Subjective:  Patient ID: Paul [name], adult    DOB: December 02, 1940,  MRN: [id number]  Chief Complaint  Patient presents with  . Diabetic Ulcer    Left foot medial aspect ulceration. Pt states yellow drainage 3 days ago. Denies fever/nausea/vomiting/chills.  . Leg Problem    Left lower extremity swelling.  . Foot Problem    Pt states "I have two spots I want looked at" right dorsal aspect discolored skin.    79 y.o. adult returns with the above complaint. History confirmed with patient.  It had some drainage over the weekend.  They had been putting Neosporin on it.  He notes swelling in his left leg and foot.  Objective:  Physical Exam: warm, good capillary refill, no trophic changes or ulcerative lesions and normal DP and PT pulses. Severe pes planus with prominent TN joint, the blister has turned into a full-thickness ulcer proximally 2.0 x 1.5 x 0.2 cm in dimension with a fibrogranular wound base and mild hyperkeratosis.  There is erythema around the wound and cellulitis extending up to the ankle.  Tender to palpation.  Serous drainage.  Ulcer extends to subcutaneous tissue no exposed bone tendon or capsule joint    Radiographs left foot: Ulcer noted on plain film radiographs there is no evidence of bony erosion or osteomyelitis, no subcutaneous emphysema noted Assessment:   1. Pain in left foot   2. Ulcer of left foot with fat layer exposed ([place])   3. Diabetic peripheral neuropathy associated with type 2 diabetes mellitus ([place])   4. Pes planus of both feet   5. Cellulitis and abscess of toe of left foot   6. Uncontrolled type 2 diabetes mellitus with hyperglycemia ([place])      Plan:  Patient was evaluated and treated and all questions answered.  Patient educated on diabetes. Discussed proper diabetic foot care and discussed risks and complications of disease. Educated patient in depth on reasons to return to the office immediately should he/she discover anything concerning or  new on the feet. All questions answered. Discussed proper shoes as well.   Ulcer left midfoot -Debridement as below. -Dressed with Iodosorb, DSD. -Surgical shoe was dispensed -Continue off-loading with surgical shoe.  -Today he had cellulitis extending from the wound with increased drainage.  Concern he has a mild soft tissue infection.  We reviewed the x-rays and discussed that currently there is not evidence of deeper bone infection.  I recommend we treat this with topical antibiotic ointment with mupirocin which was prescribed for him as well as an oral antibiotic with doxycycline for 2 weeks which was also prescribed for him.  Procedure: Excisional Debridement of Wound Rationale: Removal of non-viable soft tissue from the wound to promote healing.  Anesthesia: none Pre-Debridement Wound Measurements: 2.0 cm x 1.2 cm x 0.2 cm  Post-Debridement Wound Measurements: 2.2 cm x 1.3 cm x 0.3 cm  Type of Debridement: Sharp Excisional Tissue Removed: Non-viable soft tissue Depth of Debridement: subcutaneous tissue. Technique: Sharp excisional debridement to bleeding, viable wound base.  Dressing: Dry, sterile, compression dressing. Disposition: Patient tolerated procedure well.   Return in about 1 week (around 10/11/2020).

## 2020-10-11 ENCOUNTER — Other Ambulatory Visit: Payer: Self-pay

## 2020-10-11 ENCOUNTER — Encounter: Payer: Self-pay | Admitting: Neurology

## 2020-10-11 ENCOUNTER — Ambulatory Visit (INDEPENDENT_AMBULATORY_CARE_PROVIDER_SITE_OTHER): Payer: Medicare Other | Admitting: Neurology

## 2020-10-11 VITALS — BP 119/71 | HR 80

## 2020-10-11 DIAGNOSIS — R202 Paresthesia of skin: Secondary | ICD-10-CM | POA: Diagnosis not present

## 2020-10-11 DIAGNOSIS — I2510 Atherosclerotic heart disease of native coronary artery without angina pectoris: Secondary | ICD-10-CM | POA: Diagnosis not present

## 2020-10-11 DIAGNOSIS — G6289 Other specified polyneuropathies: Secondary | ICD-10-CM

## 2020-10-11 DIAGNOSIS — R269 Unspecified abnormalities of gait and mobility: Secondary | ICD-10-CM

## 2020-10-11 NOTE — Progress Notes (Signed)
HISTORICAL  Paul [name] is a 79 year old male, seen in request by his primary care physician Dr. Stephanie Acre, [name] for evaluation of bilateral lower extremity paresthesia, is accompanied by his wife [name] at today's visit on August 04, 2020.  I reviewed and summarized the referring note.  Past medical history Diabetes Lung cancer in 2007, s/p left lobectomy, radial active chips. 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 Atrial fibrillation, on eqliuis  He has a long history of diabetic peripheral neuropathy, and bilateral feet paresthesia, wearing special diabetic shoes, he also reported a history of fall injury around 2017, broke his left leg, requiring surgery, also had a history of lumbar compression fracture in the past, had a kyphoplasty procedure  At baseline, he uses home oxygen for lumbar ambulance, rely on his walker, denies significant neck or low back pain, has urinary urgency, frequency, occasional incontinence  Early September 2021, without clear triggers, he noticed worsening left leg pain, traveling alone left hip, to left thigh, left anterior and posterior leg, left foot felt number, he also have a long history of intermittent bilateral finger paresthesia,   Laboratory evaluation in August 2021, normal TSH, CBC, hemoglobin of 13.6, CMP creatinine of 0.83, LDL 33, vitamin D was 45, A1c was 6.7,  UPDATE Oct 11 2020: He fell recently, had blister at left foot, developed into an ulcer of his left foot, painful and sensitive at times,  EMG/NCS in Sept 2021 showed: Eectrodiagnostic evidence of 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  In addition, there is also evidence of chronic bilateral lumbosacral radiculopathy, involving bilateral L4, L5, S1 myotomes; and right cervical radiculopathy, involving right C5, 6 and 7 myotomes.  I personally reviewed MRI lumbar in Oct 2021: Mildly progressive  disc degeneration at L3-4 with mild spinal and neural foraminal stenosis. Minimal disc bulging at L5-S1 without stenosis. Chronic T12 and L1 compression fractures.  MRI cervical:Mild cervical disc and facet degeneration without compressive stenosis. Normal appearance of the cervical spinal cord.    He now complains of worsening bilateral lower extremity paresthesia, to knee level and recent bilateral finger tips paresthesia, burning sensation. He does not walk much any more, relies on his walker to walk from bedroom to dinning room some.  REVIEW OF SYSTEMS: Full 14 system review of systems performed and notable only for as above All other review of systems were negative.  ALLERGIES: Allergies  Allergen Reactions  . Codeine Anaphylaxis  . Phenobarbital Hypertension    HOME MEDICATIONS: Current Outpatient Medications  Medication Sig Dispense Refill  . acetaminophen (TYLENOL) 500 MG tablet Take 1,000 mg by mouth daily.     Marland Kitchen albuterol (PROAIR HFA) 108 (90 BASE) MCG/ACT inhaler Inhale 2 puffs into the lungs every 2 (two) hours as needed for wheezing or shortness of breath.     Marland Kitchen apixaban (ELIQUIS) 5 MG TABS tablet Take 1 tablet (5 mg total) by mouth 2 (two) times daily. 180 tablet 3  . atorvastatin (LIPITOR) 40 MG tablet TAKE 1 TABLET BY MOUTH AT  BEDTIME 90 tablet 0  . budesonide-formoterol (SYMBICORT) 160-4.5 MCG/ACT inhaler TAKE 2 PUFFS FIRST THING IN THE MORNING AND THEN  ANOTHER 2 PUFFS ABOUT 12  HOURS LATER. 30.6 g 3  . calcium carbonate (OS-CAL) 600 MG TABS tablet Take by mouth.    . doxycycline (VIBRA-TABS) 100 MG tablet Take 1 tablet (100 mg total) by mouth 2 (two) times daily. 20 tablet 0  . fexofenadine (ALLEGRA) 180 MG  tablet Take 180 mg by mouth daily.    . fluticasone (FLONASE) 50 MCG/ACT nasal spray Place 1 spray into the nose 2 (two) times daily.     . furosemide (LASIX) 20 MG tablet TAKE 1 TABLET BY MOUTH  DAILY 90 tablet 0  . gabapentin (NEURONTIN) 300 MG capsule 4 at  bedtime    . glimepiride (AMARYL) 2 MG tablet Take 2 mg by mouth 2 (two) times daily.     Marland Kitchen guaiFENesin (MUCINEX) 600 MG 12 hr tablet Take 1,200 mg by mouth daily with breakfast.     . losartan (COZAAR) 50 MG tablet Take 1 tablet (50 mg total) by mouth daily. (Patient taking differently: Take 50 mg by mouth daily with breakfast. ) 90 tablet 0  . metFORMIN (GLUCOPHAGE) 1000 MG tablet Take 1,000 mg by mouth 2 (two) times daily with a meal.      . metoCLOPramide (REGLAN) 10 MG tablet Take 5 mg by mouth 3 (three) times daily before meals.     . Multiple Vitamins-Minerals (CENTRUM SILVER ADULT 50+) TABS Take 1 tablet by mouth daily with breakfast.    . mupirocin ointment (BACTROBAN) 2 % Apply 1 application topically 2 (two) times daily. 30 g 2  . omeprazole (PRILOSEC) 20 MG capsule Take 20 mg by mouth daily.    . OXYGEN 4L of oxygen at night and 3L of oxygen when walking long distances    . SPIRIVA RESPIMAT 2.5 MCG/ACT AERS USE 2 SPRAYS (INHALATIONS)  EVERY MORNING 12 g 3  . vitamin C (ASCORBIC ACID) 250 MG tablet Take 250 mg by mouth daily.     No current facility-administered medications for this visit.    PAST MEDICAL HISTORY: Past Medical History:  Diagnosis Date  . Asthma   . Atrial fibrillation ([place])   . CAD (coronary artery disease)    Prior PTCA  . COPD (chronic obstructive pulmonary disease) ([place])   . Diabetes mellitus   . GERD (gastroesophageal reflux disease)   . Hard of hearing   . Hyperlipidemia   . Hypertension   . lung ca dx'd 01/2006  . Nephrolithiasis   . Neuropathy   . Osteopenia   . Pulmonary embolus ([place])   . Tibia/fibula fracture 07/2016   Left  . Tremor, essential     PAST SURGICAL HISTORY: Past Surgical History:  Procedure Laterality Date  . Angioplasty  Approx 1993  . CARDIAC CATHETERIZATION  Approximately 1993  . CHOLECYSTECTOMY    . 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 FIXATION KYPHOPLASTY    . LUNG CANCER SURGERY  01/2006   Small excision of left  lung tissue; mesh with radioactive seeds (per pt report)  . ORIF TIBIA FRACTURE Left 08/22/2016   Procedure: OPEN REDUCTION INTERNAL FIXATION (ORIF) TIBIA FRACTURE;  Surgeon: Nicholes Stairs, MD;  Location: [facility];  Service: Orthopedics;  Laterality: Left;  . ROTATOR CUFF TEAR  2016  . Tibia/Fibula Repair Left 07/2016  . TONSILLECTOMY      FAMILY HISTORY: Family History  Problem Relation Age of Onset  . Heart disease Mother   . Tremor Mother   . Dementia Mother   . Heart Problems Father     SOCIAL HISTORY: Social History   Socioeconomic History  . Marital status: Married    Spouse name: Santiago Glad  . Number of children: 2  . Years of education: college  . Highest education level: Not on file  Occupational History    Employer: RETIRED    Comment:  Retired from [employer]  Tobacco Use  . Smoking status: Current Every Day Smoker    Packs/day: 0.50    Years: 59.00    Pack years: 29.50    Types: Cigarettes  . Smokeless tobacco: Never Used  Substance and Sexual Activity  . Alcohol use: Not Currently    Alcohol/week: 1.0 standard drink    Types: 1 Glasses of wine per week  . Drug use: Never  . Sexual activity: Never  Other Topics Concern  . Not on file  Social History Narrative   Patient lives at home with his wife Santiago Glad).   Retired.   Education- College    Right handed.   Caffeine- three cups of coffee daily.   Social Determinants of Health   Financial Resource Strain:   . Difficulty of Paying Living Expenses: Not on file  Food Insecurity:   . Worried About Charity fundraiser in the Last Year: Not on file  . Ran Out of Food in the Last Year: Not on file  Transportation Needs:   . Lack of Transportation (Medical): Not on file  . Lack of Transportation (Non-Medical): Not on file  Physical Activity:   . Days of Exercise per Week: Not on file  . Minutes of Exercise per Session: Not on file  Stress:   . Feeling of Stress : Not on file  Social Connections:   .  Frequency of Communication with Friends and Family: Not on file  . Frequency of Social Gatherings with Friends and Family: Not on file  . Attends Religious Services: Not on file  . Active Member of Clubs or Organizations: Not on file  . Attends Archivist Meetings: Not on file  . Marital Status: Not on file  Intimate Partner Violence:   . Fear of Current or Ex-Partner: Not on file  . Emotionally Abused: Not on file  . Physically Abused: Not on file  . Sexually Abused: Not on file     PHYSICAL EXAM   Vitals:   10/11/20 1124  BP: 119/71  Pulse: 80   Not recorded     There is no height or weight on file to calculate BMI.  PHYSICAL EXAMNIATION:  Gen: NAD, conversant, well nourised, well groomed                     Cardiovascular: Regular rate rhythm, no peripheral edema, warm, nontender. Eyes: Conjunctivae clear without exudates or hemorrhage Neck: Supple, no carotid bruits. Pulmonary: Clear to auscultation bilaterally, wearing nasal oxygen  NEUROLOGICAL EXAM:  MENTAL STATUS: Shortness of breath with minimum exertion Speech:    Speech is normal; fluent and spontaneous with normal comprehension.  Cognition:     Orientation to time, place and person     Normal recent and remote memory     Normal Attention span and concentration     Normal Language, naming, repeating,spontaneous speech     Fund of knowledge   CRANIAL NERVES: CN II: Visual fields are full to confrontation. Pupils are round equal and briskly reactive to light. CN III, IV, VI: extraocular movement are normal. No ptosis. CN V: Facial sensation is intact to light touch CN VII: Face is symmetric with normal eye closure  CN VIII: Hearing is normal to causal conversation. CN IX, X: Phonation is normal. CN XI: Head turning and shoulder shrug are intact  MOTOR: Moderate bilateral intrinsic hand muscle atrophy, no upper extremity proximal muscle weakness, moderate bilateral finger abduction, grip  weakness.  No lower extremity proximal muscle weakness.  Moderate bilateral ankle dorsiflexion, plantarflexion weakness, left worse than right. Left foot in brace.  REFLEXES: Areflexia  SENSORY: Length dependent decreased light touch, pinprick to bilateral knee level, decreased finger vibratory sensation.  COORDINATION: There is no trunk or limb dysmetria noted.  GAIT/STANCE: Deferred.   DIAGNOSTIC DATA (LABS, IMAGING, TESTING) - I reviewed patient records, labs, notes, testing and imaging myself where available.   ASSESSMENT AND PLAN  LABRIAN TORREGROSSA is a 79 y.o. adult   Gait abnormalities Peripheral neuropathy  EMG/NCS in Sept showed moderate to severe polyneuropathy, with evidence of axonal loss, there are also demyelinating features, as evident by prolonged distal latency, F wave latencies.  He complains of worsening symptoms, with worsening progressive paresthesia and weakness, gait abnormalities.   Continue Gabapentin 318m 4 times a day  IVIG 2g/kg as initial loading dose, followed by 1g/kg every 4 weeks,   Is not a good candidate for lumbar puncture due to atrial fibrillation, on chronic Eliquis treatment,   YMarcial Pacas M.D. Ph.D.  [facility] [address] [place] [id number]: [phone]: [phone] CC:  WJonathon Jordan MD [address]  [place] [id number]

## 2020-10-11 NOTE — Addendum Note (Signed)
Addended by: Marcial Pacas on: 10/11/2020 01:09 PM   Modules accepted: Level of Service

## 2020-10-14 ENCOUNTER — Ambulatory Visit (INDEPENDENT_AMBULATORY_CARE_PROVIDER_SITE_OTHER): Payer: Medicare Other | Admitting: Podiatry

## 2020-10-14 ENCOUNTER — Other Ambulatory Visit: Payer: Self-pay

## 2020-10-14 DIAGNOSIS — M79672 Pain in left foot: Secondary | ICD-10-CM

## 2020-10-14 DIAGNOSIS — E1142 Type 2 diabetes mellitus with diabetic polyneuropathy: Secondary | ICD-10-CM

## 2020-10-14 DIAGNOSIS — L97522 Non-pressure chronic ulcer of other part of left foot with fat layer exposed: Secondary | ICD-10-CM | POA: Diagnosis not present

## 2020-10-14 DIAGNOSIS — M2142 Flat foot [pes planus] (acquired), left foot: Secondary | ICD-10-CM

## 2020-10-14 DIAGNOSIS — M2141 Flat foot [pes planus] (acquired), right foot: Secondary | ICD-10-CM | POA: Diagnosis not present

## 2020-10-14 NOTE — Patient Instructions (Signed)
Continue using mupirocin ointment daily   Monitor for any signs/symptoms of infection. Signs of an infection could be redness beyond the site of the incision/procedure/wound, foul smelling odor, drainage that is thick and yellow or green, or severe swelling and pain. Call the office immediately if any occur or go directly to the emergency room. Call with any questions/concerns.

## 2020-10-18 NOTE — Progress Notes (Signed)
  Subjective:  Patient ID: Paul [name], adult    DOB: [birth year],  MRN: [id number]  Chief Complaint  Patient presents with  . Wound Check    Pt stated that he is doing okay but does have some pain when he walks     79 y.o. adult returns with the above complaint. History confirmed with patient.  Here with his wife today.  They feel like it is doing a bit better.  Applying mupirocin.  Objective:  Physical Exam: warm, good capillary refill, no trophic changes or ulcerative lesions and normal DP and PT pulses. 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  No cellulitis.  Minimal drainage.  No tenderness.   Radiographs left foot: Ulcer noted on plain film radiographs there is no evidence of bony erosion or osteomyelitis, no subcutaneous emphysema noted Assessment:   No diagnosis found.   Plan:  Patient was evaluated and treated and all questions answered.  Patient educated on diabetes. Discussed proper diabetic foot care and discussed risks and complications of disease. Educated patient in depth on reasons to return to the office immediately should he/she discover anything concerning or new on the feet. All questions answered. Discussed proper shoes as well.   Ulcer left midfoot -No debridement today -Complete antibiotics -Continue using mupirocin -Continue wearing surgical shoe  Return in about 2 weeks (around 10/28/2020).

## 2020-10-28 ENCOUNTER — Other Ambulatory Visit: Payer: Self-pay

## 2020-10-28 ENCOUNTER — Ambulatory Visit (INDEPENDENT_AMBULATORY_CARE_PROVIDER_SITE_OTHER): Payer: Medicare Other | Admitting: Podiatry

## 2020-10-28 ENCOUNTER — Ambulatory Visit: Payer: Medicare Other | Admitting: Podiatry

## 2020-10-28 DIAGNOSIS — E1142 Type 2 diabetes mellitus with diabetic polyneuropathy: Secondary | ICD-10-CM

## 2020-10-28 DIAGNOSIS — L97522 Non-pressure chronic ulcer of other part of left foot with fat layer exposed: Secondary | ICD-10-CM

## 2020-10-28 DIAGNOSIS — M79672 Pain in left foot: Secondary | ICD-10-CM

## 2020-10-30 NOTE — Progress Notes (Signed)
  Subjective:  Patient ID: Paul [name], adult    DOB: [birth year],  MRN: [id number]  Chief Complaint  Patient presents with  . Wound Check    Pt stated that he is having pain in that area and is concerned that the shoe is not giving him enough support.     79 y.o. adult returns with the above complaint. History confirmed with patient.  Here with his wife today. Still having pain. He would like to get a surgical shoe.Marland Kitchen  Applying mupirocin.  Objective:  Physical Exam: warm, good capillary refill, no trophic changes or ulcerative lesions and normal DP and PT pulses. 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  No cellulitis.  Minimal drainage.  No tenderness.   Radiographs left foot: Ulcer noted on plain film radiographs there is no evidence of bony erosion or osteomyelitis, no subcutaneous emphysema noted Assessment:   1. Pain in left foot   2. 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 Diabetic peripheral neuropathy associated with type 2 diabetes mellitus ([place])      Plan:  Patient was evaluated and treated and all questions answered.  Patient educated on diabetes. Discussed proper diabetic foot care and discussed risks and complications of disease. Educated patient in depth on reasons to return to the office immediately should he/she discover anything concerning or new on the feet. All questions answered. Discussed proper shoes as well.   Ulcer left foot midfoot -Debridement as below. -Dressed with Prisma collagen dressing, DSD. Switch to this at home, when they run out they can switch back to mupirocin -Continue off-loading with surgical shoe. I would like to bring his diabetic shoes and inserts the next visit when so that we can offload and adjust as he will be more comfortable and not negatively impact the ulceration  Procedure: Excisional  Debridement of Wound Rationale: Removal of non-viable soft tissue from the wound to promote healing.  Anesthesia: none Pre-Debridement Wound Measurements: 1.5 x 0.6 x 0.2 cm  Post-Debridement Wound Measurements: Same as predebridement Type of Debridement: Sharp selective Tissue Removed: Non-viable soft tissue Depth of Debridement: subcutaneous tissue. Technique: Sharp selective debridement to bleeding, viable wound base.  Dressing: Dry, sterile, compression dressing. Disposition: Patient tolerated procedure well.      Return in about 2 weeks (around 11/11/2020).

## 2020-11-07 ENCOUNTER — Telehealth: Payer: Self-pay | Admitting: Podiatry

## 2020-11-07 NOTE — Telephone Encounter (Signed)
Pt states his pronogran is dried out and feels that it'll no longer be effective. Please advise.

## 2020-11-07 NOTE — Telephone Encounter (Signed)
They can apply it with some sterile saline to moisten it if need be, but it's safe to use

## 2020-11-08 ENCOUNTER — Encounter: Payer: Self-pay | Admitting: Podiatry

## 2020-11-08 ENCOUNTER — Other Ambulatory Visit: Payer: Self-pay

## 2020-11-08 ENCOUNTER — Ambulatory Visit (INDEPENDENT_AMBULATORY_CARE_PROVIDER_SITE_OTHER): Payer: Medicare Other | Admitting: Podiatry

## 2020-11-08 DIAGNOSIS — E1142 Type 2 diabetes mellitus with diabetic polyneuropathy: Secondary | ICD-10-CM | POA: Diagnosis not present

## 2020-11-08 DIAGNOSIS — L97522 Non-pressure chronic ulcer of other part of left foot with fat layer exposed: Secondary | ICD-10-CM

## 2020-11-08 DIAGNOSIS — M79675 Pain in left toe(s): Secondary | ICD-10-CM | POA: Diagnosis not present

## 2020-11-08 DIAGNOSIS — B351 Tinea unguium: Secondary | ICD-10-CM | POA: Diagnosis not present

## 2020-11-08 DIAGNOSIS — M79674 Pain in right toe(s): Secondary | ICD-10-CM

## 2020-11-08 NOTE — Progress Notes (Signed)
Complaint:  Visit Type: Patient returns to my office for continued preventative foot care services. Complaint: Patient states" my nails have grown long and thick and become painful to walk and wear shoes" Patient has been diagnosed with DM with neuropathy. The patient presents for preventative foot care services.  He is under treatment for an ulcer left midfoot. By Dr.  Sherryle Lis.  Podiatric Exam: Vascular: dorsalis pedis  Are weakly  palpable bilateral.  Posterior tibial p[ulses  B/L.   Capillary return is immediate. Cold feet noted. Absent digital hair  B/L. Sensorium: Normal Semmes Weinstein monofilament test. Normal tactile sensation bilaterally. Nail Exam: Pt has thick disfigured discolored nails with subungual debris noted bilateral entire nail hallux through fifth toenails Ulcer Exam: There is no evidence of ulcer or pre-ulcerative changes or infection. Orthopedic Exam: Muscle tone and strength are WNL. No limitations in general ROM. No crepitus or effusions noted. Foot type and digits show no abnormalities. Bony prominences are unremarkable. Swelling feet  B/L. Skin: No Porokeratosis. Ulcer left foot.   Appointment this Thursday with Dr.  Sherryle Lis.  Diagnosis:  Onychomycosis, , Pain in right toe, pain in left toes  Treatment & Plan Procedures and Treatment: Consent by patient was obtained for treatment procedures.   Debridement of mycotic and hypertrophic toenails, 1 through 5 bilateral and clearing of subungual debris. No ulceration, no infection noted.  Return Visit-Office Procedure: Patient instructed to return to the office for a follow up visit 3 months for continued evaluation and treatment.    Gardiner Barefoot DPM

## 2020-11-10 ENCOUNTER — Other Ambulatory Visit: Payer: Self-pay

## 2020-11-10 ENCOUNTER — Ambulatory Visit (INDEPENDENT_AMBULATORY_CARE_PROVIDER_SITE_OTHER): Payer: Medicare Other | Admitting: Podiatry

## 2020-11-10 DIAGNOSIS — L97522 Non-pressure chronic ulcer of other part of left foot with fat layer exposed: Secondary | ICD-10-CM

## 2020-11-10 DIAGNOSIS — M2142 Flat foot [pes planus] (acquired), left foot: Secondary | ICD-10-CM | POA: Diagnosis not present

## 2020-11-11 ENCOUNTER — Encounter: Payer: Self-pay | Admitting: Podiatry

## 2020-11-11 NOTE — Progress Notes (Signed)
  Subjective:  Patient ID: Paul [name], adult    DOB: January 02, 1941,  MRN: [id number]  Chief Complaint  Patient presents with  . Foot Pain    PT stated that the surgical shoe is causing him a lot of pain on the bottom of his foot and in his leg.    79 y.o. adult returns with the above complaint. History confirmed with patient.  Feels like it has been improving.  He would like to get out of the surgical shoe as it has not been supporting his foot and ankle  Objective:  Physical Exam: warm, good capillary refill, no trophic changes or ulcerative lesions and normal DP and PT pulses. 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  No cellulitis.  Minimal drainage.  No tenderness.   Radiographs left foot: Ulcer noted on plain film radiographs there is no evidence of bony erosion or osteomyelitis, no subcutaneous emphysema noted Assessment:   1. 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  Patient educated on diabetes. Discussed proper diabetic foot care and discussed risks and complications of disease. Educated patient in depth on reasons to return to the office immediately should he/she discover anything concerning or new on the feet. All questions answered. Discussed proper shoes as well.   Ulcer left foot midfoot -Debridement as below. -Dressed with Prisma collagen dressing, DSD.  Continue this at home every 72 hours, when they run out they can switch back to mupirocin -He can return to his diabetic shoes I adjusted and applied an offloading pad within his left shoe to offload the ulcer  Procedure: Excisional Debridement of Wound Rationale: Removal of non-viable soft tissue from the wound to promote healing.  Anesthesia: none Pre-Debridement Wound Measurements: 1.0 x 0.4 x 0.2 cm   Post-Debridement Wound Measurements: Same as predebridement Type of Debridement: Sharp selective Tissue Removed: Non-viable soft tissue Depth of Debridement: subcutaneous tissue. Technique: Sharp selective debridement to bleeding, viable wound base.  Dressing: Dry, sterile, compression dressing. Disposition: Patient tolerated procedure well.      Return in about 2 weeks (around 11/24/2020) for wound re-check.

## 2020-11-24 ENCOUNTER — Ambulatory Visit: Payer: Medicare Other | Admitting: Podiatry

## 2020-12-08 ENCOUNTER — Ambulatory Visit (INDEPENDENT_AMBULATORY_CARE_PROVIDER_SITE_OTHER): Payer: Medicare Other | Admitting: Podiatry

## 2020-12-08 ENCOUNTER — Other Ambulatory Visit: Payer: Self-pay

## 2020-12-08 DIAGNOSIS — L97522 Non-pressure chronic ulcer of other part of left foot with fat layer exposed: Secondary | ICD-10-CM | POA: Diagnosis not present

## 2020-12-08 NOTE — Patient Instructions (Signed)
Change the bandage daily with the mupirocin ointment. Every 3 days, add more collagen. You may get it wet prior to doing this

## 2020-12-08 NOTE — Progress Notes (Signed)
  Subjective:  Patient ID: Paul [name], adult    DOB: 1941-07-09,  MRN: [id number]  Chief Complaint  Patient presents with  . Wound Check    PT stated that he is doing okay he has no major concerns at this time    80 y.o. adult returns with the above complaint. History confirmed with patient.  Feels like it has been improving.  Objective:  Physical Exam: warm, good capillary refill, no trophic changes or ulcerative lesions and normal DP and PT pulses. 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  No cellulitis.  Minimal drainage.  No tenderness. Overall improved since last visit     Radiographs left foot: Ulcer noted on plain film radiographs there is no evidence of bony erosion or osteomyelitis, no subcutaneous emphysema noted Assessment:   1. 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  Patient educated on diabetes. Discussed proper diabetic foot care and discussed risks and complications of disease. Educated patient in depth on reasons to return to the office immediately should he/she discover anything concerning or new on the feet. All questions answered. Discussed proper shoes as well.   Ulcer left foot midfoot -Debridement as below. -Dressed with Prisma collagen dressing, DSD.  Continue this at home every 72 hours, when they run out they can switch back to mupirocin  Procedure: Excisional Debridement of Wound Rationale: Removal of non-viable soft tissue from the wound to promote healing.  Anesthesia: none Pre-Debridement Wound Measurements: 0.7 x 0.3 x0.1 cm Post-Debridement Wound Measurements: Same as predebridement Type of Debridement: Sharp selective Tissue Removed: Non-viable soft tissue Depth of Debridement: subcutaneous tissue. Technique: Sharp selective debridement to  bleeding, viable wound base.  Dressing: Dry, sterile, compression dressing. Disposition: Patient tolerated procedure well.      Return in about 3 weeks (around 12/29/2020) for wound re-check left foot.

## 2020-12-11 ENCOUNTER — Encounter: Payer: Self-pay | Admitting: Podiatry

## 2020-12-29 ENCOUNTER — Other Ambulatory Visit: Payer: Self-pay

## 2020-12-29 ENCOUNTER — Ambulatory Visit (INDEPENDENT_AMBULATORY_CARE_PROVIDER_SITE_OTHER): Payer: Medicare Other | Admitting: Podiatry

## 2020-12-29 DIAGNOSIS — E1142 Type 2 diabetes mellitus with diabetic polyneuropathy: Secondary | ICD-10-CM

## 2020-12-29 DIAGNOSIS — M2142 Flat foot [pes planus] (acquired), left foot: Secondary | ICD-10-CM

## 2020-12-29 DIAGNOSIS — L97522 Non-pressure chronic ulcer of other part of left foot with fat layer exposed: Secondary | ICD-10-CM | POA: Diagnosis not present

## 2021-01-01 ENCOUNTER — Encounter: Payer: Self-pay | Admitting: Podiatry

## 2021-01-01 NOTE — Progress Notes (Signed)
  Subjective:  Patient ID: Paul [name], adult    DOB: 12/13/1940,  MRN: [id number]  Chief Complaint  Patient presents with  . Wound Check    New blister on foot- pt's wife is concern with it- mentioned theres no pain- further evaluation     80 y.o. adult returns with the above complaint. History confirmed with patient.  Feels like it has been improving.  Objective:  Physical Exam: warm, good capillary refill, no trophic changes or ulcerative lesions and normal DP and PT pulses. Severe pes planus with prominent TN joint, full-thickness ulcer approximately 0.6 x 0.2 x0.1 dimension with a fibrogranular wound base and mild hyperkeratosis.  No cellulitis.  Minimal drainage.  No tenderness. Overall improved since last visit       Radiographs left foot: Ulcer noted on plain film radiographs there is no evidence of bony erosion or osteomyelitis, no subcutaneous emphysema noted Assessment:   No diagnosis found.   Plan:  Patient was evaluated and treated and all questions answered.  Patient educated on diabetes. Discussed proper diabetic foot care and discussed risks and complications of disease. Educated patient in depth on reasons to return to the office immediately should he/she discover anything concerning or new on the feet. All questions answered. Discussed proper shoes as well.   Ulcer left foot midfoot -Debridement as below. -Dressed with Prisma collagen dressing, DSD.  Continue this at home every 72 hours, when they run out they can switch back to mupirocin  Procedure: Excisional Debridement of Wound Rationale: Removal of non-viable soft tissue from the wound to promote healing.  Anesthesia: none Pre-Debridement Wound Measurements: 0.6 x 0.2 x0.1 cm Post-Debridement Wound Measurements: Same as predebridement Type of Debridement: Sharp selective Tissue Removed: Non-viable soft tissue Depth of Debridement: subcutaneous tissue. Technique: Sharp selective debridement to  bleeding, viable wound base.  Dressing: Dry, sterile, compression dressing. Disposition: Patient tolerated procedure well.      Return in about 3 weeks (around 01/19/2021).

## 2021-01-03 NOTE — Progress Notes (Signed)
HPI: FUCAD andatrial fibrillation. Patient states had PCI of LAD in 1988. Patient previously admitted with a pulmonary embolus. During hospitalization he developed atrial fibrillation with a rapid ventricular response. Nuclear study 3/17 showed EF 65, attenuation artifact and no ischemia.Diagnosed with recurrent atrial fibrillation April 2021.  Echocardiogram August 2021 showed normal LV function, mild LVH, mild right ventricular enlargement, moderate right atrial enlargement and dilated ascending aorta measuring 43 mm. Monitor september 2021 showed atrial fibrillation rate controlled. Since I last saw him,he denies dyspnea, chest pain, palpitations or syncope.  No bleeding.  Current Outpatient Medications  Medication Sig Dispense Refill  . acetaminophen (TYLENOL) 500 MG tablet Take 1,000 mg by mouth daily.     Marland Kitchen albuterol (VENTOLIN HFA) 108 (90 Base) MCG/ACT inhaler Inhale 2 puffs into the lungs every 2 (two) hours as needed for wheezing or shortness of breath.     Marland Kitchen apixaban (ELIQUIS) 5 MG TABS tablet Take 1 tablet (5 mg total) by mouth 2 (two) times daily. 180 tablet 3  . atorvastatin (LIPITOR) 40 MG tablet TAKE 1 TABLET BY MOUTH AT  BEDTIME 90 tablet 0  . budesonide-formoterol (SYMBICORT) 160-4.5 MCG/ACT inhaler TAKE 2 PUFFS FIRST THING IN THE MORNING AND THEN  ANOTHER 2 PUFFS ABOUT 12  HOURS LATER. 30.6 g 3  . calcium carbonate (OS-CAL) 600 MG TABS tablet Take by mouth.    . fexofenadine (ALLEGRA) 180 MG tablet Take 180 mg by mouth daily.    . fluticasone (FLONASE) 50 MCG/ACT nasal spray Place 1 spray into the nose 2 (two) times daily.    . furosemide (LASIX) 20 MG tablet TAKE 1 TABLET BY MOUTH  DAILY 90 tablet 0  . gabapentin (NEURONTIN) 300 MG capsule 4 at bedtime    . glimepiride (AMARYL) 2 MG tablet Take 2 mg by mouth 2 (two) times daily.     Marland Kitchen guaiFENesin (MUCINEX) 600 MG 12 hr tablet Take 1,200 mg by mouth daily with breakfast.     . losartan (COZAAR) 50 MG tablet Take 1  tablet (50 mg total) by mouth daily. (Patient taking differently: Take 50 mg by mouth daily with breakfast.) 90 tablet 0  . metFORMIN (GLUCOPHAGE) 1000 MG tablet Take 1,000 mg by mouth 2 (two) times daily with a meal.    . metoCLOPramide (REGLAN) 10 MG tablet Take 5 mg by mouth 3 (three) times daily before meals.     . Multiple Vitamins-Minerals (CENTRUM SILVER ADULT 50+) TABS Take 1 tablet by mouth daily with breakfast.    . mupirocin ointment (BACTROBAN) 2 % Apply 1 application topically 2 (two) times daily. 30 g 2  . omeprazole (PRILOSEC) 20 MG capsule Take 20 mg by mouth daily.    . OXYGEN 4L of oxygen at night and 3L of oxygen when walking long distances    . SPIRIVA RESPIMAT 2.5 MCG/ACT AERS USE 2 SPRAYS (INHALATIONS)  EVERY MORNING 12 g 3  . vitamin C (ASCORBIC ACID) 250 MG tablet Take 250 mg by mouth daily.     No current facility-administered medications for this visit.     Past Medical History:  Diagnosis Date  . Asthma   . Atrial fibrillation ([place])   . CAD (coronary artery disease)    Prior PTCA  . COPD (chronic obstructive pulmonary disease) ([place])   . Diabetes mellitus   . GERD (gastroesophageal reflux disease)   . Hard of hearing   . Hyperlipidemia   . Hypertension   . lung ca dx'd 01/2006  .  Nephrolithiasis   . Neuropathy   . Osteopenia   . Pulmonary embolus ([place])   . Tibia/fibula fracture 07/2016   Left  . Tremor, essential     Past Surgical History:  Procedure Laterality Date  . Angioplasty  Approx 1993  . CARDIAC CATHETERIZATION  Approximately 1993  . CHOLECYSTECTOMY    . 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 FIXATION KYPHOPLASTY    . LUNG CANCER SURGERY  01/2006   Small excision of left lung tissue; mesh with radioactive seeds (per pt report)  . ORIF TIBIA FRACTURE Left 08/22/2016   Procedure: OPEN REDUCTION INTERNAL FIXATION (ORIF) TIBIA FRACTURE;  Surgeon: Nicholes Stairs, MD;  Location: [facility];  Service: Orthopedics;  Laterality: Left;  . ROTATOR  CUFF TEAR  2016  . Tibia/Fibula Repair Left 07/2016  . TONSILLECTOMY      Social History   Socioeconomic History  . Marital status: Married    Spouse name: Santiago Glad  . Number of children: 2  . Years of education: college  . Highest education level: Not on file  Occupational History    Employer: RETIRED    Comment: Retired from [employer]  Tobacco Use  . Smoking status: Current Every Day Smoker    Packs/day: 0.50    Years: 59.00    Pack years: 29.50    Types: Cigarettes  . Smokeless tobacco: Never Used  Substance and Sexual Activity  . Alcohol use: Not Currently    Alcohol/week: 1.0 standard drink    Types: 1 Glasses of wine per week  . Drug use: Never  . Sexual activity: Never  Other Topics Concern  . Not on file  Social History Narrative   Patient lives at home with his wife Santiago Glad).   Retired.   Education- College    Right handed.   Caffeine- three cups of coffee daily.   Social Determinants of Health   Financial Resource Strain: Not on file  Food Insecurity: Not on file  Transportation Needs: Not on file  Physical Activity: Not on file  Stress: Not on file  Social Connections: Not on file  Intimate Partner Violence: Not on file    Family History  Problem Relation Age of Onset  . Heart disease Mother   . Tremor Mother   . Dementia Mother   . Heart Problems Father     ROS: no fevers or chills, productive cough, hemoptysis, dysphasia, odynophagia, melena, hematochezia, dysuria, hematuria, rash, seizure activity, orthopnea, PND, pedal edema, claudication. Remaining systems are negative.  Physical Exam: Well-developed well-nourished in no acute distress.  Skin is warm and dry.  HEENT is normal.  Neck is supple.  Chest is clear to auscultation with normal expansion.  Cardiovascular exam is irregular Abdominal exam nontender or distended. No masses palpated. Extremities show no edema. neuro grossly intact  A/P  1 permanent atrial fibrillation-patient's  heart rate is controlled on no medications.  Continue apixaban.  Check hemoglobin and renal function.  2 coronary artery disease-patient denies chest pain.  Continue statin.  No aspirin given need for apixaban.  3 hypertension-blood pressure controlled.  Continue present medications and follow.  4 hyperlipidemia-continue statin.  5 tobacco abuse-patient counseled on discontinuing.  Kirk Ruths, MD

## 2021-01-06 ENCOUNTER — Other Ambulatory Visit: Payer: Self-pay

## 2021-01-06 ENCOUNTER — Ambulatory Visit (INDEPENDENT_AMBULATORY_CARE_PROVIDER_SITE_OTHER): Payer: Medicare Other | Admitting: Cardiology

## 2021-01-06 ENCOUNTER — Encounter: Payer: Self-pay | Admitting: Cardiology

## 2021-01-06 VITALS — BP 142/74 | HR 77 | Ht 67.0 in | Wt 209.8 lb

## 2021-01-06 DIAGNOSIS — I10 Essential (primary) hypertension: Secondary | ICD-10-CM | POA: Diagnosis not present

## 2021-01-06 DIAGNOSIS — I4819 Other persistent atrial fibrillation: Secondary | ICD-10-CM

## 2021-01-06 DIAGNOSIS — I2510 Atherosclerotic heart disease of native coronary artery without angina pectoris: Secondary | ICD-10-CM | POA: Diagnosis not present

## 2021-01-06 NOTE — Patient Instructions (Signed)
  Follow-Up: At [facility], you and your health needs are our priority.  As part of our continuing mission to provide you with exceptional heart care, we have created designated Provider Care Teams.  These Care Teams include your primary Cardiologist (physician) and Advanced Practice Providers (APPs -  Physician Assistants and Nurse Practitioners) who all work together to provide you with the care you need, when you need it.  We recommend signing up for the patient portal called "MyChart".  Sign up information is provided on this After Visit Summary.  MyChart is used to connect with patients for Virtual Visits (Telemedicine).  Patients are able to view lab/test results, encounter notes, upcoming appointments, etc.  Non-urgent messages can be sent to your provider as well.   To learn more about what you can do with MyChart, go to [email or web address].    Your next appointment:   6 month(s)  The format for your next appointment:   In Person  Provider:   Kirk Ruths, MD

## 2021-01-07 ENCOUNTER — Other Ambulatory Visit: Payer: Self-pay | Admitting: General Practice

## 2021-01-07 LAB — CBC
Hematocrit: 37.6 % (ref 37.5–51.0)
Hemoglobin: 12.4 g/dL — ABNORMAL LOW (ref 13.0–17.7)
MCH: 26.3 pg — ABNORMAL LOW (ref 26.6–33.0)
MCHC: 33 g/dL (ref 31.5–35.7)
MCV: 80 fL (ref 79–97)
Platelets: 285 10*3/uL (ref 150–450)
RBC: 4.71 x10E6/uL (ref 4.14–5.80)
RDW: 15.7 % — ABNORMAL HIGH (ref 11.6–15.4)
WBC: 8.3 10*3/uL (ref 3.4–10.8)

## 2021-01-07 LAB — BASIC METABOLIC PANEL
BUN/Creatinine Ratio: 26 — ABNORMAL HIGH (ref 10–24)
BUN: 18 mg/dL (ref 8–27)
CO2: 19 mmol/L — ABNORMAL LOW (ref 20–29)
Calcium: 9.5 mg/dL (ref 8.6–10.2)
Chloride: 106 mmol/L (ref 96–106)
Creatinine, Ser: 0.69 mg/dL — ABNORMAL LOW (ref 0.76–1.27)
GFR calc Af Amer: 104 mL/min/{1.73_m2} (ref >59)
GFR calc non Af Amer: 90 mL/min/{1.73_m2} (ref >59)
Glucose: 88 mg/dL (ref 65–99)
Potassium: 4.4 mmol/L (ref 3.5–5.2)
Sodium: 143 mmol/L (ref 134–144)

## 2021-01-09 NOTE — Telephone Encounter (Signed)
Prescription refill request for Eliquis received. Indication: atrial fibrillation Last office visit:2/22 [place] Scr:0.69  2/22 Age: 80 Weight:95.2 kg  Prescription refilled

## 2021-01-23 ENCOUNTER — Ambulatory Visit (INDEPENDENT_AMBULATORY_CARE_PROVIDER_SITE_OTHER): Payer: Medicare Other | Admitting: Podiatry

## 2021-01-23 ENCOUNTER — Other Ambulatory Visit: Payer: Self-pay

## 2021-01-23 DIAGNOSIS — L97522 Non-pressure chronic ulcer of other part of left foot with fat layer exposed: Secondary | ICD-10-CM | POA: Diagnosis not present

## 2021-01-23 DIAGNOSIS — M2142 Flat foot [pes planus] (acquired), left foot: Secondary | ICD-10-CM

## 2021-01-23 DIAGNOSIS — E1142 Type 2 diabetes mellitus with diabetic polyneuropathy: Secondary | ICD-10-CM

## 2021-01-26 NOTE — Progress Notes (Signed)
  Subjective:  Patient ID: [name], adult    DOB: [birth year],  MRN: [id number]  Chief Complaint  Patient presents with  . Foot Ulcer    PT stated that he has pain when he walks where the ulcer is at. They are concerned about the place on the left hallux    80 y.o. adult returns with the above complaint. History confirmed with patient.  He has a new issue with an ulcer on the hallux now as well  Objective:  Physical Exam: warm, good capillary refill, no trophic changes or ulcerative lesions and normal DP and PT pulses. Severe pes planus with prominent TN joint, full-thickness ulcer approximately 0.3 x 0.2 x0.1 dimension with a fibrogranular wound base and mild hyperkeratosis, this is improved significantly with collagen application.  He has a new full-thickness ulceration measuring 6 mm x 3 mm x 2 mm on the distal hallux.  No cellulitis.  Minimal drainage.  No tenderness. Overall improved since last visit           Radiographs left foot: Ulcer noted on plain film radiographs there is no evidence of bony erosion or osteomyelitis, no subcutaneous emphysema noted Assessment:   No diagnosis found.   Plan:  Patient was evaluated and treated and all questions answered.  Patient educated on diabetes. Discussed proper diabetic foot care and discussed risks and complications of disease. Educated patient in depth on reasons to return to the office immediately should he/she discover anything concerning or new on the feet. All questions answered. Discussed proper shoes as well.   Ulcer left foot midfoot and hallux -Debridement as below. -Hallux ulcer while new appears to be stable and should heal with collagen application -Dressed with Prisma collagen dressing, DSD.  Continue this at home every 72 hours, when they run out they can switch back to mupirocin  Procedure: Excisional Debridement of Wound Rationale: Removal of non-viable soft tissue from the wound to promote healing.   Anesthesia: none Pre-Debridement Wound Measurements:  0.3 x 0.2 x0.1 midfoot and left hallux 6 mm x 3 mm x 2 mm Post-Debridement Wound Measurements: Same as predebridement Type of Debridement: Sharp selective Tissue Removed: Non-viable soft tissue Depth of Debridement: subcutaneous tissue. Technique: Sharp selective debridement to bleeding, viable wound base.  Dressing: Dry, sterile, compression dressing. Disposition: Patient tolerated procedure well.      No follow-ups on file.

## 2021-02-06 DIAGNOSIS — R109 Unspecified abdominal pain: Secondary | ICD-10-CM | POA: Diagnosis not present

## 2021-02-13 ENCOUNTER — Ambulatory Visit (INDEPENDENT_AMBULATORY_CARE_PROVIDER_SITE_OTHER): Payer: Medicare Other | Admitting: Podiatry

## 2021-02-13 ENCOUNTER — Other Ambulatory Visit: Payer: Self-pay

## 2021-02-13 DIAGNOSIS — L97522 Non-pressure chronic ulcer of other part of left foot with fat layer exposed: Secondary | ICD-10-CM

## 2021-02-13 DIAGNOSIS — M2142 Flat foot [pes planus] (acquired), left foot: Secondary | ICD-10-CM

## 2021-02-13 DIAGNOSIS — E1142 Type 2 diabetes mellitus with diabetic polyneuropathy: Secondary | ICD-10-CM

## 2021-02-16 ENCOUNTER — Encounter: Payer: Self-pay | Admitting: Podiatry

## 2021-02-16 NOTE — Progress Notes (Signed)
  Subjective:  Patient ID: Paul [name], adult    DOB: 1941-04-16,  MRN: [id number]  No chief complaint on file.   80 y.o. adult returns with the above complaint. History confirmed with patient.  He has a new issue with an ulcer on the hallux now as well  Objective:  Physical Exam: warm, good capillary refill, no trophic changes or ulcerative lesions and normal DP and PT pulses. Severe pes planus with prominent TN joint, full-thickness ulcer approximately 0.3 x 0.2 x0.1 dimension with a fibrogranular wound base and mild hyperkeratosis, this is improved significantly with collagen application.  He has a new full-thickness ulceration measuring 6 mm x 3 mm x 2 mm on the distal hallux.  No cellulitis.  Minimal drainage.  No tenderness. Overall improved since last visit               Radiographs left foot: Ulcer noted on plain film radiographs there is no evidence of bony erosion or osteomyelitis, no subcutaneous emphysema noted Assessment:   1. Ulcer of left foot with fat layer exposed ([place])   2. 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 Pes planovalgus, acquired, left   4. Diabetic peripheral neuropathy associated with type 2 diabetes mellitus ([place])      Plan:  Patient was evaluated and treated and all questions answered.  Patient educated on diabetes. Discussed proper diabetic foot care and discussed risks and complications of disease. Educated patient in depth on reasons to return to the office immediately should he/she discover anything concerning or new on the feet. All questions answered. Discussed proper shoes as well.   Ulcer left foot midfoot and hallux -Debridement as below. -Hallux ulcer while new appears to be stable and should heal with collagen application -Dressed with Prisma collagen dressing, DSD.  Continue this at home every 72 hours, when they run out they can switch back to mupirocin  Procedure: Excisional Debridement of  Wound Rationale: Removal of non-viable soft tissue from the wound to promote healing.  Anesthesia: none Pre-Debridement Wound Measurements:  0.2x 0.1 x0.1 midfoot and left hallux 5 mm x 2 mm x 2 mm Post-Debridement Wound Measurements: Same as predebridement Type of Debridement: Sharp selective Tissue Removed: Non-viable soft tissue Depth of Debridement: subcutaneous tissue. Technique: Sharp selective debridement to bleeding, viable wound base.  Dressing: Dry, sterile, compression dressing. Disposition: Patient tolerated procedure well.      Return in about 3 weeks (around 03/06/2021) for wound left foot .

## 2021-02-20 ENCOUNTER — Other Ambulatory Visit (HOSPITAL_BASED_OUTPATIENT_CLINIC_OR_DEPARTMENT_OTHER): Payer: Self-pay | Admitting: Family Medicine

## 2021-02-20 DIAGNOSIS — R1032 Left lower quadrant pain: Secondary | ICD-10-CM

## 2021-02-20 DIAGNOSIS — E119 Type 2 diabetes mellitus without complications: Secondary | ICD-10-CM | POA: Diagnosis not present

## 2021-02-21 ENCOUNTER — Ambulatory Visit (HOSPITAL_BASED_OUTPATIENT_CLINIC_OR_DEPARTMENT_OTHER)
Admission: RE | Admit: 2021-02-21 | Discharge: 2021-02-21 | Disposition: A | Discharge status: Home / Self Care | Payer: Medicare Other | Source: Ambulatory Visit | Attending: Family Medicine | Admitting: Family Medicine

## 2021-02-21 ENCOUNTER — Other Ambulatory Visit: Payer: Self-pay

## 2021-02-21 DIAGNOSIS — R1032 Left lower quadrant pain: Secondary | ICD-10-CM | POA: Diagnosis not present

## 2021-02-21 LAB — POCT I-STAT CREATININE: Creatinine, Ser: 0.9 mg/dL (ref 0.61–1.24)

## 2021-02-21 MED ORDER — IOHEXOL 300 MG/ML  SOLN
100.0000 mL | Freq: Once | INTRAMUSCULAR | Status: AC | PRN
  Administered 2021-02-21: 100 mL via INTRAVENOUS

## 2021-02-24 DIAGNOSIS — Z23 Encounter for immunization: Secondary | ICD-10-CM | POA: Diagnosis not present

## 2021-03-06 ENCOUNTER — Ambulatory Visit (INDEPENDENT_AMBULATORY_CARE_PROVIDER_SITE_OTHER): Payer: Medicare Other | Admitting: Podiatry

## 2021-03-06 ENCOUNTER — Other Ambulatory Visit: Payer: Self-pay

## 2021-03-06 DIAGNOSIS — L97522 Non-pressure chronic ulcer of other part of left foot with fat layer exposed: Secondary | ICD-10-CM | POA: Diagnosis not present

## 2021-03-06 NOTE — Patient Instructions (Signed)
Add collagen every 3 days and change bandage with padded bandage

## 2021-03-07 ENCOUNTER — Encounter: Payer: Self-pay | Admitting: Podiatry

## 2021-03-07 NOTE — Progress Notes (Signed)
  Subjective:  Patient ID: Paul [name], adult    DOB: 11/15/1940,  MRN: [id number]  Chief Complaint  Patient presents with  . Foot Ulcer     3 weeks  wound left foot    80 y.o. adult returns with the above complaint. History confirmed with patient.    Objective:  Physical Exam: warm, good capillary refill, no trophic changes or ulcerative lesions and normal DP and PT pulses. Severe pes planus with prominent TN joint, full-thickness ulcer approximately 0.1 x 0.1 x0.1 dimension with a fibrogranular wound base and mild hyperkeratosis, this is improved significantly with collagen application.  He has a new full-thickness ulceration measuring 5 mm x 3 mm x 2 mm on the distal hallux.  No cellulitis.  Minimal drainage.  No tenderness. Overall improved since last visit        Radiographs left foot: Ulcer noted on plain film radiographs there is no evidence of bony erosion or osteomyelitis, no subcutaneous emphysema noted Assessment:   1. Ulcer of left foot with fat layer exposed ([place])   2. 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  Patient educated on diabetes. Discussed proper diabetic foot care and discussed risks and complications of disease. Educated patient in depth on reasons to return to the office immediately should he/she discover anything concerning or new on the feet. All questions answered. Discussed proper shoes as well.   Ulcer left foot midfoot and hallux -Debridement as below. -Hallux ulcer while new appears to be stable and should heal with collagen application -Dressed with Prisma collagen dressing, DSD.  Continue this at home every 72 hours, when they run out they can switch back to mupirocin  Procedure: Excisional Debridement of Wound Rationale: Removal of non-viable soft tissue from the wound to promote healing.  Anesthesia: none Post-Debridement Wound  Measurements: 0.1 x 0.1 x0.1 centimeters medial foot, 5 mm x 3 mm x 2 mm on the distal hallux Type of Debridement: Sharp selective Tissue Removed: Non-viable soft tissue Depth of Debridement: subcutaneous tissue. Technique: Sharp selective debridement to bleeding, viable wound base.  Dressing: Dry, sterile, compression dressing. Disposition: Patient tolerated procedure well.      Return in about 3 weeks (around 03/27/2021) for wound care.

## 2021-03-14 ENCOUNTER — Other Ambulatory Visit: Payer: Self-pay

## 2021-03-14 ENCOUNTER — Encounter: Payer: Self-pay | Admitting: Podiatry

## 2021-03-14 ENCOUNTER — Ambulatory Visit (INDEPENDENT_AMBULATORY_CARE_PROVIDER_SITE_OTHER): Payer: Medicare Other | Admitting: Podiatry

## 2021-03-14 DIAGNOSIS — E1165 Type 2 diabetes mellitus with hyperglycemia: Secondary | ICD-10-CM

## 2021-03-14 DIAGNOSIS — M79674 Pain in right toe(s): Secondary | ICD-10-CM | POA: Diagnosis not present

## 2021-03-14 DIAGNOSIS — B351 Tinea unguium: Secondary | ICD-10-CM | POA: Diagnosis not present

## 2021-03-14 DIAGNOSIS — M79675 Pain in left toe(s): Secondary | ICD-10-CM

## 2021-03-14 NOTE — Progress Notes (Signed)
Complaint:  Visit Type: Patient returns to my office for continued preventative foot care services. 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  He is taking eliquis.. The patient presents for preventative foot care services.  He is under treatment for an ulcer left foot  By Dr.  Sherryle Lis.  Podiatric Exam: Vascular: dorsalis pedis  are weakly  palpable bilateral.  Posterior tibial p[ulses are weakly palpable B/L.   Capillary return is immediate. Cold feet noted. Absent digital hair  B/L. Sensorium: Normal Semmes Weinstein monofilament test. Normal tactile sensation bilaterally. Nail Exam: Pt has thick disfigured discolored nails with subungual debris noted bilateral entire nail hallux through fifth toenails Ulcer Exam: There is no evidence of ulcer or pre-ulcerative changes or infection. Orthopedic Exam: Muscle tone and strength are WNL. No limitations in general ROM. No crepitus or effusions noted. Foot type and digits show no abnormalities. Bony prominences are unremarkable. Swelling feet  B/L. Skin: No Porokeratosis. Ulcer left foot.  Ulcer left hallux.  Diagnosis:  Onychomycosis, , Pain in right toe, pain in left toes  Treatment & Plan Procedures and Treatment: Consent by patient was obtained for treatment procedures.   Debridement of mycotic and hypertrophic toenails, 1 through 5 bilateral and clearing of subungual debris. No ulceration, no infection noted.  Return Visit-Office Procedure: Patient instructed to return to the office for a follow up visit 3 months for continued evaluation and treatment.    Gardiner Barefoot DPM

## 2021-03-28 ENCOUNTER — Other Ambulatory Visit: Payer: Self-pay

## 2021-03-28 ENCOUNTER — Ambulatory Visit (INDEPENDENT_AMBULATORY_CARE_PROVIDER_SITE_OTHER): Payer: Medicare Other | Admitting: Podiatry

## 2021-03-28 DIAGNOSIS — E11621 Type 2 diabetes mellitus with foot ulcer: Secondary | ICD-10-CM

## 2021-03-28 DIAGNOSIS — L97422 Non-pressure chronic ulcer of left heel and midfoot with fat layer exposed: Secondary | ICD-10-CM | POA: Diagnosis not present

## 2021-03-28 DIAGNOSIS — L97522 Non-pressure chronic ulcer of other part of left foot with fat layer exposed: Secondary | ICD-10-CM

## 2021-03-28 DIAGNOSIS — E1165 Type 2 diabetes mellitus with hyperglycemia: Secondary | ICD-10-CM

## 2021-04-01 NOTE — Progress Notes (Signed)
  Subjective:  Patient ID: Paul [name], adult    DOB: July 11, 1941,  MRN: [id number]  Chief Complaint  Patient presents with  . Foot Ulcer    Patient denies nausea,vomiting, fever, and chills. No concerns voiced today. Patient feels like the wounds are healing well at this time.     80 y.o. adult returns with the above complaint. History confirmed with patient.    Objective:  Physical Exam: warm, good capillary refill, no trophic changes or ulcerative lesions and normal DP and PT pulses. Severe pes planus with prominent TN joint, full-thickness ulcer approximately 5 x 3 x 1 mm dimension with a fibrogranular wound base and mild hyperkeratosis, .  He has a full-thickness ulceration measuring 5 mm x 3 mm x 1 mm on the distal hallux.  No cellulitis.  Minimal drainage.  No tenderness.            Radiographs left foot: Ulcer noted on plain film radiographs there is no evidence of bony erosion or osteomyelitis, no subcutaneous emphysema noted Assessment:   1. Diabetic ulcer of left midfoot associated with type 2 diabetes mellitus, with fat layer exposed ([place])   2. Uncontrolled type 2 diabetes mellitus with hyperglycemia ([place])   3. Ulcer of left foot with fat layer exposed ([place])   4. 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  Patient educated on diabetes. Discussed proper diabetic foot care and discussed risks and complications of disease. Educated patient in depth on reasons to return to the office immediately should he/she discover anything concerning or new on the feet. All questions answered. Discussed proper shoes as well.   Ulcer left foot midfoot and hallux -Debridement as below. -Dressed with Prisma collagen dressing, DSD.  Continue this at home every 72 hours, when they run out they can switch back to mupirocin -At this point both wounds of stagnated and the wound on the  midfoot has been here for almost 6 months..  I am submitting him for authorization for advanced wound care products including exerciser matrix and tissue with cells   Procedure: Excisional Debridement of Wound Rationale: Removal of non-viable soft tissue from the wound to promote healing.  Anesthesia: none Post-Debridement Wound Measurements: 5 x 3 x 1 mm for both wounds type of Debridement: Sharp selective Tissue Removed: Non-viable soft tissue Depth of Debridement: subcutaneous tissue. Technique: Sharp selective debridement to bleeding, viable wound base.  Dressing: Dry, sterile, compression dressing. Disposition: Patient tolerated procedure well.      Return in about 2 weeks (around 04/11/2021) for wound care.

## 2021-04-04 ENCOUNTER — Ambulatory Visit (INDEPENDENT_AMBULATORY_CARE_PROVIDER_SITE_OTHER): Payer: Medicare Other | Admitting: Neurology

## 2021-04-04 ENCOUNTER — Encounter: Payer: Self-pay | Admitting: Neurology

## 2021-04-04 VITALS — BP 114/61 | HR 64 | Ht 67.0 in | Wt 205.0 lb

## 2021-04-04 DIAGNOSIS — R269 Unspecified abnormalities of gait and mobility: Secondary | ICD-10-CM | POA: Diagnosis not present

## 2021-04-04 DIAGNOSIS — R531 Weakness: Secondary | ICD-10-CM | POA: Diagnosis not present

## 2021-04-04 DIAGNOSIS — G6289 Other specified polyneuropathies: Secondary | ICD-10-CM

## 2021-04-04 DIAGNOSIS — I2510 Atherosclerotic heart disease of native coronary artery without angina pectoris: Secondary | ICD-10-CM | POA: Diagnosis not present

## 2021-04-04 DIAGNOSIS — R413 Other amnesia: Secondary | ICD-10-CM | POA: Diagnosis not present

## 2021-04-04 DIAGNOSIS — R202 Paresthesia of skin: Secondary | ICD-10-CM | POA: Diagnosis not present

## 2021-04-04 NOTE — Progress Notes (Signed)
HISTORICAL  Paul [name] is a 80 year old male, seen in request by his primary care physician Dr. Stephanie Acre, [name] for evaluation of bilateral lower extremity paresthesia, is accompanied by his wife [name] at today's visit on August 04, 2020.  I reviewed and summarized the referring note.  Past medical history Diabetes Lung cancer in 2007, s/p left lobectomy, radial active chips. 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 Atrial fibrillation, on eqliuis  He has a long history of diabetic peripheral neuropathy, and bilateral feet paresthesia, wearing special diabetic shoes, he also reported a history of fall injury around 2017, broke his left leg, requiring surgery, also had a history of lumbar compression fracture in the past, had a kyphoplasty procedure  At baseline, he uses home oxygen for lumbar ambulance, rely on his walker, denies significant neck or low back pain, has urinary urgency, frequency, occasional incontinence  Early September 2021, without clear triggers, he noticed worsening left leg pain, traveling alone left hip, to left thigh, left anterior and posterior leg, left foot felt number, he also have a long history of intermittent bilateral finger paresthesia,   Laboratory evaluation in August 2021, normal TSH, CBC, hemoglobin of 13.6, CMP creatinine of 0.83, LDL 33, vitamin D was 45, A1c was 6.7,  UPDATE Oct 11 2020: He fell recently, had blister at left foot, developed into an ulcer of his left foot, painful and sensitive at times,  EMG/NCS in Sept 2021 showed: Eectrodiagnostic evidence of 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  In addition, there is also evidence of chronic bilateral lumbosacral radiculopathy, involving bilateral L4, L5, S1 myotomes; and right cervical radiculopathy, involving right C5, 6 and 7 myotomes.  I personally reviewed MRI lumbar in Oct 2021: Mildly progressive  disc degeneration at L3-4 with mild spinal and neural foraminal stenosis. Minimal disc bulging at L5-S1 without stenosis. Chronic T12 and L1 compression fractures.  MRI cervical:Mild cervical disc and facet degeneration without compressive stenosis. Normal appearance of the cervical spinal cord.    He now complains of worsening bilateral lower extremity paresthesia, to knee level and recent bilateral finger tips paresthesia, burning sensation. He does not walk much any more, relies on his walker to walk from bedroom to dinning room some.  UPDATE May 24th 2022: He is on anticoagulation, left foot nonhealing wound, still going through wound care, has not tried any treatment, for his worsening neuropathy, gait abnormality, he came in with wheelchair, but at home, he take a few steps with his walker, he can still stand up by the sink 30 to 60 minutes daily to cook, but has to hold off to something maintain standing up position  Personally reviewed MRI of lumbar, multilevel degenerative changes, mild progression, at L3-4 level with mild spinal canal stenosis, neuroforaminal narrowing, chronic T12 and L1 compression fracture,  MRI of cervical spine showed mild cervical disc and facet degenerative disease without significant canal or foraminal narrowing  Extensive laboratory evaluation in September 2021 showed no treatable etiology for his peripheral neuropathy, and specific normal negative CPK, RPR, B12 C-reactive protein ESR, folic acid ANA,  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  ALLERGIES: Allergies  Allergen Reactions  . Codeine Anaphylaxis  . Phenobarbital Hypertension    HOME MEDICATIONS: Current Outpatient Medications  Medication Sig Dispense Refill  .  acetaminophen (TYLENOL) 500 MG  tablet Take 1,000 mg by mouth daily.     Marland Kitchen albuterol (VENTOLIN HFA) 108 (90 Base) MCG/ACT inhaler Inhale 2 puffs into the lungs every 2 (two) hours as needed for wheezing or shortness of breath.     Marland Kitchen atorvastatin (LIPITOR) 40 MG tablet TAKE 1 TABLET BY MOUTH AT  BEDTIME 90 tablet 0  . budesonide-formoterol (SYMBICORT) 160-4.5 MCG/ACT inhaler TAKE 2 PUFFS FIRST THING IN THE MORNING AND THEN  ANOTHER 2 PUFFS ABOUT 12  HOURS LATER. 30.6 g 3  . calcium carbonate (OS-CAL) 600 MG TABS tablet Take by mouth.    . Cholecalciferol (VITAMIN D3) 20 MCG (800 UNIT) TABS 1 tablet    . ELIQUIS 5 MG TABS tablet TAKE 1 TABLET BY MOUTH  TWICE DAILY 180 tablet 3  . fexofenadine (ALLEGRA) 180 MG tablet Take 180 mg by mouth daily.    . fluticasone (FLONASE) 50 MCG/ACT nasal spray Place 1 spray into the nose 2 (two) times daily.    . furosemide (LASIX) 20 MG tablet TAKE 1 TABLET BY MOUTH  DAILY 90 tablet 0  . gabapentin (NEURONTIN) 300 MG capsule 4 at bedtime    . glimepiride (AMARYL) 2 MG tablet Take 2 mg by mouth 2 (two) times daily.     Marland Kitchen guaiFENesin (MUCINEX) 600 MG 12 hr tablet Take 1,200 mg by mouth daily with breakfast.     . losartan (COZAAR) 50 MG tablet Take 1 tablet (50 mg total) by mouth daily. (Patient taking differently: Take 50 mg by mouth daily with breakfast.) 90 tablet 0  . metFORMIN (GLUCOPHAGE) 1000 MG tablet Take 1,000 mg by mouth 2 (two) times daily with a meal.    . metoCLOPramide (REGLAN) 5 MG tablet Take 5 mg by mouth 3 (three) times daily.    . Multiple Vitamins-Minerals (CENTRUM SILVER ADULT 50+) TABS Take 1 tablet by mouth daily with breakfast.    . mupirocin ointment (BACTROBAN) 2 % Apply 1 application topically 2 (two) times daily. 30 g 2  . omeprazole (PRILOSEC OTC) 20 MG tablet 1 tablet    . omeprazole (PRILOSEC) 20 MG capsule Take 20 mg by mouth daily.    . OXYGEN 4L of oxygen at night and 3L of oxygen when walking long distances    . SHINGRIX injection     . SPIRIVA RESPIMAT 2.5  MCG/ACT AERS USE 2 SPRAYS (INHALATIONS)  EVERY MORNING 12 g 3  . vitamin C (ASCORBIC ACID) 250 MG tablet Take 250 mg by mouth daily.     No current facility-administered medications for this visit.    PAST MEDICAL HISTORY: Past Medical History:  Diagnosis Date  . Asthma   . Atrial fibrillation ([place])   . CAD (coronary artery disease)    Prior PTCA  . COPD (chronic obstructive pulmonary disease) ([place])   . Diabetes mellitus   . GERD (gastroesophageal reflux disease)   . Hard of hearing   . Hyperlipidemia   . Hypertension   . lung ca dx'd 01/2006  . Nephrolithiasis   . Neuropathy   . Osteopenia   . Pulmonary embolus ([place])   . Tibia/fibula fracture 07/2016   Left  . Tremor, essential     PAST SURGICAL HISTORY: Past Surgical History:  Procedure Laterality Date  . Angioplasty  Approx 1993  . CARDIAC CATHETERIZATION  Approximately 1993  . CHOLECYSTECTOMY    . 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 FIXATION KYPHOPLASTY    . LUNG CANCER SURGERY  01/2006   Small excision of left lung tissue; mesh with radioactive seeds (per pt report)  . ORIF TIBIA FRACTURE Left 08/22/2016   Procedure: OPEN REDUCTION INTERNAL FIXATION (ORIF) TIBIA FRACTURE;  Surgeon: Nicholes Stairs, MD;  Location: [facility];  Service: Orthopedics;  Laterality: Left;  . ROTATOR CUFF TEAR  2016  . Tibia/Fibula Repair Left 07/2016  . TONSILLECTOMY      FAMILY HISTORY: Family History  Problem Relation Age of Onset  . Heart disease Mother   . Tremor Mother   . Dementia Mother   . Heart Problems Father     SOCIAL HISTORY: Social History   Socioeconomic History  . Marital status: Married    Spouse name: Santiago Glad  . Number of children: 2  . Years of education: college  . Highest education level: Not on file  Occupational History    Employer: RETIRED    Comment: Retired from [employer]  Tobacco Use  . Smoking status: Current Every Day Smoker    Packs/day: 0.50    Years: 59.00    Pack years: 29.50     Types: Cigarettes  . Smokeless tobacco: Never Used  Substance and Sexual Activity  . Alcohol use: Not Currently    Alcohol/week: 1.0 standard drink    Types: 1 Glasses of wine per week  . Drug use: Never  . Sexual activity: Never  Other Topics Concern  . Not on file  Social History Narrative   Patient lives at home with his wife Santiago Glad).   Retired.   Education- College    Right handed.   Caffeine- three cups of coffee daily.   Social Determinants of Health   Financial Resource Strain: Not on file  Food Insecurity: Not on file  Transportation Needs: Not on file  Physical Activity: Not on file  Stress: Not on file  Social Connections: Not on file  Intimate Partner Violence: Not on file     PHYSICAL EXAM   Vitals:   04/04/21 1312  BP: 114/61  Pulse: 64  Weight: 205 lb (93 kg)  Height: _0  (1.702 m)   Not recorded     Body mass index is 32.11 kg/m.  PHYSICAL EXAMNIATION:  Gen: NAD, conversant, well nourised, well groomed                      NEUROLOGICAL EXAM:  MENTAL STATUS: Shortness of breath with minimum exertion Speech cognition: Awake alert, oriented to history taking and casual conversation  Montreal Cognitive Assessment  04/04/2021  Visuospatial/ Executive (0/5) 4  Naming (0/3) 3  Attention: Read list of digits (0/2) 2  Attention: Read list of letters (0/1) 1  Attention: Serial 7 subtraction starting at 100 (0/3) 3  Language: Repeat phrase (0/2) 2  Language : Fluency (0/1) 1  Abstraction (0/2) 2  Delayed Recall (0/5) 2  Orientation (0/6) 6  Total 26     CRANIAL NERVES: CN II: Visual fields are full to confrontation. Pupils are round equal and briskly reactive to light. CN III, IV, VI: extraocular movement are normal. No ptosis. CN V: Facial sensation is intact to light touch CN VII: Face is symmetric with normal eye closure  CN VIII: Hearing is normal to causal conversation. CN IX, X: Phonation is normal. CN XI: Head turning and  shoulder shrug are intact  MOTOR: Moderate bilateral intrinsic hand muscle atrophy, no upper extremity proximal muscle weakness, moderate bilateral finger abduction, grip weakness.  No lower extremity proximal  muscle weakness.  Moderate bilateral ankle dorsiflexion, plantarflexion weakness, left worse than right.    REFLEXES: Areflexia  SENSORY: Length dependent decreased light touch, pinprick to bilateral knee level, decreased finger vibratory sensation.  COORDINATION: There is no trunk or limb dysmetria noted.  GAIT/STANCE: Deferred.   DIAGNOSTIC DATA (LABS, IMAGING, TESTING) - I reviewed patient records, labs, notes, testing and imaging myself where available.   ASSESSMENT AND PLAN  WREN GALLAGA is a 80 y.o. adult   Gait abnormalities Peripheral neuropathy  EMG/NCS in Sept 2021 showed moderate to severe polyneuropathy, with evidence of axonal loss, there are also demyelinating features, as evident by multiple prolonged distal latency, F wave latencies motor nerve conduction study.  He complains of worsening symptoms, with worsening progressive paresthesia and weakness, gait abnormalities, this is also complicated by nonhealing wound at left foot,  Not a candidate for lumbar puncture due to long-term anticoagulation  Will see him in 6 months, after his foot wound healed, if he continue to worsen, may consider a trial of IVIG treatment  Memory loss  Today's MoCA examination is 26/30  Have suggested further evaluation such as MRI of the brain, with his previous abnormal immunofixation protein electrophoresis, repeat laboratory evaluation, he wants to hold off evaluation at this point,   Marcial Pacas, M.D. Ph.D.  [facility] [address] [place], [place] [address] Ph: [phone] Fax: [phone]  CC:  Jonathon Jordan, MD [place],  [place] [address]

## 2021-04-13 ENCOUNTER — Other Ambulatory Visit: Payer: Self-pay

## 2021-04-13 ENCOUNTER — Ambulatory Visit (INDEPENDENT_AMBULATORY_CARE_PROVIDER_SITE_OTHER): Payer: Medicare Other | Admitting: Podiatry

## 2021-04-13 DIAGNOSIS — L97422 Non-pressure chronic ulcer of left heel and midfoot with fat layer exposed: Secondary | ICD-10-CM | POA: Diagnosis not present

## 2021-04-13 DIAGNOSIS — E11621 Type 2 diabetes mellitus with foot ulcer: Secondary | ICD-10-CM | POA: Diagnosis not present

## 2021-04-13 DIAGNOSIS — L97522 Non-pressure chronic ulcer of other part of left foot with fat layer exposed: Secondary | ICD-10-CM

## 2021-04-18 ENCOUNTER — Encounter: Payer: Self-pay | Admitting: Podiatry

## 2021-04-18 NOTE — Progress Notes (Signed)
  Subjective:  Patient ID: Paul [name], adult    DOB: July 12, 1941,  MRN: [id number]  Chief Complaint  Patient presents with  . Foot Ulcer     2wk fu for wound care, skin graft application    80 y.o. adult returns with the above complaint. History confirmed with patient.  Has new ulceration lateral fifth metatarsal base  Objective:  Physical Exam: warm, good capillary refill, no trophic changes or ulcerative lesions and normal DP and PT pulses. 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  He has a full-thickness ulceration measuring 6 mm x 3 mm x 2 mm on the distal hallux.  New full-thickness ulceration 0.8 x 0.4 x 0.2 cm no cellulitis.  Minimal drainage.  No tenderness.                Radiographs left foot: Ulcer noted on plain film radiographs there is no evidence of bony erosion or osteomyelitis, no subcutaneous emphysema noted Assessment:   1. Diabetic ulcer of left midfoot associated with type 2 diabetes mellitus, with fat layer exposed ([place])   2. 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  Patient educated on diabetes. Discussed proper diabetic foot care and discussed risks and complications of disease. Educated patient in depth on reasons to return to the office immediately should he/she discover anything concerning or new on the feet. All questions answered. Discussed proper shoes as well.   Ulcer left foot midfoot and hallux -Debridement as below.  The wound bed of the fifth metatarsal base the largest ulcer was prepared and all devitalized tissue was removed.  PuraPly XT Lot number [id number] 12/14/2001 29.1.1 U0 expiration date 02/22/2022 was then applied followed by Steri-Strips, Mepitel gauze, gauze roll Coban and Ace wrap.   Procedure: Excisional  Debridement of Wound Rationale: Removal of non-viable soft tissue from the wound to promote healing.  Anesthesia: none Post-Debridement Wound Measurements:  5 x 2 x 2 mm medial foot; 6 mm x 3 mm x 2 mm on the distal hallux.  Lateral fifth metatarsal base 0.8 x 0.4 x 0.2 cm  Type of Debridement: Sharp selective Tissue Removed: Non-viable soft tissue Depth of Debridement: subcutaneous tissue. Technique: Sharp selective debridement to bleeding, viable wound base.  Dressing: Dry, sterile, compression dressing. Disposition: Patient tolerated procedure well.      Return in about 1 week (around 04/20/2021) for wound care, grafting.

## 2021-04-20 ENCOUNTER — Ambulatory Visit (INDEPENDENT_AMBULATORY_CARE_PROVIDER_SITE_OTHER): Payer: Medicare Other | Admitting: Podiatry

## 2021-04-20 ENCOUNTER — Encounter: Payer: Self-pay | Admitting: Podiatry

## 2021-04-20 ENCOUNTER — Other Ambulatory Visit: Payer: Self-pay

## 2021-04-20 DIAGNOSIS — L97422 Non-pressure chronic ulcer of left heel and midfoot with fat layer exposed: Secondary | ICD-10-CM | POA: Diagnosis not present

## 2021-04-20 DIAGNOSIS — L97522 Non-pressure chronic ulcer of other part of left foot with fat layer exposed: Secondary | ICD-10-CM

## 2021-04-20 DIAGNOSIS — E11621 Type 2 diabetes mellitus with foot ulcer: Secondary | ICD-10-CM

## 2021-04-20 NOTE — Progress Notes (Signed)
  Subjective:  Patient ID: Paul [name], adult    DOB: 1941-04-26,  MRN: [id number]  Chief Complaint  Patient presents with   Diabetic Ulcer    1 week follow up left foot, skin graft application    80 y.o. adult returns with the above complaint. History confirmed with patient.    Objective:  Physical Exam: warm, good capillary refill, no trophic changes or ulcerative lesions and normal DP and PT pulses. 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  He has a full-thickness ulceration measuring 5 mm x 3 mm x 2 mm on the distal hallux.  Lateral fifth metatarsal base ulceration 5 mm x 3 mm x 1 mm no cellulitis.  Minimal drainage.  All wounds have improved significantly since last visit   Radiographs left foot: Ulcer noted on plain film radiographs there is no evidence of bony erosion or osteomyelitis, no subcutaneous emphysema noted Assessment:   1. Diabetic ulcer of left midfoot associated with type 2 diabetes mellitus, with fat layer exposed ([place])   2. 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  Patient educated on diabetes. Discussed proper diabetic foot care and discussed risks and complications of disease. Educated patient in depth on reasons to return to the office immediately should he/she discover anything concerning or new on the feet. All questions answered. Discussed proper shoes as well.   Ulcer left foot midfoot and hallux -Significant improvement after first application of PuraPly XT -Debridement of all wounds was performed as below.  The wound beds were prepared with all devitalized tissue removed to healthy granular bleeding base.  3 units of Affinity 2.5 x 2.5 cm were applied, 1 each to each wound bed.  Donor ID number [id number] expiration date 04/21/2021.  Dressed  with Steri-Strips, Mepitel, island dressing and Steri-Strips with Coban and Ace wrap.   Procedure: Excisional Debridement of Wound Rationale: Removal of non-viable soft tissue from the wound to promote healing.  Anesthesia: none Post-Debridement Wound Measurements:  4 x 2 x 1 mm medial foot; 5 mm x 3 mm x 2 mmon the distal hallux.  Lateral fifth metatarsal base 5 mm x 3 mm x 1 mm Type of Debridement: Sharp selective Tissue Removed: Non-viable soft tissue Depth of Debridement: subcutaneous tissue. Technique: Sharp selective debridement to bleeding, viable wound base.  Dressing: Dry, sterile, compression dressing. Disposition: Patient tolerated procedure well.      Return in about 1 week (around 04/27/2021) for wound care.

## 2021-04-27 ENCOUNTER — Ambulatory Visit (INDEPENDENT_AMBULATORY_CARE_PROVIDER_SITE_OTHER): Payer: Medicare Other | Admitting: Podiatry

## 2021-04-27 ENCOUNTER — Other Ambulatory Visit: Payer: Self-pay

## 2021-04-27 DIAGNOSIS — E11621 Type 2 diabetes mellitus with foot ulcer: Secondary | ICD-10-CM

## 2021-04-27 DIAGNOSIS — L97422 Non-pressure chronic ulcer of left heel and midfoot with fat layer exposed: Secondary | ICD-10-CM | POA: Diagnosis not present

## 2021-05-01 ENCOUNTER — Encounter: Payer: Self-pay | Admitting: Podiatry

## 2021-05-01 NOTE — Progress Notes (Signed)
  Subjective:  Patient ID: Paul [name], adult    DOB: [birth year],  MRN: [id number]  Chief Complaint  Patient presents with   Foot Ulcer     wound care, grafting- per dr Sherryle Lis    80 y.o. adult returns with the above complaint. History confirmed with patient.    Objective:  Physical Exam: warm, good capillary refill, no trophic changes or ulcerative lesions and normal DP and PT pulses. 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  He has a full-thickness ulceration measuring 4 x 3 x 2 mm on the distal hallux.  Lateral fifth metatarsal base ulceration 4 x 2 x 1 mm no cellulitis.  Minimal drainage.  All wounds have improved significantly since last visit   Radiographs left foot: Ulcer noted on plain film radiographs there is no evidence of bony erosion or osteomyelitis, no subcutaneous emphysema noted Assessment:   1. Diabetic ulcer of left midfoot associated with type 2 diabetes mellitus, with fat layer exposed ([place])       Plan:  Patient was evaluated and treated and all questions answered.  Patient educated on diabetes. Discussed proper diabetic foot care and discussed risks and complications of disease. Educated patient in depth on reasons to return to the office immediately should he/she discover anything concerning or new on the feet. All questions answered. Discussed proper shoes as well.   Ulcer left foot midfoot and hallux -Medial midfoot wound has now healed -Debridement of all wounds was performed as below.  The wound beds were prepared with all devitalized tissue removed to healthy granular bleeding base.  2 sheets of Affinity 2.5 x 2.5 cm were applied, 1 each to each wound bed.  Donor ID number [id number] expiration date 04/28/2021.  Dressed with Steri-Strips, Mepitel, island dressing and Steri-Strips with Coban and Ace wrap.  No wastage of units   Procedure: Excisional Debridement of Wound Rationale: Removal of non-viable soft  tissue from the wound to promote healing.  Anesthesia: none Post-Debridement Wound Measurements: 4 mm x 3 mm x 2 mmon the distal hallux.  Lateral fifth metatarsal base 4 mm x 2 mm x 1 mm Type of Debridement: Sharp selective Tissue Removed: Non-viable soft tissue Depth of Debridement: subcutaneous tissue. Technique: Sharp selective debridement to bleeding, viable wound base.  Dressing: Dry, sterile, compression dressing. Disposition: Patient tolerated procedure well.      No follow-ups on file.

## 2021-05-04 ENCOUNTER — Encounter: Payer: Self-pay | Admitting: Podiatry

## 2021-05-04 ENCOUNTER — Ambulatory Visit (INDEPENDENT_AMBULATORY_CARE_PROVIDER_SITE_OTHER): Payer: Medicare Other | Admitting: Podiatry

## 2021-05-04 ENCOUNTER — Other Ambulatory Visit: Payer: Self-pay

## 2021-05-04 DIAGNOSIS — L97422 Non-pressure chronic ulcer of left heel and midfoot with fat layer exposed: Secondary | ICD-10-CM | POA: Diagnosis not present

## 2021-05-04 DIAGNOSIS — E11621 Type 2 diabetes mellitus with foot ulcer: Secondary | ICD-10-CM | POA: Diagnosis not present

## 2021-05-08 NOTE — Progress Notes (Signed)
  Subjective:  Patient ID: Paul [name], adult    DOB: 1941-05-31,  MRN: [id number]  Chief Complaint  Patient presents with   Wound Check    Left foot graft application    80 y.o. adult returns with the above complaint. History confirmed with patient.    Objective:  Physical Exam: warm, good capillary refill, no trophic changes or ulcerative lesions and normal DP and PT pulses. 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  He has a full-thickness ulceration measuring 4 x 2 x 2 mm on the distal hallux.  Lateral fifth metatarsal base ulceration is now only a small fissure.  Minimal drainage.  All wounds have improved significantly since last visit   Radiographs left foot: Ulcer noted on plain film radiographs there is no evidence of bony erosion or osteomyelitis, no subcutaneous emphysema noted Assessment:   1. Diabetic ulcer of left midfoot associated with type 2 diabetes mellitus, with fat layer exposed ([place])        Plan:  Patient was evaluated and treated and all questions answered.  Patient educated on diabetes. Discussed proper diabetic foot care and discussed risks and complications of disease. Educated patient in depth on reasons to return to the office immediately should he/she discover anything concerning or new on the feet. All questions answered. Discussed proper shoes as well.   Ulcer left foot midfoot and hallux -Medial midfoot wound has now healed -Debridement of all wounds was performed as below.  The wound beds were prepared with all devitalized tissue removed to healthy granular bleeding base.  1 sheet of Affinity 2.5 x 2.5 cm were applied, to the hallux wound.  Donor ID number [id number] expiration date 04/28/2021.  Dressed with Steri-Strips, Mepitel, island dressing and Steri-Strips with Coban and Ace wrap.  No wastage of units   Procedure: Excisional Debridement of Wound Rationale: Removal of non-viable soft tissue from the  wound to promote healing.  Anesthesia: none Post-Debridement Wound Measurements: 4 mm x 2 mm x 2 mmon the distal hallux.  Type of Debridement: Sharp selective Tissue Removed: Non-viable soft tissue Depth of Debridement: subcutaneous tissue. Technique: Sharp selective debridement to bleeding, viable wound base.  Dressing: Dry, sterile, compression dressing. Disposition: Patient tolerated procedure well.      Return in about 1 week (around 8/87/5797) for Graft application to wound.

## 2021-05-11 ENCOUNTER — Ambulatory Visit (INDEPENDENT_AMBULATORY_CARE_PROVIDER_SITE_OTHER): Payer: Medicare Other | Admitting: Podiatry

## 2021-05-11 ENCOUNTER — Other Ambulatory Visit: Payer: Self-pay

## 2021-05-11 DIAGNOSIS — L97422 Non-pressure chronic ulcer of left heel and midfoot with fat layer exposed: Secondary | ICD-10-CM | POA: Diagnosis not present

## 2021-05-11 DIAGNOSIS — E11621 Type 2 diabetes mellitus with foot ulcer: Secondary | ICD-10-CM | POA: Diagnosis not present

## 2021-05-11 NOTE — Progress Notes (Signed)
  Subjective:  Patient ID: [name], adult    DOB: 1941/03/01,  MRN: [id number]  Chief Complaint  Patient presents with   Wound Check    Left foot graft application    80 y.o. adult returns with the above complaint. History confirmed with patient.    Objective:  Physical Exam: warm, good capillary refill, no trophic changes or ulcerative lesions and normal DP and PT pulses. 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  He has a full-thickness ulceration measuring 4 x 2 x 1 mm on the distal hallux.  Lateral fifth metatarsal base ulceration has now fully healed   Radiographs left foot: Ulcer noted on plain film radiographs there is no evidence of bony erosion or osteomyelitis, no subcutaneous emphysema noted Assessment:   1. Diabetic ulcer of left midfoot associated with type 2 diabetes mellitus, with fat layer exposed ([place])        Plan:  Patient was evaluated and treated and all questions answered.  Patient educated on diabetes. Discussed proper diabetic foot care and discussed risks and complications of disease. Educated patient in depth on reasons to return to the office immediately should he/she discover anything concerning or new on the feet. All questions answered. Discussed proper shoes as well.   Ulcer left foot midfoot and hallux -Medial midfoot wound has now healed -Debridement of all wounds was performed as below.  The wound beds were prepared with all devitalized tissue removed to healthy granular bleeding base.  1 sheet of Affinity 2.5 x 2.5 cm were applied, to the hallux wound.  Donor ID number [id number] expiration date 05/12/2021.  Dressed with Steri-Strips, Mepitel, island dressing and Steri-Strips with Coban and Ace wrap.  No wastage of units  -Has appointment for shoe fitting and orthotic adjustment next week.  Will emphasize importance of offloading around the prominent TN and fifth met base   Procedure: Excisional  Debridement of Wound Rationale: Removal of non-viable soft tissue from the wound to promote healing.  Anesthesia: none Post-Debridement Wound Measurements: 4 mm x 2 mm x 1 mm  Type of Debridement: Sharp selective Tissue Removed: Non-viable soft tissue Depth of Debridement: subcutaneous tissue. Technique: Sharp selective debridement to bleeding, viable wound base.  Dressing: Dry, sterile, compression dressing. Disposition: Patient tolerated procedure well.      No follow-ups on file.

## 2021-05-18 ENCOUNTER — Other Ambulatory Visit: Payer: Self-pay

## 2021-05-18 ENCOUNTER — Ambulatory Visit (INDEPENDENT_AMBULATORY_CARE_PROVIDER_SITE_OTHER): Payer: Medicare Other | Admitting: Podiatry

## 2021-05-18 ENCOUNTER — Other Ambulatory Visit: Payer: Medicare Other

## 2021-05-18 DIAGNOSIS — E11621 Type 2 diabetes mellitus with foot ulcer: Secondary | ICD-10-CM

## 2021-05-18 DIAGNOSIS — L97422 Non-pressure chronic ulcer of left heel and midfoot with fat layer exposed: Secondary | ICD-10-CM | POA: Diagnosis not present

## 2021-05-19 ENCOUNTER — Other Ambulatory Visit: Payer: Medicare Other

## 2021-05-21 ENCOUNTER — Encounter: Payer: Self-pay | Admitting: Podiatry

## 2021-05-21 NOTE — Progress Notes (Signed)
  Subjective:  Patient ID: Paul [name], adult    DOB: 10-04-1941,  MRN: [id number]  Chief Complaint  Patient presents with   Wound Check    Left foot graft application    80 y.o. adult returns with the above complaint. History confirmed with patient.    Objective:  Physical Exam: warm, good capillary refill, no trophic changes or ulcerative lesions and normal DP and PT pulses. 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  He has a full-thickness ulceration measuring 4 x 3 x 2 mm on the distal hallux.  Lateral fifth metatarsal base ulceration has now fully healed    Radiographs left foot: Ulcer noted on plain film radiographs there is no evidence of bony erosion or osteomyelitis, no subcutaneous emphysema noted Assessment:   1. Diabetic ulcer of left midfoot associated with type 2 diabetes mellitus, with fat layer exposed ([place])         Plan:  Patient was evaluated and treated and all questions answered.  Patient educated on diabetes. Discussed proper diabetic foot care and discussed risks and complications of disease. Educated patient in depth on reasons to return to the office immediately should he/she discover anything concerning or new on the feet. All questions answered. Discussed proper shoes as well.   Ulcer left foot midfoot and hallux -Medial midfoot wound has now healed -Debridement of all wounds was performed as below.  The wound beds were prepared with all devitalized tissue removed to healthy granular bleeding base.  1 sheet of Affinity 2.5 x 2.5 cm were applied, to the hallux wound.  Donor ID number [id number] expiration date 05/19/2021.  Dressed with  Mepitel, gauze dressing and Steri-Strips  No wastage of units  -Was fitted for new shoes and adjustments to offload lesions within this well plate a bit to help keep these healed   Procedure: Excisional Debridement of Wound Rationale: Removal of non-viable soft tissue from the  wound to promote healing.  Anesthesia: none Post-Debridement Wound Measurements: 4 mm x 3 mm x 2 mm  Type of Debridement: Sharp selective Tissue Removed: Non-viable soft tissue Depth of Debridement: subcutaneous tissue. Technique: Sharp selective debridement to bleeding, viable wound base.  Dressing: Dry, sterile, compression dressing. Disposition: Patient tolerated procedure well.      No follow-ups on file.

## 2021-05-23 ENCOUNTER — Ambulatory Visit: Payer: Medicare Other | Admitting: Internal Medicine

## 2021-05-23 ENCOUNTER — Telehealth: Payer: Self-pay | Admitting: Podiatry

## 2021-05-23 NOTE — Telephone Encounter (Signed)
Faxed documents for custom shoes to pcp.

## 2021-05-25 ENCOUNTER — Ambulatory Visit (INDEPENDENT_AMBULATORY_CARE_PROVIDER_SITE_OTHER): Payer: Medicare Other | Admitting: Podiatry

## 2021-05-25 ENCOUNTER — Other Ambulatory Visit: Payer: Self-pay

## 2021-05-25 DIAGNOSIS — E11621 Type 2 diabetes mellitus with foot ulcer: Secondary | ICD-10-CM | POA: Diagnosis not present

## 2021-05-25 DIAGNOSIS — L97422 Non-pressure chronic ulcer of left heel and midfoot with fat layer exposed: Secondary | ICD-10-CM

## 2021-05-29 NOTE — Progress Notes (Signed)
  Subjective:  Patient ID: Paul [name], adult    DOB: 1941/03/13,  MRN: [id number]  Chief Complaint  Patient presents with   Wound Check    Left foot graft application    80 y.o. adult returns with the above complaint. History confirmed with patient.    Objective:  Physical Exam: warm, good capillary refill, no trophic changes or ulcerative lesions and normal DP and PT pulses. 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  He has a full-thickness ulceration measuring 1.1 x 0.7 x 0.2 cm postdebridement on the distal hallux.  Aggressive debridement today to eliminate rolled edges of the skin to allow epithelialization.  Lateral fifth metatarsal base ulceration has now fully healed     Radiographs left foot: Ulcer noted on plain film radiographs there is no evidence of bony erosion or osteomyelitis, no subcutaneous emphysema noted Assessment:   1. Diabetic ulcer of left midfoot associated with type 2 diabetes mellitus, with fat layer exposed ([place])         Plan:  Patient was evaluated and treated and all questions answered.  Patient educated on diabetes. Discussed proper diabetic foot care and discussed risks and complications of disease. Educated patient in depth on reasons to return to the office immediately should he/she discover anything concerning or new on the feet. All questions answered. Discussed proper shoes as well.   Ulcer left foot midfoot and hallux -Medial midfoot wound has now healed -Debridement of all wounds was performed as below.  The wound beds were prepared with all devitalized tissue removed to healthy granular bleeding base.  1 sheet of Affinity 2.5 x 2.5 cm were applied, to the hallux wound.  Donor ID number [id number] expiration date 05/26/2021.  Dressed with  Mepitel, gauze dressing and Steri-Strips  No wastage of units  -Was fitted for new shoes and adjustments to offload lesions within this well plate a bit to help  keep these healed   Procedure: Excisional Debridement of Wound Rationale: Removal of non-viable soft tissue from the wound to promote healing.  Anesthesia: none Post-Debridement Wound Measurements: 1.1 x 0.7 x 0.2 cm Type of Debridement: Sharp selective Tissue Removed: Non-viable soft tissue Depth of Debridement: subcutaneous tissue. Technique: Sharp selective debridement to bleeding, viable wound base.  Dressing: Dry, sterile, compression dressing. Disposition: Patient tolerated procedure well.      Return in about 1 week (around 9/92/7800) for Graft application to wound.

## 2021-06-01 ENCOUNTER — Ambulatory Visit (INDEPENDENT_AMBULATORY_CARE_PROVIDER_SITE_OTHER): Payer: Medicare Other | Admitting: Podiatry

## 2021-06-01 ENCOUNTER — Other Ambulatory Visit: Payer: Self-pay

## 2021-06-01 DIAGNOSIS — E11621 Type 2 diabetes mellitus with foot ulcer: Secondary | ICD-10-CM

## 2021-06-01 DIAGNOSIS — L97422 Non-pressure chronic ulcer of left heel and midfoot with fat layer exposed: Secondary | ICD-10-CM

## 2021-06-02 ENCOUNTER — Telehealth: Payer: Self-pay | Admitting: Podiatry

## 2021-06-02 NOTE — Telephone Encounter (Signed)
Diabetic shoe documents from pcp states last office visit was 9.9.21 and it has to be within 6 months.   I called and spoke to pts wife and she is calling the pcp to get an appt and I asked her to let me know and I will refax paperwork for the appt.

## 2021-06-02 NOTE — Telephone Encounter (Signed)
Pts wife called back and pt is scheduled to see pcp on 8.10.2022. I told her I would fax documents over with the appt date on them.Marland Kitchen

## 2021-06-06 ENCOUNTER — Ambulatory Visit (INDEPENDENT_AMBULATORY_CARE_PROVIDER_SITE_OTHER): Payer: Medicare Other

## 2021-06-06 ENCOUNTER — Ambulatory Visit (INDEPENDENT_AMBULATORY_CARE_PROVIDER_SITE_OTHER): Payer: Medicare Other | Admitting: Internal Medicine

## 2021-06-06 ENCOUNTER — Encounter: Payer: Self-pay | Admitting: Internal Medicine

## 2021-06-06 ENCOUNTER — Other Ambulatory Visit: Payer: Self-pay

## 2021-06-06 DIAGNOSIS — F17210 Nicotine dependence, cigarettes, uncomplicated: Secondary | ICD-10-CM | POA: Diagnosis not present

## 2021-06-06 DIAGNOSIS — I2510 Atherosclerotic heart disease of native coronary artery without angina pectoris: Secondary | ICD-10-CM | POA: Diagnosis not present

## 2021-06-06 DIAGNOSIS — J9611 Chronic respiratory failure with hypoxia: Secondary | ICD-10-CM | POA: Diagnosis not present

## 2021-06-06 DIAGNOSIS — J449 Chronic obstructive pulmonary disease, unspecified: Secondary | ICD-10-CM | POA: Diagnosis not present

## 2021-06-06 DIAGNOSIS — R059 Cough, unspecified: Secondary | ICD-10-CM | POA: Diagnosis not present

## 2021-06-06 MED ORDER — ALBUTEROL SULFATE (2.5 MG/3ML) 0.083% IN NEBU: 2.5000 mg | INHALATION_SOLUTION | Freq: Four times a day (QID) | RESPIRATORY_TRACT | 12 refills | Status: DC | PRN

## 2021-06-06 MED ORDER — AMOXICILLIN-POT CLAVULANATE 875-125 MG PO TABS: 1.0000 | ORAL_TABLET | Freq: Two times a day (BID) | ORAL | 0 refills | Status: AC

## 2021-06-06 NOTE — Progress Notes (Signed)
Subjective:    Patient ID: Paul [name], male    DOB: Aug 10, 1941  MRN: [id number]    Brief patient profile:  26  yowm  active smoker with dx of copd with worse symptoms since 2013 referred to pulmonary clinic 12/24/2012 by Dr Paul [name] for COPD evaluation with GOLD II criteria May 2014    History of Present Illness  12/24/2012 1st pulmonary eval  On advair/ spiriva longterm with freq use of proventil prior to dx  PE presenting as pleurtic L CP May 2013 with "moderate clot burden" to  [facility]oc with PAF and started on Xarelto and baseline doe x climbing steps but could walk flat all day long before and after PE but then indolent onset doe progressively worse x 9 months assoc with cough congestion > green mucus better p on levaquin x one week but convinced his coughing and breathing problems are all due to xarelto because of what he read on the package. rec Only use your albuterol (proaire) as a rescue medication (plan B)  to be used if you can't catch your breath   The key is to stop smoking completely before smoking completely stops you           08/05/2017  f/u ov/Paul [name] re:   COPD II/ still smoking < 10 per day maint symb/spiriva  Chief Complaint  Patient presents with   Follow-up    Breathing is overall doing well. He states he is coughing more since he has been home from the nursing home. His cough is prod with brown sputum. He is using his proair 4 x daily on average.   symbicort 160/spiriva 2.5  Worse breathing when not at home = stuffy nose cough not an issue while living at rehab  Convinced it's the formaldehyde in the house and only smokes outside  preferes allegra over zyrtec  MMRC3 = can't walk 100 yards even at a slow pace at a flat grade s stopping due to sob  / 02 4lpm hs  Really no change doe or cough x last year unless leaves house, then improved  02 4lpm hs/ no am ha/ feels sleeps well  rec The key is to stop smoking completely before smoking completely stops you!   Breathe clear air       05/23/2020  f/u ov/Paul [name] re: copd GOLD II/ still smoking / seocnd pfizer 01/12/20 Misunderstood dosing on both symb and spiriva (using half the rec amt)  Chief Complaint  Patient presents with   Follow-up  Dyspnea:  Room to room or [facility] to building stops half way using walking  Cough: more productive since last ov Sleeping: ok bed is flat/ wedge to 30 degrees  SABA use: never prechallenges/ only uses p ex  02: 4lpm concentrator sleeping / nothing at rest / back on 4lpm with activity  Rec Make sure you check your oxygen saturations at highest level of activity to be sure it stays over 90% and adjust upward to maintain this level if needed but remember to turn it back to previous settings when you stop (to conserve your supply).  Plan A = Automatic = Always=  Symbicort 160 Take 2 puffs first thing in am and then another 2 puffs about 12 hours later  And spiriva 2puffs thing in am  Plan B = Backup (to supplement plan A, not to replace it) Only use your albuterol inhaler as a rescue medication  Please schedule a follow up visit in 12 months but call  sooner if needed    06/06/2021  f/u ov/Paul [name] re: COPD GOLD II /  still smoking  Chief Complaint  Patient presents with   Shortness of Breath    Pt reports he will be unable to "walk" for oxygen sat measurements today as he feels unsteady on his feet and says "I can't go very far even with my walker"'. Arrives with left foot boot/shoe that does not extend past ankle.    Dyspnea:  room to room and neuropathy / leg ulcers  Cough: more green x 6 months /rattly esp in am  Sleeping: wedge at 30 degrees  SABA use: 6 x daily  02: 3lpm at rest/ 4lpm hs and 4lpm walking not checking  Covid status:   x 4    No obvious day to day or daytime variability or assoc mucus plugs or hemoptysis or cp or chest tightness, subjective wheeze or overt sinus or hb symptoms.    Also denies any obvious fluctuation of symptoms with weather or  environmental changes or other aggravating or alleviating factors except as outlined above   No unusual exposure hx or h/o childhood pna/ asthma or knowledge of premature birth.  Current Allergies, Complete Past Medical History, Past Surgical History, Family History, and Social History were reviewed in [employer] record.  ROS  The following are not active complaints unless bolded Hoarseness, sore throat, dysphagia, dental problems, itching, sneezing,  nasal congestion or discharge of excess mucus or purulent secretions, ear ache,   fever, chills, sweats, unintended wt loss or wt gain, classically pleuritic or exertional cp,  orthopnea pnd or arm/hand swelling  or leg swelling, presyncope, palpitations, abdominal pain, anorexia, nausea, vomiting, diarrhea  or change in bowel habits or change in bladder habits, change in stools or change in urine, dysuria, hematuria,  rash, arthralgias, visual complaints, headache, numbness, weakness or ataxia or problems with walking or coordination,  change in mood or  memory.        Current Meds  Medication Sig   acetaminophen (TYLENOL) 500 MG tablet Take 1,000 mg by mouth daily.    albuterol (VENTOLIN HFA) 108 (90 Base) MCG/ACT inhaler Inhale 2 puffs into the lungs every 2 (two) hours as needed for wheezing or shortness of breath.    amoxicillin-clavulanate (AUGMENTIN) 875-125 MG tablet Take 1 tablet by mouth 2 (two) times daily for 10 days.   atorvastatin (LIPITOR) 40 MG tablet TAKE 1 TABLET BY MOUTH AT  BEDTIME   budesonide-formoterol (SYMBICORT) 160-4.5 MCG/ACT inhaler TAKE 2 PUFFS FIRST THING IN THE MORNING AND THEN  ANOTHER 2 PUFFS ABOUT 12  HOURS LATER.   calcium carbonate (OS-CAL) 600 MG TABS tablet Take by mouth.   Cholecalciferol (VITAMIN D3) 20 MCG (800 UNIT) TABS 1 tablet   ELIQUIS 5 MG TABS tablet TAKE 1 TABLET BY MOUTH  TWICE DAILY   fexofenadine (ALLEGRA) 180 MG tablet Take 180 mg by mouth daily.   fluticasone (FLONASE) 50  MCG/ACT nasal spray Place 1 spray into the nose 2 (two) times daily.   furosemide (LASIX) 20 MG tablet TAKE 1 TABLET BY MOUTH  DAILY   gabapentin (NEURONTIN) 300 MG capsule 4 at bedtime   glimepiride (AMARYL) 2 MG tablet Take 2 mg by mouth 2 (two) times daily.    guaiFENesin (MUCINEX) 600 MG 12 hr tablet Take 1,200 mg by mouth daily with breakfast.    losartan (COZAAR) 50 MG tablet Take 1 tablet (50 mg total) by mouth daily. (Patient taking differently: Take 50  mg by mouth daily with breakfast.)   metFORMIN (GLUCOPHAGE) 1000 MG tablet Take 1,000 mg by mouth 2 (two) times daily with a meal.   metoCLOPramide (REGLAN) 5 MG tablet Take 5 mg by mouth 3 (three) times daily.   Multiple Vitamins-Minerals (CENTRUM SILVER ADULT 50+) TABS Take 1 tablet by mouth daily with breakfast.   omeprazole (PRILOSEC OTC) 20 MG tablet 1 tablet   omeprazole (PRILOSEC) 20 MG capsule Take 20 mg by mouth daily.   OXYGEN 4L of oxygen at night and 3L of oxygen when walking long distances   SHINGRIX injection    SPIRIVA RESPIMAT 2.5 MCG/ACT AERS USE 2 SPRAYS (INHALATIONS)  EVERY MORNING   vitamin C (ASCORBIC ACID) 250 MG tablet Take 250 mg by mouth daily.                         Objective:   Physical Exam    06/06/2021    203 05/23/2020   220  02/05/2013  Wt 266 > 03/26/2013  269 > 06/21/2014 261 > 08/04/2014  256 >257 03/07/2015 > 09/05/2015  254 > 01/26/2016  250 > 08/01/2016  248 > 01/29/2017   250   > 05/01/2017   248  > 08/05/2017   249 > 12/05/2017   235  > 07/01/2018   222   Vital signs reviewed  06/06/2021  - Note at rest 02 sats  100% on 3lpm    General appearance:    chronically ill w/c bound somber wm     HEENT : pt wearing mask not removed for exam due to covid -19 concerns.    NECK :  without JVD/Nodes/TM/ nl carotid upstrokes bilaterally   LUNGS: no acc muscle use,  Mod barrel  contour chest wall with bilateral  Distant bs s audible wheeze and  without cough on insp or exp maneuvers and mod   Hyperresonant  to  percussion bilaterally     CV:  RRR  no s3 or murmur or increase in P2, and no edema   ABD:  soft and nontender with pos mid insp Hoover's  No bruits or organomegaly appreciated, bowel sounds nl  MS:     ext warm without deformities, calf tenderness, cyanosis or clubbing No obvious joint restrictions   SKIN: warm and dry without lesions    NEURO:  alert, approp, nl sensorium with  no motor or cerebellar deficits apparent.             CXR PA and Lateral:   06/06/2021 :    I personally reviewed images and agree with radiology impression as follows:   Chronic interstitial changes again noted. Mild infiltrate right upper lung cannot be excluded.         Assessment & Plan:

## 2021-06-06 NOTE — Patient Instructions (Addendum)
Plan A = Automatic = Always=    Symbicort 160 Take 2 puffs first thing in am and then another 2 puffs about 12 hours later. Try  off spiriva x 3 - 5 days to see what changes you notice especially your bowel or bladder  Work on inhaler technique:  relax and gently blow all the way out then take a nice smooth full deep breath back in, triggering the inhaler at same time you start breathing in.  Hold for up to 5 seconds if you can. Blow out thru nose. Rinse and gargle with water when done.  If mouth or throat bother you at all,  try brushing teeth/gums/tongue with arm and hammer toothpaste/ make a slurry and gargle and spit out.     Plan B = Backup (to supplement plan A, not to replace it) Only use your albuterol inhaler as a rescue medication to be used if you can't catch your breath by resting or doing a relaxed purse lip breathing pattern.  - The less you use it, the better it will work when you need it. - Ok to use the inhaler up to 2 puffs  every 4 hours if you must but call for appointment if use goes up over your usual need - Don't leave home without it !!  (think of it like the spare tire for your car)   Plan C = Crisis (instead of Plan B but only if Plan B stops working) - only use your albuterol nebulizer if you first try Plan B and it fails to help > ok to use the nebulizer up to every 4 hours but if start needing it regularly call for immediate appointment  Augmentin 875 mg take one pill twice daily  X 10 days - take at breakfast and supper with large glass of water.  It would help reduce the usual side effects (diarrhea and yeast infections) if you ate cultured yogurt at lunch.     Please remember to go to the  x-ray department  for your tests - we will call you with the results when they are available     Please schedule a follow up office visit in 4 weeks, sooner if needed

## 2021-06-06 NOTE — Progress Notes (Signed)
  Subjective:  Patient ID: Paul [name], adult    DOB: [birth year],  MRN: [id number]  Chief Complaint  Patient presents with   Wound Check    Left foot graft application    80 y.o. adult returns with the above complaint. History confirmed with patient.    Objective:  Physical Exam: warm, good capillary refill, no trophic changes or ulcerative lesions and normal DP and PT pulses. 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  He has a full-thickness ulceration measuring 0.9 x 0.7 x 0.2 cm postdebridement on the distal hallux.  .  Lateral fifth metatarsal base ulceration has now fully healed       Radiographs left foot: Ulcer noted on plain film radiographs there is no evidence of bony erosion or osteomyelitis, no subcutaneous emphysema noted Assessment:   No diagnosis found.       Plan:  Patient was evaluated and treated and all questions answered.  Patient educated on diabetes. Discussed proper diabetic foot care and discussed risks and complications of disease. Educated patient in depth on reasons to return to the office immediately should he/she discover anything concerning or new on the feet. All questions answered. Discussed proper shoes as well.   Ulcer left foot midfoot and hallux -Medial midfoot wound has now healed -Debridement of all wounds was performed as below.  The wound beds were prepared with all devitalized tissue removed to healthy granular bleeding base.  1 sheet of Affinity 2.5 x 2.5 cm were applied, to the hallux wound.  Donor ID number [id number] expiration date 06/02/2021.  Dressed with  Mepitel, gauze dressing and Steri-Strips  No wastage of units  -Was fitted for new shoes and adjustments to offload lesions within this well plate a bit to help keep these healed   Procedure: Excisional Debridement of Wound Rationale: Removal of non-viable soft tissue from the wound to promote healing.  Anesthesia:  none Post-Debridement Wound Measurements: 0.9 x 0.7 x 0.2 Type of Debridement: Sharp selective Tissue Removed: Non-viable soft tissue Depth of Debridement: subcutaneous tissue. Technique: Sharp selective debridement to bleeding, viable wound base.  Dressing: Dry, sterile, compression dressing. Disposition: Patient tolerated procedure well.      Return in about 1 week (around 9/98/7215) for Graft application to wound.

## 2021-06-07 ENCOUNTER — Encounter: Payer: Self-pay | Admitting: Internal Medicine

## 2021-06-07 NOTE — Assessment & Plan Note (Addendum)
Active smoker    - PFT's 03/12/13 FEV1  2.27 (79%) ratio 52 and no better p B2 DLCO 45 and 60% p correction - Ex desats documented 06/21/14 > refused 02 > agreed to accept 03/26/15 see chronic resp failure  - 01/26/2016  rx symbicort instead of advair - PFT's  08/01/2016  FEV1 2.03  (76 % ) ratio 57  p 8 % improvement from saba p symbicort 160 x one puff  prior to study with DLCO  37/39 % corrects to 42  % for alv volume  > increase symb 160 2bid  - added spiriva [facility]  01/29/2017    - 06/06/2021  After extensive coaching inhaler device,  effectiveness =    90%  Acute flare 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  It would help reduce the usual side effects (diarrhea and yeast infections) if you ate cultured yogurt at lunch.   F/u with cxr in 4 weeks          Each maintenance medication was reviewed in detail including emphasizing most importantly the difference between maintenance and prns and under what circumstances the prns are to be triggered using an action plan format where appropriate.  Total time for H and P, chart review, counseling, reviewing hfa/02 device(s) and generating customized AVS unique to this office visit / same day charting > 30 min

## 2021-06-07 NOTE — Assessment & Plan Note (Signed)
Counseled re importance of smoking cessation but did not meet time criteria for separate billing   °

## 2021-06-07 NOTE — Assessment & Plan Note (Signed)
04/05/15 SATURATION QUALIFICATIONS: (This note is used to comply with regulatory documentation for home oxygen) Patient Saturations on Room Air at Rest = 95% Patient Saturations on Room Air while Ambulating = 88% Patient Saturations on 2 Liters of oxygen while Ambulating = 93% Please briefly explain why patient needs home oxygen: desats with ambulation - 01/26/2016  Patient Saturations on Room Air at Rest =83%--increased to 93% 3lpm pulsed o2 - 05/10/17  Repeat ono on 3lpm >  33 min desat at < 89% so 05/17/2017  rec  4lpm and repeat ono on 4lpm  - ONO on 3lpm 05/23/17  desat 14 m @ <89% rec 4lpm as prev instructed .> repeated on 4lpm 06/05/17 and still 5 m at < 89% so rec continue 4lpm / consider sleep study next ov  Adequate control on present rx, reviewed in detail with pt > no change in rx needed    Advised: Make sure you check your oxygen saturation  at your highest level of activity  to be sure it stays over 90% and adjust  02 flow upward to maintain this level if needed but remember to turn it back to previous settings when you stop (to conserve your supply).

## 2021-06-07 NOTE — Progress Notes (Signed)
Spoke with pt's spouse, Santiago Glad (ok per [facility]) and notified of results per Dr. Melvyn Novas. Pt verbalized understanding and denied any questions.

## 2021-06-08 ENCOUNTER — Ambulatory Visit (INDEPENDENT_AMBULATORY_CARE_PROVIDER_SITE_OTHER): Payer: Medicare Other | Admitting: Podiatry

## 2021-06-08 ENCOUNTER — Other Ambulatory Visit: Payer: Self-pay

## 2021-06-08 DIAGNOSIS — E11621 Type 2 diabetes mellitus with foot ulcer: Secondary | ICD-10-CM

## 2021-06-08 DIAGNOSIS — L97422 Non-pressure chronic ulcer of left heel and midfoot with fat layer exposed: Secondary | ICD-10-CM | POA: Diagnosis not present

## 2021-06-11 NOTE — Progress Notes (Signed)
  Subjective:  Patient ID: Paul [name], adult    DOB: Oct 22, 1941,  MRN: [id number]  Chief Complaint  Patient presents with   Wound Check    Left foot graft application    80 y.o. adult returns with the above complaint. History confirmed with patient.    Objective:  Physical Exam: warm, good capillary refill, no trophic changes or ulcerative lesions and normal DP and PT pulses. 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  He has a full-thickness ulceration measuring 0.5 x 0.8 x 0.2 cm postdebridement on the distal hallux.  .  Lateral fifth metatarsal base ulceration has now fully healed        Radiographs left foot: Ulcer noted on plain film radiographs there is no evidence of bony erosion or osteomyelitis, no subcutaneous emphysema noted Assessment:   1. Diabetic ulcer of left midfoot associated with type 2 diabetes mellitus, with fat layer exposed ([facility])          Plan:  Patient was evaluated and treated and all questions answered.  Patient educated on diabetes. Discussed proper diabetic foot care and discussed risks and complications of disease. Educated patient in depth on reasons to return to the office immediately should he/she discover anything concerning or new on the feet. All questions answered. Discussed proper shoes as well.   Ulcer left foot midfoot and hallux -Medial midfoot wound has now healed -Debridement of all wounds was performed as below.  The wound beds were prepared with all devitalized tissue removed to healthy granular bleeding base.  1 sheet of Affinity 2.5 x 2.5 cm were applied, to the hallux wound.  Donor ID number [id number] expiration date 06/09/2021.  Dressed with  Mepitel, gauze dressing and Steri-Strips  No wastage of units  -Was fitted for new shoes and adjustments to offload lesions within this well plate a bit to help keep these healed   Procedure: Excisional Debridement of Wound Rationale: Removal  of non-viable soft tissue from the wound to promote healing.  Anesthesia: none Post-Debridement Wound Measurements: 0.5 x 0.8 x 0.3 Type of Debridement: Sharp selective Tissue Removed: Non-viable soft tissue Depth of Debridement: subcutaneous tissue. Technique: Sharp selective debridement to bleeding, viable wound base.  Dressing: Dry, sterile, compression dressing. Disposition: Patient tolerated procedure well.      No follow-ups on file.

## 2021-06-15 ENCOUNTER — Other Ambulatory Visit: Payer: Self-pay

## 2021-06-15 ENCOUNTER — Ambulatory Visit (INDEPENDENT_AMBULATORY_CARE_PROVIDER_SITE_OTHER): Payer: Medicare Other | Admitting: Podiatry

## 2021-06-15 DIAGNOSIS — L97422 Non-pressure chronic ulcer of left heel and midfoot with fat layer exposed: Secondary | ICD-10-CM | POA: Diagnosis not present

## 2021-06-15 DIAGNOSIS — E11621 Type 2 diabetes mellitus with foot ulcer: Secondary | ICD-10-CM

## 2021-06-19 ENCOUNTER — Telehealth: Payer: Self-pay | Admitting: Internal Medicine

## 2021-06-19 NOTE — Telephone Encounter (Signed)
Patient's wife Helene Kelp [facility]) returned call, advised her of the recommendations per Dr. Melvyn Novas.  Reviewed ABC plan on AVS.  She states that he refuses to use the Symbicort and Spiriva.  He also continues to smoke.  His cough was better while he was on the Augmentin, but on Saturday the cough got bad again.  Advised that if he gets worse to go to the ER and if she wants to attempted to move up his f/u appointment to let us know.  She will attempt to get him to use the inhalers and use the musinex DM per Dr. Melvyn Novas.  She verbalized understanding. Nothing further needed.

## 2021-06-19 NOTE — Progress Notes (Signed)
  Subjective:  Patient ID: Paul [name], adult    DOB: [birth year],  MRN: [id number]  Chief Complaint  Patient presents with   Wound Check    Left foot graft application    80 y.o. adult returns with the above complaint. History confirmed with patient.    Objective:  Physical Exam: warm, good capillary refill, no trophic changes or ulcerative lesions and normal DP and PT pulses. 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  He has a full-thickness ulceration measuring 0.5 x 0.6 x 0.3 cm postdebridement on the distal hallux.  .  Lateral fifth metatarsal base ulceration has now fully healed         Radiographs left foot: Ulcer noted on plain film radiographs there is no evidence of bony erosion or osteomyelitis, no subcutaneous emphysema noted Assessment:   1. Diabetic ulcer of left midfoot associated with type 2 diabetes mellitus, with fat layer exposed ([place])           Plan:  Patient was evaluated and treated and all questions answered.  Patient educated on diabetes. Discussed proper diabetic foot care and discussed risks and complications of disease. Educated patient in depth on reasons to return to the office immediately should he/she discover anything concerning or new on the feet. All questions answered. Discussed proper shoes as well.   Ulcer left foot midfoot and hallux -Medial midfoot wound has now healed -Debridement of all wounds was performed as below.  The wound beds were prepared with all devitalized tissue removed to healthy granular bleeding base.  1 sheet of Affinity 2.5 x 2.5 cm were applied, to the hallux wound to provide viable stem cells for epithelialization.  Donor ID number [id number] expiration date 06/16/2021.  Dressed with  Mepitel, gauze dressing and Steri-Strips  No wastage of units    Procedure: Excisional Debridement of Wound Rationale: Removal of non-viable soft tissue from the wound to promote healing.   Anesthesia: none Post-Debridement Wound Measurements: 0.5 x 0.6 x 0.3 Type of Debridement: Sharp selective Tissue Removed: Non-viable soft tissue Depth of Debridement: subcutaneous tissue. Technique: Sharp selective debridement to bleeding, viable wound base.  Dressing: Dry, sterile, compression dressing. Disposition: Patient tolerated procedure well.      Return in about 1 week (around 06/22/2021) for wound care.

## 2021-06-19 NOTE — Telephone Encounter (Signed)
Need to go over the last avs again including ABC plan and emphasize if he can't or won't follow the instructions there's nothing else I can do better until returns with all meds to regroup  For cough /congestion mucinex dm 1200 bid otc.  If getting worse after following the instructions to the letter then needs to go er, o/w we'll just see him as scheduled on 8/23 unless someone else can see him sooner

## 2021-06-19 NOTE — Telephone Encounter (Signed)
Tried calling pt and there was no answer- LMTCB.

## 2021-06-19 NOTE — Telephone Encounter (Signed)
Call returned to patient, spoke with wife, confirmed DOB. Patient wife reports patient has a cough with green mucous. Patient had pneumonia back in July. The patient took the last antibiotic (augmentin) Friday. Denies fevers, chills, body aches. Reports oxygen ranges from 88% to 95%. They still are awaiting the nebulizer machine. He is not using the symbicort at all due to side effects that the wife was unable to voice what they were. He has been using 4L of oxygen with ambulation and exertion, he does not wear it all the time. Report being SOB with minimal effort. He is congested having to blow his nose several times throughout the day, per the wife in their 23 years of marriage he has never had a runny nose. They have not used anything OTC to address congestion or cough. Also confirmed he is not using the albuterol inhaler either.   MW please advise. Thanks.

## 2021-06-21 DIAGNOSIS — Z79899 Other long term (current) drug therapy: Secondary | ICD-10-CM | POA: Diagnosis not present

## 2021-06-21 DIAGNOSIS — Z7984 Long term (current) use of oral hypoglycemic drugs: Secondary | ICD-10-CM | POA: Diagnosis not present

## 2021-06-21 DIAGNOSIS — F330 Major depressive disorder, recurrent, mild: Secondary | ICD-10-CM | POA: Diagnosis not present

## 2021-06-21 DIAGNOSIS — I4891 Unspecified atrial fibrillation: Secondary | ICD-10-CM | POA: Diagnosis not present

## 2021-06-21 DIAGNOSIS — E1143 Type 2 diabetes mellitus with diabetic autonomic (poly)neuropathy: Secondary | ICD-10-CM | POA: Diagnosis not present

## 2021-06-21 DIAGNOSIS — Z85118 Personal history of other malignant neoplasm of bronchus and lung: Secondary | ICD-10-CM | POA: Diagnosis not present

## 2021-06-21 DIAGNOSIS — E1342 Other specified diabetes mellitus with diabetic polyneuropathy: Secondary | ICD-10-CM | POA: Diagnosis not present

## 2021-06-21 DIAGNOSIS — I10 Essential (primary) hypertension: Secondary | ICD-10-CM | POA: Diagnosis not present

## 2021-06-21 DIAGNOSIS — J449 Chronic obstructive pulmonary disease, unspecified: Secondary | ICD-10-CM | POA: Diagnosis not present

## 2021-06-22 ENCOUNTER — Other Ambulatory Visit: Payer: Self-pay

## 2021-06-22 ENCOUNTER — Ambulatory Visit (INDEPENDENT_AMBULATORY_CARE_PROVIDER_SITE_OTHER): Payer: Medicare Other | Admitting: Podiatry

## 2021-06-22 DIAGNOSIS — E11621 Type 2 diabetes mellitus with foot ulcer: Secondary | ICD-10-CM | POA: Diagnosis not present

## 2021-06-22 DIAGNOSIS — L97422 Non-pressure chronic ulcer of left heel and midfoot with fat layer exposed: Secondary | ICD-10-CM | POA: Diagnosis not present

## 2021-06-22 NOTE — Progress Notes (Signed)
HPI: FU CAD and atrial fibrillation. Patient states had PCI of LAD in 1988. Patient previously admitted with a pulmonary embolus. During hospitalization he developed atrial fibrillation with a rapid ventricular response. Nuclear study 3/17 showed EF 65, attenuation artifact and no ischemia. Diagnosed with recurrent atrial fibrillation April 2021.  Echocardiogram August 2021 showed normal LV function, mild LVH, mild right ventricular enlargement, moderate right atrial enlargement and dilated ascending aorta measuring 43 mm. Monitor september 2021 showed atrial fibrillation rate controlled. Since I last saw him, chronic dyspnea on exertion unchanged.  No orthopnea, PND, pedal edema, claudication, chest pain or syncope.  Current Outpatient Medications  Medication Sig Dispense Refill   acetaminophen (TYLENOL) 500 MG tablet Take 1,000 mg by mouth daily.      albuterol (VENTOLIN HFA) 108 (90 Base) MCG/ACT inhaler Inhale 2 puffs into the lungs every 2 (two) hours as needed for wheezing or shortness of breath.      atorvastatin (LIPITOR) 40 MG tablet TAKE 1 TABLET BY MOUTH AT  BEDTIME 90 tablet 0   calcium carbonate (OS-CAL) 600 MG TABS tablet Take by mouth.     Cholecalciferol (VITAMIN D3) 20 MCG (800 UNIT) TABS 1 tablet     ELIQUIS 5 MG TABS tablet TAKE 1 TABLET BY MOUTH  TWICE DAILY 180 tablet 3   fexofenadine (ALLEGRA) 180 MG tablet Take 180 mg by mouth daily.     fluticasone (FLONASE) 50 MCG/ACT nasal spray Place 1 spray into the nose 2 (two) times daily.     furosemide (LASIX) 20 MG tablet TAKE 1 TABLET BY MOUTH  DAILY 90 tablet 0   gabapentin (NEURONTIN) 300 MG capsule 4 at bedtime     glimepiride (AMARYL) 2 MG tablet Take 2 mg by mouth 2 (two) times daily.      guaiFENesin (MUCINEX) 600 MG 12 hr tablet Take 1,200 mg by mouth daily with breakfast.      losartan (COZAAR) 50 MG tablet Take 1 tablet (50 mg total) by mouth daily. (Patient taking differently: Take 50 mg by mouth daily with  breakfast.) 90 tablet 0   metFORMIN (GLUCOPHAGE) 1000 MG tablet Take 1,000 mg by mouth 2 (two) times daily with a meal.     metoCLOPramide (REGLAN) 5 MG tablet Take 5 mg by mouth 3 (three) times daily.     Multiple Vitamins-Minerals (CENTRUM SILVER ADULT 50+) TABS Take 1 tablet by mouth daily with breakfast.     omeprazole (PRILOSEC OTC) 20 MG tablet 1 tablet     OXYGEN 4L of oxygen at night and 3L of oxygen when walking long distances     SHINGRIX injection      vitamin C (ASCORBIC ACID) 250 MG tablet Take 250 mg by mouth daily.     No current facility-administered medications for this visit.     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   FIXATION KYPHOPLASTY     LUNG CANCER SURGERY  01/2006   Small excision of left lung tissue; mesh with radioactive seeds (per pt report)   ORIF TIBIA FRACTURE Left 08/22/2016   Procedure: OPEN REDUCTION INTERNAL FIXATION (ORIF) TIBIA FRACTURE;  Surgeon: Nicholes Stairs, MD;  Location: [facility];  Service: Orthopedics;  Laterality: Left;   ROTATOR CUFF TEAR  2016   Tibia/Fibula Repair Left 07/2016   TONSILLECTOMY      Social History   Socioeconomic History   Marital status: Married    Spouse name: Santiago Glad   Number of children: 2   Years of education: college   Highest education level: Not on file  Occupational History    Employer: RETIRED     Comment: Retired from [employer]  Tobacco Use   Smoking status: Every Day    Packs/day: 0.50    Years: 59.00    Pack years: 29.50    Types: Cigarettes   Smokeless tobacco: Never  Substance and Sexual Activity   Alcohol use: Not Currently    Alcohol/week: 1.0 standard drink    Types: 1 Glasses of wine per week   Drug use: Never   Sexual activity: Never  Other Topics Concern   Not on file  Social History Narrative   Patient lives at home with his wife Santiago Glad).   Retired.   Education- College    Right handed.   Caffeine- three cups of coffee daily.   Social Determinants of Health   Financial Resource Strain: Not on file  Food Insecurity: Not on file  Transportation Needs: Not on file  Physical Activity: Not on file  Stress: Not on file  Social Connections: Not on file  Intimate Partner Violence: Not on file    Family History  Problem Relation Age of Onset   Heart disease Mother    Tremor Mother    Dementia Mother    Heart Problems Father     ROS: no fevers or chills, productive cough, hemoptysis, dysphasia, odynophagia, melena, hematochezia, dysuria, hematuria, rash, seizure activity, orthopnea, PND, pedal edema, claudication. Remaining systems are negative.  Physical Exam: Well-developed well-nourished in no acute distress.  Skin is warm and dry.  HEENT is normal.  Neck is supple.  Chest with diminished BS throughout Cardiovascular exam is irregular Abdominal exam nontender or distended. No masses palpated. Extremities show no edema. neuro grossly intact  ECG-atrial fibrillation at a rate of 78, right bundle branch block, left anterior fascicular block.  Personally reviewed  A/P  1 permanent atrial fibrillation-heart rate is controlled with no medications.  We will continue apixaban.  2 coronary artery disease-continue statin.  He is not on aspirin given need for apixaban.  3 hypertension-patient's blood pressure is controlled today.  Continue present  medical regimen.  4 hyperlipidemia-continue statin.  5 Tobacco abuse-patient counseled on discontinuing.  6 dilated aortic root-schedule echo to reassess.  Kirk Ruths, MD

## 2021-06-26 NOTE — Progress Notes (Signed)
  Subjective:  Patient ID: Paul [name], adult    DOB: 10/23/1941,  MRN: [id number]  Chief Complaint  Patient presents with   Wound Check    Left foot graft application    80 y.o. adult returns with the above complaint. History confirmed with patient.    Objective:  Physical Exam: warm, good capillary refill, no trophic changes or ulcerative lesions and normal DP and PT pulses. 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  He has a full-thickness ulceration measuring 0.5 x 0.3 x 0.2 cm postdebridement on the distal hallux.  .  Lateral fifth metatarsal base ulceration has now fully healed      Radiographs left foot: Ulcer noted on plain film radiographs there is no evidence of bony erosion or osteomyelitis, no subcutaneous emphysema noted Assessment:   No diagnosis found.         Plan:  Patient was evaluated and treated and all questions answered.  Patient educated on diabetes. Discussed proper diabetic foot care and discussed risks and complications of disease. Educated patient in depth on reasons to return to the office immediately should he/she discover anything concerning or new on the feet. All questions answered. Discussed proper shoes as well.   Ulcer left foot midfoot and hallux -Medial midfoot wound has now healed -Debridement of all wounds was performed as below.  The wound beds were prepared with all devitalized tissue removed to healthy granular bleeding base.  1 sheet of Affinity 2.5 x 2.5 cm were applied, to the hallux wound to provide viable stem cells for epithelialization.  Donor ID number [id number] expiration date 06/23/2021.  Dressed with  Mepitel, gauze dressing and Steri-Strips  No wastage of units    Procedure: Excisional Debridement of Wound Rationale: Removal of non-viable soft tissue from the wound to promote healing.  Anesthesia: none Post-Debridement Wound Measurements: 0.5 x 0.3 x 0.2cm Type of  Debridement: Sharp selective Tissue Removed: Non-viable soft tissue Depth of Debridement: subcutaneous tissue. Technique: Sharp selective debridement to bleeding, viable wound base.  Dressing: Dry, sterile, compression dressing. Disposition: Patient tolerated procedure well.      Return in about 1 week (around 06/29/2021) for wound care, Graft application to wound.

## 2021-06-29 ENCOUNTER — Other Ambulatory Visit: Payer: Self-pay

## 2021-06-29 ENCOUNTER — Ambulatory Visit (INDEPENDENT_AMBULATORY_CARE_PROVIDER_SITE_OTHER): Payer: Medicare Other | Admitting: Podiatry

## 2021-06-29 DIAGNOSIS — L97422 Non-pressure chronic ulcer of left heel and midfoot with fat layer exposed: Secondary | ICD-10-CM

## 2021-06-29 DIAGNOSIS — I739 Peripheral vascular disease, unspecified: Secondary | ICD-10-CM | POA: Diagnosis not present

## 2021-06-29 DIAGNOSIS — M2142 Flat foot [pes planus] (acquired), left foot: Secondary | ICD-10-CM | POA: Diagnosis not present

## 2021-06-29 DIAGNOSIS — E11621 Type 2 diabetes mellitus with foot ulcer: Secondary | ICD-10-CM | POA: Diagnosis not present

## 2021-06-30 ENCOUNTER — Other Ambulatory Visit: Payer: Self-pay | Admitting: Podiatry

## 2021-06-30 DIAGNOSIS — E11621 Type 2 diabetes mellitus with foot ulcer: Secondary | ICD-10-CM

## 2021-07-03 ENCOUNTER — Ambulatory Visit (INDEPENDENT_AMBULATORY_CARE_PROVIDER_SITE_OTHER): Payer: Medicare Other | Admitting: Cardiology

## 2021-07-03 ENCOUNTER — Other Ambulatory Visit: Payer: Self-pay

## 2021-07-03 ENCOUNTER — Telehealth: Payer: Self-pay | Admitting: Podiatry

## 2021-07-03 ENCOUNTER — Encounter: Payer: Self-pay | Admitting: Cardiology

## 2021-07-03 ENCOUNTER — Ambulatory Visit (HOSPITAL_COMMUNITY)
Admission: RE | Admit: 2021-07-03 | Discharge: 2021-07-03 | Disposition: A | Discharge status: Home / Self Care | Payer: Medicare Other | Source: Ambulatory Visit | Attending: Cardiology | Admitting: Cardiology

## 2021-07-03 VITALS — BP 136/72 | HR 78 | Ht 67.5 in | Wt 196.8 lb

## 2021-07-03 DIAGNOSIS — L97422 Non-pressure chronic ulcer of left heel and midfoot with fat layer exposed: Secondary | ICD-10-CM | POA: Insufficient documentation

## 2021-07-03 DIAGNOSIS — I2510 Atherosclerotic heart disease of native coronary artery without angina pectoris: Secondary | ICD-10-CM | POA: Diagnosis not present

## 2021-07-03 DIAGNOSIS — I480 Paroxysmal atrial fibrillation: Secondary | ICD-10-CM

## 2021-07-03 DIAGNOSIS — I77810 Thoracic aortic ectasia: Secondary | ICD-10-CM | POA: Diagnosis not present

## 2021-07-03 DIAGNOSIS — I10 Essential (primary) hypertension: Secondary | ICD-10-CM | POA: Diagnosis not present

## 2021-07-03 DIAGNOSIS — E11621 Type 2 diabetes mellitus with foot ulcer: Secondary | ICD-10-CM

## 2021-07-03 DIAGNOSIS — E7800 Pure hypercholesterolemia, unspecified: Secondary | ICD-10-CM

## 2021-07-03 NOTE — Telephone Encounter (Signed)
Mailed diabetic shoe paperwork to pcp.

## 2021-07-03 NOTE — Patient Instructions (Signed)
    Testing/Procedures:  Your physician has requested that you have an echocardiogram. Echocardiography is a painless test that uses sound waves to create images of your heart. It provides your doctor with information about the size and shape of your heart and how well your heart's chambers and valves are working. This procedure takes approximately one hour. There are no restrictions for this procedure. [place]   Follow-Up: At [facility], you and your health needs are our priority.  As part of our continuing mission to provide you with exceptional heart care, we have created designated Provider Care Teams.  These Care Teams include your primary Cardiologist (physician) and Advanced Practice Providers (APPs -  Physician Assistants and Nurse Practitioners) who all work together to provide you with the care you need, when you need it.  We recommend signing up for the patient portal called "MyChart".  Sign up information is provided on this After Visit Summary.  MyChart is used to connect with patients for Virtual Visits (Telemedicine).  Patients are able to view lab/test results, encounter notes, upcoming appointments, etc.  Non-urgent messages can be sent to your provider as well.   To learn more about what you can do with MyChart, go to [email or web address].    Your next appointment:   6 month(s)  The format for your next appointment:   In Person  Provider:   Kirk Ruths, MD

## 2021-07-04 ENCOUNTER — Ambulatory Visit: Payer: Medicare Other | Admitting: Internal Medicine

## 2021-07-04 ENCOUNTER — Ambulatory Visit (INDEPENDENT_AMBULATORY_CARE_PROVIDER_SITE_OTHER): Payer: Medicare Other | Admitting: Cardiovascular Disease

## 2021-07-04 VITALS — BP 114/60 | HR 80 | Ht 67.5 in | Wt 196.4 lb

## 2021-07-04 DIAGNOSIS — Z7901 Long term (current) use of anticoagulants: Secondary | ICD-10-CM

## 2021-07-04 DIAGNOSIS — I482 Chronic atrial fibrillation, unspecified: Secondary | ICD-10-CM

## 2021-07-04 DIAGNOSIS — I2510 Atherosclerotic heart disease of native coronary artery without angina pectoris: Secondary | ICD-10-CM

## 2021-07-04 DIAGNOSIS — I739 Peripheral vascular disease, unspecified: Secondary | ICD-10-CM | POA: Diagnosis not present

## 2021-07-04 DIAGNOSIS — Z720 Tobacco use: Secondary | ICD-10-CM

## 2021-07-04 DIAGNOSIS — E7800 Pure hypercholesterolemia, unspecified: Secondary | ICD-10-CM

## 2021-07-04 DIAGNOSIS — I10 Essential (primary) hypertension: Secondary | ICD-10-CM | POA: Diagnosis not present

## 2021-07-04 NOTE — Progress Notes (Signed)
  Cardiology Office Note   Date:  07/04/2021   ID:  Paul [name], DOB 06/23/1941, MRN [id number]  PCP:  Wolters, Sharon, MD  Cardiologist:  Dr. Crenshaw  No chief complaint on file.     History of Present Illness: Paul [name] is a 80 y.o. adult who was referred by Dr. Crenshaw for evaluation management of peripheral arterial disease. He has known history of coronary artery disease status post PCI of the LAD in 1988, permanent atrial fibrillation, COPD, diabetes mellitus, essential hypertension, hyperlipidemia, previous lung cancer and tobacco use.  He has been smoking since he was a teenager and has been cutting down slowly but has not been able to quit.  The patient has been following with Dr. McDonald in podiatry for diabetic ulcer on the left big toe which has not healed over the last few months.    The patient did have previous left fifth metatarsal base ulceration that has healed.  He does complain of left leg weakness and tiredness with walking.  No symptoms on the right side. He underwent lower extremity arterial studies yesterday which showed an ABI of 1.17 on the right and noncompressible on the left with TBI of 0.25.  Duplex showed no significant disease affecting the right lower extremity.  On the left, the common iliac artery was occluded into the proximal external iliac artery with no significant infrainguinal disease.   Past Medical History:  Diagnosis Date   Asthma    Atrial fibrillation (HCC)    CAD (coronary artery disease)    Prior PTCA   COPD (chronic obstructive pulmonary disease) (HCC)    Diabetes mellitus    GERD (gastroesophageal reflux disease)    Hard of hearing    Hyperlipidemia    Hypertension    lung ca dx'd 01/2006   Nephrolithiasis    Neuropathy    Osteopenia    Pulmonary embolus (HCC)    Tibia/fibula fracture 07/2016   Left   Tremor, essential     Past Surgical History:  Procedure Laterality Date   Angioplasty  Approx 1993    CARDIAC CATHETERIZATION  Approximately 1993   CHOLECYSTECTOMY     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      albuterol (VENTOLIN HFA) 108 (90 Base) MCG/ACT inhaler Inhale 2 puffs into the lungs every 2 (two) hours as needed for wheezing or shortness of breath.      aspirin EC 81 MG tablet Take 81 mg by mouth daily. Swallow whole.     atorvastatin (LIPITOR) 40 MG tablet TAKE 1 TABLET BY MOUTH AT  BEDTIME 90 tablet 0   calcium carbonate (OS-CAL) 600 MG TABS tablet Take by mouth.     Cholecalciferol (VITAMIN D3) 20 MCG (800 UNIT) TABS 1 tablet     ELIQUIS 5 MG TABS tablet TAKE 1 TABLET BY MOUTH  TWICE DAILY 180 tablet 3     fexofenadine (ALLEGRA) 180 MG tablet Take 180 mg by mouth daily.     fluticasone (FLONASE) 50 MCG/ACT nasal spray Place 1 spray into the nose 2 (two) times daily.     furosemide (LASIX) 20 MG tablet TAKE 1 TABLET BY MOUTH  DAILY 90 tablet 0   gabapentin (NEURONTIN) 300 MG capsule 4 at bedtime     glimepiride (AMARYL) 2 MG tablet Take 2 mg by mouth 2 (two) times daily.      guaiFENesin (MUCINEX) 600 MG 12 hr tablet Take 1,200 mg by mouth daily with breakfast.      losartan (COZAAR) 50 MG tablet Take 1 tablet (50 mg total) by mouth daily. (Patient taking differently: Take 50 mg by mouth daily with breakfast.) 90 tablet 0   metFORMIN (GLUCOPHAGE) 1000 MG tablet Take 1,000 mg by mouth 2 (two) times daily with a meal.      metoCLOPramide (REGLAN) 5 MG tablet Take 5 mg by mouth 3 (three) times daily.     Multiple Vitamins-Minerals (CENTRUM SILVER ADULT 50+) TABS Take 1 tablet by mouth daily with breakfast.     omeprazole (PRILOSEC OTC) 20 MG tablet 1 tablet     OXYGEN 4L of oxygen at night and 3L of oxygen when walking long distances     SHINGRIX injection      vitamin C (ASCORBIC ACID) 250 MG tablet Take 250 mg by mouth daily.     No current facility-administered medications for this visit.    Allergies:   Codeine and Phenobarbital    Social History:  The patient  reports that he has been smoking cigarettes. He has a 29.50 pack-year smoking history. He has never used smokeless tobacco. He reports that he does not currently use alcohol after a past usage of about 1.0 standard drink per week. He reports that he does not use drugs.   Family History:  The patient's family history includes Dementia in his mother; Heart Problems in his father; Heart disease in his mother; Tremor in his mother.    ROS:  Please see the history of present illness.   Otherwise, review of systems are positive for none.   All other systems are reviewed and negative.    PHYSICAL EXAM: VS:  BP 114/60 (BP Location: Left Arm, Patient Position: Sitting, Cuff Size: Normal)   Pulse 80   Ht 5' 7.5" (1.715 m)   Wt 196 lb 6.4 oz (89.1 kg)   SpO2 93%   BMI 30.31 kg/m  , BMI Body mass index is 30.31 kg/m. GEN: Well nourished, well developed, in no acute distress  HEENT: normal  Neck: no JVD, carotid bruits, or masses Cardiac: Irregularly irregular; no murmurs, rubs, or gallops,no edema  Respiratory:  clear to auscultation bilaterally, normal work of breathing GI: soft, nontender, nondistended, + BS MS: no deformity or atrophy  Skin: warm and dry, no rash Neuro:  Strength and sensation are intact Psych: euthymic mood, full affect Small ulceration on the dorsal part of the left big toe.  Distal pulses are not palpable on the  left.   EKG:  EKG is not ordered today.    Recent Labs: 07/08/2020: ALT 25; TSH 1.230 01/06/2021: BUN 18; Hemoglobin 12.4; Platelets 285; Potassium 4.4; Sodium 143 02/21/2021: Creatinine, Ser 0.90    Lipid Panel    Component Value Date/Time   CHOL 109 07/08/2020 0826   TRIG 46 07/08/2020 0826   HDL 64 07/08/2020 0826   CHOLHDL 1.7 07/08/2020 0826   CHOLHDL 1.8 03/04/2017   0829   VLDL 13 03/04/2017 0829   LDLCALC 33 07/08/2020 0826      Wt Readings from Last 3 Encounters:  07/04/21 196 lb 6.4 oz (89.1 kg)  07/03/21 196 lb 12.8 oz (89.3 kg)  06/06/21 203 lb (92.1 kg)       No flowsheet data found.    ASSESSMENT AND PLAN:  1.  Peripheral arterial disease: The patient has nonhealing ulceration on the left big toe.  He is at high risk for further tissue loss especially with his diabetic status and continued tobacco use.  His left iliac artery is occluded and he likely does not have optimal blood flow to heal the ulceration.  Due to this, I recommend proceeding with abdominal aortogram with lower extremity runoff and possible endovascular intervention.  I discussed the procedure in details as well as risk and benefits.  Planned access is via right common femoral artery.  Hold Eliquis 2 days before the procedure and start aspirin 81 mg daily.  I discussed the high risk of bleeding given that he is on anticoagulation long-term.  2.  Coronary artery disease involving native coronary arteries without angina: He seems to be stable from a cardiac standpoint.  3.  Tobacco use: I discussed with him the importance of smoking cessation.  4.  Hyperlipidemia: I reviewed his most recent lipid profile done in August 2021.  It showed an LDL of 33 which is at target.  Continue atorvastatin 40 mg daily.  5.  Essential hypertension: Blood pressure is controlled.  6.  Permanent atrial fibrillation: Ventricular rate is controlled and he is on long-term anticoagulation with Eliquis which will be  held 2 days before angiogram.    Disposition:   Proceed with an angiogram next week and follow-up with me in 1 month.  Signed,  Areliz Rothman, MD  07/04/2021 1:38 PM    [facility]  

## 2021-07-04 NOTE — Progress Notes (Deleted)
Subjective:    Patient ID: Paul [name], male    DOB: Aug 10, 1941  MRN: [id number]    Brief patient profile:  26  yowm  active smoker with dx of copd with worse symptoms since 2013 referred to pulmonary clinic 12/24/2012 by Dr Darcus Austin for COPD evaluation with GOLD II criteria May 2014    History of Present Illness  12/24/2012 1st pulmonary eval  On advair/ spiriva longterm with freq use of proventil prior to dx  PE presenting as pleurtic L CP May 2013 with "moderate clot burden" to  [facility] assoc with PAF and started on Xarelto and baseline doe x climbing steps but could walk flat all day long before and after PE but then indolent onset doe progressively worse x 9 months assoc with cough congestion > green mucus better p on levaquin x one week but convinced his coughing and breathing problems are all due to xarelto because of what he read on the package. rec Only use your albuterol (proaire) as a rescue medication (plan B)  to be used if you can't catch your breath   The key is to stop smoking completely before smoking completely stops you           08/05/2017  f/u ov/Bentley Fissel re:   COPD II/ still smoking < 10 per day maint symb/spiriva  Chief Complaint  Patient presents with   Follow-up    Breathing is overall doing well. He states he is coughing more since he has been home from the nursing home. His cough is prod with brown sputum. He is using his proair 4 x daily on average.   symbicort 160/spiriva 2.5  Worse breathing when not at home = stuffy nose cough not an issue while living at rehab  Convinced it's the formaldehyde in the house and only smokes outside  preferes allegra over zyrtec  MMRC3 = can't walk 100 yards even at a slow pace at a flat grade s stopping due to sob  / 02 4lpm hs  Really no change doe or cough x last year unless leaves house, then improved  02 4lpm hs/ no am ha/ feels sleeps well  rec The key is to stop smoking completely before smoking completely stops you!   Breathe clear air       05/23/2020  f/u ov/Cissy Galbreath re: copd GOLD II/ still smoking / seocnd pfizer 01/12/20 Misunderstood dosing on both symb and spiriva (using half the rec amt)  Chief Complaint  Patient presents with   Follow-up  Dyspnea:  Room to room or [facility] to building stops half way using walking  Cough: more productive since last ov Sleeping: ok bed is flat/ wedge to 30 degrees  SABA use: never prechallenges/ only uses p ex  02: 4lpm concentrator sleeping / nothing at rest / back on 4lpm with activity  Rec Make sure you check your oxygen saturations at highest level of activity to be sure it stays over 90% and adjust upward to maintain this level if needed but remember to turn it back to previous settings when you stop (to conserve your supply).  Plan A = Automatic = Always=  Symbicort 160 Take 2 puffs first thing in am and then another 2 puffs about 12 hours later  And spiriva 2puffs thing in am  Plan B = Backup (to supplement plan A, not to replace it) Only use your albuterol inhaler as a rescue medication  Please schedule a follow up visit in 12 months but call  sooner if needed    06/06/2021  f/u ov/Carsen Machi re: COPD GOLD II /  still smoking  Chief Complaint  Patient presents with   Shortness of Breath    Pt reports he will be unable to "walk" for oxygen sat measurements today as he feels unsteady on his feet and says "I can't go very far even with my walker"'. Arrives with left foot boot/shoe that does not extend past ankle.    Dyspnea:  room to room and neuropathy / leg ulcers  Cough: more green x 6 months /rattly esp in am  Sleeping: wedge at 30 degrees  SABA use: 6 x daily  02: 3lpm at rest/ 4lpm hs and 4lpm walking not checking  Covid status:   x 4  Rec Plan A = Automatic = Always=    Symbicort 160 Take 2 puffs first thing in am and then another 2 puffs about 12 hours later. Try  off spiriva x 3 - 5 days to see what changes you notice especially your bowel or bladder Work on  inhaler technique:  Plan B = Backup (to supplement plan A, not to replace it) Only use your albuterol inhaler as a rescue medication Plan C = Crisis (instead of Plan B but only if Plan B stops working) - only use your albuterol nebulizer if you first try Plan B  Augmentin 875 mg take one pill twice daily  X 10 days  Please schedule a follow up office visit in 4 weeks, sooner if needed     07/04/2021  f/u ov/Libia Fazzini re: copd gold 2/ f/u cxr    maint on ***  No chief complaint on file.   Dyspnea:  *** Cough: *** Sleeping: *** SABA use: *** 02: *** Covid status:   ***   No obvious day to day or daytime variability or assoc excess/ purulent sputum or mucus plugs or hemoptysis or cp or chest tightness, subjective wheeze or overt sinus or hb symptoms.   *** without nocturnal  or early am exacerbation  of respiratory  c/o's or need for noct saba. Also denies any obvious fluctuation of symptoms with weather or environmental changes or other aggravating or alleviating factors except as outlined above   No unusual exposure hx or h/o childhood pna/ asthma or knowledge of premature birth.  Current Allergies, Complete Past Medical History, Past Surgical History, Family History, and Social History were reviewed in [employer] record.  ROS  The following are not active complaints unless bolded Hoarseness, sore throat, dysphagia, dental problems, itching, sneezing,  nasal congestion or discharge of excess mucus or purulent secretions, ear ache,   fever, chills, sweats, unintended wt loss or wt gain, classically pleuritic or exertional cp,  orthopnea pnd or arm/hand swelling  or leg swelling, presyncope, palpitations, abdominal pain, anorexia, nausea, vomiting, diarrhea  or change in bowel habits or change in bladder habits, change in stools or change in urine, dysuria, hematuria,  rash, arthralgias, visual complaints, headache, numbness, weakness or ataxia or problems with walking or  coordination,  change in mood or  memory.        No outpatient medications have been marked as taking for the 07/04/21 encounter (Appointment) with Tanda Rockers, MD.                            Objective:   Physical Exam   07/04/2021    ***  06/06/2021    203 05/23/2020  220  02/05/2013  Wt 266 > 03/26/2013  269 > 06/21/2014 261 > 08/04/2014  256 >257 03/07/2015 > 09/05/2015  254 > 01/26/2016  250 > 08/01/2016  248 > 01/29/2017   250   > 05/01/2017   248  > 08/05/2017   249 > 12/05/2017   235  > 07/01/2018   222    Vital signs reviewed  07/04/2021  - Note at rest 02 sats  ***% on ***   General appearance:    ***    Mod bar***                   Assessment & Plan:

## 2021-07-04 NOTE — H&P (View-Only) (Signed)
Cardiology Office Note   Date:  07/04/2021   ID:  Paul [name] [birth year], MRN [id number]  PCP:  Paul Jordan, MD  Cardiologist:  Dr. Stanford Breed  No chief complaint on file.     History of Present Illness: Paul [name] is a 80 y.o. adult who was referred by Dr. Stanford Breed for evaluation management of peripheral arterial disease. He has known history of coronary artery disease status post PCI of the LAD in 1988, permanent atrial fibrillation, COPD, diabetes mellitus, essential hypertension, hyperlipidemia, previous lung cancer and tobacco use.  He has been smoking since he was a teenager and has been cutting down slowly but has not been able to quit.  The patient has been following with Dr. Sherryle [name] in podiatry for diabetic ulcer on the left big toe which has not healed over the last few months.    The patient did have previous left fifth metatarsal base ulceration that has healed.  He does complain of left leg weakness and tiredness with walking.  No symptoms on the right side. He underwent lower extremity arterial studies yesterday which showed an ABI of 1.17 on the right and noncompressible on the left with TBI of 0.25.  Duplex showed no significant disease affecting the right lower extremity.  On the left, the common iliac artery was occluded into the proximal external iliac artery with no significant infrainguinal disease.   Past Medical History:  Diagnosis Date   Asthma    Atrial fibrillation ([place])    CAD (coronary artery disease)    Prior PTCA   COPD (chronic obstructive pulmonary disease) (HCC)    Diabetes mellitus    GERD (gastroesophageal reflux disease)    Hard of hearing    Hyperlipidemia    Hypertension    lung ca dx'd 01/2006   Nephrolithiasis    Neuropathy    Osteopenia    Pulmonary embolus (HCC)    Tibia/fibula fracture 07/2016   Left   Tremor, essential     Past Surgical History:  Procedure Laterality Date   Angioplasty  Approx 1993    CARDIAC CATHETERIZATION  Approximately 1993   CHOLECYSTECTOMY     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      albuterol (VENTOLIN HFA) 108 (90 Base) MCG/ACT inhaler Inhale 2 puffs into the lungs every 2 (two) hours as needed for wheezing or shortness of breath.      aspirin EC 81 MG tablet Take 81 mg by mouth daily. Swallow whole.     atorvastatin (LIPITOR) 40 MG tablet TAKE 1 TABLET BY MOUTH AT  BEDTIME 90 tablet 0   calcium carbonate (OS-CAL) 600 MG TABS tablet Take by mouth.     Cholecalciferol (VITAMIN D3) 20 MCG (800 UNIT) TABS 1 tablet     ELIQUIS 5 MG TABS tablet TAKE 1 TABLET BY MOUTH  TWICE DAILY 180 tablet 3  fexofenadine (ALLEGRA) 180 MG tablet Take 180 mg by mouth daily.     fluticasone (FLONASE) 50 MCG/ACT nasal spray Place 1 spray into the nose 2 (two) times daily.     furosemide (LASIX) 20 MG tablet TAKE 1 TABLET BY MOUTH  DAILY 90 tablet 0   gabapentin (NEURONTIN) 300 MG capsule 4 at bedtime     glimepiride (AMARYL) 2 MG tablet Take 2 mg by mouth 2 (two) times daily.      guaiFENesin (MUCINEX) 600 MG 12 hr tablet Take 1,200 mg by mouth daily with breakfast.      losartan (COZAAR) 50 MG tablet Take 1 tablet (50 mg total) by mouth daily. (Patient taking differently: Take 50 mg by mouth daily with breakfast.) 90 tablet 0   metFORMIN (GLUCOPHAGE) 1000 MG tablet Take 1,000 mg by mouth 2 (two) times daily with a meal.      metoCLOPramide (REGLAN) 5 MG tablet Take 5 mg by mouth 3 (three) times daily.     Multiple Vitamins-Minerals (CENTRUM SILVER ADULT 50+) TABS Take 1 tablet by mouth daily with breakfast.     omeprazole (PRILOSEC OTC) 20 MG tablet 1 tablet     OXYGEN 4L of oxygen at night and 3L of oxygen when walking long distances     SHINGRIX injection      vitamin C (ASCORBIC ACID) 250 MG tablet Take 250 mg by mouth daily.     No current facility-administered medications for this visit.    Allergies:   Codeine and Phenobarbital    Social History:  The patient  reports that he has been smoking cigarettes. He has a 29.50 pack-year smoking history. He has never used smokeless tobacco. He reports that he does not currently use alcohol after a past usage of about 1.0 standard drink per week. He reports that he does not use drugs.   Family History:  The patient's family history includes Dementia in his mother; Heart Problems in his father; Heart disease in his mother; Tremor in his mother.    ROS:  Please see the history of present illness.   Otherwise, review of systems are positive for none.   All other systems are reviewed and negative.    PHYSICAL EXAM: VS:  BP 114/60 (BP Location: Left Arm, Patient Position: Sitting, Cuff Size: Normal)   Pulse 80   Ht 5' 7.5" (1.715 m)   Wt 196 lb 6.4 oz (89.1 kg)   SpO2 93%   BMI 30.31 kg/m  , BMI Body mass index is 30.31 kg/m. GEN: Well nourished, well developed, in no acute distress  HEENT: normal  Neck: no JVD, carotid bruits, or masses Cardiac: Irregularly irregular; no murmurs, rubs, or gallops,no edema  Respiratory:  clear to auscultation bilaterally, normal work of breathing GI: soft, nontender, nondistended, + BS MS: no deformity or atrophy  Skin: warm and dry, no rash Neuro:  Strength and sensation are intact Psych: euthymic mood, full affect Small ulceration on the dorsal part of the left big toe.  Distal pulses are not palpable on the  left.   EKG:  EKG is not ordered today.    Recent Labs: 07/08/2020: ALT 25; TSH 1.230 01/06/2021: BUN 18; Hemoglobin 12.4; Platelets 285; Potassium 4.4; Sodium 143 02/21/2021: Creatinine, Ser 0.90    Lipid Panel    Component Value Date/Time   CHOL 109 07/08/2020 0826   TRIG 46 07/08/2020 0826   HDL 64 07/08/2020 0826   CHOLHDL 1.7 07/08/2020 0826   CHOLHDL 1.8 03/04/2017  0829   VLDL 13 03/04/2017 0829   LDLCALC 33 07/08/2020 0826      Wt Readings from Last 3 Encounters:  07/04/21 196 lb 6.4 oz (89.1 kg)  07/03/21 196 lb 12.8 oz (89.3 kg)  06/06/21 203 lb (92.1 kg)       No flowsheet data found.    ASSESSMENT AND PLAN:  1.  Peripheral arterial disease: The patient has nonhealing ulceration on the left big toe.  He is at high risk for further tissue loss especially with his diabetic status and continued tobacco use.  His left iliac artery is occluded and he likely does not have optimal blood flow to heal the ulceration.  Due to this, I recommend proceeding with abdominal aortogram with lower extremity runoff and possible endovascular intervention.  I discussed the procedure in details as well as risk and benefits.  Planned access is via right common femoral artery.  Hold Eliquis 2 days before the procedure and start aspirin 81 mg daily.  I discussed the high risk of bleeding given that he is on anticoagulation long-term.  2.  Coronary artery disease involving native coronary arteries without angina: He seems to be stable from a cardiac standpoint.  3.  Tobacco use: I discussed with him the importance of smoking cessation.  4.  Hyperlipidemia: I reviewed his most recent lipid profile done in August 2021.  It showed an LDL of 33 which is at target.  Continue atorvastatin 40 mg daily.  5.  Essential hypertension: Blood pressure is controlled.  6.  Permanent atrial fibrillation: Ventricular rate is controlled and he is on long-term anticoagulation with Eliquis which will be  held 2 days before angiogram.    Disposition:   Proceed with an angiogram next week and follow-up with me in 1 month.  Signed,  Kathlyn Sacramento, MD  07/04/2021 1:38 PM    [place]

## 2021-07-04 NOTE — Patient Instructions (Signed)
Medication Instructions:  START aspirin 3m daily CONTINUE all other current medications  *If you need a refill on your cardiac medications before your next appointment, please call your pharmacy*   Lab Work: CBarbertoday -- pre-procedure labs  If you have labs (blood work) drawn today and your tests are completely normal, you will receive your results only by: [place](if you have MyChart) OR A paper copy in the mail If you have any lab test that is abnormal or we need to change your treatment, we will call you to review the results.   Testing[facility]Procedures[facility]    Follow-Up: [facility] our [facility].  As part of our continuing mission to provide you with exceptional heart care, we have created designated Provider Care Teams.  These Care Teams include your primary Cardiologist (physician) and Advanced Practice Providers (APPs -  Physician Assistants and Nurse Practitioners) who all work together to provide you with the care you need, when you need it.  We recommend signing up for the patient portal called "MyChart".  Sign up information is provided on this After Visit Summary.  MyChart is used to connect with patients for Virtual Visits (Telemedicine).  Patients are able to view lab/test results, encounter notes, upcoming appointments, etc.  Non-urgent messages can be sent to your provider as well.   To learn more about what you can do with MyChart, go to [email or web address]    Your next appointment:   4 week(s)  The format for your next appointment:   In Person  Provider:   MKathlyn Sacramento MD   Other Instructions  [place]Dept: [phone]: 3[place] 07/04/2021  You are scheduled for a Peripheral Angiogram on  Wednesday, August 31 with Dr. MKathlyn Sacramento  1. [facility] [address] [place] [address]at 6:30 AM (This time is two hours before your procedure to ensure your preparation). Free valet parking service is available.   Special note: Every effort is made to have your procedure done on time. Please understand that emergencies sometimes delay scheduled procedures.  2. Diet: Do not eat solid foods after midnight.  The patient may have clear liquids until 5am upon the day of the procedure.  3. Labs: BMET & CBC today 07/04/21  4. Medication instructions in preparation for your procedure: HOLD Eliquis TWO DAYS before your procedure  HOLD furosemide the day of procedure  HOLD diabetic medications (glimepiride and metformin) the morning of procedure  HOLD metformin 48 hours post-procedure also  On the morning of your procedure, take your Aspirin and any morning medicines NOT listed above.  You may use sips of water.  5. Plan for one night stay--bring personal belongings. 6. Bring a current list of your medications and current insurance cards. 7. You MUST have a responsible person to drive you home. 8. Someone MUST be with you the first 24 hours after you arrive home or your discharge will be delayed. 9. Please wear clothes that are easy to get on and off and wear slip-on shoes.  Thank you for allowing [place] care for you!   -- [place] [facility]

## 2021-07-04 NOTE — H&P (View-Only) (Signed)
  Cardiology Office Note   Date:  07/04/2021   ID:  Paul [name], DOB 08/28/1941, MRN [id number]  PCP:  Wolters, Sharon, MD  Cardiologist:  Dr. Crenshaw  No chief complaint on file.     History of Present Illness: Paul [name] is a 80 y.o. adult who was referred by Dr. Crenshaw for evaluation management of peripheral arterial disease. He has known history of coronary artery disease status post PCI of the LAD in 1988, permanent atrial fibrillation, COPD, diabetes mellitus, essential hypertension, hyperlipidemia, previous lung cancer and tobacco use.  He has been smoking since he was a teenager and has been cutting down slowly but has not been able to quit.  The patient has been following with Dr. McDonald in podiatry for diabetic ulcer on the left big toe which has not healed over the last few months.    The patient did have previous left fifth metatarsal base ulceration that has healed.  He does complain of left leg weakness and tiredness with walking.  No symptoms on the right side. He underwent lower extremity arterial studies yesterday which showed an ABI of 1.17 on the right and noncompressible on the left with TBI of 0.25.  Duplex showed no significant disease affecting the right lower extremity.  On the left, the common iliac artery was occluded into the proximal external iliac artery with no significant infrainguinal disease.   Past Medical History:  Diagnosis Date   Asthma    Atrial fibrillation (HCC)    CAD (coronary artery disease)    Prior PTCA   COPD (chronic obstructive pulmonary disease) (HCC)    Diabetes mellitus    GERD (gastroesophageal reflux disease)    Hard of hearing    Hyperlipidemia    Hypertension    lung ca dx'd 01/2006   Nephrolithiasis    Neuropathy    Osteopenia    Pulmonary embolus (HCC)    Tibia/fibula fracture 07/2016   Left   Tremor, essential     Past Surgical History:  Procedure Laterality Date   Angioplasty  Approx 1993    CARDIAC CATHETERIZATION  Approximately 1993   CHOLECYSTECTOMY     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      albuterol (VENTOLIN HFA) 108 (90 Base) MCG/ACT inhaler Inhale 2 puffs into the lungs every 2 (two) hours as needed for wheezing or shortness of breath.      aspirin EC 81 MG tablet Take 81 mg by mouth daily. Swallow whole.     atorvastatin (LIPITOR) 40 MG tablet TAKE 1 TABLET BY MOUTH AT  BEDTIME 90 tablet 0   calcium carbonate (OS-CAL) 600 MG TABS tablet Take by mouth.     Cholecalciferol (VITAMIN D3) 20 MCG (800 UNIT) TABS 1 tablet     ELIQUIS 5 MG TABS tablet TAKE 1 TABLET BY MOUTH  TWICE DAILY 180 tablet 3     fexofenadine (ALLEGRA) 180 MG tablet Take 180 mg by mouth daily.     fluticasone (FLONASE) 50 MCG/ACT nasal spray Place 1 spray into the nose 2 (two) times daily.     furosemide (LASIX) 20 MG tablet TAKE 1 TABLET BY MOUTH  DAILY 90 tablet 0   gabapentin (NEURONTIN) 300 MG capsule 4 at bedtime     glimepiride (AMARYL) 2 MG tablet Take 2 mg by mouth 2 (two) times daily.      guaiFENesin (MUCINEX) 600 MG 12 hr tablet Take 1,200 mg by mouth daily with breakfast.      losartan (COZAAR) 50 MG tablet Take 1 tablet (50 mg total) by mouth daily. (Patient taking differently: Take 50 mg by mouth daily with breakfast.) 90 tablet 0   metFORMIN (GLUCOPHAGE) 1000 MG tablet Take 1,000 mg by mouth 2 (two) times daily with a meal.      metoCLOPramide (REGLAN) 5 MG tablet Take 5 mg by mouth 3 (three) times daily.     Multiple Vitamins-Minerals (CENTRUM SILVER ADULT 50+) TABS Take 1 tablet by mouth daily with breakfast.     omeprazole (PRILOSEC OTC) 20 MG tablet 1 tablet     OXYGEN 4L of oxygen at night and 3L of oxygen when walking long distances     SHINGRIX injection      vitamin C (ASCORBIC ACID) 250 MG tablet Take 250 mg by mouth daily.     No current facility-administered medications for this visit.    Allergies:   Codeine and Phenobarbital    Social History:  The patient  reports that he has been smoking cigarettes. He has a 29.50 pack-year smoking history. He has never used smokeless tobacco. He reports that he does not currently use alcohol after a past usage of about 1.0 standard drink per week. He reports that he does not use drugs.   Family History:  The patient's family history includes Dementia in his mother; Heart Problems in his father; Heart disease in his mother; Tremor in his mother.    ROS:  Please see the history of present illness.   Otherwise, review of systems are positive for none.   All other systems are reviewed and negative.    PHYSICAL EXAM: VS:  BP 114/60 (BP Location: Left Arm, Patient Position: Sitting, Cuff Size: Normal)   Pulse 80   Ht 5' 7.5" (1.715 m)   Wt 196 lb 6.4 oz (89.1 kg)   SpO2 93%   BMI 30.31 kg/m  , BMI Body mass index is 30.31 kg/m. GEN: Well nourished, well developed, in no acute distress  HEENT: normal  Neck: no JVD, carotid bruits, or masses Cardiac: Irregularly irregular; no murmurs, rubs, or gallops,no edema  Respiratory:  clear to auscultation bilaterally, normal work of breathing GI: soft, nontender, nondistended, + BS MS: no deformity or atrophy  Skin: warm and dry, no rash Neuro:  Strength and sensation are intact Psych: euthymic mood, full affect Small ulceration on the dorsal part of the left big toe.  Distal pulses are not palpable on the  left.   EKG:  EKG is not ordered today.    Recent Labs: 07/08/2020: ALT 25; TSH 1.230 01/06/2021: BUN 18; Hemoglobin 12.4; Platelets 285; Potassium 4.4; Sodium 143 02/21/2021: Creatinine, Ser 0.90    Lipid Panel    Component Value Date/Time   CHOL 109 07/08/2020 0826   TRIG 46 07/08/2020 0826   HDL 64 07/08/2020 0826   CHOLHDL 1.7 07/08/2020 0826   CHOLHDL 1.8 03/04/2017   0829   VLDL 13 03/04/2017 0829   LDLCALC 33 07/08/2020 0826      Wt Readings from Last 3 Encounters:  07/04/21 196 lb 6.4 oz (89.1 kg)  07/03/21 196 lb 12.8 oz (89.3 kg)  06/06/21 203 lb (92.1 kg)       No flowsheet data found.    ASSESSMENT AND PLAN:  1.  Peripheral arterial disease: The patient has nonhealing ulceration on the left big toe.  He is at high risk for further tissue loss especially with his diabetic status and continued tobacco use.  His left iliac artery is occluded and he likely does not have optimal blood flow to heal the ulceration.  Due to this, I recommend proceeding with abdominal aortogram with lower extremity runoff and possible endovascular intervention.  I discussed the procedure in details as well as risk and benefits.  Planned access is via right common femoral artery.  Hold Eliquis 2 days before the procedure and start aspirin 81 mg daily.  I discussed the high risk of bleeding given that he is on anticoagulation long-term.  2.  Coronary artery disease involving native coronary arteries without angina: He seems to be stable from a cardiac standpoint.  3.  Tobacco use: I discussed with him the importance of smoking cessation.  4.  Hyperlipidemia: I reviewed his most recent lipid profile done in August 2021.  It showed an LDL of 33 which is at target.  Continue atorvastatin 40 mg daily.  5.  Essential hypertension: Blood pressure is controlled.  6.  Permanent atrial fibrillation: Ventricular rate is controlled and he is on long-term anticoagulation with Eliquis which will be  held 2 days before angiogram.    Disposition:   Proceed with an angiogram next week and follow-up with me in 1 month.  Signed,  Tonica Brasington, MD  07/04/2021 1:38 PM    [facility]  

## 2021-07-04 NOTE — Progress Notes (Signed)
  Subjective:  Patient ID: Paul [name], adult    DOB: 05/22/1941,  MRN: [id number]  Chief Complaint  Patient presents with   Wound Check    Left foot graft application    80 y.o. adult returns with the above complaint. History confirmed with patient.    Objective:  Physical Exam: warm, good capillary refill, no trophic changes or ulcerative lesions and weak DP and PT pulses. 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  He has a full-thickness ulceration measuring 0.5 x 0.3 x 0.2 cm postdebridement on the distal hallux.  .  Lateral fifth metatarsal base ulceration has now fully healed      Radiographs left foot: Ulcer noted on plain film radiographs there is no evidence of bony erosion or osteomyelitis, no subcutaneous emphysema noted Assessment:   1. Diabetic ulcer of left midfoot associated with type 2 diabetes mellitus, with fat layer exposed ([place])   2. Pes planovalgus, acquired, left   3. Peripheral arterial disease ([place])            Plan:  Patient was evaluated and treated and all questions answered.  Patient educated on diabetes. Discussed proper diabetic foot care and discussed risks and complications of disease. Educated patient in depth on reasons to return to the office immediately should he/she discover anything concerning or new on the feet. All questions answered. Discussed proper shoes as well.   Ulcer left foot midfoot and hallux -This point his wound continues to stagnate despite advanced wound care techniques.  I recommend we explore all options of why this is not healing.  Has not had noninvasive arterial testing in some time.  Ordered new ABIs for [facility] site is they are going to the cardiologist on Monday.  Follow-up with me in 1 week after   Return in about 1 week (around 07/06/2021) for wound care, Graft application to wound.

## 2021-07-05 LAB — CBC
Hematocrit: 42.1 % (ref 37.5–51.0)
Hemoglobin: 13.7 g/dL (ref 13.0–17.7)
MCH: 27.1 pg (ref 26.6–33.0)
MCHC: 32.5 g/dL (ref 31.5–35.7)
MCV: 83 fL (ref 79–97)
Platelets: 219 10*3/uL (ref 150–450)
RBC: 5.05 x10E6/uL (ref 4.14–5.80)
RDW: 15.2 % (ref 11.6–15.4)
WBC: 8.1 10*3/uL (ref 3.4–10.8)

## 2021-07-05 LAB — BASIC METABOLIC PANEL
BUN/Creatinine Ratio: 23 (ref 10–24)
BUN: 17 mg/dL (ref 8–27)
CO2: 19 mmol/L — ABNORMAL LOW (ref 20–29)
Calcium: 9.1 mg/dL (ref 8.6–10.2)
Chloride: 106 mmol/L (ref 96–106)
Creatinine, Ser: 0.73 mg/dL — ABNORMAL LOW (ref 0.76–1.27)
Glucose: 107 mg/dL — ABNORMAL HIGH (ref 65–99)
Potassium: 4.7 mmol/L (ref 3.5–5.2)
Sodium: 146 mmol/L — ABNORMAL HIGH (ref 134–144)
eGFR: 91 mL/min/{1.73_m2} (ref >59)

## 2021-07-06 ENCOUNTER — Ambulatory Visit (INDEPENDENT_AMBULATORY_CARE_PROVIDER_SITE_OTHER): Payer: Medicare Other | Admitting: Podiatry

## 2021-07-06 ENCOUNTER — Other Ambulatory Visit: Payer: Self-pay

## 2021-07-06 DIAGNOSIS — I739 Peripheral vascular disease, unspecified: Secondary | ICD-10-CM

## 2021-07-06 DIAGNOSIS — L97422 Non-pressure chronic ulcer of left heel and midfoot with fat layer exposed: Secondary | ICD-10-CM | POA: Diagnosis not present

## 2021-07-06 DIAGNOSIS — E11621 Type 2 diabetes mellitus with foot ulcer: Secondary | ICD-10-CM

## 2021-07-07 ENCOUNTER — Telehealth: Payer: Self-pay | Admitting: Cardiovascular Disease

## 2021-07-07 NOTE — Telephone Encounter (Signed)
Pt c/o medication issue:  1. Name of Medication: jardiance 25 mg  2. How are you currently taking this medication (dosage and times per day)? Patient has not started taking this yet  3. Are you having a reaction (difficulty breathing--STAT)?   4. What is your medication issue? The patient's PCP started him on this medication today, but he has to have a procedure done 07/12/21. The wife wanted to know if he should still start this medication today or wait until after his procedure

## 2021-07-07 NOTE — Telephone Encounter (Signed)
Pts wife advised that he can start when his PCP has recommended to but to hold the morning of his 07/12/21 angiogram.

## 2021-07-10 ENCOUNTER — Telehealth: Payer: Self-pay | Admitting: Podiatry

## 2021-07-10 NOTE — Telephone Encounter (Signed)
Pts wife left message stating she had talked to the pcp and they had faxed over the documents for pts diabetic shoes to be made and asked me to call to confirm we got it..  I returned call and left message that I have not seen it yet and if they faxed it to the number on the forms it sometimes takes a day or two for it to upload. I have watched all day and it has not yet, I asked if she would check back with me on Wednesday.

## 2021-07-10 NOTE — Progress Notes (Signed)
  Subjective:  Patient ID: [name], adult    DOB: Jul 17, 1941,  MRN: [id number]  Chief Complaint  Patient presents with   Wound Check    Left foot graft application    80 y.o. adult returns with the above complaint. History confirmed with patient.  They completed the arterial testing  Objective:  Physical Exam: warm, good capillary refill, no trophic changes or ulcerative lesions and weak DP and PT pulses. 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  He has a full-thickness ulceration measuring 0.5 x 0.3 x 0.2 cm postdebridement on the distal hallux.  .  Lateral fifth metatarsal base ulceration has now fully healed   Noncompressible ABI and waveforms with ultrasound showing a CTO of the iliac segment   Radiographs left foot: Ulcer noted on plain film radiographs there is no evidence of bony erosion or osteomyelitis, no subcutaneous emphysema noted Assessment:   1. Diabetic ulcer of left midfoot associated with type 2 diabetes mellitus, with fat layer exposed ([place])   2. Peripheral arterial disease ([place])             Plan:  Patient was evaluated and treated and all questions answered.  Patient educated on diabetes. Discussed proper diabetic foot care and discussed risks and complications of disease. Educated patient in depth on reasons to return to the office immediately should he/she discover anything concerning or new on the feet. All questions answered. Discussed proper shoes as well.   Ulcer left foot midfoot and hallux -I reviewed his arterial testing with him he has a CTO of the iliac segment this was not visible on his CTAP that was done in April of last year for abdominal issues.  He has a angiogram scheduled next Wednesday with Dr. Fletcher Anon.  I will see him back 1 week after that -No debridement performed today.  We will resume debridement and grafting following his angiography  Return in about 2 weeks (around 07/20/2021)  for wound care.

## 2021-07-11 ENCOUNTER — Telehealth: Payer: Self-pay | Admitting: *Deleted

## 2021-07-11 NOTE — Addendum Note (Signed)
Addended by: Jacqulynn Cadet on: 07/11/2021 09:54 AM   Modules accepted: Orders

## 2021-07-11 NOTE — Telephone Encounter (Signed)
Cardiac catheterization scheduled at [facility] for: Wednesday July 12, 2021 8:30 AM [facility] Main Entrance A [facility]) at: 6:30 AM   No solid food after midnight prior to cath, clear liquids until 5 AM day of procedure.  Medication instructions: Hold: -Eliquis-none 07/10/21 until post procedure -Jardiance-AM of procedure -Metformin-day of procedure -Lasix-AM of procedure  Except hold medications morning medications can be taken pre-cath with sips of water including aspirin 81 mg.    Confirmed patient has responsible adult to drive home post procedure and be with patient first 24 hours after arriving home.  Patients are allowed one visitor in the waiting room during the time they are at the hospital for their procedure. Both patient and visitor must wear a mask once they enter the hospital.   Patient reports does not currently have any symptoms concerning for COVID-19 and no household members with COVID-19 like illness.              Reviewed procedure/mask/visitor instructions with patient's wife (at patient's request).

## 2021-07-12 ENCOUNTER — Encounter (HOSPITAL_COMMUNITY)
Admission: RE | Disposition: A | Discharge status: Home / Self Care | Payer: Self-pay | Source: Home / Self Care | Attending: Cardiovascular Disease

## 2021-07-12 ENCOUNTER — Other Ambulatory Visit: Payer: Self-pay

## 2021-07-12 ENCOUNTER — Ambulatory Visit (HOSPITAL_COMMUNITY)
Admission: RE | Admit: 2021-07-12 | Discharge: 2021-07-12 | Disposition: A | Discharge status: Home / Self Care | Payer: Medicare Other | Attending: Cardiovascular Disease | Admitting: Cardiovascular Disease

## 2021-07-12 ENCOUNTER — Telehealth: Payer: Self-pay | Admitting: Podiatry

## 2021-07-12 DIAGNOSIS — I70244 Atherosclerosis of native arteries of left leg with ulceration of heel and midfoot: Secondary | ICD-10-CM | POA: Diagnosis not present

## 2021-07-12 DIAGNOSIS — Z8249 Family history of ischemic heart disease and other diseases of the circulatory system: Secondary | ICD-10-CM | POA: Insufficient documentation

## 2021-07-12 DIAGNOSIS — F17210 Nicotine dependence, cigarettes, uncomplicated: Secondary | ICD-10-CM | POA: Diagnosis not present

## 2021-07-12 DIAGNOSIS — I4821 Permanent atrial fibrillation: Secondary | ICD-10-CM | POA: Diagnosis not present

## 2021-07-12 DIAGNOSIS — E785 Hyperlipidemia, unspecified: Secondary | ICD-10-CM | POA: Insufficient documentation

## 2021-07-12 DIAGNOSIS — I10 Essential (primary) hypertension: Secondary | ICD-10-CM | POA: Diagnosis not present

## 2021-07-12 DIAGNOSIS — E11621 Type 2 diabetes mellitus with foot ulcer: Secondary | ICD-10-CM | POA: Diagnosis not present

## 2021-07-12 DIAGNOSIS — E1151 Type 2 diabetes mellitus with diabetic peripheral angiopathy without gangrene: Secondary | ICD-10-CM | POA: Diagnosis not present

## 2021-07-12 DIAGNOSIS — Z885 Allergy status to narcotic agent status: Secondary | ICD-10-CM | POA: Diagnosis not present

## 2021-07-12 DIAGNOSIS — Z79899 Other long term (current) drug therapy: Secondary | ICD-10-CM | POA: Diagnosis not present

## 2021-07-12 DIAGNOSIS — Z7984 Long term (current) use of oral hypoglycemic drugs: Secondary | ICD-10-CM | POA: Insufficient documentation

## 2021-07-12 DIAGNOSIS — Z7982 Long term (current) use of aspirin: Secondary | ICD-10-CM | POA: Insufficient documentation

## 2021-07-12 DIAGNOSIS — Z7901 Long term (current) use of anticoagulants: Secondary | ICD-10-CM | POA: Diagnosis not present

## 2021-07-12 DIAGNOSIS — L97529 Non-pressure chronic ulcer of other part of left foot with unspecified severity: Secondary | ICD-10-CM | POA: Diagnosis not present

## 2021-07-12 DIAGNOSIS — I708 Atherosclerosis of other arteries: Secondary | ICD-10-CM | POA: Diagnosis not present

## 2021-07-12 DIAGNOSIS — I2510 Atherosclerotic heart disease of native coronary artery without angina pectoris: Secondary | ICD-10-CM | POA: Insufficient documentation

## 2021-07-12 DIAGNOSIS — I70245 Atherosclerosis of native arteries of left leg with ulceration of other part of foot: Secondary | ICD-10-CM | POA: Diagnosis not present

## 2021-07-12 DIAGNOSIS — I739 Peripheral vascular disease, unspecified: Secondary | ICD-10-CM

## 2021-07-12 HISTORY — PX: ABDOMINAL AORTOGRAM W/LOWER EXTREMITY: CATH118223

## 2021-07-12 HISTORY — PX: PERIPHERAL VASCULAR BALLOON ANGIOPLASTY: CATH118281

## 2021-07-12 LAB — GLUCOSE, CAPILLARY
Glucose-Capillary: 128 mg/dL — ABNORMAL HIGH (ref 70–99)
Glucose-Capillary: 132 mg/dL — ABNORMAL HIGH (ref 70–99)
Glucose-Capillary: 109 mg/dL — ABNORMAL HIGH (ref 70–99)

## 2021-07-12 LAB — POCT ACTIVATED CLOTTING TIME
Activated Clotting Time: 237 seconds
Activated Clotting Time: 219 seconds
Activated Clotting Time: 179 seconds

## 2021-07-12 SURGERY — ABDOMINAL AORTOGRAM W/LOWER EXTREMITY
Anesthesia: LOCAL

## 2021-07-12 MED ORDER — LIDOCAINE HCL (PF) 1 % IJ SOLN
INTRAMUSCULAR | Status: AC
  Filled 2021-07-12: qty 30

## 2021-07-12 MED ORDER — MIDAZOLAM HCL 2 MG/2ML IJ SOLN
INTRAMUSCULAR | Status: DC | PRN
  Administered 2021-07-12 (×2): 1 mg via INTRAVENOUS

## 2021-07-12 MED ORDER — SODIUM CHLORIDE 0.9 % IV SOLN: INTRAVENOUS | Status: DC

## 2021-07-12 MED ORDER — ACETAMINOPHEN 325 MG PO TABS
ORAL_TABLET | ORAL | Status: AC
  Filled 2021-07-12: qty 2

## 2021-07-12 MED ORDER — HEPARIN (PORCINE) IN NACL 1000-0.9 UT/500ML-% IV SOLN
INTRAVENOUS | Status: AC
  Filled 2021-07-12: qty 1000

## 2021-07-12 MED ORDER — MIDAZOLAM HCL 2 MG/2ML IJ SOLN
INTRAMUSCULAR | Status: AC
  Filled 2021-07-12: qty 2

## 2021-07-12 MED ORDER — ACETAMINOPHEN 325 MG PO TABS
650.0000 mg | ORAL_TABLET | ORAL | Status: DC | PRN
  Administered 2021-07-12 (×2): 650 mg via ORAL
  Filled 2021-07-12: qty 2

## 2021-07-12 MED ORDER — FENTANYL CITRATE (PF) 100 MCG/2ML IJ SOLN
INTRAMUSCULAR | Status: DC | PRN
  Administered 2021-07-12 (×3): 25 ug via INTRAVENOUS

## 2021-07-12 MED ORDER — LABETALOL HCL 5 MG/ML IV SOLN: 10.0000 mg | INTRAVENOUS | Status: DC | PRN

## 2021-07-12 MED ORDER — SODIUM CHLORIDE 0.9 % IV SOLN: 250.0000 mL | INTRAVENOUS | Status: DC | PRN

## 2021-07-12 MED ORDER — SODIUM CHLORIDE 0.9% FLUSH: 3.0000 mL | INTRAVENOUS | Status: DC | PRN

## 2021-07-12 MED ORDER — SODIUM CHLORIDE 0.9 % IV SOLN: INTRAVENOUS | Status: AC

## 2021-07-12 MED ORDER — SODIUM CHLORIDE 0.9% FLUSH: 3.0000 mL | Freq: Two times a day (BID) | INTRAVENOUS | Status: DC

## 2021-07-12 MED ORDER — HEPARIN SODIUM (PORCINE) 1000 UNIT/ML IJ SOLN
INTRAMUSCULAR | Status: DC | PRN
  Administered 2021-07-12: 7000 [IU] via INTRAVENOUS
  Administered 2021-07-12: 2000 [IU] via INTRAVENOUS

## 2021-07-12 MED ORDER — HYDRALAZINE HCL 20 MG/ML IJ SOLN
5.0000 mg | INTRAMUSCULAR | Status: DC | PRN
Start: 2021-07-12 — End: 2021-07-12
  Administered 2021-07-12: 5 mg via INTRAVENOUS

## 2021-07-12 MED ORDER — ASPIRIN 81 MG PO CHEW: 81.0000 mg | CHEWABLE_TABLET | ORAL | Status: DC

## 2021-07-12 MED ORDER — HEPARIN SODIUM (PORCINE) 1000 UNIT/ML IJ SOLN
INTRAMUSCULAR | Status: AC
  Filled 2021-07-12: qty 1

## 2021-07-12 MED ORDER — LIDOCAINE HCL (PF) 1 % IJ SOLN
INTRAMUSCULAR | Status: DC | PRN
  Administered 2021-07-12 (×2): 15 mL via SUBCUTANEOUS

## 2021-07-12 MED ORDER — FENTANYL CITRATE (PF) 100 MCG/2ML IJ SOLN
INTRAMUSCULAR | Status: AC
  Filled 2021-07-12: qty 2

## 2021-07-12 MED ORDER — ONDANSETRON HCL 4 MG/2ML IJ SOLN
4.0000 mg | Freq: Four times a day (QID) | INTRAMUSCULAR | Status: DC | PRN
  Administered 2021-07-12: 4 mg via INTRAVENOUS
  Filled 2021-07-12: qty 2

## 2021-07-12 MED ORDER — IODIXANOL 320 MG/ML IV SOLN
INTRAVENOUS | Status: DC | PRN
  Administered 2021-07-12: 85 mL via INTRA_ARTERIAL

## 2021-07-12 MED ORDER — HYDRALAZINE HCL 20 MG/ML IJ SOLN
INTRAMUSCULAR | Status: AC
  Filled 2021-07-12: qty 1

## 2021-07-12 SURGICAL SUPPLY — 22 items
BALLN MUSTANG 5.0X20 75 (BALLOONS) ×3
BALLOON MUSTANG 5.0X20 75 (BALLOONS) IMPLANT
CATH ANGIO 5F PIGTAIL 65CM (CATHETERS) ×3 IMPLANT
CATH CROSS OVER TEMPO 5F (CATHETERS) ×1 IMPLANT
CATH NAVICROSS ANG 65CM (CATHETERS) ×2 IMPLANT
CATH OMNI FLUSH 5F 65CM (CATHETERS) ×2 IMPLANT
CATHETER NAVICROSS ANG 65CM (CATHETERS) ×6
GLIDEWIRE ADV .035X260CM (WIRE) ×1 IMPLANT
GUIDEWIRE ZILIENT 30G 018 (WIRE) ×6 IMPLANT
KIT ENCORE 26 ADVANTAGE (KITS) ×3 IMPLANT
KIT MICROPUNCTURE NIT STIFF (SHEATH) ×1 IMPLANT
KIT PV (KITS) ×3 IMPLANT
SHEATH BRITE TIP 7FR 35CM (SHEATH) ×1 IMPLANT
SHEATH PINNACLE 5F 10CM (SHEATH) ×1 IMPLANT
SHEATH PINNACLE 7F 10CM (SHEATH) IMPLANT
SHEATH PINNACLE ST 6F 45CM (SHEATH) IMPLANT
STOPCOCK MORSE 400PSI 3WAY (MISCELLANEOUS) ×1 IMPLANT
SYR MEDRAD MARK 7 150ML (SYRINGE) ×3 IMPLANT
TRANSDUCER W/STOPCOCK (MISCELLANEOUS) ×3 IMPLANT
TRAY PV CATH (CUSTOM PROCEDURE TRAY) ×3 IMPLANT
WIRE G V18X300CM (WIRE) ×3 IMPLANT
WIRE HITORQ VERSACORE ST 145CM (WIRE) ×2 IMPLANT

## 2021-07-12 NOTE — Progress Notes (Signed)
Pt stood at bedside with RN. Pt unable to ambulate without walker. Pt's bilateral groin sites remained clean, dry, and intact at time of DC. Discharged home with his wife who will drive and stay with pt x 24 hrs.

## 2021-07-12 NOTE — Interval H&P Note (Signed)
History and Physical Interval Note:  07/12/2021 8:32 AM  Paul [name]  has presented today for surgery, with the diagnosis of pad.  The various methods of treatment have been discussed with the patient and family. After consideration of risks, benefits and other options for treatment, the patient has consented to  Procedure(s): ABDOMINAL AORTOGRAM W/LOWER EXTREMITY (N/A) as a surgical intervention.  The patient's history has been reviewed, patient examined, no change in status, stable for surgery.  I have reviewed the patient's chart and labs.  Questions were answered to the patient's satisfaction.     Kathlyn Sacramento

## 2021-07-12 NOTE — Progress Notes (Signed)
Site area: Left groin a 7 french long sheath was removed  Site Prior to Removal:  Level 0  Pressure Applied For 20 MINUTES    Bedrest Beginning at 1200 X 6 hours  Manual:   Yes.    Patient Status During Pull:  stable  Post Pull Groin Site:  Level 0  Post Pull Instructions Given:  Yes.    Post Pull Pulses Present:  Yes.    Dressing Applied:  Yes.    Comments:

## 2021-07-12 NOTE — Telephone Encounter (Signed)
Left message for pt that we did receive the documents for the diabetic shoes and I have shipped the molds out as of today. I will call pt when they come in to schedule an appt to pick them up.

## 2021-07-12 NOTE — Progress Notes (Signed)
Site area: Right groin a 5 french arterial sheath was removed by [place]  Site Prior to Removal:  Level 0  Pressure Applied For 20 MINUTES    Minutes Beginning at 1200pm X 6 hours  Manual:   Yes.    Patient Status During Pull:  stable  Post Pull Groin Site:  Level 0  Post Pull Instructions Given:  Yes.    Post Pull Pulses Present:  Yes.    Dressing Applied:  Yes.    Comments:

## 2021-07-13 ENCOUNTER — Ambulatory Visit: Payer: Medicare Other | Admitting: Podiatry

## 2021-07-13 ENCOUNTER — Encounter (HOSPITAL_COMMUNITY): Payer: Self-pay | Admitting: Cardiovascular Disease

## 2021-07-14 ENCOUNTER — Encounter: Payer: Self-pay | Admitting: Podiatry

## 2021-07-14 ENCOUNTER — Telehealth: Payer: Self-pay | Admitting: Cardiovascular Disease

## 2021-07-14 NOTE — Telephone Encounter (Signed)
The patient's wife has been made aware of the instructions and has been advised to call back with any questions.     You are scheduled for a Peripheral Angiogram on Wednesday, September 7 with Dr. Kathlyn [place].  1. Please arrive at the [facility] (Main Entrance A) at [place]: [address] [place], [place] [address] at 8:30 AM (This time is two hours before your procedure to ensure your preparation). Free valet parking service is available.   Special note: Every effort is made to have your procedure done on time. Please understand that emergencies sometimes delay scheduled procedures.  2. Diet: Do not eat solid foods after midnight.  The patient may have clear liquids until 5am upon the day of the procedure.  3. Labs: You will need to have blood drawn on the day of the procedure  4. Medication instructions in preparation for your procedure: Hold the Eliquis two days prior (Monday and Tuesday 9/5 and 9/6) and on the morning of the procedure. Hold the furosemide the morning of the procedure Hold the Metformin the morning of the procedure and then 48 hours after. Hold all diabetic medication the morning of the procedure (Metformin and Jardiance).   On the morning of your procedure, take your Aspirin and any morning medicines NOT listed above.  You may use sips of water.  5. Plan for one night stay--bring personal belongings. 6. Bring a current list of your medications and current insurance cards. 7. You MUST have a responsible person to drive you home. 8. Someone MUST be with you the first 24 hours after you arrive home or your discharge will be delayed. 9. Please wear clothes that are easy to get on and off and wear slip-on shoes.  Thank you for allowing [place] to care for you!   -- [place] [facility]

## 2021-07-14 NOTE — Telephone Encounter (Signed)
Pt's wife is f/u on phone call from Wednesday 07/12/21. Please advise pt's wife Santiago Glad [phone]

## 2021-07-18 ENCOUNTER — Telehealth: Payer: Self-pay | Admitting: *Deleted

## 2021-07-18 NOTE — Telephone Encounter (Signed)
Abdominal aortogram scheduled at [facility] for: Wednesday July 19, 2021 10:30 AM [place] Entrance A [facility]) at: 8:30 AM   No solid food after midnight prior to cath, clear liquids until 5 AM day of procedure.  Medication instructions: Hold: -Eliquis-none 07/17/21 until post procedure -Metformin-day of procedure and 48 hours post procedure -Jardiance-AM of procedure -Lasix-AM of procedure  Except hold medications morning medications can be taken pre-cath with sips of water including aspirin 81 mg.    Confirmed patient has responsible adult to drive home post procedure and be with patient first 24 hours after arriving home.  Patients are allowed one visitor in the waiting room during the time they are at the hospital for their procedure. Both patient and visitor must wear a mask once they enter the hospital.   Patient reports does not currently have any symptoms concerning for COVID-19 and no household members with COVID-19 like illness.        Reviewed procedure/mask/visitor instructions with patient and patient's wife.

## 2021-07-19 ENCOUNTER — Ambulatory Visit (HOSPITAL_COMMUNITY)
Admission: RE | Admit: 2021-07-19 | Discharge: 2021-07-19 | Disposition: A | Discharge status: Home / Self Care | Payer: Medicare Other | Attending: Cardiovascular Disease | Admitting: Cardiovascular Disease

## 2021-07-19 ENCOUNTER — Encounter (HOSPITAL_COMMUNITY)
Admission: RE | Disposition: A | Discharge status: Home / Self Care | Payer: Self-pay | Source: Home / Self Care | Attending: Cardiovascular Disease

## 2021-07-19 ENCOUNTER — Other Ambulatory Visit: Payer: Self-pay

## 2021-07-19 ENCOUNTER — Encounter (HOSPITAL_COMMUNITY): Payer: Self-pay | Admitting: Cardiovascular Disease

## 2021-07-19 DIAGNOSIS — Z79899 Other long term (current) drug therapy: Secondary | ICD-10-CM | POA: Diagnosis not present

## 2021-07-19 DIAGNOSIS — F17210 Nicotine dependence, cigarettes, uncomplicated: Secondary | ICD-10-CM | POA: Insufficient documentation

## 2021-07-19 DIAGNOSIS — J449 Chronic obstructive pulmonary disease, unspecified: Secondary | ICD-10-CM | POA: Insufficient documentation

## 2021-07-19 DIAGNOSIS — E785 Hyperlipidemia, unspecified: Secondary | ICD-10-CM | POA: Diagnosis not present

## 2021-07-19 DIAGNOSIS — L97529 Non-pressure chronic ulcer of other part of left foot with unspecified severity: Secondary | ICD-10-CM | POA: Diagnosis not present

## 2021-07-19 DIAGNOSIS — Z8249 Family history of ischemic heart disease and other diseases of the circulatory system: Secondary | ICD-10-CM | POA: Insufficient documentation

## 2021-07-19 DIAGNOSIS — I4821 Permanent atrial fibrillation: Secondary | ICD-10-CM | POA: Insufficient documentation

## 2021-07-19 DIAGNOSIS — Z955 Presence of coronary angioplasty implant and graft: Secondary | ICD-10-CM | POA: Insufficient documentation

## 2021-07-19 DIAGNOSIS — Z7984 Long term (current) use of oral hypoglycemic drugs: Secondary | ICD-10-CM | POA: Insufficient documentation

## 2021-07-19 DIAGNOSIS — E1151 Type 2 diabetes mellitus with diabetic peripheral angiopathy without gangrene: Secondary | ICD-10-CM | POA: Insufficient documentation

## 2021-07-19 DIAGNOSIS — I10 Essential (primary) hypertension: Secondary | ICD-10-CM | POA: Diagnosis not present

## 2021-07-19 DIAGNOSIS — I708 Atherosclerosis of other arteries: Secondary | ICD-10-CM | POA: Insufficient documentation

## 2021-07-19 DIAGNOSIS — I745 Embolism and thrombosis of iliac artery: Secondary | ICD-10-CM | POA: Diagnosis not present

## 2021-07-19 DIAGNOSIS — I2510 Atherosclerotic heart disease of native coronary artery without angina pectoris: Secondary | ICD-10-CM | POA: Diagnosis not present

## 2021-07-19 DIAGNOSIS — E11621 Type 2 diabetes mellitus with foot ulcer: Secondary | ICD-10-CM | POA: Diagnosis not present

## 2021-07-19 DIAGNOSIS — Z7982 Long term (current) use of aspirin: Secondary | ICD-10-CM | POA: Diagnosis not present

## 2021-07-19 DIAGNOSIS — Z7901 Long term (current) use of anticoagulants: Secondary | ICD-10-CM | POA: Diagnosis not present

## 2021-07-19 DIAGNOSIS — Z885 Allergy status to narcotic agent status: Secondary | ICD-10-CM | POA: Diagnosis not present

## 2021-07-19 DIAGNOSIS — I70212 Atherosclerosis of native arteries of extremities with intermittent claudication, left leg: Secondary | ICD-10-CM | POA: Diagnosis not present

## 2021-07-19 HISTORY — PX: PELVIC ANGIOGRAPHY: CATH118254

## 2021-07-19 LAB — CBC
HCT: 40.1 % (ref 39.0–52.0)
Hemoglobin: 12.5 g/dL — ABNORMAL LOW (ref 13.0–17.0)
MCH: 27.7 pg (ref 26.0–34.0)
MCHC: 31.2 g/dL (ref 30.0–36.0)
MCV: 88.7 fL (ref 80.0–100.0)
Platelets: 262 10*3/uL (ref 150–400)
RBC: 4.52 MIL/uL (ref 4.22–5.81)
RDW: 14.9 % (ref 11.5–15.5)
WBC: 7.9 10*3/uL (ref 4.0–10.5)
nRBC: 0 % (ref 0.0–0.2)

## 2021-07-19 LAB — BASIC METABOLIC PANEL
Anion gap: 11 (ref 5–15)
BUN: 18 mg/dL (ref 8–23)
CO2: 19 mmol/L — ABNORMAL LOW (ref 22–32)
Calcium: 9.1 mg/dL (ref 8.9–10.3)
Chloride: 106 mmol/L (ref 98–111)
Creatinine, Ser: 0.79 mg/dL (ref 0.61–1.24)
GFR, Estimated: >60 mL/min (ref >60)
Glucose, Bld: 142 mg/dL — ABNORMAL HIGH (ref 70–99)
Potassium: 4.6 mmol/L (ref 3.5–5.1)
Sodium: 136 mmol/L (ref 135–145)

## 2021-07-19 LAB — POCT ACTIVATED CLOTTING TIME: Activated Clotting Time: 167 seconds

## 2021-07-19 LAB — GLUCOSE, CAPILLARY: Glucose-Capillary: 135 mg/dL — ABNORMAL HIGH (ref 70–99)

## 2021-07-19 SURGERY — PELVIC ANGIOGRAPHY
Anesthesia: LOCAL | Laterality: Left

## 2021-07-19 MED ORDER — NITROGLYCERIN 1 MG/10 ML FOR IR/CATH LAB
INTRA_ARTERIAL | Status: AC
  Filled 2021-07-19: qty 10

## 2021-07-19 MED ORDER — MIDAZOLAM HCL 2 MG/2ML IJ SOLN
INTRAMUSCULAR | Status: AC
  Filled 2021-07-19: qty 2

## 2021-07-19 MED ORDER — MIDAZOLAM HCL 2 MG/2ML IJ SOLN
INTRAMUSCULAR | Status: DC | PRN
  Administered 2021-07-19: 1 mg via INTRAVENOUS

## 2021-07-19 MED ORDER — SODIUM CHLORIDE 0.9% FLUSH: 3.0000 mL | INTRAVENOUS | Status: DC | PRN

## 2021-07-19 MED ORDER — LIDOCAINE HCL (PF) 1 % IJ SOLN
INTRAMUSCULAR | Status: AC
  Filled 2021-07-19: qty 30

## 2021-07-19 MED ORDER — SODIUM CHLORIDE 0.9 % IV SOLN: 250.0000 mL | INTRAVENOUS | Status: DC | PRN

## 2021-07-19 MED ORDER — HEPARIN (PORCINE) IN NACL 1000-0.9 UT/500ML-% IV SOLN
INTRAVENOUS | Status: AC
  Filled 2021-07-19: qty 500

## 2021-07-19 MED ORDER — SODIUM CHLORIDE 0.9 % WEIGHT BASED INFUSION
3.0000 mL/kg/h | INTRAVENOUS | Status: AC
  Administered 2021-07-19: 3 mL/kg/h via INTRAVENOUS

## 2021-07-19 MED ORDER — IODIXANOL 320 MG/ML IV SOLN
INTRAVENOUS | Status: DC | PRN
  Administered 2021-07-19: 7 mL

## 2021-07-19 MED ORDER — ASPIRIN 81 MG PO CHEW: 81.0000 mg | CHEWABLE_TABLET | ORAL | Status: DC

## 2021-07-19 MED ORDER — ONDANSETRON HCL 4 MG/2ML IJ SOLN: 4.0000 mg | Freq: Four times a day (QID) | INTRAMUSCULAR | Status: DC | PRN

## 2021-07-19 MED ORDER — FENTANYL CITRATE (PF) 100 MCG/2ML IJ SOLN
INTRAMUSCULAR | Status: AC
  Filled 2021-07-19: qty 2

## 2021-07-19 MED ORDER — HEPARIN SODIUM (PORCINE) 1000 UNIT/ML IJ SOLN
INTRAMUSCULAR | Status: DC | PRN
  Administered 2021-07-19 (×3): 2000 [IU] via INTRAVENOUS

## 2021-07-19 MED ORDER — HEPARIN (PORCINE) IN NACL 1000-0.9 UT/500ML-% IV SOLN
INTRAVENOUS | Status: DC | PRN
  Administered 2021-07-19 (×2): 500 mL

## 2021-07-19 MED ORDER — SODIUM CHLORIDE 0.9 % WEIGHT BASED INFUSION: 1.0000 mL/kg/h | INTRAVENOUS | Status: DC

## 2021-07-19 MED ORDER — SODIUM CHLORIDE 0.9% FLUSH: 3.0000 mL | Freq: Two times a day (BID) | INTRAVENOUS | Status: DC

## 2021-07-19 MED ORDER — ACETAMINOPHEN 325 MG PO TABS
650.0000 mg | ORAL_TABLET | ORAL | Status: DC | PRN
  Administered 2021-07-19: 650 mg via ORAL
  Filled 2021-07-19: qty 2

## 2021-07-19 MED ORDER — LIDOCAINE HCL (PF) 1 % IJ SOLN
INTRAMUSCULAR | Status: DC | PRN
  Administered 2021-07-19: 15 mL
  Administered 2021-07-19: 5 mL

## 2021-07-19 MED ORDER — NITROGLYCERIN 1 MG/10 ML FOR IR/CATH LAB
INTRA_ARTERIAL | Status: DC | PRN
  Administered 2021-07-19: 200 ug via INTRA_ARTERIAL

## 2021-07-19 MED ORDER — LABETALOL HCL 5 MG/ML IV SOLN
10.0000 mg | INTRAVENOUS | Status: DC | PRN
Start: 2021-07-19 — End: 2021-07-19

## 2021-07-19 MED ORDER — FENTANYL CITRATE (PF) 100 MCG/2ML IJ SOLN
INTRAMUSCULAR | Status: DC | PRN
  Administered 2021-07-19 (×3): 25 ug via INTRAVENOUS

## 2021-07-19 MED ORDER — SODIUM CHLORIDE 0.9 % IV SOLN: INTRAVENOUS | Status: AC

## 2021-07-19 SURGICAL SUPPLY — 15 items
CATH INFINITI VERT 5FR 125CM (CATHETERS) ×1 IMPLANT
CATH NAVICROSS ANGLED 135CM (MICROCATHETER) ×1 IMPLANT
CATH QUICKCROSS .018X135CM (MICROCATHETER) ×1 IMPLANT
DEVICE ONE SNARE 15MM (VASCULAR PRODUCTS) ×1 IMPLANT
GLIDEWIRE ADV .035X260CM (WIRE) ×2 IMPLANT
GUIDEWIRE ZILIENT 30G 018 (WIRE) ×1 IMPLANT
KIT MICROPUNCTURE NIT STIFF (SHEATH) ×1 IMPLANT
KIT PV (KITS) ×2 IMPLANT
SHEATH BRITE TIP 7FR 35CM (SHEATH) ×1 IMPLANT
SHEATH PINNACLE 5F 10CM (SHEATH) ×1 IMPLANT
SYR MEDRAD MARK 7 150ML (SYRINGE) ×2 IMPLANT
TRANSDUCER W/STOPCOCK (MISCELLANEOUS) ×2 IMPLANT
TRAY PV CATH (CUSTOM PROCEDURE TRAY) ×2 IMPLANT
WIRE HI TORQ VERSACORE J 260CM (WIRE) ×1 IMPLANT
WIRE HITORQ VERSACORE ST 145CM (WIRE) ×1 IMPLANT

## 2021-07-19 NOTE — Consult Note (Signed)
Hospital Consult    Reason for Consult: Evaluate for right to left femorofemoral bypass, left foot wound Referring Physician: Dr. Fletcher Anon MRN #:  [id number]  History of Present Illness: This is a 80 y.o. adult with history of diabetes, hypertension, hyperlipidemia, A. fib on Eliquis, COPD, and tobacco abuse that vascular surgery has been consulted for right to left femorofemoral bypass.  Patient has a wound on his left great toe.  He states this wound has been present for about 3 months.  Initially evaluated by podiatry then referred to Dr. Fletcher Anon.  He has had both retrograde and antegrade attempts from brachial access to cross left common iliac occlusion.  Retrograde attempt was unsuccessful and antegrade attempt from brachial access today the lesion was crossed but could not get any catheter to track.  Patient denies any symptoms in the right leg.  He is ambulatory.  He is still smoking.  Previous abdominal surgery includes cholecystectomy.  ABIs are noncompressible.  Does have a biphasic waveform on the right with a toe pressure of 98.  Left has dampened monophasic flow with a reduced toe pressure of 38  Past Medical History:  Diagnosis Date   Asthma    Atrial fibrillation (HCC)    CAD (coronary artery disease)    Prior PTCA   COPD (chronic obstructive pulmonary disease) (HCC)    Diabetes mellitus    GERD (gastroesophageal reflux disease)    Hard of hearing    Hyperlipidemia    Hypertension    lung ca dx'd 01/2006   Nephrolithiasis    Neuropathy    Osteopenia    Pulmonary embolus (HCC)    Tibia/fibula fracture 07/2016   Left   Tremor, essential     Past Surgical History:  Procedure Laterality Date   ABDOMINAL AORTOGRAM W/LOWER EXTREMITY N/A 07/12/2021   Procedure: ABDOMINAL AORTOGRAM W/LOWER EXTREMITY;  Surgeon: Wellington Hampshire, MD;  Location: [facility];  Service: Cardiovascular;  Laterality: N/A;   Angioplasty  Approx [place]     enchondroma Right 2016   Humerus   FIXATION KYPHOPLASTY     LUNG CANCER SURGERY  01/2006   Small excision of left lung tissue; mesh with radioactive seeds (per pt report)   ORIF TIBIA FRACTURE Left 08/22/2016   Procedure: OPEN REDUCTION INTERNAL FIXATION (ORIF) TIBIA FRACTURE;  Surgeon: Nicholes Stairs, MD;  Location: [facility];  Service: Orthopedics;  Laterality: Left;   PERIPHERAL VASCULAR BALLOON ANGIOPLASTY  07/12/2021   Procedure: PERIPHERAL VASCULAR BALLOON ANGIOPLASTY;  Surgeon: Wellington Hampshire, MD;  Location: [facility];  Service: Cardiovascular;;  aborted PTA of left common iliac   ROTATOR CUFF TEAR  2016   Tibia/Fibula Repair Left 07/2016   TONSILLECTOMY      Allergies  Allergen Reactions   Codeine Anaphylaxis   Phenobarbital Hypertension    Prior to Admission medications   Medication Sig Start Date End Date Taking? Authorizing Provider  acetaminophen (TYLENOL) 500 MG tablet Take 1,000 mg by mouth every 8 (eight) hours as needed for moderate pain.   Yes [provider]  albuterol (VENTOLIN HFA) 108 (90 Base) MCG/ACT inhaler Inhale 2 puffs into the lungs every 2 (two) hours as needed for wheezing or shortness of breath.    Yes [provider]  aspirin EC 81 MG tablet Take 81 mg by mouth daily. Swallow whole.   Yes [provider]  atorvastatin (LIPITOR) 40 MG tablet TAKE 1  TABLET BY MOUTH AT  BEDTIME Patient taking differently: Take 40 mg by mouth daily. 10/06/18  Yes Lelon Perla, MD  calcium carbonate (OS-CAL) 600 MG TABS tablet Take 600 mg by mouth every evening.   Yes [provider]  Cholecalciferol (VITAMIN D3) 20 MCG (800 UNIT) TABS Take 800 Units by mouth daily.   Yes [provider]  empagliflozin (JARDIANCE) 25 MG TABS tablet Take 25 mg by mouth daily.   Yes [provider]  fexofenadine (ALLEGRA) 180 MG tablet Take 180 mg by mouth daily.   Yes [provider]   fluticasone (FLONASE) 50 MCG/ACT nasal spray Place 1 spray into the nose 2 (two) times daily.   Yes [provider]  furosemide (LASIX) 20 MG tablet TAKE 1 TABLET BY MOUTH  DAILY Patient taking differently: Take 20 mg by mouth daily. 12/05/18  Yes Tanda Rockers, MD  gabapentin (NEURONTIN) 300 MG capsule Take 1,200 mg by mouth at bedtime. 4 at bedtime   Yes [provider]  guaiFENesin (MUCINEX) 600 MG 12 hr tablet Take 1,200 mg by mouth daily with breakfast.    Yes [provider]  losartan (COZAAR) 50 MG tablet Take 1 tablet (50 mg total) by mouth daily. Patient taking differently: Take 50 mg by mouth daily with breakfast. 10/21/13  Yes Tanda Rockers, MD  metFORMIN (GLUCOPHAGE) 1000 MG tablet Take 1,000 mg by mouth 2 (two) times daily with a meal.   Yes [provider]  metoCLOPramide (REGLAN) 5 MG tablet Take 5 mg by mouth 3 (three) times daily before meals. 04/01/21  Yes [provider]  Multiple Vitamins-Minerals (CENTRUM SILVER ADULT 50+) TABS Take 1 tablet by mouth daily with breakfast.   Yes [provider]  omeprazole (PRILOSEC OTC) 20 MG tablet Take 20 mg by mouth daily as needed (heartburn).   Yes [provider]  vitamin C (ASCORBIC ACID) 250 MG tablet Take 250 mg by mouth daily.   Yes [provider]  ELIQUIS 5 MG TABS tablet TAKE 1 TABLET BY MOUTH  TWICE DAILY Patient taking differently: Take 5 mg by mouth 2 (two) times daily. 01/09/21   Deberah Pelton, NP  OXYGEN Inhale 3-4 L into the lungs continuous. 4L of oxygen at night and 3L of oxygen when walking long distances    [provider]  [facility] injection  01/03/21   [provider]    Social History   Socioeconomic History   Marital status: Married    Spouse name: Santiago Glad   Number of children: 2   Years of education: college   Highest education level: Not on file  Occupational History    Employer: RETIRED    Comment: Retired from [employer]  Tobacco Use   Smoking status: Every Day    Packs/day: 0.50    Years: 59.00    Pack years: 29.50    Types: Cigarettes   Smokeless tobacco: Never  Substance and Sexual Activity   Alcohol use: Not Currently    Alcohol/week: 1.0 standard drink    Types: 1 Glasses of wine per week   Drug use: Never   Sexual activity: Never  Other Topics Concern   Not on file  Social History Narrative   Patient lives at home with his wife Santiago Glad).   Retired.   Education- College    Right handed.   Caffeine- three cups of coffee daily.   Social Determinants of Health   Financial Resource Strain: Not  on file  Food Insecurity: Not on file  Transportation Needs: Not on file  Physical Activity: Not on file  Stress: Not on file  Social Connections: Not on file  Intimate Partner Violence: Not on file     Family History  Problem Relation Age of Onset   Heart disease Mother    Tremor Mother    Dementia Mother    Heart Problems Father     ROS: _0  Positive   _1  Negative   _2  All sytems reviewed and are negative  Cardiovascular: _3  chest pain/pressure _4  palpitations _5  SOB lying flat _6  DOE _7  pain in legs while walking _8  pain in legs at rest _9  pain in legs at night _10  non-healing ulcers _11  hx of DVT _12  swelling in legs  Pulmonary: _13  productive cough _14  asthma/wheezing _15  home O2  Neurologic: _16  weakness in _17  arms _18  legs _19  numbness in _20  arms _21  legs _22  hx of CVA _23  mini stroke _24 difficulty speaking or slurred speech _25  temporary loss of vision in one eye _26  dizziness  Hematologic: _27  hx of cancer _28  bleeding problems _29  problems with blood clotting easily  Endocrine:   _30  diabetes _31  thyroid disease  GI _32  vomiting blood _33  blood in stool  GU: _34  CKD/renal failure _35  HD--_36  M/W/F or _37  T/T/S _38  burning with urination _39  blood in urine  Psychiatric: _40  anxiety _41  depression  Musculoskeletal: _42  arthritis _43  joint pain  Integumentary: _44   rashes _45  ulcers  Constitutional: _46  fever _47  chills   Physical Examination  Vitals:   07/19/21 1305 07/19/21 1310  BP:    Pulse: 78 86  Resp:    SpO2: 100% 92%   Body mass index is 29.94 kg/m.  General:  NAD Gait: Not observed HENT: WNL, normocephalic Pulmonary: normal non-labored breathing, without Rales, rhonchi,  wheezing Cardiac: regular, without  Murmurs, rubs or gallops Abdomen: soft, NT/ND Vascular Exam/Pulses: Palpable right femoral pulse.  No palpable left femoral pulse. No palpable right pedal pulses or left pedal pulses Left foot wound pictured below Extremities: Left great toe ulcer pictured below Musculoskeletal: no muscle wasting or atrophy  Neurologic: A&O X 3; Appropriate Affect ; SENSATION: normal; MOTOR FUNCTION:  moving all extremities equally. Speech is fluent/normal      CBC    Component Value Date/Time   WBC 7.9 07/19/2021 0840   RBC 4.52 07/19/2021 0840   HGB 12.5 (L) 07/19/2021 0840   HGB 13.7 07/04/2021 1212   HGB 14.1 01/13/2014 0922   HCT 40.1 07/19/2021 0840   HCT 42.1 07/04/2021 1212   HCT 42.8 01/13/2014 0922   PLT 262 07/19/2021 0840   PLT 219 07/04/2021 1212   MCV 88.7 07/19/2021 0840   MCV 83 07/04/2021 1212   MCV 83.9 01/13/2014 0922   MCH 27.7 07/19/2021 0840   MCHC 31.2 07/19/2021 0840   RDW 14.9 07/19/2021 0840   RDW 15.2 07/04/2021 1212   RDW 15.3 (H) 01/13/2014 0922   LYMPHSABS 1.4 08/10/2016 0443   LYMPHSABS 1.5 01/13/2014 0922   MONOABS 1.1 (H) 08/10/2016 0443   MONOABS 0.8 01/13/2014 0922   EOSABS 0.3 08/10/2016 0443   EOSABS 0.2 01/13/2014 0922   BASOSABS 0.0 08/10/2016 0443   BASOSABS 0.1 01/13/2014 0922    BMET    Component Value Date/Time   NA 136 07/19/2021 0840   NA 146 (H) 07/04/2021 1212   NA 140 01/13/2014 0922   K 4.6 07/19/2021 0840   K 4.6 01/13/2014 9622  CL 106 07/19/2021 0840   CL 105 01/13/2013 0856   CO2 19 (L) 07/19/2021 0840   CO2 26 01/13/2014 0922   GLUCOSE 142 (H)  07/19/2021 0840   GLUCOSE 112 01/13/2014 0922   GLUCOSE 174 (H) 01/13/2013 0856   BUN 18 07/19/2021 0840   BUN 17 07/04/2021 1212   BUN 20.1 01/13/2014 0922   CREATININE 0.79 07/19/2021 0840   CREATININE 0.9 01/13/2014 0922   CALCIUM 9.1 07/19/2021 0840   CALCIUM 9.9 01/13/2014 0922   GFRNONAA >60 07/19/2021 0840   GFRAA 104 01/06/2021 1512    COAGS: Lab Results  Component Value Date   INR 0.99 08/09/2016   INR 0.99 03/26/2012   INR 0.95 03/25/2012     Non-Invasive Vascular Imaging:     Angiogram imaging reviewed from 07/12/2021 and 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  The left common femoral profunda as well as SFA popliteal are all patent with apparent three-vessel runoff that was evaluated to just below the knee.  Inflow on the right looks good with a patent infrarenal aorta and patent right iliacs.  He does have some moderate common femoral disease on the right.  ASSESSMENT/PLAN: This is a 80 y.o. adult with multiple medical comorbidities that vascular surgery has been consulted for a femoral-femoral bypass.  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  He has had attempts at both retrograde crossing of the CTO from transfemoral access as well as antegrade from brachial approach by Dr. Fletcher Anon that were unsuccessful.  I agree that the next best option would be a femoral-femoral bypass.  We discussed this would be taking inflow from the right leg to provide improved blood flow to the left leg.  I discussed steps of the surgery with the patient and his wife.  We will get him scheduled for next Friday, 07/21/2021.  He will need to hold his Eliquis prior to surgery.  Discussed with Dr. Fletcher Anon of cardiology.  Marty Heck, MD [facility] Office: [place]

## 2021-07-19 NOTE — Interval H&P Note (Signed)
  The patient was found to have occluded left common iliac artery which could not be crossed retrograde from the left common femoral artery and there was not enough support from the contralateral femoral artery.  Thus, he is here today for attempted revascularization of the left common iliac artery via left brachial artery access and left common femoral artery access.  This was discussed extensively with the patient and his wife.  If the procedure is not successful, the patient will require a right to left femoral-femoral bypass given nonhealing wound on the left foot.   History and Physical Interval Note:  07/19/2021 10:13 AM  Paul [name]  has presented today for surgery, with the diagnosis of chest pain.  The various methods of treatment have been discussed with the patient and family. After consideration of risks, benefits and other options for treatment, the patient has consented to  Procedure(s): LEFT COMMON ILIAC PTA (Left) as a surgical intervention.  The patient's history has been reviewed, patient examined, no change in status, stable for surgery.  I have reviewed the patient's chart and labs.  Questions were answered to the patient's satisfaction.     Kathlyn Sacramento

## 2021-07-19 NOTE — Addendum Note (Signed)
Addended by: Nicholas Lose on: 07/19/2021 04:24 PM   Modules accepted: Orders

## 2021-07-19 NOTE — Progress Notes (Signed)
Observed patients left brachial area was swelling and bruising around puncture site from procedure. Writer paged Dr. Fletcher Anon to assess patients left arm. After further assessment, MD took arm board stabilizer off patients arm. Swelling started to subside. MD instructed patient to keep arm straight at home for 24 hours. Ambulated patient and observed no swelling or bleeding to left femoral site.Patient getting dressed for discharge.

## 2021-07-20 ENCOUNTER — Emergency Department (HOSPITAL_BASED_OUTPATIENT_CLINIC_OR_DEPARTMENT_OTHER)
Admission: EM | Admit: 2021-07-20 | Discharge: 2021-07-20 | Disposition: A | Discharge status: Home / Self Care | Payer: Medicare Other | Source: Home / Self Care | Attending: Emergency Medicine | Admitting: Emergency Medicine

## 2021-07-20 ENCOUNTER — Other Ambulatory Visit: Payer: Self-pay

## 2021-07-20 ENCOUNTER — Telehealth: Payer: Self-pay | Admitting: Physician Assistant

## 2021-07-20 ENCOUNTER — Emergency Department (HOSPITAL_COMMUNITY)
Admission: EM | Admit: 2021-07-20 | Discharge: 2021-07-20 | Disposition: A | Discharge status: Home / Self Care | Payer: Medicare Other | Attending: Emergency Medicine | Admitting: Emergency Medicine

## 2021-07-20 ENCOUNTER — Ambulatory Visit: Payer: Medicare Other | Admitting: Podiatry

## 2021-07-20 ENCOUNTER — Encounter (HOSPITAL_COMMUNITY): Payer: Self-pay | Admitting: Cardiovascular Disease

## 2021-07-20 DIAGNOSIS — Z7901 Long term (current) use of anticoagulants: Secondary | ICD-10-CM | POA: Insufficient documentation

## 2021-07-20 DIAGNOSIS — S71102A Unspecified open wound, left thigh, initial encounter: Secondary | ICD-10-CM | POA: Insufficient documentation

## 2021-07-20 DIAGNOSIS — E119 Type 2 diabetes mellitus without complications: Secondary | ICD-10-CM | POA: Insufficient documentation

## 2021-07-20 DIAGNOSIS — Z79899 Other long term (current) drug therapy: Secondary | ICD-10-CM | POA: Insufficient documentation

## 2021-07-20 DIAGNOSIS — F17210 Nicotine dependence, cigarettes, uncomplicated: Secondary | ICD-10-CM | POA: Insufficient documentation

## 2021-07-20 DIAGNOSIS — J449 Chronic obstructive pulmonary disease, unspecified: Secondary | ICD-10-CM | POA: Insufficient documentation

## 2021-07-20 DIAGNOSIS — I10 Essential (primary) hypertension: Secondary | ICD-10-CM | POA: Insufficient documentation

## 2021-07-20 DIAGNOSIS — X58XXXA Exposure to other specified factors, initial encounter: Secondary | ICD-10-CM | POA: Insufficient documentation

## 2021-07-20 DIAGNOSIS — I2510 Atherosclerotic heart disease of native coronary artery without angina pectoris: Secondary | ICD-10-CM | POA: Insufficient documentation

## 2021-07-20 DIAGNOSIS — M7989 Other specified soft tissue disorders: Secondary | ICD-10-CM | POA: Diagnosis present

## 2021-07-20 DIAGNOSIS — I480 Paroxysmal atrial fibrillation: Secondary | ICD-10-CM | POA: Insufficient documentation

## 2021-07-20 DIAGNOSIS — Z7984 Long term (current) use of oral hypoglycemic drugs: Secondary | ICD-10-CM | POA: Insufficient documentation

## 2021-07-20 DIAGNOSIS — R233 Spontaneous ecchymoses: Secondary | ICD-10-CM | POA: Diagnosis not present

## 2021-07-20 DIAGNOSIS — Z85118 Personal history of other malignant neoplasm of bronchus and lung: Secondary | ICD-10-CM | POA: Insufficient documentation

## 2021-07-20 DIAGNOSIS — S41132A Puncture wound without foreign body of left upper arm, initial encounter: Secondary | ICD-10-CM | POA: Insufficient documentation

## 2021-07-20 DIAGNOSIS — I25110 Atherosclerotic heart disease of native coronary artery with unstable angina pectoris: Secondary | ICD-10-CM | POA: Diagnosis not present

## 2021-07-20 DIAGNOSIS — M79602 Pain in left arm: Secondary | ICD-10-CM | POA: Diagnosis not present

## 2021-07-20 DIAGNOSIS — R58 Hemorrhage, not elsewhere classified: Secondary | ICD-10-CM

## 2021-07-20 DIAGNOSIS — Z5321 Procedure and treatment not carried out due to patient leaving prior to being seen by health care provider: Secondary | ICD-10-CM | POA: Insufficient documentation

## 2021-07-20 LAB — CBC WITH DIFFERENTIAL/PLATELET
Abs Immature Granulocytes: 0.07 10*3/uL (ref 0.00–0.07)
Basophils Absolute: 0 10*3/uL (ref 0.0–0.1)
Basophils Relative: 0 %
Eosinophils Absolute: 0.1 10*3/uL (ref 0.0–0.5)
Eosinophils Relative: 1 %
HCT: 38.8 % — ABNORMAL LOW (ref 39.0–52.0)
Hemoglobin: 12.2 g/dL — ABNORMAL LOW (ref 13.0–17.0)
Immature Granulocytes: 1 %
Lymphocytes Relative: 12 %
Lymphs Abs: 1.1 10*3/uL (ref 0.7–4.0)
MCH: 27.5 pg (ref 26.0–34.0)
MCHC: 31.4 g/dL (ref 30.0–36.0)
MCV: 87.6 fL (ref 80.0–100.0)
Monocytes Absolute: 0.8 10*3/uL (ref 0.1–1.0)
Monocytes Relative: 9 %
Neutro Abs: 7.3 10*3/uL (ref 1.7–7.7)
Neutrophils Relative %: 77 %
Platelets: 292 10*3/uL (ref 150–400)
RBC: 4.43 MIL/uL (ref 4.22–5.81)
RDW: 14.7 % (ref 11.5–15.5)
WBC: 9.5 10*3/uL (ref 4.0–10.5)
nRBC: 0 % (ref 0.0–0.2)

## 2021-07-20 LAB — COMPREHENSIVE METABOLIC PANEL
ALT: 15 U/L (ref 0–44)
AST: 16 U/L (ref 15–41)
Albumin: 3.2 g/dL — ABNORMAL LOW (ref 3.5–5.0)
Alkaline Phosphatase: 47 U/L (ref 38–126)
Anion gap: 7 (ref 5–15)
BUN: 12 mg/dL (ref 8–23)
CO2: 25 mmol/L (ref 22–32)
Calcium: 8.9 mg/dL (ref 8.9–10.3)
Chloride: 107 mmol/L (ref 98–111)
Creatinine, Ser: 0.74 mg/dL (ref 0.61–1.24)
GFR, Estimated: >60 mL/min (ref >60)
Glucose, Bld: 252 mg/dL — ABNORMAL HIGH (ref 70–99)
Potassium: 4.1 mmol/L (ref 3.5–5.1)
Sodium: 139 mmol/L (ref 135–145)
Total Bilirubin: 0.6 mg/dL (ref 0.3–1.2)
Total Protein: 6.2 g/dL — ABNORMAL LOW (ref 6.5–8.1)

## 2021-07-20 LAB — PROTIME-INR
INR: 1 (ref 0.8–1.2)
Prothrombin Time: 13.6 seconds (ref 11.4–15.2)

## 2021-07-20 NOTE — ED Provider Notes (Signed)
[name] Provider Note   CSN: [id number] Arrival date & time: 07/20/21  1540     History Chief Complaint  Patient presents with   Arm Injury    Paul [name] is a 80 y.o. adult.  Patient is a 80 year old male who presents with bruising to his left arm.  Yesterday he had a left iliac angiography performed.  He had punctures in his left brachial artery as well as his left femoral artery.  He states that he had a small area of bruising noted yesterday prior to leaving the hospital.  However during the night and this morning it has progressed.  The wife states it has not progressed any more of the last several hours.  He denies any pain.  No numbness or weakness in the arm.  He is on Eliquis but has not taken it since Sunday night.  He did not take it for the 2 days prior to the procedure done yesterday.  He has not taken it today.      Past Medical History:  Diagnosis Date   Asthma    Atrial fibrillation ([place])    CAD (coronary artery disease)    Prior PTCA   COPD (chronic obstructive pulmonary disease) (HCC)    Diabetes mellitus    GERD (gastroesophageal reflux disease)    Hard of hearing    Hyperlipidemia    Hypertension    lung ca dx'd 01/2006   Nephrolithiasis    Neuropathy    Osteopenia    Pulmonary embolus (HCC)    Tibia/fibula fracture 07/2016   Left   Tremor, essential     Patient Active Problem List   Diagnosis Date Noted   Memory loss 04/04/2021   Gait abnormality 08/04/2020   Paresthesia 08/04/2020   Weakness 08/04/2020   Chronic maxillary sinusitis 08/07/2018   Enchondroma 07/29/2018   Rhinitis, chronic 05/02/2017   Peripheral edema 05/01/2017   Ankle fracture, left 08/24/2016   Closed fracture of proximal end of left tibia 08/22/2016   Tibial fracture 08/09/2016   Tibia fracture 08/09/2016   COPD (chronic obstructive pulmonary disease) ([place])    Chronic respiratory failure with hypoxia ([place]) 09/11/2015   Morbid  obesity due to excess calories ([place]) Complicated by dm, hbp/ hyperlipidemia 09/11/2015   Chronic respiratory failure ([place]) 03/07/2015   CAFL (chronic airflow limitation) ([place]) 10/27/2014   Diabetes mellitus, type 2 ([place]) 10/27/2014   Diabetes mellitus type 2, uncontrolled ([place]) 10/27/2014   Acid reflux 10/27/2014   Benign essential HTN 10/27/2014   History of primary bronchial cancer 10/27/2014   Extreme obesity 10/27/2014   OP (osteoporosis) 10/27/2014   Peripheral neuropathy ([place]) 10/27/2014   Hypercholesterolemia without hypertriglyceridemia 10/27/2014   Compulsive tobacco user syndrome 10/27/2014   Cough 06/21/2014   Bronchitis 06/10/2014   CAD (coronary artery disease) 04/08/2012   Hyperlipidemia 04/08/2012   Hypertension 04/08/2012   Cigarette smoker 04/08/2012   AF (paroxysmal atrial fibrillation) ([place]) 03/27/2012   Exercise hypoxemia 03/25/2012   COPD GOLD II/ still smoking  03/25/2012   DM (diabetes mellitus), type 2, uncontrolled with complications ([place]) 95/18/8416   Malignant neoplasm of bronchus and lung, unspecified site 10/11/2011    Past Surgical History:  Procedure Laterality Date   ABDOMINAL AORTOGRAM W/LOWER EXTREMITY N/A 07/12/2021   Procedure: ABDOMINAL AORTOGRAM W/LOWER EXTREMITY;  Surgeon: Wellington Hampshire, MD;  Location: [facility];  Service: Cardiovascular;  Laterality: N/A;   Angioplasty  Approx [place]  CHOLECYSTECTOMY     enchondroma Right 2016   Humerus   FIXATION KYPHOPLASTY     LUNG CANCER SURGERY  01/2006   Small excision of left lung tissue; mesh with radioactive seeds (per pt report)   ORIF TIBIA FRACTURE Left 08/22/2016   Procedure: OPEN REDUCTION INTERNAL FIXATION (ORIF) TIBIA FRACTURE;  Surgeon: Nicholes Stairs, MD;  Location: [facility];  Service: Orthopedics;  Laterality: Left;   PELVIC ANGIOGRAPHY Left 07/19/2021   Procedure: LEFT COMMON ILIAC PTA;  Surgeon: Wellington Hampshire, MD;   Location: [facility];  Service: Cardiovascular;  Laterality: Left;   PERIPHERAL VASCULAR BALLOON ANGIOPLASTY  07/12/2021   Procedure: PERIPHERAL VASCULAR BALLOON ANGIOPLASTY;  Surgeon: Wellington Hampshire, MD;  Location: [facility];  Service: Cardiovascular;;  aborted PTA of left common iliac   ROTATOR CUFF TEAR  2016   Tibia/Fibula Repair Left 07/2016   TONSILLECTOMY       OB History   No obstetric history on file.     Family History  Problem Relation Age of Onset   Heart disease Mother    Tremor Mother    Dementia Mother    Heart Problems Father     Social History   Tobacco Use   Smoking status: Every Day    Packs/day: 0.50    Years: 59.00    Pack years: 29.50    Types: Cigarettes   Smokeless tobacco: Never  Substance Use Topics   Alcohol use: Not Currently    Alcohol/week: 1.0 standard drink    Types: 1 Glasses of wine per week   Drug use: Never    Home Medications Prior to Admission medications   Medication Sig Start Date End Date Taking? Authorizing Provider  acetaminophen (TYLENOL) 500 MG tablet Take 1,000 mg by mouth every 8 (eight) hours as needed for moderate pain.    [provider]  albuterol (VENTOLIN HFA) 108 (90 Base) MCG/ACT inhaler Inhale 2 puffs into the lungs every 2 (two) hours as needed for wheezing or shortness of breath.     [provider]  atorvastatin (LIPITOR) 40 MG tablet TAKE 1 TABLET BY MOUTH AT  BEDTIME Patient taking differently: Take 40 mg by mouth daily. 10/06/18   Lelon Perla, MD  calcium carbonate (OS-CAL) 600 MG TABS tablet Take 600 mg by mouth every evening.    [provider]  Cholecalciferol (VITAMIN D3) 20 MCG (800 UNIT) TABS Take 800 Units by mouth daily.    [provider]  ELIQUIS 5 MG TABS tablet TAKE 1 TABLET BY MOUTH  TWICE DAILY Patient taking differently: Take 5 mg by mouth 2 (two) times daily. 01/09/21   Deberah Pelton, NP  empagliflozin (JARDIANCE) 25 MG TABS  tablet Take 25 mg by mouth daily.    [provider]  fexofenadine (ALLEGRA) 180 MG tablet Take 180 mg by mouth daily.    [provider]  fluticasone (FLONASE) 50 MCG/ACT nasal spray Place 1 spray into the nose 2 (two) times daily.    [provider]  furosemide (LASIX) 20 MG tablet TAKE 1 TABLET BY MOUTH  DAILY Patient taking differently: Take 20 mg by mouth daily. 12/05/18   Tanda Rockers, MD  gabapentin (NEURONTIN) 300 MG capsule Take 1,200 mg by mouth at bedtime. 4 at bedtime    [provider]  guaiFENesin (MUCINEX) 600 MG 12 hr tablet Take 1,200 mg by mouth daily with breakfast.     [provider]  losartan (COZAAR) 50 MG tablet Take 1 tablet (50 mg total) by mouth daily. Patient taking differently: Take 50 mg by mouth daily with breakfast. 10/21/13   Tanda Rockers, MD  metFORMIN (GLUCOPHAGE) 1000 MG tablet Take 1,000 mg by mouth 2 (two) times daily with a meal.    [provider]  metoCLOPramide (REGLAN) 5 MG tablet Take 5 mg by mouth 3 (three) times daily before meals. 04/01/21   [provider]  Multiple Vitamins-Minerals (CENTRUM SILVER ADULT 50+) TABS Take 1 tablet by mouth daily with breakfast.    [provider]  omeprazole (PRILOSEC OTC) 20 MG tablet Take 20 mg by mouth daily as needed (heartburn).    [provider]  OXYGEN Inhale 3-4 L into the lungs continuous. 4L of oxygen at night and 3L of oxygen when walking long distances    [provider]  [facility] injection  01/03/21   [provider]  vitamin C (ASCORBIC ACID) 250 MG tablet Take 250 mg by mouth daily.    [provider]    Allergies    Codeine and Phenobarbital  Review of Systems   Review of Systems  Constitutional:  Negative for chills, diaphoresis, fatigue and fever.  Eyes: Negative.   Respiratory:  Negative for shortness of breath.   Cardiovascular:  Negative for chest pain and leg swelling.   Gastrointestinal:  Negative for abdominal pain, blood in stool, diarrhea, nausea and vomiting.  Genitourinary: Negative.   Musculoskeletal:  Negative for arthralgias.  Skin:  Positive for color change and wound. Negative for rash.  Neurological:  Negative for dizziness, weakness, numbness and headaches.  All other systems reviewed and are negative.  Physical Exam Updated Vital Signs BP (!) 117/98 (BP Location: Right Arm)   Pulse 85   Temp 98.6 F (37 C) (Oral)   Resp 18   Ht _0  (1.702 m)   Wt 88.9 kg   SpO2 99%   BMI 30.70 kg/m   Physical Exam Constitutional:      Appearance: He is well-developed.  HENT:     Head: Normocephalic and atraumatic.  Eyes:     Pupils: Pupils are equal, round, and reactive to light.  Cardiovascular:     Rate and Rhythm: Normal rate and regular rhythm.     Heart sounds: Normal heart sounds.  Pulmonary:     Effort: Pulmonary effort is normal. No respiratory distress.     Breath sounds: Normal breath sounds. No wheezing or rales.  Chest:     Chest wall: No tenderness.  Abdominal:     General: Bowel sounds are normal.     Palpations: Abdomen is soft.     Tenderness: There is no abdominal tenderness. There is no guarding or rebound.  Musculoskeletal:        General: Normal range of motion.     Cervical back: Normal range of motion and neck supple.     Comments: Patient has a puncture wound to his left upper arm.  There is a large area of ecchymosis.  There is no palpable hematoma.  No fluctuance.  No ongoing bleeding.  Radial pulses intact.  The puncture wound site to his left groin is well-appearing.  There is minimal ecchymosis around the area.  No bleeding.  No swelling.  Lymphadenopathy:     Cervical: No cervical adenopathy.  Skin:    General: Skin is warm and dry.     Findings: No rash.  Neurological:     Mental Status:  He is alert and oriented to person, place, and time.     ED Results / Procedures / Treatments   Labs (all labs  ordered are listed, but only abnormal results are displayed) Labs Reviewed - No data to display  EKG None  Radiology PERIPHERAL VASCULAR CATHETERIZATION  Result Date: 07/20/2021 Unsuccessful angioplasty of the left common iliac artery via the antegrade left brachial artery access and left common femoral artery retrograde access.  I was able to cross the occlusion antegrade with an 018 CTO wire but was not able to track any crossing catheters to cross the occlusion.  Attempted snare of the wire via the left common femoral artery was not successful due to significant tortuosity and ultimately the sheath in the left common femoral artery inadvertently came out and thus the procedure had to be aborted and manual pressure was held. Recommendations: Recommend evaluation for right to left femoral-femoral bypass given nonhealing ulceration on the left foot. Eliquis can be resumed tomorrow as long as no bleeding issues.    Procedures Procedures   Medications Ordered in ED Medications - No data to display  ED Course  I have reviewed the triage vital signs and the nursing notes.  Pertinent labs & imaging results that were available during my care of the patient were reviewed by me and considered in my medical decision making (see chart for details).    MDM Rules/Calculators/A&P                           Patient is a 80 year old male who presents with an area of ecchymosis to his arm after an arterial puncture from a procedure yesterday.  He has no expanding hematomas.  No significant swelling to the area.  Distal pulses are intact.  I spoke with Dr. Radford Pax with cardiology.  They will arrange for patient to be seen tomorrow in the cardiology office for recheck.  He was advised to hold his Eliquis tomorrow morning until he sees the cardiologist.  He was advised to keep his arm straight.  Return precautions were given. Final Clinical Impression(s) / ED Diagnoses Final diagnoses:  Ecchymosis    Rx /  DC Orders ED Discharge Orders     None        Malvin Johns, MD 07/20/21 2019

## 2021-07-20 NOTE — ED Provider Notes (Signed)
Emergency Medicine Provider Triage Evaluation Note  Paul [name] , a 80 y.o. adult  was evaluated in triage.  Pt complains of left arm pain..  Seen yesterday by cardiolgoy, had L common iliac PTA, unsuccessful.  Has been having increased bleeding from the left arm and it is painful.  Scheduled for femoral femoral bypass next week. On elliquis, but his last dose was on Monday 3 days ago.  Review of Systems  Positive: Left arm pain, bleeding Negative: No chest pain, shortness of breath, back pain, numbness or tingling in the left arm  Physical Exam  BP 133/73 (BP Location: Right Arm)   Pulse 95   Temp 98.7 F (37.1 C) (Oral)   Resp 14   Ht _0  (1.702 m)   Wt 88.9 kg   SpO2 95%   BMI 30.70 kg/m  Gen:   Awake, no distress   Resp:  Normal effort  MSK:   Moves extremities without difficulty  Other:  Radial pulse 2+, cap refill less than 2.  Sensation intact to the left extremity.  Patient does have contusion surrounding where access was obtained yesterday, but is not actively bleeding  Medical Decision Making  Medically screening exam initiated at 9:31 AM.  Appropriate orders placed.  Paul [name] was informed that the remainder of the evaluation will be completed by another provider, this initial triage assessment does not replace that evaluation, and the importance of remaining in the ED until their evaluation is complete.  We will check coagulation factors and blood work.  We will hold off on imaging at this time.   Sherrill Raring, PA-C 07/20/21 1115    Tegeler, Gwenyth Allegra, MD 07/20/21 [phone]

## 2021-07-20 NOTE — Telephone Encounter (Signed)
Dr. Radford Pax received page from Dr. Tamera Punt from [facility].  Patient with left arm ecchymosis.  Dr. Radford Pax advised to continue to hold anticoagulation.  Per Dr. Radford Pax patient needs to be seen in any clinic tomorrow for site evaluation.  Will send message to staff.

## 2021-07-20 NOTE — ED Triage Notes (Signed)
C/O left arm, swelling. Stated he had a procedure yesterday and incision site on his left arm is getting bigger. + wound dressing in tact.

## 2021-07-20 NOTE — Discharge Instructions (Addendum)
Keep your arm straight.  Follow-up with a cardiologist tomorrow for recheck.  If you do not hear from the office by 9:00am, call Dr. Jacalyn Lefevre office.  Return to the emergency room if you have any worsening symptoms.

## 2021-07-20 NOTE — ED Triage Notes (Signed)
Pt arrives pov with c/o vascular procedure using proximal LUE. Hematome present, soft, but wife endorses increase in size. Pt endorses pain. Pt reports eliquis, has not taken since Sunday.

## 2021-07-21 ENCOUNTER — Ambulatory Visit (INDEPENDENT_AMBULATORY_CARE_PROVIDER_SITE_OTHER): Payer: Medicare Other | Admitting: Cardiovascular Disease

## 2021-07-21 VITALS — BP 130/72 | HR 88 | Ht 67.5 in | Wt 193.0 lb

## 2021-07-21 DIAGNOSIS — I4821 Permanent atrial fibrillation: Secondary | ICD-10-CM | POA: Diagnosis not present

## 2021-07-21 DIAGNOSIS — T148XXA Other injury of unspecified body region, initial encounter: Secondary | ICD-10-CM

## 2021-07-21 DIAGNOSIS — I739 Peripheral vascular disease, unspecified: Secondary | ICD-10-CM

## 2021-07-21 DIAGNOSIS — I2510 Atherosclerotic heart disease of native coronary artery without angina pectoris: Secondary | ICD-10-CM

## 2021-07-21 NOTE — Progress Notes (Signed)
Cardiology Office Note:    Date:  07/21/2021   ID:  [name], DOB 07/16/1941, MRN [id number]  PCP:  Jonathon Jordan, MD   [facility] Providers Cardiologist:  Kirk Ruths, MD Hematoma left arm    Referring MD: Jonathon Jordan, MD   Chief Complaint  Patient presents with   Follow-up  Hematoma and catheterization site  History of Present Illness:    Paul [name] is a 80 y.o. adult with a hx of extensive atherosclerotic disease involving the coronary and lower extremity arterial circulation, permanent atrial fibrillation,  hyperlipidemia, hypertension, type 2 diabetes mellitus followed with complaints of a large hematoma at the site of left brachial artery access site following a peripheral angiogram on Wednesday, 07/19/2021.  He was seen in the [facility] emergency room yesterday evening.  He does not have any pain, paresthesias or cold sensation in his hand and has normal movement of his hand and fingers.  He has a large ecchymosis that extends from the mid portion of the upper arm to the upper third of the forearm, centered on the puncture site in the antecubital area.  The ecchymosis is flat and is not tender.  It has a deep burgundy discoloration, but there is no excessive warmth and no drainage.  He has excellent capillary refill in his fingers.  Initially the angiogram was attempted via the right femoral approach, but due to vessel occlusion, was switched to left brachial.  He had an unsuccessful attempt at angioplasty of the left common iliac artery via the brachial artery approach and left common femoral retrograde access.  Although the occlusion was crossed in an antegrade fashion, the guidewire could not be snared to due to tortuosity of the vessel.  He was supposed to resume anticoagulation later today for atrial fibrillation, but is also scheduled to again interrupt his oral anticoagulant on Sunday in anticipation of right to left left femorofemoral bypass with Dr.  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    He does not have other cardiovascular complaints at this time and specifically denies dyspnea, angina, palpitations, dizziness, syncope or lower extremity edema.  He has no problems at the left common femoral artery access site.  Past Medical History:  Diagnosis Date   Asthma    Atrial fibrillation (HCC)    CAD (coronary artery disease)    Prior PTCA   COPD (chronic obstructive pulmonary disease) (HCC)    Diabetes mellitus    GERD (gastroesophageal reflux disease)    Hard of hearing    Hyperlipidemia    Hypertension    lung ca dx'd 01/2006   Nephrolithiasis    Neuropathy    Osteopenia    Pulmonary embolus (HCC)    Tibia/fibula fracture 07/2016   Left   Tremor, essential     Past Surgical History:  Procedure Laterality Date   ABDOMINAL AORTOGRAM W/LOWER EXTREMITY N/A 07/12/2021   Procedure: ABDOMINAL AORTOGRAM W/LOWER EXTREMITY;  Surgeon: Wellington Hampshire, MD;  Location: [place] [facility];  Service: Cardiovascular;  Laterality: N/A;   Angioplasty  Approx [place]     enchondroma Right 2016   Humerus   FIXATION KYPHOPLASTY     LUNG CANCER SURGERY  01/2006   Small excision of left lung tissue; mesh with radioactive seeds (per pt report)   ORIF TIBIA FRACTURE Left 08/22/2016   Procedure: OPEN REDUCTION INTERNAL FIXATION (ORIF)  TIBIA FRACTURE;  Surgeon: Nicholes Stairs, MD;  Location: [facility];  Service: Orthopedics;  Laterality: Left;   PELVIC ANGIOGRAPHY Left 07/19/2021   Procedure: LEFT COMMON ILIAC PTA;  Surgeon: Wellington Hampshire, MD;  Location: [place] [facility];  Service: Cardiovascular;  Laterality: Left;   PERIPHERAL VASCULAR BALLOON ANGIOPLASTY  07/12/2021   Procedure: PERIPHERAL VASCULAR BALLOON ANGIOPLASTY;  Surgeon: Wellington Hampshire, MD;  Location: [place] [facility];  Service: Cardiovascular;;   aborted PTA of left common iliac   ROTATOR CUFF TEAR  2016   Tibia/Fibula Repair Left 07/2016   TONSILLECTOMY      Current Medications: Current Meds  Medication Sig   acetaminophen (TYLENOL) 500 MG tablet Take 1,000 mg by mouth every 8 (eight) hours as needed for moderate pain.   albuterol (VENTOLIN HFA) 108 (90 Base) MCG/ACT inhaler Inhale 2 puffs into the lungs every 2 (two) hours as needed for wheezing or shortness of breath.    atorvastatin (LIPITOR) 40 MG tablet TAKE 1 TABLET BY MOUTH AT  BEDTIME (Patient taking differently: Take 40 mg by mouth daily.)   calcium carbonate (OS-CAL) 600 MG TABS tablet Take 600 mg by mouth every evening.   Cholecalciferol (VITAMIN D3) 20 MCG (800 UNIT) TABS Take 800 Units by mouth daily.   ELIQUIS 5 MG TABS tablet TAKE 1 TABLET BY MOUTH  TWICE DAILY (Patient taking differently: Take 5 mg by mouth 2 (two) times daily.)   empagliflozin (JARDIANCE) 25 MG TABS tablet Take 25 mg by mouth daily.   fexofenadine (ALLEGRA) 180 MG tablet Take 180 mg by mouth daily.   fluticasone (FLONASE) 50 MCG/ACT nasal spray Place 1 spray into the nose 2 (two) times daily.   furosemide (LASIX) 20 MG tablet TAKE 1 TABLET BY MOUTH  DAILY (Patient taking differently: Take 20 mg by mouth daily.)   gabapentin (NEURONTIN) 300 MG capsule Take 1,200 mg by mouth at bedtime. 4 at bedtime   guaiFENesin (MUCINEX) 600 MG 12 hr tablet Take 1,200 mg by mouth daily with breakfast.    losartan (COZAAR) 50 MG tablet Take 1 tablet (50 mg total) by mouth daily. (Patient taking differently: Take 50 mg by mouth daily with breakfast.)   metFORMIN (GLUCOPHAGE) 1000 MG tablet Take 1,000 mg by mouth 2 (two) times daily with a meal.   metoCLOPramide (REGLAN) 5 MG tablet Take 5 mg by mouth 3 (three) times daily before meals.   Multiple Vitamins-Minerals (CENTRUM SILVER ADULT 50+) TABS Take 1 tablet by mouth daily with breakfast.   omeprazole (PRILOSEC OTC) 20 MG tablet Take 20 mg by mouth daily as needed  (heartburn).   OXYGEN Inhale 3-4 L into the lungs continuous. 4L of oxygen at night and 3L of oxygen when walking long distances   SHINGRIX injection    vitamin C (ASCORBIC ACID) 250 MG tablet Take 250 mg by mouth daily.     Allergies:   Codeine and Phenobarbital   Social History   Socioeconomic History   Marital status: Married    Spouse name: Santiago Glad   Number of children: 2   Years of education: college   Highest education level: Not on file  Occupational History    Employer: RETIRED    Comment: Retired from [employer]  Tobacco Use   Smoking status: Every Day    Packs/day: 0.50    Years: 59.00    Pack years: 29.50    Types: Cigarettes   Smokeless tobacco: Never  Substance and Sexual Activity  Alcohol use: Not Currently    Alcohol/week: 1.0 standard drink    Types: 1 Glasses of wine per week   Drug use: Never   Sexual activity: Never  Other Topics Concern   Not on file  Social History Narrative   Patient lives at home with his wife Santiago Glad).   Retired.   Education- College    Right handed.   Caffeine- three cups of coffee daily.   Social Determinants of Health   Financial Resource Strain: Not on file  Food Insecurity: Not on file  Transportation Needs: Not on file  Physical Activity: Not on file  Stress: Not on file  Social Connections: Not on file     Family History: The patient's family history includes Dementia in his mother; Heart Problems in his father; Heart disease in his mother; Tremor in his mother.  ROS:   Please see the history of present illness.     All other systems reviewed and are negative.  EKGs/Labs/Other Studies Reviewed:    The following studies were reviewed today: Peripheral angiogram 07/19/2021  EKG:  EKG is no ordered today.   Recent Labs: 07/20/2021: ALT 15; BUN 12; Creatinine, Ser 0.74; Hemoglobin 12.2; Platelets 292; Potassium 4.1; Sodium 139  Recent Lipid Panel    Component Value Date/Time   CHOL 109 07/08/2020 0826   TRIG  46 07/08/2020 0826   HDL 64 07/08/2020 0826   CHOLHDL 1.7 07/08/2020 0826   CHOLHDL 1.8 03/04/2017 0829   VLDL 13 03/04/2017 0829   LDLCALC 33 07/08/2020 0826     Risk Assessment/Calculations:    CHA2DS2-VASc Score = 5   This indicates a 7.2% annual risk of stroke. The patient's score is based upon: CHF History: 0 HTN History: 1 Diabetes History: 1 Stroke History: 0 Vascular Disease History: 1 Age Score: 2 Gender Score: 0         Physical Exam:    VS:  BP 130/72 (BP Location: Right Arm, Patient Position: Sitting, Cuff Size: Normal)   Pulse 88   Ht 5' 7.5" (1.715 m)   Wt 193 lb (87.5 kg)   BMI 29.78 kg/m     Wt Readings from Last 3 Encounters:  07/21/21 193 lb (87.5 kg)  07/20/21 196 lb (88.9 kg)  07/20/21 196 lb (88.9 kg)     GEN:  Well nourished, well developed in no acute distress HEENT: Normal NECK: No JVD; No carotid bruits LYMPHATICS: No lymphadenopathy CARDIAC: Irregular, widely split S2  no murmurs, rubs, gallops RESPIRATORY:  Clear to auscultation without rales, wheezing or rhonchi  ABDOMEN: Soft, non-tender, non-distended MUSCULOSKELETAL:  No edema; No deformity .  Large ecchymosis extending from the mid left forearm to the upper third of the left forearm, soft, deep burgundy in color. SKIN: Warm and dry; Large ecchymosis extending from the mid left forearm to the upper third of the left forearm, soft, deep burgundy in color.  There is no bruit overlying the brachial artery.  There is no pulsatile mass to suggest pseudoaneurysm.  No palpable hematoma, ecchymosis is flat.  Dressing on ulcer on left great toe NEUROLOGIC:  Alert and oriented x 3 PSYCHIATRIC:  Normal affect   ASSESSMENT:    No diagnosis found. PLAN:    In order of problems listed above:  Left upper extremity ecchymosis: There is no evidence of ongoing bleeding or palpable hematoma.  The blood is simply spreading out through the soft tissues.  No suspicion for pseudoaneurysm at this  time.  Since he is  due to stop his Eliquis again and only 48 hours, I suggested staying off of it completely until his upcoming peripheral vascular surgery. 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  Plan for femorofemoral bypass next week with Dr. Carlis Abbott. AFib: On chronic oral anticoagulation.  I think it is reasonable to have him stay off the anticoagulant between now and his surgery next week.  The risk of the embolic events during a short period of time is acceptably low, and this will allow additional time for healing of the brachial artery puncture site.        Medication Adjustments/Labs and Tests Ordered: Current medicines are reviewed at length with the patient today.  Concerns regarding medicines are outlined above.  No orders of the defined types were placed in this encounter.  No orders of the defined types were placed in this encounter.   Patient Instructions  Medication Instructions:  No changes *If you need a refill on your cardiac medications before your next appointment, please call your pharmacy*   Lab Work: None ordered If you have labs (blood work) drawn today and your tests are completely normal, you will receive your results only by: [place] (if you have MyChart) OR A paper copy in the mail If you have any lab test that is abnormal or we need to change your treatment, we will call you to review the results.   Testing/Procedures: None ordered   Follow-Up: At [facility], you and your health needs are our priority.  As part of our continuing mission to provide you with exceptional heart care, we have created designated Provider Care Teams.  These Care Teams include your primary Cardiologist (physician) and Advanced Practice Providers (APPs -  Physician Assistants and Nurse Practitioners) who all work together to provide you with the care you need, when you need  it.  We recommend signing up for the patient portal called "MyChart".  Sign up information is provided on this After Visit Summary.  MyChart is used to connect with patients for Virtual Visits (Telemedicine).  Patients are able to view lab/test results, encounter notes, upcoming appointments, etc.  Non-urgent messages can be sent to your provider as well.   To learn more about what you can do with MyChart, go to [email or web address].    Your next appointment:   3 month(s)  The format for your next appointment:   In Person  Provider:   Kathlyn Sacramento, MD    Signed, Sanda Klein, MD  07/21/2021 1:20 PM    [place]

## 2021-07-21 NOTE — Telephone Encounter (Signed)
Spoke with the patient. He has been added on to see Dr. Sallyanne Kuster at 1 pm today

## 2021-07-21 NOTE — Patient Instructions (Signed)
Medication Instructions:  No changes *If you need a refill on your cardiac medications before your next appointment, please call your pharmacy*   Lab Work: None ordered If you have labs (blood work) drawn today and your tests are completely normal, you will receive your results only by: [place] (if you have MyChart) OR A paper copy in the mail If you have any lab test that is abnormal or we need to change your treatment, we will call you to review the results.   Testing/Procedures: None ordered   Follow-Up: At [facility], you and your health needs are our priority.  As part of our continuing mission to provide you with exceptional heart care, we have created designated Provider Care Teams.  These Care Teams include your primary Cardiologist (physician) and Advanced Practice Providers (APPs -  Physician Assistants and Nurse Practitioners) who all work together to provide you with the care you need, when you need it.  We recommend signing up for the patient portal called "MyChart".  Sign up information is provided on this After Visit Summary.  MyChart is used to connect with patients for Virtual Visits (Telemedicine).  Patients are able to view lab/test results, encounter notes, upcoming appointments, etc.  Non-urgent messages can be sent to your provider as well.   To learn more about what you can do with MyChart, go to [email or web address].    Your next appointment:   3 month(s)  The format for your next appointment:   In Person  Provider:   Kathlyn Sacramento, MD

## 2021-07-24 ENCOUNTER — Other Ambulatory Visit: Payer: Self-pay | Admitting: Vascular Surgery

## 2021-07-24 NOTE — Pre-Procedure Instructions (Signed)
Surgical Instructions    Your procedure is scheduled on Friday, September 16th.  Report to [facility] Main Entrance "A" at 5:30 A.M., then check in with the Admitting office.  Call this number if you have problems the morning of surgery:  [phone]   If you have any questions prior to your surgery date call [phone]: Open Monday-Friday 8am-4pm    Remember:  Do not eat or drink after midnight the night before your surgery   Take these medicines the morning of surgery with A SIP OF WATER  atorvastatin (LIPITOR)  fexofenadine (ALLEGRA)  fluticasone (FLONASE) guaiFENesin (MUCINEX)  metoCLOPramide (REGLAN)    Take these medications as needed: acetaminophen (TYLENOL)  Albuterol inhaler-please bring with you to the hospital.  omeprazole (PRILOSEC OTC)   Follow your surgeon's instructions on when to stop/ resume ELIQUIS.  If no instructions were given by your surgeon then you will need to call the office to get those instructions.    As of today, STOP taking any Aspirin (unless otherwise instructed by your surgeon) Aleve, Naproxen, Ibuprofen, Motrin, Advil, Goody's, BC's, all herbal medications, fish oil, and all vitamins.  WHAT DO I DO ABOUT MY DIABETES MEDICATION?   Do not take empagliflozin (JARDIANCE) on Thursday (9/15) or Friday (9/16).   Do not take metFORMIN (GLUCOPHAGE) the morning of surgery.    HOW TO MANAGE YOUR DIABETES BEFORE AND AFTER SURGERY  Why is it important to control my blood sugar before and after surgery? Improving blood sugar levels before and after surgery helps healing and can limit problems. A way of improving blood sugar control is eating a healthy diet by:  Eating less sugar and carbohydrates  Increasing activity/exercise  Talking with your doctor about reaching your blood sugar goals High blood sugars (greater than 180 mg/dL) can raise your risk of infections and slow your recovery, so you will need to focus on controlling your diabetes  during the weeks before surgery. Make sure that the doctor who takes care of your diabetes knows about your planned surgery including the date and location.  How do I manage my blood sugar before surgery? Check your blood sugar at least 4 times a day, starting 2 days before surgery, to make sure that the level is not too high or low.  Check your blood sugar the morning of your surgery when you wake up and every 2 hours until you get to the Short Stay unit.  If your blood sugar is less than 70 mg/dL, you will need to treat for low blood sugar: Do not take insulin. Treat a low blood sugar (less than 70 mg/dL) with  cup of clear juice (cranberry or apple), 4 glucose tablets, OR glucose gel. Recheck blood sugar in 15 minutes after treatment (to make sure it is greater than 70 mg/dL). If your blood sugar is not greater than 70 mg/dL on recheck, call [phone] for further instructions. Report your blood sugar to the short stay nurse when you get to Short Stay.  If you are admitted to the hospital after surgery: Your blood sugar will be checked by the staff and you will probably be given insulin after surgery (instead of oral diabetes medicines) to make sure you have good blood sugar levels. The goal for blood sugar control after surgery is 80-180 mg/dL.                      Do NOT Smoke (Tobacco/Vaping) or drink Alcohol 24 hours prior to your procedure.  If you use a CPAP at night, you may bring all equipment for your overnight stay.   Contacts, glasses, piercing's, hearing aid's, dentures or partials may not be worn into surgery, please bring cases for these belongings.    For patients admitted to the hospital, discharge time will be determined by your treatment team.   Patients discharged the day of surgery will not be allowed to drive home, and someone needs to stay with them for 24 hours.  ONLY 1 SUPPORT PERSON MAY BE PRESENT WHILE YOU ARE IN SURGERY. IF YOU ARE TO BE ADMITTED ONCE  YOU ARE IN YOUR ROOM YOU WILL BE ALLOWED TWO (2) VISITORS.  Minor children may have two parents present. Special consideration for safety and communication needs will be reviewed on a case by case basis.   Special instructions:   [facility]- Preparing For Surgery  Before surgery, you can play an important role. Because skin is not sterile, your skin needs to be as free of germs as possible. You can reduce the number of germs on your skin by washing with CHG (chlorahexidine gluconate) Soap before surgery.  CHG is an antiseptic cleaner which kills germs and bonds with the skin to continue killing germs even after washing.    Oral Hygiene is also important to reduce your risk of infection.  Remember - BRUSH YOUR TEETH THE MORNING OF SURGERY WITH YOUR REGULAR TOOTHPASTE  Please do not use if you have an allergy to CHG or antibacterial soaps. If your skin becomes reddened/irritated stop using the CHG.  Do not shave (including legs and underarms) for at least 48 hours prior to first CHG shower. It is OK to shave your face.  Please follow these instructions carefully.   Shower the NIGHT BEFORE SURGERY and the MORNING OF SURGERY  If you chose to wash your hair, wash your hair first as usual with your normal shampoo.  After you shampoo, rinse your hair and body thoroughly to remove the shampoo.  Use CHG Soap as you would any other liquid soap. You can apply CHG directly to the skin and wash gently with a scrungie or a clean washcloth.   Apply the CHG Soap to your body ONLY FROM THE NECK DOWN.  Do not use on open wounds or open sores. Avoid contact with your eyes, ears, mouth and genitals (private parts). Wash Face and genitals (private parts)  with your normal soap.   Wash thoroughly, paying special attention to the area where your surgery will be performed.  Thoroughly rinse your body with warm water from the neck down.  DO NOT shower/wash with your normal soap after using and rinsing off the  CHG Soap.  Pat yourself dry with a CLEAN TOWEL.  Wear CLEAN PAJAMAS to bed the night before surgery  Place CLEAN SHEETS on your bed the night before your surgery  DO NOT SLEEP WITH PETS.   Day of Surgery: Shower with CHG soap. Do not wear jewelry. Do not wear lotions, powders, colognes, or deodorant. Do not shave 48 hours prior to surgery.  Men may shave face and neck. Do not bring valuables to the hospital. [facility] is not responsible for any belongings or valuables. Wear Clean/Comfortable clothing the morning of surgery Remember to brush your teeth WITH YOUR REGULAR TOOTHPASTE.   Please read over the following fact sheets that you were given.

## 2021-07-25 ENCOUNTER — Other Ambulatory Visit: Payer: Self-pay

## 2021-07-25 ENCOUNTER — Encounter (HOSPITAL_COMMUNITY)
Admission: RE | Admit: 2021-07-25 | Discharge: 2021-07-25 | Disposition: A | Discharge status: Home / Self Care | Payer: Medicare Other | Source: Ambulatory Visit | Attending: Vascular Surgery | Admitting: Vascular Surgery

## 2021-07-25 ENCOUNTER — Ambulatory Visit: Payer: Medicare Other | Admitting: Cardiovascular Disease

## 2021-07-25 ENCOUNTER — Encounter (HOSPITAL_COMMUNITY): Payer: Self-pay

## 2021-07-25 DIAGNOSIS — Z01812 Encounter for preprocedural laboratory examination: Secondary | ICD-10-CM | POA: Insufficient documentation

## 2021-07-25 DIAGNOSIS — Z20822 Contact with and (suspected) exposure to covid-19: Secondary | ICD-10-CM | POA: Insufficient documentation

## 2021-07-25 DIAGNOSIS — I70229 Atherosclerosis of native arteries of extremities with rest pain, unspecified extremity: Secondary | ICD-10-CM | POA: Insufficient documentation

## 2021-07-25 LAB — URINALYSIS, ROUTINE W REFLEX MICROSCOPIC
Bacteria, UA: NONE SEEN
Bilirubin Urine: NEGATIVE
Glucose, UA: >=500 mg/dL — AB
Hgb urine dipstick: NEGATIVE
Ketones, ur: NEGATIVE mg/dL
Leukocytes,Ua: NEGATIVE
Nitrite: NEGATIVE
Protein, ur: NEGATIVE mg/dL
Specific Gravity, Urine: 1.02 (ref 1.005–1.030)
pH: 5 (ref 5.0–8.0)

## 2021-07-25 LAB — SARS CORONAVIRUS 2 (TAT 6-24 HRS)
SARS Coronavirus 2: NEGATIVE
SARS Coronavirus 2: NEGATIVE

## 2021-07-25 LAB — SURGICAL PCR SCREEN
MRSA, PCR: NEGATIVE
Staphylococcus aureus: NEGATIVE

## 2021-07-25 LAB — APTT: aPTT: 25 seconds (ref 24–36)

## 2021-07-25 LAB — PROTIME-INR
INR: 1 (ref 0.8–1.2)
Prothrombin Time: 12.9 seconds (ref 11.4–15.2)

## 2021-07-25 LAB — GLUCOSE, CAPILLARY: Glucose-Capillary: 123 mg/dL — ABNORMAL HIGH (ref 70–99)

## 2021-07-25 NOTE — Progress Notes (Signed)
PCP - Dr. Jonathon Jordan Cardiologist - Dr. Sallyanne Kuster  Chest x-ray - 06/06/21 EKG - 07/03/21 Stress Test - 02/09/16 ECHO - 07/11/20 Cardiac Cath - 20+ years ago  Sleep Study - denies CPAP - denies  Fasting Blood Sugar - 70-80's, recently had med adjustments. Pt states he was being over medicated for DM. Checks Blood Sugar 1 time a day (fasting) CBG at PAT 123 Last A1C reported to be 6.0 in the last 60 days. Missed A1C lab during PAT appointment. Will have to collect DOS.   Blood Thinner Instructions: Eliquis, per cards, pt was to continue hold on Eliquis since last procedure. Pt's last dose was 07/17/21. Aspirin Instructions: n/a   COVID TEST- Covid neg on 07/24/21 (too early for procedure). Re-tested in PAT on 07/25/21, results pending.    Anesthesia review: Yes, cardiac history. ECHO scheduled for 07/26/21.   Patient denies shortness of breath, fever, cough and chest pain at PAT appointment   All instructions explained to the patient, with a verbal understanding of the material. Patient agrees to go over the instructions while at home for a better understanding. Patient also instructed to self quarantine after being tested for COVID-19. The opportunity to ask questions was provided.

## 2021-07-26 ENCOUNTER — Ambulatory Visit (HOSPITAL_BASED_OUTPATIENT_CLINIC_OR_DEPARTMENT_OTHER): Payer: Medicare Other

## 2021-07-26 ENCOUNTER — Encounter (HOSPITAL_COMMUNITY): Payer: Self-pay | Admitting: Cardiology

## 2021-07-26 DIAGNOSIS — I77810 Thoracic aortic ectasia: Secondary | ICD-10-CM | POA: Insufficient documentation

## 2021-07-26 DIAGNOSIS — I480 Paroxysmal atrial fibrillation: Secondary | ICD-10-CM | POA: Insufficient documentation

## 2021-07-26 DIAGNOSIS — I2510 Atherosclerotic heart disease of native coronary artery without angina pectoris: Secondary | ICD-10-CM | POA: Insufficient documentation

## 2021-07-26 NOTE — Progress Notes (Unsigned)
Patient ID: Paul [name], adult   DOB: September 30, 1941, 81 y.o.   MRN: [id number]  Patient declined the administration of Definity.

## 2021-07-26 NOTE — Progress Notes (Signed)
Anesthesia Chart Review:  Follows with cardiology for history of extensive atherosclerotic disease involving the coronary and lower extremity arterial circulation (remote PCI of the LAD in 1988), permanent atrial fibrillation,  hyperlipidemia, hypertension. Nuclear study 3/17 showed EF 65, attenuation artifact and no ischemia. Diagnosed with recurrent atrial fibrillation April 2021.  Echocardiogram August 2021 showed normal LV function, mild LVH, mild right ventricular enlargement, moderate right atrial enlargement and dilated ascending aorta measuring 43 mm. Last seen by Dr. Sallyanne Kuster 07/21/2021 and discussed upcoming surgery.  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  Plan for femorofemoral bypass next week with Dr. 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 Melvyn Novas. Pt prescribed 4L O2 at night and with activity to maintain sats >90%. He is maintained on symbicort and spiriva, although notes indicate poor compliance. Treated for COPD exacerbation on 06/07/21 with augmentin x 10d.   Preop labs reviewed, glucose elevated at 252, otherwise unremarkable.  EKG 07/03/2021: Atrial fibrillation.  Rate 78.  Bifascicular block.  TTE 07/26/21:  1. Left ventricular ejection fraction, by estimation, is 55 to 60%. The  left ventricle has normal function. The left ventricle has no regional  wall motion abnormalities. Left ventricular diastolic function could not  be evaluated. There is the  interventricular septum is flattened in systole, consistent with right  ventricular pressure overload.   2. Right ventricular systolic function is mildly reduced. The right  ventricular size is moderately enlarged. There is moderately elevated  pulmonary artery systolic pressure.   3. Left atrial size was moderately dilated.   4. Right atrial size was mild to moderately  dilated.   5. The mitral valve is normal in structure. No evidence of mitral valve  regurgitation.   6. The aortic valve is tricuspid. Aortic valve regurgitation is not  visualized. Mild aortic valve sclerosis is present, with no evidence of  aortic valve stenosis.   7. Aortic dilatation noted. There is mild dilatation of the ascending  aorta, measuring 40 mm. There is mild dilatation of the aortic root,  measuring 39 mm.   8. The inferior vena cava is dilated in size with <50% respiratory  variability, suggesting right atrial pressure of 15 mmHg.   Comparison(s): Prior images reviewed side by side. There is worsened  pulmonary hypertension, increased right atrial pressure. On direct  comparison, there is little change in RV size and systolic function.   Nuclear stress 02/09/2016: No T wave inversion was noted during stress. There was no ST segment deviation noted during stress. Defect 1: There is a medium defect of moderate severity. This is a low risk study. Nuclear stress EF: 65%.   Medium size, moderate intensity fixed inferior attenuation artifact. No reversible ischemia. LVEF 65% with normal wall motion. This is a low risk study.    Wynonia Musty [facility]/Anesthesiology Phone [phone] 07/27/2021 3:01 PM

## 2021-07-27 LAB — ECHOCARDIOGRAM COMPLETE: S' Lateral: 3 cm

## 2021-07-27 NOTE — Anesthesia Preprocedure Evaluation (Addendum)
Anesthesia Evaluation  Patient identified by MRN, date of birth, ID band Patient awake    Reviewed: Allergy & Precautions, H&P , NPO status , Patient's Chart, lab work & pertinent test results  Airway Mallampati: II   Neck ROM: full    Dental   Pulmonary asthma , COPD,  oxygen dependent, Current Smoker,    breath sounds clear to auscultation       Cardiovascular hypertension, + CAD  + dysrhythmias Atrial Fibrillation  Rhythm:irregular Rate:Normal  TTE (07/26/21): EF 55-60%. Normal valve function.   Neuro/Psych    GI/Hepatic GERD  ,  Endo/Other  diabetes, Type 2  Renal/GU      Musculoskeletal   Abdominal   Peds  Hematology   Anesthesia Other Findings   Reproductive/Obstetrics                            Anesthesia Physical Anesthesia Plan  ASA: 3  Anesthesia Plan: General   Post-op Pain Management:    Induction: Intravenous  PONV Risk Score and Plan: 1 and Ondansetron, Dexamethasone, Midazolam and Treatment may vary due to age or medical condition  Airway Management Planned: Oral ETT  Additional Equipment:   Intra-op Plan:   Post-operative Plan: Extubation in OR  Informed Consent: I have reviewed the patients History and Physical, chart, labs and discussed the procedure including the risks, benefits and alternatives for the proposed anesthesia with the patient or authorized representative who has indicated his/her understanding and acceptance.     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 Last seen by Dr. Sallyanne Kuster 07/21/2021 and discussed upcoming surgery.  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 Plan for femorofemoral bypass next week with Dr. 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 Melvyn Novas. Pt prescribed 4L O2 at night and with activity to maintain sats >90%. He is maintained on symbicort and spiriva, although notes indicate poor compliance. Treated for COPD exacerbation on 06/07/21 with augmentin x 10d.   Preop labs reviewed, glucose elevated at 252, otherwise unremarkable.  EKG 07/03/2021: Atrial fibrillation.  Rate 78.  Bifascicular block.  TTE 07/26/21: 1. Left ventricular ejection fraction, by estimation, is 55 to 60%. The  left ventricle has normal function. The left ventricle has no regional  wall motion abnormalities. Left ventricular diastolic function could not  be evaluated. There is the  interventricular septum is flattened in systole, consistent with right  ventricular pressure overload.  2. Right ventricular systolic function is mildly reduced. The right  ventricular size is moderately enlarged. There is moderately elevated  pulmonary artery systolic pressure.  3. Left atrial size was moderately dilated.  4. Right atrial size was mild to moderately dilated.  5. The mitral valve is normal in structure. No evidence of mitral valve  regurgitation.  6. The aortic valve is tricuspid. Aortic valve regurgitation is not  visualized.  Mild aortic valve sclerosis is present, with no evidence of  aortic valve stenosis.  7. Aortic dilatation noted. There is mild dilatation  of the ascending  aorta, measuring 40 mm. There is mild dilatation of the aortic root,  measuring 39 mm.  8. The inferior vena cava is dilated in size with <50% respiratory  variability, suggesting right atrial pressure of 15 mmHg.   Comparison(s): Prior images reviewed side by side. There is worsened  pulmonary hypertension, increased right atrial pressure. On direct  comparison, there is little change in RV size and systolic function.   Nuclear stress 02/09/2016: . No T wave inversion was noted during stress. . There was no ST segment deviation noted during stress. . Defect 1: There is a medium defect of moderate severity. . This is a low risk study. . Nuclear stress EF: 65%.  Medium size, moderate intensity fixed inferior attenuation artifact. No reversible ischemia. LVEF 65% with normal wall motion. This is a low risk study.  )       Anesthesia Quick Evaluation

## 2021-07-28 ENCOUNTER — Inpatient Hospital Stay (HOSPITAL_COMMUNITY)
Admission: RE | Admit: 2021-07-28 | Discharge: 2021-08-12 | DRG: 270 | Disposition: E | Payer: Medicare Other | Attending: Vascular Surgery | Admitting: Vascular Surgery

## 2021-07-28 ENCOUNTER — Encounter (HOSPITAL_COMMUNITY): Admission: RE | Disposition: E | Payer: Self-pay | Source: Home / Self Care | Attending: Vascular Surgery

## 2021-07-28 ENCOUNTER — Inpatient Hospital Stay (HOSPITAL_COMMUNITY): Payer: Medicare Other | Admitting: Certified Registered Nurse Anesthetist

## 2021-07-28 ENCOUNTER — Inpatient Hospital Stay (HOSPITAL_COMMUNITY): Payer: Medicare Other | Admitting: Physician Assistant

## 2021-07-28 ENCOUNTER — Encounter (HOSPITAL_COMMUNITY): Payer: Self-pay | Admitting: Vascular Surgery

## 2021-07-28 DIAGNOSIS — J95821 Acute postprocedural respiratory failure: Secondary | ICD-10-CM | POA: Diagnosis not present

## 2021-07-28 DIAGNOSIS — E1151 Type 2 diabetes mellitus with diabetic peripheral angiopathy without gangrene: Secondary | ICD-10-CM | POA: Diagnosis not present

## 2021-07-28 DIAGNOSIS — I97618 Postprocedural hemorrhage and hematoma of a circulatory system organ or structure following other circulatory system procedure: Secondary | ICD-10-CM | POA: Diagnosis not present

## 2021-07-28 DIAGNOSIS — Z419 Encounter for procedure for purposes other than remedying health state, unspecified: Secondary | ICD-10-CM

## 2021-07-28 DIAGNOSIS — Z515 Encounter for palliative care: Secondary | ICD-10-CM | POA: Diagnosis not present

## 2021-07-28 DIAGNOSIS — Z4659 Encounter for fitting and adjustment of other gastrointestinal appliance and device: Secondary | ICD-10-CM

## 2021-07-28 DIAGNOSIS — I10 Essential (primary) hypertension: Secondary | ICD-10-CM | POA: Diagnosis present

## 2021-07-28 DIAGNOSIS — E1165 Type 2 diabetes mellitus with hyperglycemia: Secondary | ICD-10-CM | POA: Diagnosis present

## 2021-07-28 DIAGNOSIS — E874 Mixed disorder of acid-base balance: Secondary | ICD-10-CM | POA: Diagnosis not present

## 2021-07-28 DIAGNOSIS — K9189 Other postprocedural complications and disorders of digestive system: Secondary | ICD-10-CM | POA: Diagnosis not present

## 2021-07-28 DIAGNOSIS — N179 Acute kidney failure, unspecified: Secondary | ICD-10-CM | POA: Diagnosis not present

## 2021-07-28 DIAGNOSIS — Z86711 Personal history of pulmonary embolism: Secondary | ICD-10-CM

## 2021-07-28 DIAGNOSIS — Z452 Encounter for adjustment and management of vascular access device: Secondary | ICD-10-CM | POA: Diagnosis not present

## 2021-07-28 DIAGNOSIS — D62 Acute posthemorrhagic anemia: Secondary | ICD-10-CM | POA: Diagnosis not present

## 2021-07-28 DIAGNOSIS — I4821 Permanent atrial fibrillation: Secondary | ICD-10-CM | POA: Diagnosis not present

## 2021-07-28 DIAGNOSIS — J449 Chronic obstructive pulmonary disease, unspecified: Secondary | ICD-10-CM | POA: Diagnosis present

## 2021-07-28 DIAGNOSIS — I700 Atherosclerosis of aorta: Secondary | ICD-10-CM | POA: Diagnosis not present

## 2021-07-28 DIAGNOSIS — J9611 Chronic respiratory failure with hypoxia: Secondary | ICD-10-CM | POA: Diagnosis present

## 2021-07-28 DIAGNOSIS — I2510 Atherosclerotic heart disease of native coronary artery without angina pectoris: Secondary | ICD-10-CM | POA: Diagnosis present

## 2021-07-28 DIAGNOSIS — E8770 Fluid overload, unspecified: Secondary | ICD-10-CM | POA: Diagnosis not present

## 2021-07-28 DIAGNOSIS — Z9049 Acquired absence of other specified parts of digestive tract: Secondary | ICD-10-CM

## 2021-07-28 DIAGNOSIS — I97638 Postprocedural hematoma of a circulatory system organ or structure following other circulatory system procedure: Secondary | ICD-10-CM | POA: Diagnosis not present

## 2021-07-28 DIAGNOSIS — J9690 Respiratory failure, unspecified, unspecified whether with hypoxia or hypercapnia: Secondary | ICD-10-CM

## 2021-07-28 DIAGNOSIS — I70229 Atherosclerosis of native arteries of extremities with rest pain, unspecified extremity: Secondary | ICD-10-CM | POA: Diagnosis not present

## 2021-07-28 DIAGNOSIS — R54 Age-related physical debility: Secondary | ICD-10-CM | POA: Diagnosis present

## 2021-07-28 DIAGNOSIS — I4819 Other persistent atrial fibrillation: Secondary | ICD-10-CM | POA: Diagnosis not present

## 2021-07-28 DIAGNOSIS — I959 Hypotension, unspecified: Secondary | ICD-10-CM | POA: Diagnosis not present

## 2021-07-28 DIAGNOSIS — E870 Hyperosmolality and hypernatremia: Secondary | ICD-10-CM | POA: Diagnosis not present

## 2021-07-28 DIAGNOSIS — Z7982 Long term (current) use of aspirin: Secondary | ICD-10-CM

## 2021-07-28 DIAGNOSIS — Z6839 Body mass index (BMI) 39.0-39.9, adult: Secondary | ICD-10-CM | POA: Diagnosis not present

## 2021-07-28 DIAGNOSIS — E7800 Pure hypercholesterolemia, unspecified: Secondary | ICD-10-CM | POA: Diagnosis not present

## 2021-07-28 DIAGNOSIS — J9811 Atelectasis: Secondary | ICD-10-CM | POA: Diagnosis not present

## 2021-07-28 DIAGNOSIS — M4855XA Collapsed vertebra, not elsewhere classified, thoracolumbar region, initial encounter for fracture: Secondary | ICD-10-CM | POA: Diagnosis not present

## 2021-07-28 DIAGNOSIS — J441 Chronic obstructive pulmonary disease with (acute) exacerbation: Secondary | ICD-10-CM | POA: Diagnosis not present

## 2021-07-28 DIAGNOSIS — Z7984 Long term (current) use of oral hypoglycemic drugs: Secondary | ICD-10-CM

## 2021-07-28 DIAGNOSIS — I248 Other forms of acute ischemic heart disease: Secondary | ICD-10-CM | POA: Diagnosis not present

## 2021-07-28 DIAGNOSIS — G9341 Metabolic encephalopathy: Secondary | ICD-10-CM | POA: Diagnosis not present

## 2021-07-28 DIAGNOSIS — I482 Chronic atrial fibrillation, unspecified: Secondary | ICD-10-CM | POA: Diagnosis not present

## 2021-07-28 DIAGNOSIS — J90 Pleural effusion, not elsewhere classified: Secondary | ICD-10-CM | POA: Diagnosis not present

## 2021-07-28 DIAGNOSIS — R579 Shock, unspecified: Secondary | ICD-10-CM | POA: Diagnosis not present

## 2021-07-28 DIAGNOSIS — R911 Solitary pulmonary nodule: Secondary | ICD-10-CM | POA: Diagnosis present

## 2021-07-28 DIAGNOSIS — Z20822 Contact with and (suspected) exposure to covid-19: Secondary | ICD-10-CM | POA: Diagnosis not present

## 2021-07-28 DIAGNOSIS — D3501 Benign neoplasm of right adrenal gland: Secondary | ICD-10-CM | POA: Diagnosis not present

## 2021-07-28 DIAGNOSIS — E1140 Type 2 diabetes mellitus with diabetic neuropathy, unspecified: Secondary | ICD-10-CM | POA: Diagnosis present

## 2021-07-28 DIAGNOSIS — E878 Other disorders of electrolyte and fluid balance, not elsewhere classified: Secondary | ICD-10-CM | POA: Diagnosis present

## 2021-07-28 DIAGNOSIS — I2723 Pulmonary hypertension due to lung diseases and hypoxia: Secondary | ICD-10-CM | POA: Diagnosis present

## 2021-07-28 DIAGNOSIS — Z9889 Other specified postprocedural states: Secondary | ICD-10-CM

## 2021-07-28 DIAGNOSIS — Z7901 Long term (current) use of anticoagulants: Secondary | ICD-10-CM

## 2021-07-28 DIAGNOSIS — E875 Hyperkalemia: Secondary | ICD-10-CM

## 2021-07-28 DIAGNOSIS — I70222 Atherosclerosis of native arteries of extremities with rest pain, left leg: Secondary | ICD-10-CM | POA: Diagnosis not present

## 2021-07-28 DIAGNOSIS — D696 Thrombocytopenia, unspecified: Secondary | ICD-10-CM | POA: Diagnosis not present

## 2021-07-28 DIAGNOSIS — R001 Bradycardia, unspecified: Secondary | ICD-10-CM | POA: Diagnosis not present

## 2021-07-28 DIAGNOSIS — Z66 Do not resuscitate: Secondary | ICD-10-CM | POA: Diagnosis not present

## 2021-07-28 DIAGNOSIS — Z95828 Presence of other vascular implants and grafts: Secondary | ICD-10-CM

## 2021-07-28 DIAGNOSIS — T82868A Thrombosis of vascular prosthetic devices, implants and grafts, initial encounter: Secondary | ICD-10-CM | POA: Diagnosis not present

## 2021-07-28 DIAGNOSIS — E876 Hypokalemia: Secondary | ICD-10-CM | POA: Diagnosis not present

## 2021-07-28 DIAGNOSIS — E440 Moderate protein-calorie malnutrition: Secondary | ICD-10-CM | POA: Insufficient documentation

## 2021-07-28 DIAGNOSIS — Z888 Allergy status to other drugs, medicaments and biological substances status: Secondary | ICD-10-CM

## 2021-07-28 DIAGNOSIS — I517 Cardiomegaly: Secondary | ICD-10-CM | POA: Diagnosis not present

## 2021-07-28 DIAGNOSIS — Z8673 Personal history of transient ischemic attack (TIA), and cerebral infarction without residual deficits: Secondary | ICD-10-CM

## 2021-07-28 DIAGNOSIS — R0902 Hypoxemia: Secondary | ICD-10-CM

## 2021-07-28 DIAGNOSIS — Z9289 Personal history of other medical treatment: Secondary | ICD-10-CM

## 2021-07-28 DIAGNOSIS — Z885 Allergy status to narcotic agent status: Secondary | ICD-10-CM

## 2021-07-28 DIAGNOSIS — T82392A Other mechanical complication of femoral arterial graft (bypass), initial encounter: Secondary | ICD-10-CM | POA: Diagnosis not present

## 2021-07-28 DIAGNOSIS — I4891 Unspecified atrial fibrillation: Secondary | ICD-10-CM | POA: Diagnosis not present

## 2021-07-28 DIAGNOSIS — J811 Chronic pulmonary edema: Secondary | ICD-10-CM | POA: Diagnosis not present

## 2021-07-28 DIAGNOSIS — I70223 Atherosclerosis of native arteries of extremities with rest pain, bilateral legs: Secondary | ICD-10-CM | POA: Diagnosis present

## 2021-07-28 DIAGNOSIS — R9431 Abnormal electrocardiogram [ECG] [EKG]: Secondary | ICD-10-CM | POA: Diagnosis not present

## 2021-07-28 DIAGNOSIS — I739 Peripheral vascular disease, unspecified: Secondary | ICD-10-CM | POA: Diagnosis not present

## 2021-07-28 DIAGNOSIS — I480 Paroxysmal atrial fibrillation: Secondary | ICD-10-CM | POA: Diagnosis not present

## 2021-07-28 DIAGNOSIS — R509 Fever, unspecified: Secondary | ICD-10-CM | POA: Diagnosis not present

## 2021-07-28 DIAGNOSIS — D179 Benign lipomatous neoplasm, unspecified: Secondary | ICD-10-CM | POA: Diagnosis not present

## 2021-07-28 DIAGNOSIS — K219 Gastro-esophageal reflux disease without esophagitis: Secondary | ICD-10-CM | POA: Diagnosis present

## 2021-07-28 DIAGNOSIS — I745 Embolism and thrombosis of iliac artery: Secondary | ICD-10-CM | POA: Diagnosis present

## 2021-07-28 DIAGNOSIS — J189 Pneumonia, unspecified organism: Secondary | ICD-10-CM | POA: Diagnosis not present

## 2021-07-28 DIAGNOSIS — Z85118 Personal history of other malignant neoplasm of bronchus and lung: Secondary | ICD-10-CM

## 2021-07-28 DIAGNOSIS — E785 Hyperlipidemia, unspecified: Secondary | ICD-10-CM | POA: Diagnosis present

## 2021-07-28 DIAGNOSIS — T8132XA Disruption of internal operation (surgical) wound, not elsewhere classified, initial encounter: Secondary | ICD-10-CM | POA: Diagnosis not present

## 2021-07-28 DIAGNOSIS — I70292 Other atherosclerosis of native arteries of extremities, left leg: Secondary | ICD-10-CM | POA: Diagnosis not present

## 2021-07-28 DIAGNOSIS — Z9981 Dependence on supplemental oxygen: Secondary | ICD-10-CM

## 2021-07-28 DIAGNOSIS — D175 Benign lipomatous neoplasm of intra-abdominal organs: Secondary | ICD-10-CM | POA: Diagnosis not present

## 2021-07-28 DIAGNOSIS — T82898A Other specified complication of vascular prosthetic devices, implants and grafts, initial encounter: Secondary | ICD-10-CM | POA: Diagnosis not present

## 2021-07-28 DIAGNOSIS — Z8249 Family history of ischemic heart disease and other diseases of the circulatory system: Secondary | ICD-10-CM

## 2021-07-28 DIAGNOSIS — R6521 Severe sepsis with septic shock: Secondary | ICD-10-CM | POA: Diagnosis not present

## 2021-07-28 DIAGNOSIS — J9620 Acute and chronic respiratory failure, unspecified whether with hypoxia or hypercapnia: Secondary | ICD-10-CM

## 2021-07-28 DIAGNOSIS — J80 Acute respiratory distress syndrome: Secondary | ICD-10-CM

## 2021-07-28 DIAGNOSIS — R578 Other shock: Secondary | ICD-10-CM | POA: Diagnosis not present

## 2021-07-28 DIAGNOSIS — T8132XD Disruption of internal operation (surgical) wound, not elsewhere classified, subsequent encounter: Secondary | ICD-10-CM | POA: Diagnosis not present

## 2021-07-28 DIAGNOSIS — I5189 Other ill-defined heart diseases: Secondary | ICD-10-CM | POA: Diagnosis not present

## 2021-07-28 DIAGNOSIS — J9601 Acute respiratory failure with hypoxia: Secondary | ICD-10-CM | POA: Diagnosis not present

## 2021-07-28 DIAGNOSIS — A419 Sepsis, unspecified organism: Secondary | ICD-10-CM | POA: Diagnosis not present

## 2021-07-28 DIAGNOSIS — I70202 Unspecified atherosclerosis of native arteries of extremities, left leg: Secondary | ICD-10-CM | POA: Diagnosis not present

## 2021-07-28 DIAGNOSIS — Y832 Surgical operation with anastomosis, bypass or graft as the cause of abnormal reaction of the patient, or of later complication, without mention of misadventure at the time of the procedure: Secondary | ICD-10-CM | POA: Diagnosis not present

## 2021-07-28 DIAGNOSIS — H9190 Unspecified hearing loss, unspecified ear: Secondary | ICD-10-CM | POA: Diagnosis present

## 2021-07-28 DIAGNOSIS — Z4682 Encounter for fitting and adjustment of non-vascular catheter: Secondary | ICD-10-CM | POA: Diagnosis not present

## 2021-07-28 DIAGNOSIS — I2609 Other pulmonary embolism with acute cor pulmonale: Secondary | ICD-10-CM | POA: Diagnosis not present

## 2021-07-28 DIAGNOSIS — Z79899 Other long term (current) drug therapy: Secondary | ICD-10-CM

## 2021-07-28 DIAGNOSIS — L8990 Pressure ulcer of unspecified site, unspecified stage: Secondary | ICD-10-CM | POA: Insufficient documentation

## 2021-07-28 DIAGNOSIS — S301XXA Contusion of abdominal wall, initial encounter: Secondary | ICD-10-CM | POA: Diagnosis not present

## 2021-07-28 DIAGNOSIS — F17210 Nicotine dependence, cigarettes, uncomplicated: Secondary | ICD-10-CM | POA: Diagnosis present

## 2021-07-28 DIAGNOSIS — R609 Edema, unspecified: Secondary | ICD-10-CM | POA: Diagnosis not present

## 2021-07-28 HISTORY — PX: FEMORAL-FEMORAL BYPASS GRAFT: SHX936

## 2021-07-28 LAB — CREATININE, SERUM
Creatinine, Ser: 0.71 mg/dL (ref 0.61–1.24)
GFR, Estimated: >60 mL/min (ref >60)

## 2021-07-28 LAB — GLUCOSE, CAPILLARY
Glucose-Capillary: 142 mg/dL — ABNORMAL HIGH (ref 70–99)
Glucose-Capillary: 187 mg/dL — ABNORMAL HIGH (ref 70–99)
Glucose-Capillary: 168 mg/dL — ABNORMAL HIGH (ref 70–99)
Glucose-Capillary: 202 mg/dL — ABNORMAL HIGH (ref 70–99)

## 2021-07-28 LAB — CBC
HCT: 33.7 % — ABNORMAL LOW (ref 39.0–52.0)
Hemoglobin: 10.8 g/dL — ABNORMAL LOW (ref 13.0–17.0)
MCH: 28.4 pg (ref 26.0–34.0)
MCHC: 32 g/dL (ref 30.0–36.0)
MCV: 88.7 fL (ref 80.0–100.0)
Platelets: 253 10*3/uL (ref 150–400)
RBC: 3.8 MIL/uL — ABNORMAL LOW (ref 4.22–5.81)
RDW: 15.6 % — ABNORMAL HIGH (ref 11.5–15.5)
WBC: 12.2 10*3/uL — ABNORMAL HIGH (ref 4.0–10.5)
nRBC: 0 % (ref 0.0–0.2)

## 2021-07-28 LAB — HEMOGLOBIN A1C
Hgb A1c MFr Bld: 6.6 % — ABNORMAL HIGH (ref 4.8–5.6)
Mean Plasma Glucose: 142.72 mg/dL

## 2021-07-28 SURGERY — CREATION, BYPASS, ARTERIAL, FEMORAL TO FEMORAL, USING GRAFT
Anesthesia: General | Site: Groin | Laterality: Bilateral

## 2021-07-28 MED ORDER — ATORVASTATIN CALCIUM 40 MG PO TABS
40.0000 mg | ORAL_TABLET | Freq: Every day | ORAL | Status: DC
  Administered 2021-07-28 – 2021-08-01 (×5): 40 mg via ORAL
  Filled 2021-07-28 (×3): qty 1
  Filled 2021-07-28: qty 4
  Filled 2021-07-28: qty 1

## 2021-07-28 MED ORDER — ALUM & MAG HYDROXIDE-SIMETH 200-200-20 MG/5ML PO SUSP: 15.0000 mL | ORAL | Status: DC | PRN

## 2021-07-28 MED ORDER — METFORMIN HCL 500 MG PO TABS
1000.0000 mg | ORAL_TABLET | Freq: Two times a day (BID) | ORAL | Status: DC
  Administered 2021-07-29 – 2021-08-01 (×7): 1000 mg via ORAL
  Filled 2021-07-28 (×8): qty 2

## 2021-07-28 MED ORDER — ALBUMIN HUMAN 5 % IV SOLN: INTRAVENOUS | Status: DC | PRN

## 2021-07-28 MED ORDER — ONDANSETRON HCL 4 MG/2ML IJ SOLN
4.0000 mg | Freq: Four times a day (QID) | INTRAMUSCULAR | Status: AC | PRN
  Administered 2021-07-28: 4 mg via INTRAVENOUS

## 2021-07-28 MED ORDER — ONDANSETRON HCL 4 MG/2ML IJ SOLN
INTRAMUSCULAR | Status: DC | PRN
  Administered 2021-07-28: 4 mg via INTRAVENOUS

## 2021-07-28 MED ORDER — OXYCODONE HCL 5 MG PO TABS: 5.0000 mg | ORAL_TABLET | Freq: Once | ORAL | Status: DC | PRN

## 2021-07-28 MED ORDER — SODIUM CHLORIDE 0.9 % IV SOLN: INTRAVENOUS | Status: DC

## 2021-07-28 MED ORDER — CEFAZOLIN SODIUM-DEXTROSE 2-4 GM/100ML-% IV SOLN
2.0000 g | INTRAVENOUS | Status: AC
  Administered 2021-07-28: 2 g via INTRAVENOUS
  Filled 2021-07-28: qty 100

## 2021-07-28 MED ORDER — OXYCODONE-ACETAMINOPHEN 5-325 MG PO TABS
1.0000 | ORAL_TABLET | ORAL | Status: DC | PRN
  Administered 2021-07-28: 2 via ORAL
  Administered 2021-07-28: 1 via ORAL
  Administered 2021-07-30 (×2): 2 via ORAL
  Filled 2021-07-28 (×3): qty 2
  Filled 2021-07-28: qty 1

## 2021-07-28 MED ORDER — PROPOFOL 10 MG/ML IV BOLUS
INTRAVENOUS | Status: AC
  Filled 2021-07-28: qty 20

## 2021-07-28 MED ORDER — FENTANYL CITRATE (PF) 250 MCG/5ML IJ SOLN
INTRAMUSCULAR | Status: AC
  Filled 2021-07-28: qty 5

## 2021-07-28 MED ORDER — ONDANSETRON HCL 4 MG/2ML IJ SOLN
INTRAMUSCULAR | Status: AC
  Filled 2021-07-28: qty 2

## 2021-07-28 MED ORDER — METOPROLOL TARTRATE 5 MG/5ML IV SOLN
2.0000 mg | INTRAVENOUS | Status: AC | PRN
  Administered 2021-07-30 (×2): 5 mg via INTRAVENOUS
  Filled 2021-07-28 (×2): qty 5

## 2021-07-28 MED ORDER — MORPHINE SULFATE (PF) 2 MG/ML IV SOLN
2.0000 mg | INTRAVENOUS | Status: DC | PRN
  Administered 2021-07-29 – 2021-07-31 (×2): 2 mg via INTRAVENOUS
  Filled 2021-07-28 (×2): qty 1

## 2021-07-28 MED ORDER — CHLORHEXIDINE GLUCONATE CLOTH 2 % EX PADS: 6.0000 | MEDICATED_PAD | Freq: Once | CUTANEOUS | Status: DC

## 2021-07-28 MED ORDER — FENTANYL CITRATE (PF) 250 MCG/5ML IJ SOLN
INTRAMUSCULAR | Status: DC | PRN
  Administered 2021-07-28: 100 ug via INTRAVENOUS
  Administered 2021-07-28: 50 ug via INTRAVENOUS

## 2021-07-28 MED ORDER — GUAIFENESIN-DM 100-10 MG/5ML PO SYRP
15.0000 mL | ORAL_SOLUTION | ORAL | Status: DC | PRN
  Administered 2021-07-31 – 2021-08-07 (×2): 15 mL via ORAL
  Filled 2021-07-28 (×2): qty 15

## 2021-07-28 MED ORDER — OXYCODONE HCL 5 MG/5ML PO SOLN: 5.0000 mg | Freq: Once | ORAL | Status: DC | PRN

## 2021-07-28 MED ORDER — PHENOL 1.4 % MT LIQD: 1.0000 | OROMUCOSAL | Status: DC | PRN

## 2021-07-28 MED ORDER — HEPARIN SODIUM (PORCINE) 1000 UNIT/ML IJ SOLN
INTRAMUSCULAR | Status: DC | PRN
  Administered 2021-07-28: 9000 [IU] via INTRAVENOUS

## 2021-07-28 MED ORDER — PROPOFOL 10 MG/ML IV BOLUS
INTRAVENOUS | Status: DC | PRN
  Administered 2021-07-28: 90 mg via INTRAVENOUS

## 2021-07-28 MED ORDER — EMPAGLIFLOZIN 25 MG PO TABS
25.0000 mg | ORAL_TABLET | Freq: Every day | ORAL | Status: DC
  Administered 2021-07-29 – 2021-08-01 (×4): 25 mg via ORAL
  Filled 2021-07-28 (×5): qty 1

## 2021-07-28 MED ORDER — ONDANSETRON HCL 4 MG/2ML IJ SOLN
4.0000 mg | Freq: Four times a day (QID) | INTRAMUSCULAR | Status: DC | PRN
  Administered 2021-07-28 – 2021-08-01 (×2): 4 mg via INTRAVENOUS
  Filled 2021-07-28: qty 2

## 2021-07-28 MED ORDER — LIDOCAINE 2% (20 MG/ML) 5 ML SYRINGE
INTRAMUSCULAR | Status: AC
  Filled 2021-07-28: qty 5

## 2021-07-28 MED ORDER — SUGAMMADEX SODIUM 200 MG/2ML IV SOLN
INTRAVENOUS | Status: DC | PRN
  Administered 2021-07-28: 200 mg via INTRAVENOUS

## 2021-07-28 MED ORDER — PROTAMINE SULFATE 10 MG/ML IV SOLN
INTRAVENOUS | Status: DC | PRN
  Administered 2021-07-28: 40 mg via INTRAVENOUS

## 2021-07-28 MED ORDER — DEXAMETHASONE SODIUM PHOSPHATE 10 MG/ML IJ SOLN
INTRAMUSCULAR | Status: DC | PRN
  Administered 2021-07-28: 5 mg via INTRAVENOUS

## 2021-07-28 MED ORDER — ALBUTEROL SULFATE (2.5 MG/3ML) 0.083% IN NEBU
3.0000 mL | INHALATION_SOLUTION | RESPIRATORY_TRACT | Status: DC | PRN
  Administered 2021-07-29 – 2021-07-31 (×3): 3 mL via RESPIRATORY_TRACT
  Filled 2021-07-28 (×4): qty 3

## 2021-07-28 MED ORDER — POTASSIUM CHLORIDE CRYS ER 20 MEQ PO TBCR: 20.0000 meq | EXTENDED_RELEASE_TABLET | Freq: Every day | ORAL | Status: DC | PRN

## 2021-07-28 MED ORDER — HEPARIN SODIUM (PORCINE) 5000 UNIT/ML IJ SOLN
5000.0000 [IU] | Freq: Three times a day (TID) | INTRAMUSCULAR | Status: DC
  Administered 2021-07-28 – 2021-07-29 (×2): 5000 [IU] via SUBCUTANEOUS
  Filled 2021-07-28 (×4): qty 1

## 2021-07-28 MED ORDER — HEPARIN 6000 UNIT IRRIGATION SOLUTION
Status: DC | PRN
  Administered 2021-07-28: 1

## 2021-07-28 MED ORDER — ACETAMINOPHEN 325 MG PO TABS: 325.0000 mg | ORAL_TABLET | ORAL | Status: DC | PRN

## 2021-07-28 MED ORDER — MAGNESIUM SULFATE 2 GM/50ML IV SOLN: 2.0000 g | Freq: Every day | INTRAVENOUS | Status: DC | PRN

## 2021-07-28 MED ORDER — FENTANYL CITRATE (PF) 100 MCG/2ML IJ SOLN
INTRAMUSCULAR | Status: AC
  Filled 2021-07-28: qty 2

## 2021-07-28 MED ORDER — METOCLOPRAMIDE HCL 5 MG PO TABS
5.0000 mg | ORAL_TABLET | Freq: Three times a day (TID) | ORAL | Status: DC
  Administered 2021-07-28 – 2021-08-01 (×11): 5 mg via ORAL
  Filled 2021-07-28 (×13): qty 1

## 2021-07-28 MED ORDER — ROCURONIUM BROMIDE 10 MG/ML (PF) SYRINGE
PREFILLED_SYRINGE | INTRAVENOUS | Status: AC
  Filled 2021-07-28: qty 10

## 2021-07-28 MED ORDER — GABAPENTIN 400 MG PO CAPS
1200.0000 mg | ORAL_CAPSULE | Freq: Every day | ORAL | Status: DC
  Administered 2021-07-28 – 2021-07-31 (×4): 1200 mg via ORAL
  Filled 2021-07-28 (×4): qty 3

## 2021-07-28 MED ORDER — HEPARIN 6000 UNIT IRRIGATION SOLUTION
Status: AC
  Filled 2021-07-28: qty 500

## 2021-07-28 MED ORDER — FENTANYL CITRATE (PF) 100 MCG/2ML IJ SOLN
25.0000 ug | INTRAMUSCULAR | Status: DC | PRN
  Administered 2021-07-28 (×4): 25 ug via INTRAVENOUS

## 2021-07-28 MED ORDER — INSULIN ASPART 100 UNIT/ML IJ SOLN
0.0000 [IU] | Freq: Three times a day (TID) | INTRAMUSCULAR | Status: DC
  Administered 2021-07-29: 2 [IU] via SUBCUTANEOUS
  Administered 2021-07-29: 3 [IU] via SUBCUTANEOUS
  Administered 2021-07-29: 5 [IU] via SUBCUTANEOUS
  Administered 2021-07-30 – 2021-07-31 (×4): 2 [IU] via SUBCUTANEOUS
  Administered 2021-07-31: 3 [IU] via SUBCUTANEOUS
  Administered 2021-08-01 – 2021-08-02 (×3): 2 [IU] via SUBCUTANEOUS

## 2021-07-28 MED ORDER — DOCUSATE SODIUM 100 MG PO CAPS
100.0000 mg | ORAL_CAPSULE | Freq: Every day | ORAL | Status: DC
  Administered 2021-07-29 – 2021-08-01 (×4): 100 mg via ORAL
  Filled 2021-07-28 (×4): qty 1

## 2021-07-28 MED ORDER — BISACODYL 5 MG PO TBEC: 5.0000 mg | DELAYED_RELEASE_TABLET | Freq: Every day | ORAL | Status: DC | PRN

## 2021-07-28 MED ORDER — CEFAZOLIN SODIUM-DEXTROSE 2-4 GM/100ML-% IV SOLN
2.0000 g | Freq: Three times a day (TID) | INTRAVENOUS | Status: AC
  Administered 2021-07-28 (×2): 2 g via INTRAVENOUS
  Filled 2021-07-28 (×2): qty 100

## 2021-07-28 MED ORDER — HYDRALAZINE HCL 20 MG/ML IJ SOLN: 5.0000 mg | INTRAMUSCULAR | Status: DC | PRN

## 2021-07-28 MED ORDER — POLYETHYLENE GLYCOL 3350 17 G PO PACK: 17.0000 g | PACK | Freq: Every day | ORAL | Status: DC | PRN

## 2021-07-28 MED ORDER — PHENYLEPHRINE HCL-NACL 20-0.9 MG/250ML-% IV SOLN
INTRAVENOUS | Status: DC | PRN
  Administered 2021-07-28: 75 ug/min via INTRAVENOUS

## 2021-07-28 MED ORDER — PANTOPRAZOLE SODIUM 40 MG PO TBEC
40.0000 mg | DELAYED_RELEASE_TABLET | Freq: Every day | ORAL | Status: DC
  Administered 2021-07-29 – 2021-08-01 (×4): 40 mg via ORAL
  Filled 2021-07-28 (×4): qty 1

## 2021-07-28 MED ORDER — ACETAMINOPHEN 325 MG RE SUPP
325.0000 mg | RECTAL | Status: DC | PRN
  Filled 2021-07-28: qty 2

## 2021-07-28 MED ORDER — CHLORHEXIDINE GLUCONATE 0.12 % MT SOLN
OROMUCOSAL | Status: AC
  Administered 2021-07-28: 15 mL
  Filled 2021-07-28: qty 15

## 2021-07-28 MED ORDER — LACTATED RINGERS IV SOLN: INTRAVENOUS | Status: DC | PRN

## 2021-07-28 MED ORDER — 0.9 % SODIUM CHLORIDE (POUR BTL) OPTIME
TOPICAL | Status: DC | PRN
  Administered 2021-07-28: 1000 mL

## 2021-07-28 MED ORDER — LOSARTAN POTASSIUM 50 MG PO TABS
50.0000 mg | ORAL_TABLET | Freq: Every day | ORAL | Status: DC
  Administered 2021-07-29 – 2021-08-01 (×4): 50 mg via ORAL
  Filled 2021-07-28 (×4): qty 1

## 2021-07-28 MED ORDER — DEXAMETHASONE SODIUM PHOSPHATE 10 MG/ML IJ SOLN
INTRAMUSCULAR | Status: AC
  Filled 2021-07-28: qty 1

## 2021-07-28 MED ORDER — ROCURONIUM BROMIDE 10 MG/ML (PF) SYRINGE
PREFILLED_SYRINGE | INTRAVENOUS | Status: DC | PRN
  Administered 2021-07-28: 30 mg via INTRAVENOUS
  Administered 2021-07-28: 50 mg via INTRAVENOUS

## 2021-07-28 MED ORDER — LABETALOL HCL 5 MG/ML IV SOLN
10.0000 mg | INTRAVENOUS | Status: DC | PRN
  Filled 2021-07-28: qty 4

## 2021-07-28 MED ORDER — FUROSEMIDE 20 MG PO TABS
20.0000 mg | ORAL_TABLET | Freq: Every day | ORAL | Status: DC
  Administered 2021-07-29 – 2021-08-02 (×5): 20 mg via ORAL
  Filled 2021-07-28 (×6): qty 1

## 2021-07-28 MED ORDER — SODIUM CHLORIDE 0.9 % IV SOLN: 500.0000 mL | Freq: Once | INTRAVENOUS | Status: DC | PRN

## 2021-07-28 MED ORDER — LIDOCAINE 2% (20 MG/ML) 5 ML SYRINGE
INTRAMUSCULAR | Status: DC | PRN
Start: 2021-07-28 — End: 2021-07-28
  Administered 2021-07-28: 40 mg via INTRAVENOUS

## 2021-07-28 SURGICAL SUPPLY — 53 items
ADH SKN CLS APL DERMABOND .7 (GAUZE/BANDAGES/DRESSINGS) ×1
AGENT HMST SPONGE THK3/8 (HEMOSTASIS)
BAG COUNTER SPONGE SURGICOUNT (BAG) ×2 IMPLANT
BAG SPNG CNTER NS LX DISP (BAG) ×1
BLADE CLIPPER SURG (BLADE) ×2 IMPLANT
CANISTER SUCT 3000ML PPV (MISCELLANEOUS) ×2 IMPLANT
CLIP VESOCCLUDE MED 24/CT (CLIP) ×2 IMPLANT
CLIP VESOCCLUDE SM WIDE 24/CT (CLIP) ×2 IMPLANT
COVER PROBE W GEL 5X96 (DRAPES) ×1 IMPLANT
DERMABOND ADVANCED (GAUZE/BANDAGES/DRESSINGS) ×1
DERMABOND ADVANCED .7 DNX12 (GAUZE/BANDAGES/DRESSINGS) IMPLANT
DRAIN CHANNEL 15F RND FF W/TCR (WOUND CARE) IMPLANT
ELECT CAUTERY BLADE 6.4 (BLADE) ×2 IMPLANT
ELECT REM PT RETURN 9FT ADLT (ELECTROSURGICAL) ×2
ELECTRODE REM PT RTRN 9FT ADLT (ELECTROSURGICAL) ×1 IMPLANT
EVACUATOR SILICONE 100CC (DRAIN) IMPLANT
GAUZE 4X4 16PLY ~~LOC~~+RFID DBL (SPONGE) ×1 IMPLANT
GLOVE SRG 8 PF TXTR STRL LF DI (GLOVE) ×1 IMPLANT
GLOVE SURG ENC MOIS LTX SZ7.5 (GLOVE) ×2 IMPLANT
GLOVE SURG UNDER POLY LF SZ6.5 (GLOVE) ×2 IMPLANT
GLOVE SURG UNDER POLY LF SZ8 (GLOVE) ×2
GOWN STRL REUS W/ TWL LRG LVL3 (GOWN DISPOSABLE) ×2 IMPLANT
GOWN STRL REUS W/ TWL XL LVL3 (GOWN DISPOSABLE) ×2 IMPLANT
GOWN STRL REUS W/TWL LRG LVL3 (GOWN DISPOSABLE) ×4
GOWN STRL REUS W/TWL XL LVL3 (GOWN DISPOSABLE) ×4
GRAFT HEMASHIELD 8MM (Vascular Products) ×2 IMPLANT
GRAFT VASC STRG 30X8KNIT (Vascular Products) IMPLANT
HEMOSTAT SPONGE AVITENE ULTRA (HEMOSTASIS) IMPLANT
INSERT FOGARTY SM (MISCELLANEOUS) ×3 IMPLANT
KIT BASIN OR (CUSTOM PROCEDURE TRAY) ×2 IMPLANT
KIT TURNOVER KIT B (KITS) ×2 IMPLANT
LOOP VESSEL MINI RED (MISCELLANEOUS) ×2 IMPLANT
NS IRRIG 1000ML POUR BTL (IV SOLUTION) ×4 IMPLANT
PACK PERIPHERAL VASCULAR (CUSTOM PROCEDURE TRAY) ×2 IMPLANT
PAD ARMBOARD 7.5X6 YLW CONV (MISCELLANEOUS) ×4 IMPLANT
PENCIL BUTTON HOLSTER BLD 10FT (ELECTRODE) ×2 IMPLANT
SPONGE T-LAP 18X18 ~~LOC~~+RFID (SPONGE) ×2 IMPLANT
SUT MNCRL AB 4-0 PS2 18 (SUTURE) ×4 IMPLANT
SUT PROLENE 5 0 C 1 24 (SUTURE) ×7 IMPLANT
SUT PROLENE 5 0 C 1 36 (SUTURE) IMPLANT
SUT PROLENE 6 0 BV (SUTURE) ×1 IMPLANT
SUT SILK 2 0 PERMA HAND 18 BK (SUTURE) IMPLANT
SUT SILK 2 0 SH (SUTURE) ×1 IMPLANT
SUT SILK 3 0 (SUTURE) ×4
SUT SILK 3-0 18XBRD TIE 12 (SUTURE) IMPLANT
SUT VIC AB 2-0 CT1 27 (SUTURE) ×6
SUT VIC AB 2-0 CT1 TAPERPNT 27 (SUTURE) ×2 IMPLANT
SUT VIC AB 3-0 SH 27 (SUTURE) ×4
SUT VIC AB 3-0 SH 27X BRD (SUTURE) ×2 IMPLANT
TOWEL GREEN STERILE (TOWEL DISPOSABLE) ×2 IMPLANT
TRAY FOLEY MTR SLVR 16FR STAT (SET/KITS/TRAYS/PACK) ×2 IMPLANT
UNDERPAD 30X36 HEAVY ABSORB (UNDERPADS AND DIAPERS) ×2 IMPLANT
WATER STERILE IRR 1000ML POUR (IV SOLUTION) ×2 IMPLANT

## 2021-07-28 NOTE — Progress Notes (Signed)
Pacu RN Report to floor given  Gave report to [employer], [phone]. Discussed surgery, meds given in OR and Pacu, VS, IV fluids given, EBL, urine output, pain and other pertinent information. Also discussed if pt had any family or friends here or belongings with them. Pt on home O2, 4L when sleeping, 3L otherwise.   Pt exits my care.

## 2021-07-28 NOTE — H&P (Signed)
History and Physical Interval Note:  08/05/2021 7:35 AM  Paul [name]  has presented today for surgery, with the diagnosis of Left foot wound, Iliac artery occlusion.  The various methods of treatment have been discussed with the patient and family. After consideration of risks, benefits and other options for treatment, the patient has consented to  Procedure(s): BYPASS GRAFT FEMORAL-FEMORAL ARTERY (Bilateral) as a surgical intervention.  The patient's history has been reviewed, patient examined, no change in status, stable for surgery.  I have reviewed the patient's chart and labs.  Questions were answered to the patient's satisfaction.    Right to left fem fem bypass.  Risk and benefits again discussed.  Paul [name]  Reason for Consult: Evaluate for right to left femorofemoral bypass, left foot wound Referring Physician: Dr. Fletcher Anon MRN #:  [id number]   History of Present Illness: This is a 80 y.o. adult with history of diabetes, hypertension, hyperlipidemia, A. fib on Eliquis, COPD, and tobacco abuse that vascular surgery has been consulted for right to left femorofemoral bypass.  Patient has a wound on his left great toe.  He states this wound has been present for about 3 months.  Initially evaluated by podiatry then referred to Dr. Fletcher Anon.  He has had both retrograde and antegrade attempts from brachial access to cross left common iliac occlusion.  Retrograde attempt was unsuccessful and antegrade attempt from brachial access today the lesion was crossed but could not get any catheter to track.  Patient denies any symptoms in the right leg.  He is ambulatory.  He is still smoking.  Previous abdominal surgery includes cholecystectomy.   ABIs are noncompressible.  Does have a biphasic waveform on the right with a toe pressure of 98.  Left has dampened monophasic flow with a reduced toe pressure of 38       Past Medical History:  Diagnosis Date   Asthma     Atrial fibrillation (HCC)      CAD (coronary artery disease)      Prior PTCA   COPD (chronic obstructive pulmonary disease) (HCC)     Diabetes mellitus     GERD (gastroesophageal reflux disease)     Hard of hearing     Hyperlipidemia     Hypertension     lung ca dx'd 01/2006   Nephrolithiasis     Neuropathy     Osteopenia     Pulmonary embolus (HCC)     Tibia/fibula fracture 07/2016    Left   Tremor, essential             Past Surgical History:  Procedure Laterality Date   ABDOMINAL AORTOGRAM W/LOWER EXTREMITY N/A 07/12/2021    Procedure: ABDOMINAL AORTOGRAM W/LOWER EXTREMITY;  Surgeon: Wellington Hampshire, MD;  Location: [facility];  Service: Cardiovascular;  Laterality: N/A;   Angioplasty   Approx [place]       enchondroma Right 2016    Humerus   FIXATION KYPHOPLASTY       LUNG CANCER SURGERY   01/2006    Small excision of left lung tissue; mesh with radioactive seeds (per pt report)   ORIF TIBIA FRACTURE Left 08/22/2016    Procedure: OPEN REDUCTION INTERNAL FIXATION (ORIF) TIBIA FRACTURE;  Surgeon: Nicholes Stairs, MD;  Location: [facility];  Service: Orthopedics;  Laterality: Left;   PERIPHERAL VASCULAR BALLOON ANGIOPLASTY   07/12/2021    Procedure: PERIPHERAL VASCULAR BALLOON ANGIOPLASTY;  Surgeon: Wellington Hampshire, MD;  Location: [facility];  Service: Cardiovascular;;  aborted PTA of left common iliac   ROTATOR CUFF TEAR   2016   Tibia/Fibula Repair Left 07/2016   TONSILLECTOMY              Allergies  Allergen Reactions   Codeine Anaphylaxis   Phenobarbital Hypertension             Prior to Admission medications   Medication Sig Start Date End Date Taking? Authorizing Provider  acetaminophen (TYLENOL) 500 MG tablet Take 1,000 mg by mouth every 8 (eight) hours as needed for moderate pain.     Yes [provider]  albuterol (VENTOLIN HFA) 108 (90 Base) MCG/ACT inhaler Inhale 2 puffs into the lungs every 2  (two) hours as needed for wheezing or shortness of breath.      Yes [provider]  aspirin EC 81 MG tablet Take 81 mg by mouth daily. Swallow whole.     Yes [provider]  atorvastatin (LIPITOR) 40 MG tablet TAKE 1 TABLET BY MOUTH AT  BEDTIME Patient taking differently: Take 40 mg by mouth daily. 10/06/18   Yes Lelon Perla, MD  calcium carbonate (OS-CAL) 600 MG TABS tablet Take 600 mg by mouth every evening.     Yes [provider]  Cholecalciferol (VITAMIN D3) 20 MCG (800 UNIT) TABS Take 800 Units by mouth daily.     Yes [provider]  empagliflozin (JARDIANCE) 25 MG TABS tablet Take 25 mg by mouth daily.     Yes [provider]  fexofenadine (ALLEGRA) 180 MG tablet Take 180 mg by mouth daily.     Yes [provider]  fluticasone (FLONASE) 50 MCG/ACT nasal spray Place 1 spray into the nose 2 (two) times daily.     Yes [provider]  furosemide (LASIX) 20 MG tablet TAKE 1 TABLET BY MOUTH  DAILY Patient taking differently: Take 20 mg by mouth daily. 12/05/18   Yes Tanda Rockers, MD  gabapentin (NEURONTIN) 300 MG capsule Take 1,200 mg by mouth at bedtime. 4 at bedtime     Yes [provider]  guaiFENesin (MUCINEX) 600 MG 12 hr tablet Take 1,200 mg by mouth daily with breakfast.      Yes [provider]  losartan (COZAAR) 50 MG tablet Take 1 tablet (50 mg total) by mouth daily. Patient taking differently: Take 50 mg by mouth daily with breakfast. 10/21/13   Yes Tanda Rockers, MD  metFORMIN (GLUCOPHAGE) 1000 MG tablet Take 1,000 mg by mouth 2 (two) times daily with a meal.     Yes [provider]  metoCLOPramide (REGLAN) 5 MG tablet Take 5 mg by mouth 3 (three) times daily before meals. 04/01/21   Yes [provider]  Multiple Vitamins-Minerals (CENTRUM SILVER ADULT 50+) TABS Take 1 tablet by mouth daily with breakfast.     Yes [provider]  omeprazole (PRILOSEC OTC) 20 MG  tablet Take 20 mg by mouth daily as needed (heartburn).     Yes [provider]  vitamin C (ASCORBIC ACID) 250 MG tablet Take 250 mg by mouth daily.     Yes [provider]  ELIQUIS 5 MG TABS tablet TAKE 1 TABLET BY MOUTH  TWICE DAILY Patient taking differently: Take 5 mg by mouth 2 (two) times daily. 01/09/21     Deberah Pelton, NP  OXYGEN Inhale 3-4 L into the lungs continuous. 4L of  oxygen at night and 3L of oxygen when walking long distances       [provider]  [facility] injection   01/03/21     [provider]      Social History         Socioeconomic History   Marital status: Married      Spouse name: Paul [name]   Number of children: 2   Years of education: college   Highest education level: Not on file  Occupational History      Employer: RETIRED      Comment: Retired from [employer]  Tobacco Use   Smoking status: Every Day      Packs/day: 0.50      Years: 59.00      Pack years: 29.50      Types: Cigarettes   Smokeless tobacco: Never  Substance and Sexual Activity   Alcohol use: Not Currently      Alcohol/week: 1.0 standard drink      Types: 1 Glasses of wine per week   Drug use: Never   Sexual activity: Never  Other Topics Concern   Not on file  Social History Narrative    Patient lives at home with his wife Paul [name]).    Retired.    Education- College     Right handed.    Caffeine- three cups of coffee daily.    Social Determinants of Health    Financial Resource Strain: Not on file  Food Insecurity: Not on file  Transportation Needs: Not on file  Physical Activity: Not on file  Stress: Not on file  Social Connections: Not on file  Intimate Partner Violence: Not on file             Family History  Problem Relation Age of Onset   Heart disease Mother     Tremor Mother     Dementia Mother     Heart Problems Father        ROS: _0  Positive   _1  Negative   _2  All sytems reviewed and are negative   Cardiovascular: _3   chest pain/pressure _4  palpitations _5  SOB lying flat _6  DOE _7  pain in legs while walking _8  pain in legs at rest _9  pain in legs at night _10  non-healing ulcers _11  hx of DVT _12  swelling in legs   Pulmonary: _13  productive cough _14  asthma/wheezing _15  home O2   Neurologic: _16  weakness in _17  arms _18  legs _19  numbness in _20  arms _21  legs _22  hx of CVA _23  mini stroke _24 difficulty speaking or slurred speech _25  temporary loss of vision in one eye _26  dizziness   Hematologic: _27  hx of cancer _28  bleeding problems _29  problems with blood clotting easily   Endocrine:   _30  diabetes _31  thyroid disease   GI _32  vomiting blood _33  blood in stool   GU: _34  CKD/renal failure _35  HD--_36  M/W/F or _37  T/T/S _38  burning with urination _39  blood in urine   Psychiatric: _40  anxiety _41  depression   Musculoskeletal: _42  arthritis _43  joint pain   Integumentary: _44  rashes _45  ulcers   Constitutional: _46  fever _47  chills     Physical Examination       Vitals:    07/19/21 1305 07/19/21 1310  BP:      Pulse: 78 86  Resp:      SpO2: 100% 92%    Body mass index is 29.94 kg/m.   General:  NAD Gait: Not observed HENT: WNL, normocephalic Pulmonary: normal non-labored  breathing, without Rales, rhonchi,  wheezing Cardiac: regular, without  Murmurs, rubs or gallops Abdomen: soft, NT/ND Vascular Exam/Pulses: Palpable right femoral pulse.  No palpable left femoral pulse. No palpable right pedal pulses or left pedal pulses Left foot wound pictured below Extremities: Left great toe ulcer pictured below Musculoskeletal: no muscle wasting or atrophy         Neurologic: A&O X 3; Appropriate Affect ; SENSATION: normal; MOTOR FUNCTION:  moving all extremities equally. Speech is fluent/normal         CBC Labs (Brief)          Component Value Date/Time    WBC 7.9 07/19/2021 0840    RBC 4.52 07/19/2021 0840    HGB 12.5 (L) 07/19/2021 0840    HGB 13.7 07/04/2021 1212    HGB 14.1  01/13/2014 0922    HCT 40.1 07/19/2021 0840    HCT 42.1 07/04/2021 1212    HCT 42.8 01/13/2014 0922    PLT 262 07/19/2021 0840    PLT 219 07/04/2021 1212    MCV 88.7 07/19/2021 0840    MCV 83 07/04/2021 1212    MCV 83.9 01/13/2014 0922    MCH 27.7 07/19/2021 0840    MCHC 31.2 07/19/2021 0840    RDW 14.9 07/19/2021 0840    RDW 15.2 07/04/2021 1212    RDW 15.3 (H) 01/13/2014 0922    LYMPHSABS 1.4 08/10/2016 0443    LYMPHSABS 1.5 01/13/2014 0922    MONOABS 1.1 (H) 08/10/2016 0443    MONOABS 0.8 01/13/2014 0922    EOSABS 0.3 08/10/2016 0443    EOSABS 0.2 01/13/2014 0922    BASOSABS 0.0 08/10/2016 0443    BASOSABS 0.1 01/13/2014 0922        BMET Labs (Brief)          Component Value Date/Time    NA 136 07/19/2021 0840    NA 146 (H) 07/04/2021 1212    NA 140 01/13/2014 0922    K 4.6 07/19/2021 0840    K 4.6 01/13/2014 0922    CL 106 07/19/2021 0840    CL 105 01/13/2013 0856    CO2 19 (L) 07/19/2021 0840    CO2 26 01/13/2014 0922    GLUCOSE 142 (H) 07/19/2021 0840    GLUCOSE 112 01/13/2014 0922    GLUCOSE 174 (H) 01/13/2013 0856    BUN 18 07/19/2021 0840    BUN 17 07/04/2021 1212    BUN 20.1 01/13/2014 0922    CREATININE 0.79 07/19/2021 0840    CREATININE 0.9 01/13/2014 0922    CALCIUM 9.1 07/19/2021 0840    CALCIUM 9.9 01/13/2014 0922    GFRNONAA >60 07/19/2021 0840    GFRAA 104 01/06/2021 1512        COAGS: Recent Labs       Lab Results  Component Value Date    INR 0.99 08/09/2016    INR 0.99 03/26/2012    INR 0.95 03/25/2012          Non-Invasive Vascular Imaging:       Angiogram imaging reviewed from 07/12/2021 and 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  The left common femoral profunda as well as SFA popliteal are all patent with apparent three-vessel runoff that was evaluated to just below the knee.  Inflow on the right looks good with a patent infrarenal aorta and patent right iliacs.  He does have  some moderate common femoral disease on the right.   ASSESSMENT/PLAN: This  is a 80 y.o. adult with multiple medical comorbidities that vascular surgery has been consulted for a femoral-femoral bypass.  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  He has had attempts at both retrograde crossing of the CTO from transfemoral access as well as antegrade from brachial approach by Dr. Fletcher Anon that were unsuccessful.  I agree that the next best option would be a femoral-femoral bypass.  We discussed this would be taking inflow from the right leg to provide improved blood flow to the left leg.  I discussed steps of the surgery with the patient and his wife.  We will get him scheduled for next Friday, 07/31/2021.  He will need to hold his Eliquis prior to surgery.  Discussed with Dr. Fletcher Anon of cardiology.   Paul Heck, MD [facility] Office: [place]

## 2021-07-28 NOTE — Progress Notes (Signed)
PA paged to inform of hematomas above bilateral incision sites.

## 2021-07-28 NOTE — Anesthesia Postprocedure Evaluation (Signed)
Anesthesia Post Note  Patient: [name]  Procedure(s) Performed: RIGHT TO LEFT BYPASS GRAFT FEMORAL-FEMORAL ARTERY WITH 8MM DACARON GRAFT (Bilateral: Groin)     Patient location during evaluation: PACU Anesthesia Type: General Level of consciousness: awake and alert Pain management: pain level controlled Vital Signs Assessment: post-procedure vital signs reviewed and stable Respiratory status: spontaneous breathing, nonlabored ventilation, respiratory function stable and patient connected to nasal cannula oxygen Cardiovascular status: blood pressure returned to baseline and stable Postop Assessment: no apparent nausea or vomiting Anesthetic complications: no   No notable events documented.  Last Vitals:  Vitals:   07/25/2021 1055 07/30/2021 1220  BP:  115/62  Pulse: 69 79  Resp: (!) 31 20  Temp:    SpO2: 100% (!) 89%    Last Pain:  Vitals:   08/08/2021 0619  TempSrc:   PainSc: 0-No pain                 Johanna Stafford S

## 2021-07-28 NOTE — Op Note (Signed)
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 Marty Heck, MD  Assistant: Dr. Annamarie Major, MD and Risa Grill, PA  Indications: Patient is an 80 year old male evaluated by Dr. Fletcher Anon and previously underwent attempts at left iliac intervention for a left toe wound.  He attempted both retrograde intervention from transfemoral access as well as antegrade intervention from the brachial artery that were unsuccessful.  Vascular surgery was consulted for femoral femoral bypass.  He presents today after risk benefits discussed.  Findings: Patient has a very large pannus that had to be taped up.  Bilateral groins incisions were open with horizontal groin incisions above the inguinal crease.  The right common femoral had a good pulse and was the inflow.  An 8 mm dacron graft was tunneled above the fascia with a curved tunneler from common femoral to common femoral groin exposure making sure to stay above the fascia and above the pubis.  End-to-side anastomosis was sewn ton both common femoral arteries with 5-0 Prolene parachute technique.  Brisk Doppler signals at completion.  An assistant was needed for exposure and to expedite the case.  Anesthesia: General  Details: Patient was taken to the operating room after informed consent was obtained.  Placed on the operating table supine position.  His abdominal wall had to be taped up due to a fairly large pannus.  The abdominal wall and both groins were then prepped and draped in usual sterile fashion.  Timeout was performed and antibiotics were given.  I initially started in the right groin while Dr. Trula Slade started in the left groin and horizontal groin incisions were made above the inguinal crease in both groins.   Dissected down through subcutaneous tissue opened with Bovie cautery and you cerebellar retractors for added visualization.  The femoral sheath was opened longitudinally.  The common femoral as well as SFA profunda as well as the distal external iliac were dissected out and all branches were controlled with Vesseloops.  I then created blunt tunnels on the lower abdomen over the fascia bluntly with my fingers and then passes a curved tunneled.  We tunneled from one common femoral artery exposure to the other.  We then passed an 8 mm dacron graft.  The graft was then straightened to the appropriate length to ensure we had adequate length of graft.  There was a nice curve in the graft to ensure no kinking.  Patient was given 100 units/kg of IV heparin.  We then proceeded to clamp both common femoral arteries at the same time using Henley clamps on the distal external iliac while Vesseloops were used on SFA profunda and all branches.  Both common femorals were opened with 11 blade scalpel extended with Potts scissors.  Both ends of the graft were beveled.  I sewed in the right groin and Dr. Trula Slade sewed in the left groin using a 5-0 Prolene parachute technique and end-to-side anastomosis was done to both common femoral arteries with the dacron graft.  We had excellent pulsatile inflow on the right which was the donor artery.  This was flushed and de-aired prior to completion.  We then flushed the left groin prior to completion of that anastomosis.  There was a palpable pulse in the graft.  There was brisk Doppler flow in both  feet.  Protamine was given for reversal.  Both groins were irrigated out and closed with 2-0 Vicryl, 3-0 Vicryl, 4-0 Monocryl and Dermabond.  Complication: None  Condition: Stable  Marty Heck, MD [facility] Office: [place]

## 2021-07-28 NOTE — Progress Notes (Signed)
Dr. Virl Cagey assessed the patients bruised areas and pulses at bedside.  Will recheck in the am.  Patient is to remain on bedrest tonight.  No other orders.

## 2021-07-28 NOTE — Transfer of Care (Signed)
Immediate Anesthesia Transfer of Care Note  Patient: Paul [name]  Procedure(s) Performed: RIGHT TO LEFT BYPASS GRAFT FEMORAL-FEMORAL ARTERY WITH 8MM DACARON GRAFT (Bilateral: Groin)  Patient Location: PACU  Anesthesia Type:General  Level of Consciousness: drowsy and patient cooperative  Airway & Oxygen Therapy: Patient Spontanous Breathing and Patient connected to face mask oxygen  Post-op Assessment: Report given to RN, Post -op Vital signs reviewed and stable and Patient moving all extremities X 4  Post vital signs: Reviewed and stable  Last Vitals:  Vitals Value Taken Time  BP 157/77 07/13/2021 1031  Temp    Pulse 64 07/17/2021 1032  Resp 20 07/26/2021 1032  SpO2 98 % 08/09/2021 1032  Vitals shown include unvalidated device data.  Last Pain:  Vitals:   07/20/2021 0619  TempSrc:   PainSc: 0-No pain         Complications: No notable events documented.

## 2021-07-28 NOTE — Anesthesia Procedure Notes (Signed)
Procedure Name: Intubation Date/Time: 08/06/2021 7:49 AM Performed by: Michele Rockers, CRNA Pre-anesthesia Checklist: Patient identified, Patient being monitored, Timeout performed, Emergency Drugs available and Suction available Patient Re-evaluated:Patient Re-evaluated prior to induction Oxygen Delivery Method: Circle System Utilized Preoxygenation: Pre-oxygenation with 100% oxygen Induction Type: IV induction Ventilation: Mask ventilation without difficulty Laryngoscope Size: Miller and 2 Grade View: Grade I Tube type: Oral Tube size: 7.5 mm Number of attempts: 1 Airway Equipment and Method: Stylet Placement Confirmation: ETT inserted through vocal cords under direct vision, positive ETCO2 and breath sounds checked- equal and bilateral Secured at: 21 cm Tube secured with: Tape Dental Injury: Teeth and Oropharynx as per pre-operative assessment

## 2021-07-28 NOTE — Progress Notes (Signed)
Physician office called to make aware of bilateral hematomas above incision sites.

## 2021-07-29 ENCOUNTER — Inpatient Hospital Stay (HOSPITAL_COMMUNITY): Payer: Medicare Other | Admitting: Certified Registered"

## 2021-07-29 ENCOUNTER — Inpatient Hospital Stay (HOSPITAL_COMMUNITY): Payer: Medicare Other

## 2021-07-29 ENCOUNTER — Encounter (HOSPITAL_COMMUNITY): Admission: RE | Disposition: E | Payer: Self-pay | Source: Home / Self Care | Attending: Vascular Surgery

## 2021-07-29 DIAGNOSIS — L8990 Pressure ulcer of unspecified site, unspecified stage: Secondary | ICD-10-CM | POA: Insufficient documentation

## 2021-07-29 DIAGNOSIS — G9341 Metabolic encephalopathy: Secondary | ICD-10-CM | POA: Diagnosis not present

## 2021-07-29 DIAGNOSIS — T8132XA Disruption of internal operation (surgical) wound, not elsewhere classified, initial encounter: Secondary | ICD-10-CM

## 2021-07-29 DIAGNOSIS — T82868A Thrombosis of vascular prosthetic devices, implants and grafts, initial encounter: Secondary | ICD-10-CM

## 2021-07-29 DIAGNOSIS — Z9889 Other specified postprocedural states: Secondary | ICD-10-CM

## 2021-07-29 DIAGNOSIS — Z66 Do not resuscitate: Secondary | ICD-10-CM | POA: Diagnosis not present

## 2021-07-29 DIAGNOSIS — I97638 Postprocedural hematoma of a circulatory system organ or structure following other circulatory system procedure: Secondary | ICD-10-CM | POA: Diagnosis not present

## 2021-07-29 DIAGNOSIS — Z515 Encounter for palliative care: Secondary | ICD-10-CM | POA: Diagnosis not present

## 2021-07-29 DIAGNOSIS — E1151 Type 2 diabetes mellitus with diabetic peripheral angiopathy without gangrene: Secondary | ICD-10-CM | POA: Diagnosis not present

## 2021-07-29 HISTORY — PX: APPLICATION OF WOUND VAC: SHX5189

## 2021-07-29 HISTORY — PX: FEMORAL ARTERY EXPLORATION: SHX5160

## 2021-07-29 HISTORY — PX: INTRAOPERATIVE ARTERIOGRAM: SHX5157

## 2021-07-29 HISTORY — PX: THROMBECTOMY ILIAC ARTERY: SHX6405

## 2021-07-29 LAB — BASIC METABOLIC PANEL
Anion gap: 12 (ref 5–15)
Anion gap: 7 (ref 5–15)
Anion gap: 8 (ref 5–15)
Anion gap: 8 (ref 5–15)
BUN: 19 mg/dL (ref 8–23)
BUN: 18 mg/dL (ref 8–23)
BUN: 18 mg/dL (ref 8–23)
BUN: 18 mg/dL (ref 8–23)
CO2: 17 mmol/L — ABNORMAL LOW (ref 22–32)
CO2: 22 mmol/L (ref 22–32)
CO2: 22 mmol/L (ref 22–32)
CO2: 22 mmol/L (ref 22–32)
Calcium: 7.7 mg/dL — ABNORMAL LOW (ref 8.9–10.3)
Calcium: 7.7 mg/dL — ABNORMAL LOW (ref 8.9–10.3)
Calcium: 7.7 mg/dL — ABNORMAL LOW (ref 8.9–10.3)
Calcium: 7.7 mg/dL — ABNORMAL LOW (ref 8.9–10.3)
Chloride: 105 mmol/L (ref 98–111)
Chloride: 105 mmol/L (ref 98–111)
Chloride: 104 mmol/L (ref 98–111)
Chloride: 104 mmol/L (ref 98–111)
Creatinine, Ser: 0.91 mg/dL (ref 0.61–1.24)
Creatinine, Ser: 0.77 mg/dL (ref 0.61–1.24)
Creatinine, Ser: 0.79 mg/dL (ref 0.61–1.24)
Creatinine, Ser: 0.82 mg/dL (ref 0.61–1.24)
GFR, Estimated: >60 mL/min (ref >60)
GFR, Estimated: >60 mL/min (ref >60)
GFR, Estimated: >60 mL/min (ref >60)
GFR, Estimated: >60 mL/min (ref >60)
Glucose, Bld: 204 mg/dL — ABNORMAL HIGH (ref 70–99)
Glucose, Bld: 202 mg/dL — ABNORMAL HIGH (ref 70–99)
Glucose, Bld: 138 mg/dL — ABNORMAL HIGH (ref 70–99)
Glucose, Bld: 177 mg/dL — ABNORMAL HIGH (ref 70–99)
Potassium: 4.8 mmol/L (ref 3.5–5.1)
Potassium: 4.1 mmol/L (ref 3.5–5.1)
Potassium: 4 mmol/L (ref 3.5–5.1)
Potassium: 3.8 mmol/L (ref 3.5–5.1)
Sodium: 134 mmol/L — ABNORMAL LOW (ref 135–145)
Sodium: 134 mmol/L — ABNORMAL LOW (ref 135–145)
Sodium: 134 mmol/L — ABNORMAL LOW (ref 135–145)
Sodium: 134 mmol/L — ABNORMAL LOW (ref 135–145)

## 2021-07-29 LAB — CBC
HCT: 26.1 % — ABNORMAL LOW (ref 39.0–52.0)
HCT: 22.7 % — ABNORMAL LOW (ref 39.0–52.0)
HCT: 25.1 % — ABNORMAL LOW (ref 39.0–52.0)
Hemoglobin: 8.7 g/dL — ABNORMAL LOW (ref 13.0–17.0)
Hemoglobin: 7.1 g/dL — ABNORMAL LOW (ref 13.0–17.0)
Hemoglobin: 8.5 g/dL — ABNORMAL LOW (ref 13.0–17.0)
MCH: 28.4 pg (ref 26.0–34.0)
MCH: 28.1 pg (ref 26.0–34.0)
MCH: 29.1 pg (ref 26.0–34.0)
MCHC: 33.3 g/dL (ref 30.0–36.0)
MCHC: 31.3 g/dL (ref 30.0–36.0)
MCHC: 33.9 g/dL (ref 30.0–36.0)
MCV: 85.3 fL (ref 80.0–100.0)
MCV: 89.7 fL (ref 80.0–100.0)
MCV: 86 fL (ref 80.0–100.0)
Platelets: 168 10*3/uL (ref 150–400)
Platelets: 161 10*3/uL (ref 150–400)
Platelets: 140 10*3/uL — ABNORMAL LOW (ref 150–400)
RBC: 3.06 MIL/uL — ABNORMAL LOW (ref 4.22–5.81)
RBC: 2.53 MIL/uL — ABNORMAL LOW (ref 4.22–5.81)
RBC: 2.92 MIL/uL — ABNORMAL LOW (ref 4.22–5.81)
RDW: 15.9 % — ABNORMAL HIGH (ref 11.5–15.5)
RDW: 15.4 % (ref 11.5–15.5)
RDW: 16.2 % — ABNORMAL HIGH (ref 11.5–15.5)
WBC: 11.8 10*3/uL — ABNORMAL HIGH (ref 4.0–10.5)
WBC: 15.5 10*3/uL — ABNORMAL HIGH (ref 4.0–10.5)
WBC: 12.2 10*3/uL — ABNORMAL HIGH (ref 4.0–10.5)
nRBC: 0 % (ref 0.0–0.2)
nRBC: 9.4 % — ABNORMAL HIGH (ref 0.0–0.2)
nRBC: 0 % (ref 0.0–0.2)

## 2021-07-29 LAB — POCT I-STAT 7, (LYTES, BLD GAS, ICA,H+H)
Acid-base deficit: 7 mmol/L — ABNORMAL HIGH (ref 0.0–2.0)
Acid-base deficit: 8 mmol/L — ABNORMAL HIGH (ref 0.0–2.0)
Acid-base deficit: 4 mmol/L — ABNORMAL HIGH (ref 0.0–2.0)
Bicarbonate: 19.6 mmol/L — ABNORMAL LOW (ref 20.0–28.0)
Bicarbonate: 20 mmol/L (ref 20.0–28.0)
Bicarbonate: 22.4 mmol/L (ref 20.0–28.0)
Calcium, Ion: 1.08 mmol/L — ABNORMAL LOW (ref 1.15–1.40)
Calcium, Ion: 1.15 mmol/L (ref 1.15–1.40)
Calcium, Ion: 1.05 mmol/L — ABNORMAL LOW (ref 1.15–1.40)
HCT: 18 % — ABNORMAL LOW (ref 39.0–52.0)
HCT: 25 % — ABNORMAL LOW (ref 39.0–52.0)
HCT: 25 % — ABNORMAL LOW (ref 39.0–52.0)
Hemoglobin: 6.1 g/dL — CL (ref 13.0–17.0)
Hemoglobin: 8.5 g/dL — ABNORMAL LOW (ref 13.0–17.0)
Hemoglobin: 8.5 g/dL — ABNORMAL LOW (ref 13.0–17.0)
O2 Saturation: 100 %
O2 Saturation: 99 %
O2 Saturation: 100 %
Patient temperature: 36.8
Potassium: 5 mmol/L (ref 3.5–5.1)
Potassium: 5.2 mmol/L — ABNORMAL HIGH (ref 3.5–5.1)
Potassium: 4.5 mmol/L (ref 3.5–5.1)
Sodium: 134 mmol/L — ABNORMAL LOW (ref 135–145)
Sodium: 135 mmol/L (ref 135–145)
Sodium: 137 mmol/L (ref 135–145)
TCO2: 21 mmol/L — ABNORMAL LOW (ref 22–32)
TCO2: 21 mmol/L — ABNORMAL LOW (ref 22–32)
TCO2: 24 mmol/L (ref 22–32)
pCO2 arterial: 45.1 mmHg (ref 32.0–48.0)
pCO2 arterial: 50.5 mmHg — ABNORMAL HIGH (ref 32.0–48.0)
pCO2 arterial: 48.1 mmHg — ABNORMAL HIGH (ref 32.0–48.0)
pH, Arterial: 7.196 — CL (ref 7.350–7.450)
pH, Arterial: 7.255 — ABNORMAL LOW (ref 7.350–7.450)
pH, Arterial: 7.276 — ABNORMAL LOW (ref 7.350–7.450)
pO2, Arterial: 198 mmHg — ABNORMAL HIGH (ref 83.0–108.0)
pO2, Arterial: 219 mmHg — ABNORMAL HIGH (ref 83.0–108.0)
pO2, Arterial: 209 mmHg — ABNORMAL HIGH (ref 83.0–108.0)

## 2021-07-29 LAB — GLUCOSE, CAPILLARY
Glucose-Capillary: 185 mg/dL — ABNORMAL HIGH (ref 70–99)
Glucose-Capillary: 231 mg/dL — ABNORMAL HIGH (ref 70–99)
Glucose-Capillary: 172 mg/dL — ABNORMAL HIGH (ref 70–99)
Glucose-Capillary: 136 mg/dL — ABNORMAL HIGH (ref 70–99)
Glucose-Capillary: 161 mg/dL — ABNORMAL HIGH (ref 70–99)

## 2021-07-29 LAB — LIPID PANEL
Cholesterol: 66 mg/dL (ref 0–200)
HDL: 35 mg/dL — ABNORMAL LOW (ref >40)
LDL Cholesterol: 22 mg/dL (ref 0–99)
Total CHOL/HDL Ratio: 1.9 RATIO
Triglycerides: 45 mg/dL (ref <150)
VLDL: 9 mg/dL (ref 0–40)

## 2021-07-29 LAB — PREPARE RBC (CROSSMATCH)

## 2021-07-29 LAB — POCT ACTIVATED CLOTTING TIME
Activated Clotting Time: 127 seconds
Activated Clotting Time: 214 seconds

## 2021-07-29 SURGERY — INTRA OPERATIVE ARTERIOGRAM
Anesthesia: General | Site: Groin | Laterality: Right

## 2021-07-29 MED ORDER — PROPOFOL 10 MG/ML IV BOLUS
INTRAVENOUS | Status: DC | PRN
  Administered 2021-07-29: 90 mg via INTRAVENOUS

## 2021-07-29 MED ORDER — CHLORHEXIDINE GLUCONATE CLOTH 2 % EX PADS
6.0000 | MEDICATED_PAD | Freq: Every day | CUTANEOUS | Status: DC
  Administered 2021-07-29 – 2021-08-07 (×11): 6 via TOPICAL

## 2021-07-29 MED ORDER — HEPARIN SODIUM (PORCINE) 1000 UNIT/ML IJ SOLN
INTRAMUSCULAR | Status: DC | PRN
  Administered 2021-07-29: 5000 [IU] via INTRAVENOUS
  Administered 2021-07-29 (×2): 3000 [IU] via INTRAVENOUS

## 2021-07-29 MED ORDER — IODIXANOL 320 MG/ML IV SOLN
INTRAVENOUS | Status: DC | PRN
Start: 2021-07-29 — End: 2021-07-29
  Administered 2021-07-29: 50 mL

## 2021-07-29 MED ORDER — CEFAZOLIN SODIUM-DEXTROSE 2-3 GM-%(50ML) IV SOLR
INTRAVENOUS | Status: DC | PRN
  Administered 2021-07-29: 2 g via INTRAVENOUS

## 2021-07-29 MED ORDER — HEPARIN 6000 UNIT IRRIGATION SOLUTION
Status: AC
  Filled 2021-07-29: qty 500

## 2021-07-29 MED ORDER — PHENYLEPHRINE HCL-NACL 20-0.9 MG/250ML-% IV SOLN
INTRAVENOUS | Status: DC | PRN
  Administered 2021-07-29: 50 ug/min via INTRAVENOUS

## 2021-07-29 MED ORDER — ARTIFICIAL TEARS OPHTHALMIC OINT
TOPICAL_OINTMENT | OPHTHALMIC | Status: DC | PRN
  Administered 2021-07-29: 1 via OPHTHALMIC
  Administered 2021-07-29: 50 via OPHTHALMIC

## 2021-07-29 MED ORDER — IOHEXOL 350 MG/ML SOLN
100.0000 mL | Freq: Once | INTRAVENOUS | Status: AC | PRN
  Administered 2021-07-29: 100 mL via INTRAVENOUS

## 2021-07-29 MED ORDER — FENTANYL CITRATE (PF) 250 MCG/5ML IJ SOLN
INTRAMUSCULAR | Status: DC | PRN
  Administered 2021-07-29: 50 ug via INTRAVENOUS

## 2021-07-29 MED ORDER — 0.9 % SODIUM CHLORIDE (POUR BTL) OPTIME
TOPICAL | Status: DC | PRN
  Administered 2021-07-29: 2000 mL

## 2021-07-29 MED ORDER — FENTANYL CITRATE (PF) 100 MCG/2ML IJ SOLN: 25.0000 ug | INTRAMUSCULAR | Status: DC | PRN

## 2021-07-29 MED ORDER — PHENYLEPHRINE 40 MCG/ML (10ML) SYRINGE FOR IV PUSH (FOR BLOOD PRESSURE SUPPORT)
PREFILLED_SYRINGE | INTRAVENOUS | Status: DC | PRN
  Administered 2021-07-29: 160 ug via INTRAVENOUS
  Administered 2021-07-29: 120 ug via INTRAVENOUS

## 2021-07-29 MED ORDER — LACTATED RINGERS IV SOLN: INTRAVENOUS | Status: DC | PRN

## 2021-07-29 MED ORDER — ROCURONIUM BROMIDE 10 MG/ML (PF) SYRINGE
PREFILLED_SYRINGE | INTRAVENOUS | Status: DC | PRN
  Administered 2021-07-29: 50 mg via INTRAVENOUS

## 2021-07-29 MED ORDER — ONDANSETRON HCL 4 MG/2ML IJ SOLN
INTRAMUSCULAR | Status: DC | PRN
  Administered 2021-07-29: 4 mg via INTRAVENOUS

## 2021-07-29 MED ORDER — SUGAMMADEX SODIUM 200 MG/2ML IV SOLN
INTRAVENOUS | Status: DC | PRN
  Administered 2021-07-29: 200 mg via INTRAVENOUS

## 2021-07-29 MED ORDER — SODIUM CHLORIDE 0.9 % IV SOLN: Freq: Once | INTRAVENOUS | Status: AC

## 2021-07-29 MED ORDER — SODIUM BICARBONATE 8.4 % IV SOLN
INTRAVENOUS | Status: DC | PRN
  Administered 2021-07-29: 50 meq via INTRAVENOUS

## 2021-07-29 MED ORDER — HEPARIN 6000 UNIT IRRIGATION SOLUTION
Status: DC | PRN
  Administered 2021-07-29: 1

## 2021-07-29 MED ORDER — FENTANYL CITRATE (PF) 250 MCG/5ML IJ SOLN
INTRAMUSCULAR | Status: AC
  Filled 2021-07-29: qty 5

## 2021-07-29 MED ORDER — SODIUM CHLORIDE 0.9% IV SOLUTION: Freq: Once | INTRAVENOUS | Status: DC

## 2021-07-29 MED ORDER — PROPOFOL 10 MG/ML IV BOLUS
INTRAVENOUS | Status: AC
  Filled 2021-07-29: qty 20

## 2021-07-29 MED ORDER — SUCCINYLCHOLINE CHLORIDE 200 MG/10ML IV SOSY
PREFILLED_SYRINGE | INTRAVENOUS | Status: DC | PRN
  Administered 2021-07-29: 120 mg via INTRAVENOUS

## 2021-07-29 MED ORDER — PROTAMINE SULFATE 10 MG/ML IV SOLN
INTRAVENOUS | Status: DC | PRN
  Administered 2021-07-29: 50 mg via INTRAVENOUS

## 2021-07-29 MED ORDER — ONDANSETRON HCL 4 MG/2ML IJ SOLN: 4.0000 mg | Freq: Once | INTRAMUSCULAR | Status: DC | PRN

## 2021-07-29 MED ORDER — SODIUM CHLORIDE 0.9 % IV SOLN: INTRAVENOUS | Status: DC | PRN

## 2021-07-29 MED ORDER — DEXAMETHASONE SODIUM PHOSPHATE 10 MG/ML IJ SOLN
INTRAMUSCULAR | Status: DC | PRN
  Administered 2021-07-29: 5 mg via INTRAVENOUS

## 2021-07-29 MED ORDER — LIDOCAINE 2% (20 MG/ML) 5 ML SYRINGE
INTRAMUSCULAR | Status: DC | PRN
  Administered 2021-07-29: 80 mg via INTRAVENOUS

## 2021-07-29 MED ORDER — HEPARIN (PORCINE) 25000 UT/250ML-% IV SOLN
1650.0000 [IU]/h | INTRAVENOUS | Status: DC
  Administered 2021-07-29: 1100 [IU]/h via INTRAVENOUS
  Administered 2021-07-30: 1450 [IU]/h via INTRAVENOUS
  Administered 2021-07-31 – 2021-08-01 (×2): 1650 [IU]/h via INTRAVENOUS
  Filled 2021-07-29 (×4): qty 250

## 2021-07-29 SURGICAL SUPPLY — 49 items
BAG COUNTER SPONGE SURGICOUNT (BAG) ×4 IMPLANT
BAG SPNG CNTER NS LX DISP (BAG) ×3
BAG SURGICOUNT SPONGE COUNTING (BAG) ×1
CANISTER SUCT 3000ML PPV (MISCELLANEOUS) ×5 IMPLANT
CANISTER WOUND CARE 500ML ATS (WOUND CARE) ×3 IMPLANT
CATH EMB 3FR 80CM (CATHETERS) ×4 IMPLANT
CATH EMB 4FR 80CM (CATHETERS) ×3 IMPLANT
CONNECTOR Y ATS VAC SYSTEM (MISCELLANEOUS) ×4 IMPLANT
COVER SURGICAL LIGHT HANDLE (MISCELLANEOUS) ×5 IMPLANT
DRESSING PEEL AND PLAC PRVNA20 (GAUZE/BANDAGES/DRESSINGS) ×1 IMPLANT
DRESSING PEEL AND PLC PRVNA 13 (GAUZE/BANDAGES/DRESSINGS) ×1 IMPLANT
DRSG PEEL AND PLACE PREVENA 13 (GAUZE/BANDAGES/DRESSINGS) ×5
DRSG PEEL AND PLACE PREVENA 20 (GAUZE/BANDAGES/DRESSINGS) ×5
ELECT REM PT RETURN 9FT ADLT (ELECTROSURGICAL) ×5
ELECTRODE REM PT RTRN 9FT ADLT (ELECTROSURGICAL) ×3 IMPLANT
GAUZE 4X4 16PLY ~~LOC~~+RFID DBL (SPONGE) ×4 IMPLANT
GLOVE SRG 8 PF TXTR STRL LF DI (GLOVE) ×7 IMPLANT
GLOVE SURG POLYISO LF SZ6.5 (GLOVE) ×3 IMPLANT
GLOVE SURG POLYISO LF SZ8 (GLOVE) IMPLANT
GLOVE SURG SYN 7.5  E (GLOVE) ×5
GLOVE SURG SYN 7.5 E (GLOVE) ×3 IMPLANT
GLOVE SURG SYN 7.5 PF PI (GLOVE) IMPLANT
GLOVE SURG UNDER POLY LF SZ8 (GLOVE) ×15
GOWN STRL REUS W/ TWL LRG LVL3 (GOWN DISPOSABLE) ×9 IMPLANT
GOWN STRL REUS W/TWL 2XL LVL3 (GOWN DISPOSABLE) ×5 IMPLANT
GOWN STRL REUS W/TWL LRG LVL3 (GOWN DISPOSABLE) ×15
KIT BASIN OR (CUSTOM PROCEDURE TRAY) ×5 IMPLANT
KIT MICROPUNCTURE NIT STIFF (SHEATH) ×4 IMPLANT
KIT TURNOVER KIT B (KITS) ×5 IMPLANT
NS IRRIG 1000ML POUR BTL (IV SOLUTION) ×10 IMPLANT
PACK GENERAL/GYN (CUSTOM PROCEDURE TRAY) ×2 IMPLANT
PACK PERIPHERAL VASCULAR (CUSTOM PROCEDURE TRAY) ×3 IMPLANT
PAD ARMBOARD 7.5X6 YLW CONV (MISCELLANEOUS) ×10 IMPLANT
SET COLLECT BLD 21X3/4 12 (NEEDLE) ×3 IMPLANT
SPONGE T-LAP 18X18 ~~LOC~~+RFID (SPONGE) ×9 IMPLANT
STAPLER VISISTAT 35W (STAPLE) ×3 IMPLANT
STOPCOCK 4 WAY LG BORE MALE ST (IV SETS) ×3 IMPLANT
SUT MNCRL AB 4-0 PS2 18 (SUTURE) ×6 IMPLANT
SUT PROLENE 5 0 C 1 24 (SUTURE) ×12 IMPLANT
SUT PROLENE 6 0 BV (SUTURE) ×6 IMPLANT
SUT VIC AB 2-0 CT1 27 (SUTURE) ×25
SUT VIC AB 2-0 CT1 TAPERPNT 27 (SUTURE) ×5 IMPLANT
SUT VIC AB 3-0 SH 27 (SUTURE) ×10
SUT VIC AB 3-0 SH 27X BRD (SUTURE) ×2 IMPLANT
SYR 30ML SLIP (SYRINGE) ×3 IMPLANT
TAPE STRIPS DRAPE STRL (GAUZE/BANDAGES/DRESSINGS) ×4 IMPLANT
TOWEL GREEN STERILE (TOWEL DISPOSABLE) ×5 IMPLANT
TUBING EXTENTION W/L.L. (IV SETS) ×3 IMPLANT
WATER STERILE IRR 1000ML POUR (IV SOLUTION) ×10 IMPLANT

## 2021-07-29 NOTE — Progress Notes (Addendum)
  Daily Progress Note  S/p: 07/25/2021 right to left femoral-femoral bypass  Patient was seen earlier this evening and was noted to have a small hematoma in the right groin.  This was non-pulsatile and appeared focal so I elected to treat conservatively with bedrest. The patient had excellent signals at that time.  I was called this evening after expansion was noted by the nursing staff.  A CT was ordered stat and I met the patient in the CT scanner on my arrival.  On physical exam, the hematoma had expanded significantly.  CT angiography demonstrated a large pseudoaneurysm with arterial bleeding likely from the right-sided anastomosis. The bypass does not fill with contrast.   The patient was awake, alert, oriented.  Mildly tachycardic in atrial fibrillation which is chronic. Blood pressure was normotensive Signals were not checked as the patient was being transferred to the bed status post CT scan.   ASSESSMENT/PLAN: I discussed the physical exam and CT findings with Paul [name]. I told him that this is a surgical emergency.  After discussing the risks and benefits, he was immediately booked for bypass revision with pseudoaneurysm exclusion. I discussed the above with his wife and booked the patient for immediate surgery.  Appreciate excellent care from nursing staff   Cassandria Santee MD MS [facility] [phone] 07/16/2021  12:46 AM

## 2021-07-29 NOTE — Transfer of Care (Signed)
Immediate Anesthesia Transfer of Care Note  Patient: Paul [name]  Procedure(s) Performed: INTRA OPERATIVE ARTERIOGRAM WITH BYPASS ANGIOGRAPHY (Right: Groin) THROMBECTOMY BILATERAL ILIAC ARTERY (Bilateral: Groin) APPLICATION OF WOUND VAC BILATERAL GROINS (Bilateral: Groin) BILATERAL FEMORAL ARTERY EXPLORATION WITH BYPASS REVISION (Bilateral: Groin)  Patient Location: PACU  Anesthesia Type:General  Level of Consciousness: drowsy and confused  Airway & Oxygen Therapy: Patient Spontanous Breathing and Patient connected to nasal cannula oxygen  Post-op Assessment: Report given to RN, Post -op Vital signs reviewed and stable and Patient moving all extremities  Post vital signs: Reviewed and stable  Last Vitals:  Vitals Value Taken Time  BP 151/72 07/30/2021 0412  Temp    Pulse    Resp 18 08/04/2021 0416  SpO2    Vitals shown include unvalidated device data.  Last Pain:  Vitals:   08/10/2021 0050  TempSrc: Oral  PainSc: 8       Patients Stated Pain Goal: 3 (77/41/42 3953)  Complications: No notable events documented.

## 2021-07-29 NOTE — Plan of Care (Signed)

## 2021-07-29 NOTE — Anesthesia Procedure Notes (Addendum)
Arterial Line Insertion Start/End9/17/2022 1:20 AM, 07/30/2021 1:35 AM Performed by: Catalina Gravel, MD, anesthesiologist  Patient location: OR. Preanesthetic checklist: IV checked and monitors and equipment checked Left, radial was placed Catheter size: 20 G Hand hygiene performed   Attempts: 2 Procedure performed using ultrasound guided technique. Ultrasound Notes:anatomy identified, needle tip was noted to be adjacent to the nerve/plexus identified and no ultrasound evidence of intravascular and/or intraneural injection Following insertion, dressing applied and Biopatch. Post procedure assessment: normal  Patient tolerated the procedure well with no immediate complications.

## 2021-07-29 NOTE — Progress Notes (Signed)
PHARMACIST LIPID MONITORING   Paul [name] is a 80 y.o. adult admitted on 07/19/2021 with fem-pop bypass.  Pharmacy has been consulted to optimize lipid-lowering therapy with the indication of secondary prevention for clinical ASCVD.  Recent Labs:  Lipid Panel (last 6 months):   Lab Results  Component Value Date   CHOL 66 08/11/2021   TRIG 45 07/24/2021   HDL 35 (L) 07/13/2021   CHOLHDL 1.9 07/18/2021   VLDL 9 07/20/2021   LDLCALC 22 07/27/2021    Hepatic function panel (last 6 months):   Lab Results  Component Value Date   AST 16 07/20/2021   ALT 15 07/20/2021   ALKPHOS 47 07/20/2021   BILITOT 0.6 07/20/2021    SCr (since admission):   Serum creatinine: 0.77 mg/dL 08/09/2021 0800 Estimated creatinine clearance: 63.5 mL/min (Male) Estimated creatinine clearance: 77.4 mL/min (Male)  Current therapy and lipid therapy tolerance Current lipid-lowering therapy: atorvastatin 78m Previous lipid-lowering therapies (if applicable): n/a Documented or reported allergies or intolerances to lipid-lowering therapies (if applicable): n/a  Assessment:   Patient is excluded from the protocol due to LDL 22 (ESRD, elevated LFTs, pregnancy/breastfeeding, active liver disease)  Plan:    1.Statin intensity (high intensity recommended for all patients regardless of the LDL):  No statin changes. The patient is already on a high intensity statin.  2.Add ezetimibe (if any one of the following):   Not indicated at this time.  3.Refer to lipid clinic:   No  4.Follow-up with:  Primary care provider - WJonathon Jordan MD  5.Follow-up labs after discharge:  No changes in lipid therapy, repeat a lipid panel in one year.      MArrie Senate PharmD, BCPS, BFront Range Endoscopy Centers LLCClinical Pharmacist [phone] check AMION for all MHornbeaknumbers 07/14/2021

## 2021-07-29 NOTE — Anesthesia Preprocedure Evaluation (Signed)
Anesthesia Evaluation  Patient identified by MRN, date of birth, ID band Patient awake    Reviewed: Allergy & Precautions, H&P , NPO status , Patient's Chart, lab work & pertinent test results  Airway Mallampati: II   Neck ROM: full    Dental  (+) Teeth Intact   Pulmonary asthma , COPD,  oxygen dependent, Current Smoker and Patient abstained from smoking.,    breath sounds clear to auscultation       Cardiovascular hypertension, Pt. on medications + CAD and + Peripheral Vascular Disease (ANASTOMOTIC DISRUPTURE, ACTIVELY BLEEDING)  + dysrhythmias Atrial Fibrillation  Rhythm:irregular Rate:Normal  TTE (07/26/21): EF 55-60%. Normal valve function.   Neuro/Psych  Neuromuscular disease negative psych ROS   GI/Hepatic GERD  ,  Endo/Other  diabetes, Type 2  Renal/GU      Musculoskeletal   Abdominal   Peds  Hematology  (+) Blood dyscrasia (Eliquis), anemia ,   Anesthesia Other Findings   Reproductive/Obstetrics                             Anesthesia Physical  Anesthesia Plan  ASA: 5 and emergent  Anesthesia Plan: General   Post-op Pain Management:    Induction: Intravenous  PONV Risk Score and Plan: 1 and Ondansetron, Dexamethasone and Treatment may vary due to age or medical condition  Airway Management Planned: Oral ETT  Additional Equipment:   Intra-op Plan:   Post-operative Plan: Extubation in OR  Informed Consent: I have reviewed the patients History and Physical, chart, labs and discussed the procedure including the risks, benefits and alternatives for the proposed anesthesia with the patient or authorized representative who has indicated his/her understanding and acceptance.     Dental advisory given  Plan Discussed with: CRNA, Anesthesiologist and Surgeon  Anesthesia Plan Comments: (PAT note by Karoline Caldwell, PA-C: Follows with cardiology for history of extensive  atherosclerotic disease involving the coronary and lower extremity arterial circulation (remote PCI of the LAD in 1988), permanent atrial fibrillation, hyperlipidemia, hypertension. 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 Last seen by Dr. Sallyanne Kuster 07/21/2021 and discussed upcoming surgery.  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 Plan for femorofemoral bypass next week with Dr. 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 Melvyn Novas. Pt prescribed 4L O2 at night and with activity to maintain sats >90%. He is maintained on symbicort and spiriva, although notes indicate poor compliance. Treated for COPD exacerbation on 06/07/21 with augmentin x 10d.   Preop labs reviewed, glucose elevated at 252, otherwise unremarkable.  EKG 07/03/2021: Atrial fibrillation.  Rate 78.  Bifascicular block.  TTE 07/26/21: 1. Left ventricular ejection fraction, by estimation, is 55 to 60%. The  left ventricle has normal function. The left ventricle has no regional  wall motion abnormalities. Left ventricular diastolic function could not  be evaluated. There is the  interventricular septum is flattened in systole, consistent with right  ventricular pressure overload.  2. Right ventricular systolic function is mildly reduced. The right  ventricular size is moderately enlarged. There is moderately elevated  pulmonary artery systolic pressure.  3. Left atrial size was moderately dilated.  4. Right atrial  size was mild to moderately dilated.  5. The mitral valve is normal in structure. No evidence of mitral  valve  regurgitation.  6. The aortic valve is tricuspid. Aortic valve regurgitation is not  visualized. Mild aortic valve sclerosis is present, with no evidence of  aortic valve stenosis.  7. Aortic dilatation noted. There is mild dilatation of the ascending  aorta, measuring 40 mm. There is mild dilatation of the aortic root,  measuring 39 mm.  8. The inferior vena cava is dilated in size with <50% respiratory  variability, suggesting right atrial pressure of 15 mmHg.   Comparison(s): Prior images reviewed side by side. There is worsened  pulmonary hypertension, increased right atrial pressure. On direct  comparison, there is little change in RV size and systolic function.   Nuclear stress 02/09/2016: . No T wave inversion was noted during stress. . There was no ST segment deviation noted during stress. . Defect 1: There is a medium defect of moderate severity. . This is a low risk study. . Nuclear stress EF: 65%.  Medium size, moderate intensity fixed inferior attenuation artifact. No reversible ischemia. LVEF 65% with normal wall motion. This is a low risk study.  )        Anesthesia Quick Evaluation

## 2021-07-29 NOTE — Progress Notes (Addendum)
ANTICOAGULATION CONSULT NOTE - Initial Consult  Pharmacy Consult for heparin Indication: bypass patency  Allergies  Allergen Reactions   Codeine Anaphylaxis   Phenobarbital Hypertension    Patient Measurements: Height: 5' 7.5" (171.5 cm) Weight: 88 kg (194 lb) IBW/kg (Calculated) : 67.25 HEPARIN DW (KG): 85.2   Vital Signs: Temp: 99 F (37.2 C) (09/17 1945) Temp Source: Oral (09/17 1945) BP: 120/52 (09/17 2000) Pulse Rate: 82 (09/17 1600)  Labs: Recent Labs    07/17/2021 0200 08/06/2021 0226 07/15/2021 0346 07/13/2021 0800 07/16/2021 1222 07/27/2021 1800 08/03/2021 1829  HGB 8.7*   < > 8.5* 7.1*  --   --  8.5*  HCT 26.1*   < > 25.0* 22.7*  --   --  25.1*  PLT 168  --   --  161  --   --  140*  CREATININE  --   --   --  0.77 0.79 0.82  --    < > = values in this interval not displayed.    Estimated Creatinine Clearance (by C-G formula based on SCr of 0.82 mg/dL) Male: 61.9 mL/min Male: 75.5 mL/min   Medical History: Past Medical History:  Diagnosis Date   Asthma    Atrial fibrillation (HCC)    CAD (coronary artery disease)    Prior PTCA   COPD (chronic obstructive pulmonary disease) (HCC)    Diabetes mellitus    GERD (gastroesophageal reflux disease)    Hard of hearing    Hyperlipidemia    Hypertension    lung ca dx'd 01/2006   Nephrolithiasis    Neuropathy    Osteopenia    Pulmonary embolus (HCC)    Tibia/fibula fracture 07/2016   Left   Tremor, essential     Medications:  Medications Prior to Admission  Medication Sig Dispense Refill Last Dose   acetaminophen (TYLENOL) 500 MG tablet Take 1,000 mg by mouth every 8 (eight) hours as needed for moderate pain.   08/06/2021 at 0430   albuterol (VENTOLIN HFA) 108 (90 Base) MCG/ACT inhaler Inhale 2 puffs into the lungs every 2 (two) hours as needed for wheezing or shortness of breath.    08/05/2021 at 0430   atorvastatin (LIPITOR) 40 MG tablet TAKE 1 TABLET BY MOUTH AT  BEDTIME (Patient taking differently: Take 40  mg by mouth daily.) 90 tablet 0 07/27/2021   calcium carbonate (OS-CAL) 600 MG TABS tablet Take 600 mg by mouth every evening.   07/27/2021   Cholecalciferol (VITAMIN D3) 20 MCG (800 UNIT) TABS Take 800 Units by mouth daily.   07/27/2021   fexofenadine (ALLEGRA) 180 MG tablet Take 180 mg by mouth daily.   07/20/2021 at 0430   fluticasone (FLONASE) 50 MCG/ACT nasal spray Place 1 spray into the nose 2 (two) times daily.   08/11/2021 at 0430   furosemide (LASIX) 20 MG tablet TAKE 1 TABLET BY MOUTH  DAILY (Patient taking differently: Take 20 mg by mouth daily.) 90 tablet 0 07/27/2021   gabapentin (NEURONTIN) 300 MG capsule Take 1,200 mg by mouth at bedtime. 4 at bedtime   07/27/2021   guaiFENesin (MUCINEX) 600 MG 12 hr tablet Take 1,200 mg by mouth daily with breakfast.    08/10/2021 at 0430   losartan (COZAAR) 50 MG tablet Take 1 tablet (50 mg total) by mouth daily. (Patient taking differently: Take 50 mg by mouth daily with breakfast.) 90 tablet 0 07/27/2021   metFORMIN (GLUCOPHAGE) 1000 MG tablet Take 1,000 mg by mouth 2 (two) times daily with a  meal.   07/27/2021   metoCLOPramide (REGLAN) 5 MG tablet Take 5 mg by mouth 3 (three) times daily before meals.   07/27/2021   Multiple Vitamins-Minerals (CENTRUM SILVER ADULT 50+) TABS Take 1 tablet by mouth daily with breakfast.   07/27/2021   omeprazole (PRILOSEC OTC) 20 MG tablet Take 20 mg by mouth daily as needed (heartburn).   Past Week   OXYGEN Inhale 3-4 L into the lungs continuous. 4L of oxygen at night and 3L of oxygen when walking long distances   07/27/2021   vitamin C (ASCORBIC ACID) 250 MG tablet Take 250 mg by mouth daily.   07/27/2021   ELIQUIS 5 MG TABS tablet TAKE 1 TABLET BY MOUTH  TWICE DAILY (Patient taking differently: Take 5 mg by mouth 2 (two) times daily.) 180 tablet 3 07/17/2021   empagliflozin (JARDIANCE) 25 MG TABS tablet Take 25 mg by mouth daily.   07/26/2021   SHINGRIX injection    More than a month    Assessment: 57 male s/p right to left  femoral-femoral bypass on 9/16. Pharmacy consulted to dose heparin for bypass patency. He is noted on apixaban PTA (last dose taken 07/17/21) -5000 units sq heparin given ~ 3pm  Goal of Therapy:  Heparin level 0.3-0.7 units/ml aPTT 66-102 seconds Monitor platelets by anticoagulation protocol: Yes   Plan:  -No heparin bolus -Start heparin at 1100 units/ht -aPTT and heparin level in 8 hrs -daily heparin level and CBC  Hildred Laser, PharmD Clinical Pharmacist **Pharmacist phone directory can now be found on [email or web address] ([email or web address]).  Listed under [place].

## 2021-07-29 NOTE — Progress Notes (Addendum)
Went to check on patient due to H.R.up 120's At. Fib. Ask patient if he was O.K. Patient states, "NO." I ask him what was wrong. Patient stated " I hurt right here and it is getting bigger. Check sites Right side has larger hematoma and hard up into right lower abdo. Left groin hematoma larger and harder also. Marked sites. Did dopplers Got Rt. Post. Tib. With doppler and very faint Right D.P. and P.P. foot cold. Unable to get pulse in left foot done by 2 R.N. And 2 differrent dopplers. Lt. Foot cool to touch. Only  pain is in groins Med. With Morphine 2 mg I.V. Tim R.N. has been in room and helping with patient. Erin Hearing. with [facility] was called and made aware and coming to see patient. Dr. Virl Cagey was notified and on his way to see patient. M.D.order N.S. fl. Bolus 1 liter now and stat CBC and Ct. Scan. Patient was taking to ct scan with [facility]. and then back to room . At. 01:05 anesth. Was called report and transfer to O.R.with Loralee Pacas.N.Patient was alert and orin. Skin cool and pale. Abdo. Binder applied per Dr. Virl Cagey order while in C.T. scan. Dr. Virl Cagey was going to call wife and son to update them on the above and plan for O.R.

## 2021-07-29 NOTE — Anesthesia Procedure Notes (Deleted)
Arterial Line Insertion Start/End09/27/2022 1:30 AM, 08/04/2021 1:36 AM Performed by: Catalina Gravel, MD, anesthesiologist  Patient location: OR. Preanesthetic checklist: patient identified, IV checked, site marked, risks and benefits discussed, surgical consent, monitors and equipment checked, pre-op evaluation, timeout performed and anesthesia consent Lidocaine 1% used for infiltration Left, radial was placed Catheter size: 20 G Hand hygiene performed  and maximum sterile barriers used   Attempts: 1 Procedure performed without using ultrasound guided technique. Following insertion, dressing applied and Biopatch. Post procedure assessment: normal and unchanged  Patient tolerated the procedure well with no immediate complications.

## 2021-07-29 NOTE — Progress Notes (Signed)
PT Cancellation Note  Patient Details Name: Paul [name] MRN: [id number] DOB: 05-15-1941   Cancelled Treatment:    Reason Eval/Treat Not Completed: Medical issues which prohibited therapy at this time as pt returned to OR this morning due to post-op bleeding requiring evacuation and removal of clots from the femoral-femoral bypass. Will continue to follow and evaluate as appropriate.   West Carbo, PT, DPT   Acute Rehabilitation Department Pager #: [phone]   [name] 07/13/2021, 2:03 PM

## 2021-07-29 NOTE — Progress Notes (Signed)
  Daily Progress Note  S/p: 08/09/2021 right to left femoral-femoral bypass with 8 mm dacryon complicated by anastomotic dehiscence, now status post revision, embolectomy. Patient with bilateral posterior tibial artery multiphasic signals  Subjective: Patient doing well this morning status post surgery.  Continues to complain of groin pain, however improved from last night.  Denies sensorimotor deficits in the feet other than longstanding neuropathy.   Objective: Vitals:   07/19/2021 1100 07/25/2021 1200  BP: (!) 103/52 (!) 109/48  Pulse:  90  Resp: 18 18  Temp:  98.3 F (36.8 C)  SpO2:  95%    Physical Examination  Friendly, obese male Significant ecchymosis on the left arm as well as lower abdomen, pannus, bilateral groins, mons, penis Bilateral Praveena vacuum-assisted therapy sponges Bilateral posterior tibial artery multiphasic signals, left-sided dorsalis pedis signal  ASSESSMENT/PLAN:   Patient is doing well status post left right femoral-femoral bypass now status post revision. Bedrest today Advance diet as tolerated Continue current medication regimen Patient can be transferred to 4 E. Every 4 hour CMS checks Continue VAC therapy to bilateral groins for total of 7 days (9/24) Follow-up labs, currently every 12 hours due to acute blood loss anemia yesterday.    Cassandria Santee MD [facility] [phone] 07/19/2021  12:13 PM

## 2021-07-29 NOTE — Evaluation (Signed)
Occupational Therapy Evaluation Patient Details Name: Paul [name] MRN: [id number] DOB: Mar 31, 1941 Today's Date: 07/22/2021   History of Present Illness 80 y.o. adult with known history of coronary artery disease status post PCI of the LAD in 1988, permanent atrial fibrillation, COPD, diabetes mellitus, essential hypertension, hyperlipidemia, previous lung cancer and tobacco use.  Patient was found to have occluded left common iliac artery.  Underwent right to left femoral-femoral bypass 9/16, with postoperative hematoma.  Returned to surgery 9/17 for: Bilateral groin exploration,  Right iliofemoral embolectomy,  Left iliofemoral embolectomy,  Femoral-femoral bypass revision.   Clinical Impression   Patient admitted for the diagnosis and procedure above.  PTA he lives with his son and wife, who are able to provide supportive assistance, but not necessarily physical assist.  He used a RW for mobility, but was able to complete his own ADL, did participate with cooking, and did drive on occasion.  Deficits impacting independence are listed below.  Currently he is restricted to bed level due to additional surgery this am, but is very weak, and needed max A for attempted bed mobility.  OT to follow in the acute setting to maximize functions, but SNF is recommended post acute for rehab.      Recommendations for follow up therapy are one component of a multi-disciplinary discharge planning process, led by the attending physician.  Recommendations may be updated based on patient status, additional functional criteria and insurance authorization.   Follow Up Recommendations  SNF    Equipment Recommendations  None recommended by OT    Recommendations for Other Services       Precautions / Restrictions Precautions Precaution Comments: Wound vac.  L great toe wound Restrictions Weight Bearing Restrictions: No      Mobility Bed Mobility Overal bed mobility: Needs Assistance Bed Mobility:  Rolling Rolling: Max assist         General bed mobility comments: unable to sit edge of bed to dangle.  RN requests no OOB this date due to return to surgery this am. Patient Response: Cooperative  Transfers                      Balance                                           ADL either performed or assessed with clinical judgement   ADL Overall ADL's : Needs assistance/impaired     Grooming: Wash/dry hands;Wash/dry face;Bed level;Set up   Upper Body Bathing: Moderate assistance;Bed level   Lower Body Bathing: Total assistance;Bed level   Upper Body Dressing : Moderate assistance;Bed level   Lower Body Dressing: Total assistance;Bed level       Toileting- Clothing Manipulation and Hygiene: Total assistance;Bed level               Vision Baseline Vision/History: 1 Wears glasses Patient Visual Report: No change from baseline;Blurring of vision       Perception     Praxis      Pertinent Vitals/Pain Pain Assessment: Faces Faces Pain Scale: Hurts little more Pain Location: groin and operative site Pain Descriptors / Indicators: Operative site guarding Pain Intervention(s): Monitored during session     Hand Dominance Right   Extremity/Trunk Assessment Upper Extremity Assessment Upper Extremity Assessment: Overall WFL for tasks assessed   Lower Extremity Assessment Lower Extremity Assessment: Defer to PT  evaluation       Communication Communication Communication: No difficulties   Cognition Arousal/Alertness: Lethargic Behavior During Therapy: WFL for tasks assessed/performed Overall Cognitive Status: Within Functional Limits for tasks assessed                                 General Comments: RN notes post operative confusion, but is returning to baseline.   General Comments       Exercises     Shoulder Instructions      Home Living Family/patient expects to be discharged to:: Private  residence Living Arrangements: Spouse/significant other;Children (son) Available Help at Discharge: Family;Available 24 hours/day Type of Home: House Home Access: Stairs to enter [employer] of Steps: 1   Home Layout: One level     Bathroom Shower/Tub: Occupational psychologist: Standard Bathroom Accessibility: Yes How Accessible: Accessible via walker Home Equipment: [place] - 2 wheels;Shower seat;Grab bars - tub/shower;Hand held shower head          Prior Functioning/Environment Level of Independence: Independent with assistive device(s)        Comments: Uses a RW for mobility in/out of the home.  patient drives locally on occasion, completes his own ADL, participates with meal prep and light home management.  Wife does most of the driving.  Son works during the day.        OT Problem List: Decreased strength;Decreased activity tolerance;Impaired balance (sitting and/or standing)      OT Treatment/Interventions: Self-care/ADL training;Therapeutic exercise;Therapeutic activities;Balance training;DME and/or AE instruction;Patient/family education    OT Goals(Current goals can be found in the care plan section) Acute Rehab OT Goals Patient Stated Goal: Rest OT Goal Formulation: With patient Time For Goal Achievement: 08/12/21 Potential to Achieve Goals: Fair  OT Frequency: Min 2X/week   Barriers to D/C:    none noted       Co-evaluation              AM-PAC OT "6 Clicks" Daily Activity     Outcome Measure Help from another person eating meals?: None Help from another person taking care of personal grooming?: A Little Help from another person toileting, which includes using toliet, bedpan, or urinal?: Total Help from another person bathing (including washing, rinsing, drying)?: A Lot Help from another person to put on and taking off regular upper body clothing?: A Lot Help from another person to put on and taking off regular lower body  clothing?: A Lot 6 Click Score: 14   End of Session    Activity Tolerance: Patient limited by lethargy Patient left: in bed;with call bell/phone within reach;with bed alarm set  OT Visit Diagnosis: Muscle weakness (generalized) (M62.81);Pain                Time: 1104-1120 OT Time Calculation (min): 16 min Charges:  OT General Charges $OT Visit: 1 Visit OT Evaluation $OT Eval Moderate Complexity: 1 Mod  08/09/2021  RP, OTR/L  Acute Rehabilitation Services  Office:  [phone]   Paul [name] 07/27/2021, 11:34 AM

## 2021-07-29 NOTE — Anesthesia Postprocedure Evaluation (Signed)
Anesthesia Post Note  Patient: Paul [name]  Procedure(s) Performed: INTRA OPERATIVE ARTERIOGRAM WITH BYPASS ANGIOGRAPHY (Right: Groin) THROMBECTOMY BILATERAL ILIAC ARTERY (Bilateral: Groin) APPLICATION OF WOUND VAC BILATERAL GROINS (Bilateral: Groin) BILATERAL FEMORAL ARTERY EXPLORATION WITH BYPASS REVISION (Bilateral: Groin)     Patient location during evaluation: PACU Anesthesia Type: General Level of consciousness: awake and alert Pain management: pain level controlled Vital Signs Assessment: post-procedure vital signs reviewed and stable Respiratory status: spontaneous breathing, nonlabored ventilation, respiratory function stable and patient connected to nasal cannula oxygen Cardiovascular status: blood pressure returned to baseline and stable Postop Assessment: no apparent nausea or vomiting Anesthetic complications: no   No notable events documented.  Last Vitals:  Vitals:   07/24/2021 0445 08/08/2021 0500  BP: (!) 151/72   Pulse: 92   Resp: (!) 21 (!) 23  Temp: (!) 36.4 C   SpO2: 98%     Last Pain:  Vitals:   07/27/2021 0430  TempSrc:   PainSc: 0-No pain                 Catalina Gravel

## 2021-07-29 NOTE — Op Note (Signed)
NAME: Paul [name]    MRN: [id number] DOB: [birth year]    DATE OF OPERATION: 07/31/2021  PREOP DIAGNOSIS:    Postoperative bleeding  POSTOP DIAGNOSIS:    Peripheral arterial disease  PROCEDURE:    Bilateral groin exploration Right iliofemoral embolectomy Left iliofemoral embolectomy Femoral-femoral bypass revision Bilateral iliofemoral angiography  SURGEON: Broadus John  ASSIST: Risa Grill, PA  ANESTHESIA: General  EBL: 150 mL  INDICATIONS:    Paul [name] is a 80 y.o. adult with Ruther[place] 5 critical limb ischemia who earlier in the day underwent right to left femoral-femoral bypass using 8 mm dacryon.  The case was uncomplicated.  Postoperatively the hematoma was appreciated, however it was soft.  Later in the evening, the hematoma expanded rapidly, and imaging demonstrated bleeding from the right femoral anastomosis.  FINDINGS:   Dehiscence of the bypass graft at the heel of the right femoral artery anastomosis Thrombosis of the femoral-femoral bypass  TECHNIQUE:   The patient was brought to the OR urgently and laid in supine position.  General anesthesia was induced and the patient was prepped and draped in standard fashion.  The procedure began with exploration through the previous right groin incision.  This was taken down to the femoral anastomosis.  A significant amount of hematoma was removed.  Once arterial control was obtained, the anastomosis was examined which demonstrated dehiscence at the heel.  The bypass graft was noted to be occluded.  The patient was heparinized with 5000 units IV heparin.  The anastomosis was taken down.  A #3 embolectomy catheter was brought onto the field and right iliofemoral embolectomy performed.  Next, the embolectomy catheter was run through the femoral-femoral bypass with significant clot return.  Multiple passes were undertaken until 3 clean passes occurred. There was moderate backbleeding.  With backbleeding, the  anastomosis was resewn using 5-0 Prolene and angiography performed.  Angiography demonstrated patent right iliofemoral segment, however there continued to be thrombus in the bypass graft.  I then moved to expose the bypass graft in the left leg through the previous incision. Once vascular control was obtained, the anastomosis was taken down and clot was removed.  Left-sided iliofemoral thrombectomy followed.  There was healthy backbleeding noted at this point.  The anastomosis was resewn using 5-0 Prolene.   Completion angiography through the bypass graft demonstrated kinking of the dacryon at the level of the left groin.  This was straightened, with follow-up imaging demonstrating excellent filling of the bypass graft, left femoral artery, SFA and profunda.  At that time, there were multiphasic posterior tibial artery signals, and the decision was made to close. The wounds were irrigated and hemostasis achieved with the use of cautery and suture.  Both groins were closed in layers of 2-0 and 3-0 Vicryl with skin staples and Prevena vacuum dressings at the level of the skin.  Given the complexity of the case a first assistant was necessary in order to expedient the procedure and safely perform the technical aspects of the operation.  Plan: The patient received 3 units PRBCs, 2 FFP.  He would benefit from ICU resuscitation after requiring significant vasopressor. My biggest concern with this gentleman is wound healing, specially now that both groins have been opened twice and the patient is morbidly obese.  This is why the Prevena vacuum dressings were placed.  I updated his wife regarding the above procedure and my concerns.   [name], MD [facility]  DATE OF  DICTATION:   07/18/2021

## 2021-07-29 NOTE — Anesthesia Procedure Notes (Addendum)
Procedure Name: Intubation Date/Time: 07/24/2021 1:26 AM Performed by: Josephine Igo, CRNA Pre-anesthesia Checklist: Patient identified, Emergency Drugs available, Suction available, Patient being monitored and Timeout performed Patient Re-evaluated:Patient Re-evaluated prior to induction Oxygen Delivery Method: Circle system utilized Preoxygenation: Pre-oxygenation with 100% oxygen Induction Type: IV induction Laryngoscope Size: Miller and 1 Grade View: Grade I Tube type: Oral Tube size: 7.5 mm Number of attempts: 1 Airway Equipment and Method: Stylet Placement Confirmation: ETT inserted through vocal cords under direct vision, CO2 detector and breath sounds checked- equal and bilateral Secured at: 23 cm Tube secured with: Tape Dental Injury: Teeth and Oropharynx as per pre-operative assessment

## 2021-07-30 ENCOUNTER — Encounter (HOSPITAL_COMMUNITY): Payer: Self-pay | Admitting: Vascular Surgery

## 2021-07-30 LAB — CBC
HCT: 24.5 % — ABNORMAL LOW (ref 39.0–52.0)
HCT: 24.6 % — ABNORMAL LOW (ref 39.0–52.0)
Hemoglobin: 8.1 g/dL — ABNORMAL LOW (ref 13.0–17.0)
Hemoglobin: 8.1 g/dL — ABNORMAL LOW (ref 13.0–17.0)
MCH: 28.8 pg (ref 26.0–34.0)
MCH: 28.7 pg (ref 26.0–34.0)
MCHC: 33.1 g/dL (ref 30.0–36.0)
MCHC: 32.9 g/dL (ref 30.0–36.0)
MCV: 87.2 fL (ref 80.0–100.0)
MCV: 87.2 fL (ref 80.0–100.0)
Platelets: 149 10*3/uL — ABNORMAL LOW (ref 150–400)
Platelets: 154 10*3/uL (ref 150–400)
RBC: 2.81 MIL/uL — ABNORMAL LOW (ref 4.22–5.81)
RBC: 2.82 MIL/uL — ABNORMAL LOW (ref 4.22–5.81)
RDW: 16.8 % — ABNORMAL HIGH (ref 11.5–15.5)
RDW: 16.6 % — ABNORMAL HIGH (ref 11.5–15.5)
WBC: 13 10*3/uL — ABNORMAL HIGH (ref 4.0–10.5)
WBC: 13.7 10*3/uL — ABNORMAL HIGH (ref 4.0–10.5)
nRBC: 0 % (ref 0.0–0.2)
nRBC: 0 % (ref 0.0–0.2)

## 2021-07-30 LAB — PREPARE FRESH FROZEN PLASMA: Unit division: 0

## 2021-07-30 LAB — APTT: aPTT: 49 seconds — ABNORMAL HIGH (ref 24–36)

## 2021-07-30 LAB — BASIC METABOLIC PANEL
Anion gap: 11 (ref 5–15)
Anion gap: 9 (ref 5–15)
BUN: 17 mg/dL (ref 8–23)
BUN: 17 mg/dL (ref 8–23)
CO2: 20 mmol/L — ABNORMAL LOW (ref 22–32)
CO2: 21 mmol/L — ABNORMAL LOW (ref 22–32)
Calcium: 7.8 mg/dL — ABNORMAL LOW (ref 8.9–10.3)
Calcium: 7.9 mg/dL — ABNORMAL LOW (ref 8.9–10.3)
Chloride: 102 mmol/L (ref 98–111)
Chloride: 104 mmol/L (ref 98–111)
Creatinine, Ser: 0.9 mg/dL (ref 0.61–1.24)
Creatinine, Ser: 0.76 mg/dL (ref 0.61–1.24)
GFR, Estimated: >60 mL/min (ref >60)
GFR, Estimated: >60 mL/min (ref >60)
Glucose, Bld: 172 mg/dL — ABNORMAL HIGH (ref 70–99)
Glucose, Bld: 160 mg/dL — ABNORMAL HIGH (ref 70–99)
Potassium: 3.9 mmol/L (ref 3.5–5.1)
Potassium: 3.8 mmol/L (ref 3.5–5.1)
Sodium: 133 mmol/L — ABNORMAL LOW (ref 135–145)
Sodium: 134 mmol/L — ABNORMAL LOW (ref 135–145)

## 2021-07-30 LAB — BPAM FFP
Blood Product Expiration Date: 202209192359
Blood Product Expiration Date: 202209192359
ISSUE DATE / TIME: 202209170213
ISSUE DATE / TIME: 202209170213
Unit Type and Rh: 6200
Unit Type and Rh: 8400

## 2021-07-30 LAB — GLUCOSE, CAPILLARY
Glucose-Capillary: 141 mg/dL — ABNORMAL HIGH (ref 70–99)
Glucose-Capillary: 148 mg/dL — ABNORMAL HIGH (ref 70–99)
Glucose-Capillary: 138 mg/dL — ABNORMAL HIGH (ref 70–99)

## 2021-07-30 LAB — HEPARIN LEVEL (UNFRACTIONATED)
Heparin Unfractionated: 0.15 IU/mL — ABNORMAL LOW (ref 0.30–0.70)
Heparin Unfractionated: 0.19 IU/mL — ABNORMAL LOW (ref 0.30–0.70)
Heparin Unfractionated: 0.18 IU/mL — ABNORMAL LOW (ref 0.30–0.70)

## 2021-07-30 NOTE — Progress Notes (Signed)
  Daily Progress Note  S/p: 07/20/2021 right to left femoral-femoral bypass with 8 mm dacryon complicated by anastomotic dehiscence, now status post revision, embolectomy. Patient with bilateral posterior tibial artery multiphasic signals  Subjective: Patient doing well this morning. OOB in the chair. Mild to moderate groin pain.  Hep gtt.   Objective: Vitals:   07/30/21 0600 07/30/21 0747  BP: 136/69   Pulse:    Resp: (!) 23   Temp:  98.6 F (37 C)  SpO2:      Physical Examination  Friendly, obese male Significant ecchymosis on the left arm as well as lower abdomen, pannus, bilateral groins, mons, penis Bilateral Praveena vacuum-assisted therapy sponges Bilateral posterior tibial artery multiphasic signals, left-sided dorsalis pedis signal  ASSESSMENT/PLAN:   Patient is doing well status post left right femoral-femoral bypass now status post revision.  OOB to chair, appreciate PT and OT following Regular diet Continue heparin gtt, will transition back to Eliquis in the coming days Patient can be transferred to 4 E. Every 4 hour CMS checks Continue VAC therapy to bilateral groins for total of 7 days (9/24) Follow-up labs, qday labs    J. Melene Muller MD [facility] [phone] 07/30/2021  10:05 AM

## 2021-07-30 NOTE — Progress Notes (Signed)
ANTICOAGULATION CONSULT NOTE   Pharmacy Consult for heparin Indication: bypass patency  Allergies  Allergen Reactions   Codeine Anaphylaxis   Phenobarbital Hypertension    Patient Measurements: Height: 5' 7.5" (171.5 cm) Weight: 88 kg (194 lb) IBW/kg (Calculated) : 67.25 HEPARIN DW (KG): 85.2   Vital Signs: Temp: 99 F (37.2 C) (09/18 1555) Temp Source: Oral (09/18 1555) BP: 95/45 (09/18 1600) Pulse Rate: 92 (09/18 1600)  Labs: Recent Labs    08/10/2021 1800 07/26/2021 1829 07/30/21 0340 07/30/21 0400 07/30/21 0800 07/30/21 1300 07/30/21 1721 07/30/21 2237  HGB  --  8.5*  --  8.1* 8.1*  --   --   --   HCT  --  25.1*  --  24.5* 24.6*  --   --   --   PLT  --  140*  --  149* 154  --   --   --   APTT  --   --   --   --  49*  --   --   --   HEPARINUNFRC  --   --  0.15*  --   --  0.19*  --  0.18*  CREATININE 0.82  --   --   --  0.90  --  0.76  --      Estimated Creatinine Clearance (by C-G formula based on SCr of 0.76 mg/dL) Male: 63.5 mL/min Male: 77.4 mL/min  Assessment: 36 male s/p right to left femoral-femoral bypass on 9/16. Pharmacy consulted to dose heparin for bypass patency. He is noted on apixaban PTA (last dose taken 07/17/21). Apixaban unlikely to still be affecting heparin level.  Heparin level remains subtherapeutic (0.18) on gtt at 1450 units/hr. No movement in heparin level after last rate increase. No issues with line or bleeding reported per RN.  Goal of Therapy:  Heparin level 0.3-0.7 units/ml Monitor platelets by anticoagulation protocol: Yes   Plan:  Increase heparin infusion to 1650 units/hr Heparin level in 8 hours  Sherlon Handing, PharmD, BCPS Please see amion for complete clinical pharmacist phone list 07/30/2021

## 2021-07-30 NOTE — Progress Notes (Signed)
[place] for heparin Indication: bypass patency  Allergies  Allergen Reactions   Codeine Anaphylaxis   Phenobarbital Hypertension    Patient Measurements: Height: 5' 7.5" (171.5 cm) Weight: 88 kg (194 lb) IBW/kg (Calculated) : 67.25 HEPARIN DW (KG): 85.2   Vital Signs: Temp: 97.7 F (36.5 C) (09/18 1141) Temp Source: Oral (09/18 1141) BP: 136/69 (09/18 0600) Pulse Rate: 127 (09/18 0400)  Labs: Recent Labs    07/18/2021 1222 08/06/2021 1800 07/31/2021 1829 07/30/21 0340 07/30/21 0400 07/30/21 0800 07/30/21 1300  HGB  --   --  8.5*  --  8.1* 8.1*  --   HCT  --   --  25.1*  --  24.5* 24.6*  --   PLT  --   --  140*  --  149* 154  --   APTT  --   --   --   --   --  49*  --   HEPARINUNFRC  --   --   --  0.15*  --   --  0.19*  CREATININE 0.79 0.82  --   --   --  0.90  --      Estimated Creatinine Clearance (by C-G formula based on SCr of 0.9 mg/dL) Male: 56.4 mL/min Male: 68.8 mL/min   Medical History: Past Medical History:  Diagnosis Date   Asthma    Atrial fibrillation (HCC)    CAD (coronary artery disease)    Prior PTCA   COPD (chronic obstructive pulmonary disease) (HCC)    Diabetes mellitus    GERD (gastroesophageal reflux disease)    Hard of hearing    Hyperlipidemia    Hypertension    lung ca dx'd 01/2006   Nephrolithiasis    Neuropathy    Osteopenia    Pulmonary embolus (HCC)    Tibia/fibula fracture 07/2016   Left   Tremor, essential     Assessment: 35 male s/p right to left femoral-femoral bypass on 9/16. Pharmacy consulted to dose heparin for bypass patency. He is noted on apixaban PTA (last dose taken 07/17/21). Apixaban unlikely to still be affecting heparin level.  Heparin level remains subtherapeutic after rate adjustment at 0.19.  Goal of Therapy:  Heparin level 0.3-0.7 units/ml aPTT 66-102 seconds Monitor platelets by anticoagulation protocol: Yes   Plan:  Increase heparin infusion to 1450  units/hr Heparin level in 8 hours  Arrie Senate, PharmD, [place], [facility] Clinical Pharmacist [phone] Please check AMION for all [place] numbers 07/30/2021

## 2021-07-30 NOTE — Progress Notes (Signed)
[place] for heparin Indication: bypass patency  Allergies  Allergen Reactions   Codeine Anaphylaxis   Phenobarbital Hypertension    Patient Measurements: Height: 5' 7.5" (171.5 cm) Weight: 88 kg (194 lb) IBW/kg (Calculated) : 67.25 HEPARIN DW (KG): 85.2   Vital Signs: Temp: 99.1 F (37.3 C) (09/18 0004) Temp Source: Oral (09/18 0004) BP: 118/60 (09/18 0000)  Labs: Recent Labs    07/15/2021 0800 07/31/2021 1222 08/03/2021 1800 07/31/2021 1829 07/30/21 0340 07/30/21 0400  HGB 7.1*  --   --  8.5*  --  8.1*  HCT 22.7*  --   --  25.1*  --  24.5*  PLT 161  --   --  140*  --  149*  HEPARINUNFRC  --   --   --   --  0.15*  --   CREATININE 0.77 0.79 0.82  --   --   --      Estimated Creatinine Clearance (by C-G formula based on SCr of 0.82 mg/dL) Male: 61.9 mL/min Male: 75.5 mL/min   Medical History: Past Medical History:  Diagnosis Date   Asthma    Atrial fibrillation (HCC)    CAD (coronary artery disease)    Prior PTCA   COPD (chronic obstructive pulmonary disease) (HCC)    Diabetes mellitus    GERD (gastroesophageal reflux disease)    Hard of hearing    Hyperlipidemia    Hypertension    lung ca dx'd 01/2006   Nephrolithiasis    Neuropathy    Osteopenia    Pulmonary embolus ([place])    Tibia/fibula fracture 07/2016   Left   Tremor, essential     Assessment: 8 male s/p right to left femoral-femoral bypass on 9/16. Pharmacy consulted to dose heparin for bypass patency. He is noted on apixaban PTA (last dose taken 07/17/21). Apixaban unlikely to still be affecting heparin level.  Heparin level subtherapeutic (0.15) on gtt at 1100 units/hr. Level drawn ~5.5 hours post gtt start. No issues with line or bleeding reported per RN.  Goal of Therapy:  Heparin level 0.3-0.7 units/ml aPTT 66-102 seconds Monitor platelets by anticoagulation protocol: Yes   Plan:  Increase heparin infusion to 1250 units/hr Heparin level in 8  hours  Sherlon Handing, PharmD, BCPS Please see amion for complete clinical pharmacist phone list 07/30/2021 4:50 AM

## 2021-07-30 NOTE — Evaluation (Signed)
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 Pt  with postoperative hematoma.  Returned to surgery 9/17 for: bilateral groin exploration, rt iliofemoral embolectomy, left iliofemoral embolectomy, and Femoral-femoral bypass revision. PMH - CAD, afib, copd, DM, HTN, lung CA.  Clinical Impression  Pt presents to PT with significant impairment of mobility due to weakness and poor balance. Do not feel he has the physical support he will need at home at current level so feel he will need SNF prior to return home.        Recommendations for follow up therapy are one component of a multi-disciplinary discharge planning process, led by the attending physician.  Recommendations may be updated based on patient status, additional functional criteria and insurance authorization.  Follow Up Recommendations SNF    Equipment Recommendations  None recommended by PT    Recommendations for Other Services       Precautions / Restrictions Precautions Precautions: Fall;Other (comment) Precaution Comments: Wound vac.  L great toe wound      Mobility  Bed Mobility Overal bed mobility: Needs Assistance Bed Mobility: Supine to Sit     Supine to sit: +2 for physical assistance;Mod assist     General bed mobility comments: Assist to bring legs off of bed, elevate trunk into sitting and bring hips to EOB.    Transfers Overall transfer level: Needs assistance Equipment used: Rolling walker (2 wheeled) Transfers: Sit to/from Stand Sit to Stand: Mod assist;+2 physical assistance;Max assist;+2 safety/equipment         General transfer comment: Mod assist to bring hips up and for balance coming to stand. +2 max assist to sit down in chair as pt leaning extremely heavily anteriorly while chair was right  behind him. Had to push hip posteriorly down and into chair.  Ambulation/Gait Ambulation/Gait assistance: +2 physical assistance;Mod assist Gait Distance (Feet): 3 Feet Assistive device: Rolling walker (2 wheeled) Gait Pattern/deviations: Step-to pattern;Decreased step length - left;Decreased step length - right;Shuffle;Trunk flexed Gait velocity: decr Gait velocity interpretation: <1.31 ft/sec, indicative of household ambulator General Gait Details: Assist for balance and support as anterior lean worsened as pt began ambulating.  Stairs            Wheelchair Mobility    Modified Rankin (Stroke Patients Only)       Balance Overall balance assessment: Needs assistance Sitting-balance support: No upper extremity supported;Feet supported Sitting balance-Leahy Scale: Fair     Standing balance support: Bilateral upper extremity supported Standing balance-Leahy Scale: Poor Standing balance comment: walker and mod assist for static standing initially.                             Pertinent Vitals/Pain Pain Assessment: Faces Faces Pain Scale: Hurts even more Pain Location: bilateral groin incisions Pain Descriptors / Indicators: Guarding;Grimacing Pain Intervention(s): Limited activity within patient's tolerance;Monitored during session;Repositioned    Home Living Family/patient expects to be discharged to:: Private residence Living Arrangements: Spouse/significant other;Children (adult son is special needs) Available Help at Discharge: Family;Available 24 hours/day Type of Home: House Home Access: Stairs to enter   [employer] of Steps: 1 Home Layout: One level Home Equipment: Walker - 2 wheels;Shower seat;Grab bars - tub/shower;Hand held shower head;Other (comment) Additional Comments: Wears O2 at night and sometimes during the  day (watching TV/going to MD office)    Prior Function Level of Independence: Independent with assistive device(s)          Comments: Uses a RW for mobility in/out of the home.  patient drives locally on occasion, completes his own ADL, participates with meal prep and light home management.  Wife does most of the driving.  Son works during the day.     Hand Dominance   Dominant Hand: Right    Extremity/Trunk Assessment   Upper Extremity Assessment Upper Extremity Assessment: Defer to OT evaluation    Lower Extremity Assessment Lower Extremity Assessment: Generalized weakness       Communication   Communication: No difficulties  Cognition Arousal/Alertness: Awake/alert Behavior During Therapy: WFL for tasks assessed/performed Overall Cognitive Status: Impaired/Different from baseline Area of Impairment: Safety/judgement;Problem solving                         Safety/Judgement: Decreased awareness of deficits   Problem Solving: Requires verbal cues;Requires tactile cues;Difficulty sequencing        General Comments General comments (skin integrity, edema, etc.): VSS on 3L    Exercises     Assessment/Plan    PT Assessment Patient needs continued PT services  PT Problem List Decreased strength;Decreased balance;Decreased mobility;Decreased knowledge of use of DME;Decreased activity tolerance;Decreased safety awareness;Obesity       PT Treatment Interventions DME instruction;Functional mobility training;Balance training;Patient/family education;Gait training;Therapeutic activities;Therapeutic exercise    PT Goals (Current goals can be found in the Care Plan section)  Acute Rehab PT Goals PT Goal Formulation: With patient Time For Goal Achievement: 08/13/21 Potential to Achieve Goals: Good    Frequency Min 3X/week   Barriers to discharge Decreased caregiver support wife not able to provide physical assistance    Co-evaluation               AM-PAC PT "6 Clicks" Mobility  Outcome Measure Help needed turning from your back to your side while in a flat bed  without using bedrails?: A Lot Help needed moving from lying on your back to sitting on the side of a flat bed without using bedrails?: Total Help needed moving to and from a bed to a chair (including a wheelchair)?: Total Help needed standing up from a chair using your arms (e.g., wheelchair or bedside chair)?: Total Help needed to walk in hospital room?: Total Help needed climbing 3-5 steps with a railing? : Total 6 Click Score: 7    End of Session Equipment Utilized During Treatment: Oxygen Activity Tolerance: Patient limited by fatigue Patient left: in chair;with call bell/phone within reach;with chair alarm set Nurse Communication: Mobility status PT Visit Diagnosis: Unsteadiness on feet (R26.81);Other abnormalities of gait and mobility (R26.89);Muscle weakness (generalized) (M62.81)    Time: 5974-7185 PT Time Calculation (min) (ACUTE ONLY): 24 min   Charges:   PT Evaluation $PT Eval Moderate Complexity: 1 Mod PT Treatments $Therapeutic Activity: 8-22 mins        [place]aves Pager [phone] Office [place] 07/30/2021, 2:09 PM

## 2021-07-31 ENCOUNTER — Inpatient Hospital Stay (HOSPITAL_COMMUNITY): Payer: Medicare Other

## 2021-07-31 ENCOUNTER — Encounter (HOSPITAL_COMMUNITY): Payer: Self-pay | Admitting: Vascular Surgery

## 2021-07-31 DIAGNOSIS — I4821 Permanent atrial fibrillation: Secondary | ICD-10-CM

## 2021-07-31 DIAGNOSIS — I739 Peripheral vascular disease, unspecified: Secondary | ICD-10-CM

## 2021-07-31 DIAGNOSIS — I5189 Other ill-defined heart diseases: Secondary | ICD-10-CM

## 2021-07-31 LAB — BASIC METABOLIC PANEL
Anion gap: 13 (ref 5–15)
Anion gap: 12 (ref 5–15)
BUN: 19 mg/dL (ref 8–23)
BUN: 21 mg/dL (ref 8–23)
CO2: 20 mmol/L — ABNORMAL LOW (ref 22–32)
CO2: 21 mmol/L — ABNORMAL LOW (ref 22–32)
Calcium: 8.1 mg/dL — ABNORMAL LOW (ref 8.9–10.3)
Calcium: 7.9 mg/dL — ABNORMAL LOW (ref 8.9–10.3)
Chloride: 102 mmol/L (ref 98–111)
Chloride: 100 mmol/L (ref 98–111)
Creatinine, Ser: 0.87 mg/dL (ref 0.61–1.24)
Creatinine, Ser: 0.9 mg/dL (ref 0.61–1.24)
GFR, Estimated: >60 mL/min (ref >60)
GFR, Estimated: >60 mL/min (ref >60)
Glucose, Bld: 130 mg/dL — ABNORMAL HIGH (ref 70–99)
Glucose, Bld: 156 mg/dL — ABNORMAL HIGH (ref 70–99)
Potassium: 4 mmol/L (ref 3.5–5.1)
Potassium: 4.4 mmol/L (ref 3.5–5.1)
Sodium: 135 mmol/L (ref 135–145)
Sodium: 133 mmol/L — ABNORMAL LOW (ref 135–145)

## 2021-07-31 LAB — CBC
HCT: 26.2 % — ABNORMAL LOW (ref 39.0–52.0)
Hemoglobin: 8.3 g/dL — ABNORMAL LOW (ref 13.0–17.0)
MCH: 28.4 pg (ref 26.0–34.0)
MCHC: 31.7 g/dL (ref 30.0–36.0)
MCV: 89.7 fL (ref 80.0–100.0)
Platelets: 182 10*3/uL (ref 150–400)
RBC: 2.92 MIL/uL — ABNORMAL LOW (ref 4.22–5.81)
RDW: 16.5 % — ABNORMAL HIGH (ref 11.5–15.5)
WBC: 12.7 10*3/uL — ABNORMAL HIGH (ref 4.0–10.5)
nRBC: 0.2 % (ref 0.0–0.2)

## 2021-07-31 LAB — GLUCOSE, CAPILLARY
Glucose-Capillary: 152 mg/dL — ABNORMAL HIGH (ref 70–99)
Glucose-Capillary: 137 mg/dL — ABNORMAL HIGH (ref 70–99)
Glucose-Capillary: 109 mg/dL — ABNORMAL HIGH (ref 70–99)
Glucose-Capillary: 142 mg/dL — ABNORMAL HIGH (ref 70–99)
Glucose-Capillary: 160 mg/dL — ABNORMAL HIGH (ref 70–99)
Glucose-Capillary: 129 mg/dL — ABNORMAL HIGH (ref 70–99)

## 2021-07-31 LAB — HEPARIN LEVEL (UNFRACTIONATED)
Heparin Unfractionated: 0.37 IU/mL (ref 0.30–0.70)
Heparin Unfractionated: 0.36 IU/mL (ref 0.30–0.70)

## 2021-07-31 MED ORDER — IOHEXOL 350 MG/ML SOLN
100.0000 mL | Freq: Once | INTRAVENOUS | Status: AC | PRN
  Administered 2021-07-31: 100 mL via INTRAVENOUS

## 2021-07-31 MED ORDER — DILTIAZEM HCL 60 MG PO TABS
30.0000 mg | ORAL_TABLET | Freq: Four times a day (QID) | ORAL | Status: DC
  Administered 2021-07-31 – 2021-08-01 (×4): 30 mg via ORAL
  Filled 2021-07-31 (×4): qty 1

## 2021-07-31 MED ORDER — FUROSEMIDE 10 MG/ML IJ SOLN
40.0000 mg | Freq: Once | INTRAMUSCULAR | Status: AC
  Administered 2021-07-31: 40 mg via INTRAVENOUS
  Filled 2021-07-31: qty 4

## 2021-07-31 MED ORDER — SODIUM CHLORIDE 0.9 % IN NEBU
3.0000 mL | INHALATION_SOLUTION | Freq: Three times a day (TID) | RESPIRATORY_TRACT | Status: DC | PRN
  Filled 2021-07-31: qty 3

## 2021-07-31 MED ORDER — LABETALOL HCL 5 MG/ML IV SOLN: 5.0000 mg | INTRAVENOUS | Status: DC | PRN

## 2021-07-31 NOTE — Progress Notes (Addendum)
[facility]  Subjective  - No new complaints    Objective 117/64 (!) 102 98.2 F (36.8 C) (Oral) (!) 31 96%  Intake/Output Summary (Last 24 hours) at 07/31/2021 0709 Last data filed at 07/31/2021 0345 Gross per 24 hour  Intake 1509.02 ml  Output 1095 ml  Net 414.02 ml    Doppler right PT brisk DP, left AT/DP B groins soft with incisional vac in place 75 cc total bloody drainage OP.  Moderate ecchymosis B Groins  Lungs non labored breathing   Assessment/Planning: POD # 2  Bilateral groin exploration Right iliofemoral embolectomy Left iliofemoral embolectomy Femoral-femoral bypass revision Bilateral iliofemoral angiography  08/09/2021 right to left femoral-femoral bypass with 8 mm dacryon HGB stable 8.1 blood loss anemia asymptomatic.  Heparin full dose for A fib  Urine OP > 1000 cc last 24 hours Tolerating PO's well Pending PT/OT for mobility OOB today D/C Foley Will order saline nebulizer to produce cough   Roxy Horseman 07/31/2021 7:09 AM  VASCULAR STAFF ADDENDUM: I have independently interviewed and examined the patient. I agree with the above.  Signals in bilateral lower extremities.  Graft patent. Patient with mild labored breathing this morning.  Will order saline nebs and incentive spirometry.  Does not appear fluid overloaded.  Obese at baseline. Continue home Lasix therapy. Daily weights.  We will continue to monitor.  Cassandria Santee, MD [facility] Phone Number: [phone] 07/31/2021 9:40 AM   --  Laboratory Lab Results: Recent Labs    07/30/21 0400 07/30/21 0800  WBC 13.0* 13.7*  HGB 8.1* 8.1*  HCT 24.5* 24.6*  PLT 149* 154   BMET Recent Labs    07/30/21 0800 07/30/21 1721  NA 133* 134*  K 3.9 3.8  CL 102 104  CO2 20* 21*  GLUCOSE 172* 160*  BUN 17 17  CREATININE 0.90 0.76  CALCIUM 7.8* 7.9*    COAG Lab Results  Component Value Date   INR  1.0 07/25/2021   INR 1.0 07/20/2021   INR 0.99 08/09/2016   No results found for: PTT

## 2021-07-31 NOTE — Progress Notes (Signed)
ANTICOAGULATION CONSULT NOTE   Pharmacy Consult for heparin Indication: bypass patency  Allergies  Allergen Reactions   Codeine Anaphylaxis   Phenobarbital Hypertension    Patient Measurements: Height: 5' 7.5" (171.5 cm) Weight: 88 kg (194 lb) IBW/kg (Calculated) : 67.25 HEPARIN DW (KG): 85.2   Vital Signs: Temp: 98.4 F (36.9 C) (09/19 1444) Temp Source: Oral (09/19 1444) BP: 110/55 (09/19 1444) Pulse Rate: 77 (09/19 0952)  Labs: Recent Labs    07/30/21 0400 07/30/21 0800 07/30/21 1300 07/30/21 1721 07/30/21 2237 07/31/21 0818 07/31/21 1627  HGB 8.1* 8.1*  --   --   --  8.3*  --   HCT 24.5* 24.6*  --   --   --  26.2*  --   PLT 149* 154  --   --   --  182  --   APTT  --  49*  --   --   --   --   --   HEPARINUNFRC  --   --    < >  --  0.18* 0.37 0.36  CREATININE  --  0.90  --  0.76  --  0.87  --    < > = values in this interval not displayed.    Estimated Creatinine Clearance (by C-G formula based on SCr of 0.87 mg/dL) Male: 58.4 mL/min Male: 71.2 mL/min  Assessment: 67 male s/p right to left femoral-femoral bypass on 9/16. Pharmacy consulted to dose heparin for bypass patency. He is noted on apixaban PTA (last dose taken 07/17/21). Apixaban unlikely to still be affecting heparin level.  Heparin level at goal on heparin gtt at 1650 units/hr. Heparin levels now therapeutic x2. No issues with line or bleeding reported per chart review. No changes needed, will transition to AM daily heparin level.  Goal of Therapy:  Heparin level 0.3-0.7 units/ml Monitor platelets by anticoagulation protocol: Yes   Plan:  - Continue IV heparin at 1650 units/hr - Daily heparin level and CBC - F/u plans to resume Eliquis    Thank you for allowing pharmacy to be a part of this patient's care.  Ardyth Harps, PharmD Clinical Pharmacist

## 2021-07-31 NOTE — Progress Notes (Signed)
Pt is transferred out of the floor to CT. Vital signs stable.   Paul [name] KVTXLE,ZV

## 2021-07-31 NOTE — Progress Notes (Signed)
ANTICOAGULATION CONSULT NOTE   Pharmacy Consult for heparin Indication: bypass patency  Allergies  Allergen Reactions   Codeine Anaphylaxis   Phenobarbital Hypertension    Patient Measurements: Height: 5' 7.5" (171.5 cm) Weight: 88 kg (194 lb) IBW/kg (Calculated) : 67.25 HEPARIN DW (KG): 85.2   Vital Signs: Temp: 98 F (36.7 C) (09/19 0801) Temp Source: Oral (09/19 0801) BP: 139/76 (09/19 0952) Pulse Rate: 77 (09/19 0952)  Labs: Recent Labs    07/30/21 0400 07/30/21 0800 07/30/21 1300 07/30/21 1721 07/30/21 2237 07/31/21 0818  HGB 8.1* 8.1*  --   --   --  8.3*  HCT 24.5* 24.6*  --   --   --  26.2*  PLT 149* 154  --   --   --  182  APTT  --  49*  --   --   --   --   HEPARINUNFRC  --   --  0.19*  --  0.18* 0.37  CREATININE  --  0.90  --  0.76  --  0.87     Estimated Creatinine Clearance (by C-G formula based on SCr of 0.87 mg/dL) Male: 58.4 mL/min Male: 71.2 mL/min  Assessment: 41 male s/p right to left femoral-femoral bypass on 9/16. Pharmacy consulted to dose heparin for bypass patency. He is noted on apixaban PTA (last dose taken 07/17/21). Apixaban unlikely to still be affecting heparin level.  Heparin level at goal on  on gtt at 1650 units/hr. No movement in heparin level after last rate increase. No issues with line or bleeding reported per RN.  Goal of Therapy:  Heparin level 0.3-0.7 units/ml Monitor platelets by anticoagulation protocol: Yes   Plan:  Continue IV heparin at current rate. Confirm heparin level in 8 hrs. Daily heparin level and CBC. F/u plans to resume Eliquis eventually.  [place] Crane, Roylene Reason, [facility] Clinical Pharmacist  07/31/2021 11:04 AM   [facility] pharmacy phone numbers are listed on [email or web address]

## 2021-07-31 NOTE — Consult Note (Addendum)
Cardiology Consultation:   Patient ID: Paul [name] MRN: [id number]; DOB: [birth year]  Admit date: 07/30/2021 Date of Consult: 07/31/2021  PCP:  Paul Jordan, MD   [facility] Providers Cardiologist:  Paul Ruths, MD   {  Patient Profile:   Paul [name] is a 80 y.o. adult with a hx of of coronary artery disease status post PCI of the LAD in 1988, permanent atrial fibrillation, PAD, COPD, diabetes mellitus, essential hypertension, hyperlipidemia, previous lung cancer and tobacco use who is being seen 07/31/2021 for the evaluation of afib RVR at the request of Paul [name].  Patient had unsuccessful angioplasty of the left common iliac artery via the antegrade left brachial artery access and left common femoral artery retrograde access by Paul [name] on 07/19/21. He was seen in ER 9/8 followed by DOD visit with Paul [name] on 9/9 for left upper extremity ecchymosis.  There was no palpable hematoma on exam.  Paul [name] recommended to continue to hold anticoagulation until femoro femoral bypass by Paul [name] on upcoming week.   History of Present Illness:   Paul [name] underwent right to left femoral-femoral bypass using 8 mm dacryon on 07/30/2021.  Patient quickly developed hematoma following procedure and found to have  bleeding from the right femoral anastomosis. S/p Bilateral groin exploration, right iliofemoral embolectomy, left iliofemoral embolectomy, femoral-femoral bypass revision And bilateral iliofemoral angiography on 07/26/2021.  Patient has been treated with heparin for bypass patency and atrial fibrillation.   Patient has chronic atrial fibrillation but does not take rate control agent in outpatient setting.  He is heart rate was fairly controlled up until this morning.  He transfer from bed to chair and heart rate compared to 130s to 140s.  His heart rate came down to 90s during my evaluation.  He did receive IV metoprolol x2 yesterday for transient tachycardia while attempting  movement. Cardiology is asked for management of atrial fibrillation with rapid ventricular rate.   Past Medical History:  Diagnosis Date   Asthma    Atrial fibrillation (HCC)    CAD (coronary artery disease)    Prior PTCA   COPD (chronic obstructive pulmonary disease) (HCC)    Diabetes mellitus    GERD (gastroesophageal reflux disease)    Hard of hearing    Hyperlipidemia    Hypertension    lung ca dx'd 01/2006   Nephrolithiasis    Neuropathy    Osteopenia    Pulmonary embolus (HCC)    Tibia/fibula fracture 07/2016   Left   Tremor, essential     Past Surgical History:  Procedure Laterality Date   ABDOMINAL AORTOGRAM W/LOWER EXTREMITY N/A 07/12/2021   Procedure: ABDOMINAL AORTOGRAM W/LOWER EXTREMITY;  Surgeon: Paul Hampshire, MD;  Location: [place] [facility];  Service: Cardiovascular;  Laterality: N/A;   Angioplasty  Approx 2122   APPLICATION OF WOUND VAC Bilateral 08/03/2021   Procedure: APPLICATION OF WOUND VAC BILATERAL GROINS;  Surgeon: Paul John, MD;  Location: [place];  Service: Vascular;  Laterality: Bilateral;   CARDIAC CATHETERIZATION  Approximately 1993   CHOLECYSTECTOMY     enchondroma Right 2016   Humerus   FEMORAL ARTERY EXPLORATION Bilateral 08/03/2021   Procedure: BILATERAL FEMORAL ARTERY EXPLORATION WITH BYPASS REVISION;  Surgeon: Paul John, MD;  Location: [place];  Service: Vascular;  Laterality: Bilateral;   FIXATION KYPHOPLASTY     INTRAOPERATIVE ARTERIOGRAM Right 07/20/2021   Procedure: INTRA OPERATIVE ARTERIOGRAM WITH BYPASS ANGIOGRAPHY;  Surgeon: Paul John, MD;  Location: [place];  Service: Vascular;  Laterality: Right;   LUNG CANCER SURGERY  01/2006   Small excision of left lung tissue; mesh with radioactive seeds (per pt report)   ORIF TIBIA FRACTURE Left 08/22/2016   Procedure: OPEN REDUCTION INTERNAL FIXATION (ORIF) TIBIA FRACTURE;  Surgeon: Nicholes Stairs, MD;  Location: [facility];  Service: Orthopedics;  Laterality: Left;    PELVIC ANGIOGRAPHY Left 07/19/2021   Procedure: LEFT COMMON ILIAC PTA;  Surgeon: Paul Hampshire, MD;  Location: [place] [facility];  Service: Cardiovascular;  Laterality: Left;   PERIPHERAL VASCULAR BALLOON ANGIOPLASTY  07/12/2021   Procedure: PERIPHERAL VASCULAR BALLOON ANGIOPLASTY;  Surgeon: Paul Hampshire, MD;  Location: [place] [facility];  Service: Cardiovascular;;  aborted PTA of left common iliac   ROTATOR CUFF TEAR  2016   THROMBECTOMY ILIAC ARTERY Bilateral 07/23/2021   Procedure: THROMBECTOMY BILATERAL ILIAC ARTERY;  Surgeon: Paul John, MD;  Location: [facility];  Service: Vascular;  Laterality: Bilateral;   Tibia/Fibula Repair Left 07/2016   TONSILLECTOMY      Inpatient Medications: Scheduled Meds:  sodium chloride   Intravenous Once   sodium chloride   Intravenous Once   atorvastatin  40 mg Oral Daily   Chlorhexidine Gluconate Cloth  6 each Topical Daily   docusate sodium  100 mg Oral Daily   empagliflozin  25 mg Oral Daily   furosemide  20 mg Oral Daily   gabapentin  1,200 mg Oral QHS   insulin aspart  0-15 Units Subcutaneous TID WC   losartan  50 mg Oral Q breakfast   metFORMIN  1,000 mg Oral BID WC   metoCLOPramide  5 mg Oral TID AC   pantoprazole  40 mg Oral Daily   Continuous Infusions:  sodium chloride     sodium chloride 100 mL/hr at 08/09/2021 1507   heparin 1,650 Units/hr (07/31/21 1014)   magnesium sulfate bolus IVPB     PRN Meds: sodium chloride, acetaminophen **OR** acetaminophen, albuterol, alum & mag hydroxide-simeth, bisacodyl, guaiFENesin-dextromethorphan, hydrALAZINE, labetalol, magnesium sulfate bolus IVPB, morphine injection, ondansetron, oxyCODONE-acetaminophen, phenol, polyethylene glycol, potassium chloride, sodium chloride  Allergies:    Allergies  Allergen Reactions   Codeine Anaphylaxis   Phenobarbital Hypertension    Social History:   Social History   Socioeconomic History   Marital status: Married    Spouse name: Paul [name]   Number  of children: 2   Years of education: college   Highest education level: Not on file  Occupational History    Employer: RETIRED    Comment: Retired from [employer]  Tobacco Use   Smoking status: Every Day    Packs/day: 0.50    Years: 59.00    Pack years: 29.50    Types: Cigarettes   Smokeless tobacco: Never  Vaping Use   Vaping Use: Never used  Substance and Sexual Activity   Alcohol use: Not Currently    Alcohol/week: 1.0 standard drink    Types: 1 Glasses of wine per week   Drug use: Never   Sexual activity: Never  Other Topics Concern   Not on file  Social History Narrative   Patient lives at home with his wife Paul [name]).   Retired.   Education- College    Right handed.   Caffeine- three cups of coffee daily.   Social Determinants of Health   Financial Resource Strain: Not on file  Food Insecurity: Not on file  Transportation Needs: Not on file  Physical Activity: Not on file  Stress: Not  on file  Social Connections: Not on file  Intimate Partner Violence: Not on file    Family History:    Family History  Problem Relation Age of Onset   Heart disease Mother    Tremor Mother    Dementia Mother    Heart Problems Father      ROS:  Please see the history of present illness.  All other ROS reviewed and negative.     Physical Exam/Data:   Vitals:   07/31/21 0034 07/31/21 0334 07/31/21 0801 07/31/21 0952  BP: 104/60 117/64 129/63 139/76  Pulse: 92 (!) 102  77  Resp: 20 (!) 31 (!) 32 (!) 32  Temp: 97.6 F (36.4 C) 98.2 F (36.8 C) 98 F (36.7 C)   TempSrc: Oral Oral Oral   SpO2: 98% 96% 96% 92%  Weight:      Height:        Intake/Output Summary (Last 24 hours) at 07/31/2021 1144 Last data filed at 07/31/2021 0345 Gross per 24 hour  Intake 942.56 ml  Output 685 ml  Net 257.56 ml   Last 3 Weights 07/23/2021 07/25/2021 07/21/2021  Weight (lbs) 194 lb 193 lb 193 lb  Weight (kg) 87.998 kg 87.544 kg 87.544 kg     Body mass index is 29.94 kg/m.  General:   Well nourished, well developed, in no acute distress HEENT: normal Neck: no JVD Vascular: No carotid bruits; Distal pulses 2+ bilaterally Cardiac:  normal S1, S2; RRR; no murmur  Lungs:  faint rales  Abd: soft, nontender, no hepatomegaly  Ext: no edema Musculoskeletal:  No deformities, BUE and BLE strength normal and equal Skin: warm and dry  Neuro:  CNs 2-12 intact, no focal abnormalities noted Psych:  Normal affect   EKG:  The EKG was personally reviewed and demonstrates:  None this admission  Telemetry:  Telemetry was personally reviewed and demonstrates: Atrial fibrillation at rate of 90-140s  Relevant CV Studies:  Echo 07/27/21 1. Left ventricular ejection fraction, by estimation, is 55 to 60%. The  left ventricle has normal function. The left ventricle has no regional  wall motion abnormalities. Left ventricular diastolic function could not  be evaluated. There is the  interventricular septum is flattened in systole, consistent with right  ventricular pressure overload.   2. Right ventricular systolic function is mildly reduced. The right  ventricular size is moderately enlarged. There is moderately elevated  pulmonary artery systolic pressure.   3. Left atrial size was moderately dilated.   4. Right atrial size was mild to moderately dilated.   5. The mitral valve is normal in structure. No evidence of mitral valve  regurgitation.   6. The aortic valve is tricuspid. Aortic valve regurgitation is not  visualized. Mild aortic valve sclerosis is present, with no evidence of  aortic valve stenosis.   7. Aortic dilatation noted. There is mild dilatation of the ascending  aorta, measuring 40 mm. There is mild dilatation of the aortic root,  measuring 39 mm.   8. The inferior vena cava is dilated in size with <50% respiratory  variability, suggesting right atrial pressure of 15 mmHg.   Comparison(s): Prior images reviewed side by side. There is worsened  pulmonary  hypertension, increased right atrial pressure. On direct  comparison, there is little change in RV size and systolic function.   LEFT COMMON ILIAC PTA  07/19/2021   Conclusion  Unsuccessful angioplasty of the left common iliac artery via the antegrade left brachial artery access and left common  femoral artery retrograde access.  I was able to cross the occlusion antegrade with an 018 CTO wire but was not able to track any crossing catheters to cross the occlusion.  Attempted snare of the wire via the left common femoral artery was not successful due to significant tortuosity and ultimately the sheath in the left common femoral artery inadvertently came out and thus the procedure had to be aborted and manual pressure was held.   Recommendations: Recommend evaluation for right to left femoral-femoral bypass given nonhealing ulceration on the left foot. Eliquis can be resumed tomorrow as long as no bleeding issues.  Laboratory Data:  High Sensitivity Troponin:  No results for input(s): TROPONINIHS in the last 720 hours.   Chemistry Recent Labs  Lab 07/30/21 0800 07/30/21 1721 07/31/21 0818  NA 133* 134* 135  K 3.9 3.8 4.0  CL 102 104 102  CO2 20* 21* 20*  GLUCOSE 172* 160* 130*  BUN _0 CREATININE 0.90 0.76 0.87  CALCIUM 7.8* 7.9* 8.1*  GFRNONAA >60 >60 >60  ANIONGAP _1 No results for input(s): PROT, ALBUMIN, AST, ALT, ALKPHOS, BILITOT in the last 168 hours. Lipids  Recent Labs  Lab 08/10/2021 0101  CHOL 66  TRIG 45  HDL 35*  LDLCALC 22  CHOLHDL 1.9    Hematology Recent Labs  Lab 07/30/21 0400 07/30/21 0800 07/31/21 0818  WBC 13.0* 13.7* 12.7*  RBC 2.81* 2.82* 2.92*  HGB 8.1* 8.1* 8.3*  HCT 24.5* 24.6* 26.2*  MCV 87.2 87.2 89.7  MCH 28.8 28.7 28.4  MCHC 33.1 32.9 31.7  RDW 16.8* 16.6* 16.5*  PLT 149* 154 182    Radiology/Studies:  DG Ang/Ext/Uni/Or Left  Result Date: 08/09/2021 CLINICAL DATA:  Bleeding from proximal anastomosis right to left  femoral to femoral bypass graft. EXAM: LEFT ANG/EXT/UNI/ OR COMPARISON:  CTA earlier today. FINDINGS: Intraoperative imaging was obtained with a C-arm. Initial imaging demonstrates irregularity at the level of the right femoral anastomosis of a right to left femoral to femoral bypass graft with proximal graft occlusion. Eventual imaging during the procedure demonstrates a patent graft with good flow from right to left and opacification of native left-sided femoral arteries. No evidence pseudoaneurysm. IMPRESSION: Imaging during vascular surgical procedure to repair a right to left femoral to femoral bypass graft with normally patent graft noted during the procedure with right to left flow. Electronically Signed   By: Aletta Edouard M.D.   On: 08/02/2021 10:53   ECHOCARDIOGRAM COMPLETE  Result Date: 07/27/2021    ECHOCARDIOGRAM REPORT   Patient Name:   GREOGRY GOODWYN Date of Exam: 07/26/2021 Medical Rec #:  [id number]         Height:       67.5 in Accession #:    [id number]        Weight:       193.0 lb Date of Birth:  1941/06/28          BSA:          2.003 m Patient Age:    27 years          BP:           130/72 mmHg Patient Gender: M                 HR:           84 bpm. Exam Location:  [place] Procedure: 2D Echo, Cardiac Doppler and Color Doppler Indications:  I77.810 Dilated aortic root  History:        Patient has prior history of Echocardiogram examinations, most                 recent 07/11/2020. CAD, COPD, Arrythmias:Atrial Fibrillation;                 Risk Factors:Hypertension, Diabetes, Dyslipidemia and Current                 Smoker. Bronchitis. Morbid obesity.  Sonographer:    Diamond Nickel RCS Referring Phys: Lelon Perla  Sonographer Comments: Patient declined the administration of Definity. IMPRESSIONS  1. Left ventricular ejection fraction, by estimation, is 55 to 60%. The left ventricle has normal function. The left ventricle has no regional wall motion abnormalities. Left  ventricular diastolic function could not be evaluated. There is the interventricular septum is flattened in systole, consistent with right ventricular pressure overload.  2. Right ventricular systolic function is mildly reduced. The right ventricular size is moderately enlarged. There is moderately elevated pulmonary artery systolic pressure.  3. Left atrial size was moderately dilated.  4. Right atrial size was mild to moderately dilated.  5. The mitral valve is normal in structure. No evidence of mitral valve regurgitation.  6. The aortic valve is tricuspid. Aortic valve regurgitation is not visualized. Mild aortic valve sclerosis is present, with no evidence of aortic valve stenosis.  7. Aortic dilatation noted. There is mild dilatation of the ascending aorta, measuring 40 mm. There is mild dilatation of the aortic root, measuring 39 mm.  8. The inferior vena cava is dilated in size with <50% respiratory variability, suggesting right atrial pressure of 15 mmHg. Comparison(s): Prior images reviewed side by side. There is worsened pulmonary hypertension, increased right atrial pressure. On direct comparison, there is little change in RV size and systolic function. FINDINGS  Left Ventricle: Left ventricular ejection fraction, by estimation, is 55 to 60%. The left ventricle has normal function. The left ventricle has no regional wall motion abnormalities. The left ventricular internal cavity size was normal in size. There is  no left ventricular hypertrophy. The interventricular septum is flattened in systole, consistent with right ventricular pressure overload. Left ventricular diastolic function could not be evaluated due to atrial fibrillation. Left ventricular diastolic function could not be evaluated. Right Ventricle: The right ventricular size is moderately enlarged. No increase in right ventricular wall thickness. Right ventricular systolic function is mildly reduced. There is moderately elevated pulmonary  artery systolic pressure. The tricuspid regurgitant velocity is 3.18 m/s, and with an assumed right atrial pressure of 15 mmHg, the estimated right ventricular systolic pressure is 78.6 mmHg. Left Atrium: Left atrial size was moderately dilated. Right Atrium: Right atrial size was mild to moderately dilated. Pericardium: There is no evidence of pericardial effusion. Mitral Valve: The mitral valve is normal in structure. No evidence of mitral valve regurgitation. Tricuspid Valve: The tricuspid valve is normal in structure. Tricuspid valve regurgitation is trivial. Aortic Valve: The aortic valve is tricuspid. Aortic valve regurgitation is not visualized. Mild aortic valve sclerosis is present, with no evidence of aortic valve stenosis. Pulmonic Valve: The pulmonic valve was normal in structure. Pulmonic valve regurgitation is mild. Aorta: Aortic dilatation noted. There is mild dilatation of the ascending aorta, measuring 40 mm. There is mild dilatation of the aortic root, measuring 39 mm. Venous: The inferior vena cava is dilated in size with less than 50% respiratory variability, suggesting right atrial pressure of 15 mmHg. IAS/Shunts:  No atrial level shunt detected by color flow Doppler.  LEFT VENTRICLE PLAX 2D LVIDd:         4.00 cm LVIDs:         3.00 cm LV PW:         1.10 cm LV IVS:        1.00 cm LVOT diam:     2.25 cm LV SV:         70 LV SV Index:   35 LVOT Area:     3.98 cm  RIGHT VENTRICLE RV S prime:     12.69 cm/s TAPSE (M-mode): 1.5 cm RVSP:           48.4 mmHg LEFT ATRIUM             Index       RIGHT ATRIUM           Index LA diam:        4.60 cm 2.30 cm/m  RA Pressure: 8.00 mmHg LA Vol (A2C):   68.4 ml 34.15 ml/m RA Area:     21.60 cm LA Vol (A4C):   80.8 ml 40.34 ml/m RA Volume:   65.80 ml  32.85 ml/m LA Biplane Vol: 76.5 ml 38.20 ml/m  AORTIC VALVE LVOT Vmax:   105.33 cm/s LVOT Vmean:  65.033 cm/s LVOT VTI:    0.177 m  AORTA Ao Root diam: 3.90 cm Ao Asc diam:  4.00 cm TRICUSPID VALVE TR Peak  grad:   40.4 mmHg TR Vmax:        318.00 cm/s Estimated RAP:  8.00 mmHg RVSP:           48.4 mmHg  SHUNTS Systemic VTI:  0.18 m Systemic Diam: 2.25 cm Sanda Klein MD Electronically signed by Sanda Klein MD Signature Date/Time: 07/27/2021/2:12:02 PM    Final    CT Angio Abd/Pel w/ and/or w/o  Result Date: 07/22/2021 CLINICAL DATA:  Femoral pseudoaneurysm EXAM: CTA ABDOMEN AND PELVIS WITHOUT AND WITH CONTRAST TECHNIQUE: Multidetector CT imaging of the abdomen and pelvis was performed using the standard protocol during bolus administration of intravenous contrast. Multiplanar reconstructed images and MIPs were obtained and reviewed to evaluate the vascular anatomy. CONTRAST:  142m OMNIPAQUE IOHEXOL 350 MG/ML SOLN COMPARISON:  None. FINDINGS: VASCULAR Aorta: Normal caliber aorta without aneurysm, dissection, vasculitis or significant stenosis. Calcific atherosclerosis. Celiac: Calcific atherosclerosis.  No occlusion or dissection. SMA: Patent without evidence of aneurysm, dissection, vasculitis or significant stenosis. Renals: Both renal arteries are patent without evidence of aneurysm, dissection, vasculitis, fibromuscular dysplasia or significant stenosis. IMA: Patent without evidence of aneurysm, dissection, vasculitis or significant stenosis. Inflow: The left common, internal and external iliac arteries are occluded. Proximal Outflow: There is a massive right inguinal and lower abdominal wall hematoma arising from the right femoral artery. The hematoma measures approximately 13 x 7 cm. There is a large amount of gas near the hematoma. There is a large amount of actively extravasated contrast seen into the hematoma. On the left, there is also a hematoma near the femoral artery, but no active extravasation is seen on the left. Veins: No obvious venous abnormality within the limitations of this arterial phase study. Review of the MIP images confirms the above findings. NON-VASCULAR Lower chest: 2 cm right  basilar nodule Hepatobiliary: No focal liver abnormality is seen. Status post cholecystectomy. No biliary dilatation. Pancreas: Unremarkable. No pancreatic ductal dilatation or surrounding inflammatory changes. Spleen: Normal in size without focal abnormality. 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 Kidneys are unremarkable. Stomach/Bowel: Stomach is within normal limits. Appendix appears normal. No evidence of bowel wall thickening, distention, or inflammatory changes. Lymphatic: Aortic atherosclerosis. No enlarged abdominal or pelvic lymph nodes. Reproductive: Foley catheter in the urinary bladder. Prostate unremarkable. Other: No abdominal wall hernia or abnormality. No abdominopelvic ascites. Musculoskeletal: Remote vertebral augmentation at T12 and L1. IMPRESSION: 1. Massive right inguinal and lower abdominal wall hematoma arising from the right femoral artery, with large amount of actively extravasated contrast. 2. Left common, internal and external iliac arteries are occluded. 3. 2 cm right basilar pulmonary nodule. Consider one of the following in 3 months for both low-risk and high-risk individuals: (a) repeat chest CT, (b) follow-up PET-CT, or (c) tissue sampling. This recommendation follows the consensus statement: Guidelines for Management of Incidental Pulmonary Nodules Detected on CT Images: From the Fleischner Society 2017; Radiology 2017; 284:228-243. Aortic Atherosclerosis (ICD10-I70.0). Critical Value/emergent results were called by telephone at the time of interpretation on 08/10/2021 at 1:17 am to provider [facility] , who verbally acknowledged these results. Electronically Signed   By: Ulyses Jarred M.D.   On: 07/16/2021 01:18     Assessment and Plan:   Permanent atrial fibrillation with transient tachycardia -His heart rate was fairly controlled since  admission.  He has transient tachycardia with movement.  This is due to deconditioning.  During my evaluation his heart rate came down to baseline of 90s.  We will add cardizem 67m q 6 hours>> transition to long acting tomorrow.  -Continue heparin for anticoagulation.  Transition to Eliquis when no bleeding issue.  2.  Hypertension -Blood pressure stable on losartan  3. Acute dyspnea - Severe SOB with movement. Pending chest x-ray. Give IV lasix 454mx 1.    Risk Assessment/Risk Scores:   CHA2DS2-VASc Score = 5   This indicates a 7.2% annual risk of stroke. The patient's score is based upon: CHF History: 0 HTN History: 1 Diabetes History: 1 Stroke History: 0 Vascular Disease History: 1 Age Score: 2 Gender Score: 0  For questions or updates, please contact [email or web address] Please consult [email or web address] for contact info under    SiJarrett SohoPA  07/31/2021 11:44 AM    I have examined the patient and reviewed assessment and plan and discussed with patient.  Agree with above as stated.  AFib with RVR.  Will start diltiazem for rate control.  Will also give a dose if IV Lasix as he may have some volume overload contributing to wheezing noted on exam.  RVR will also exacerbate diastolic dysfunction. Will follow.  [place]

## 2021-07-31 NOTE — Progress Notes (Addendum)
     Called by RN patient with tachycardia/A fib rate 117-124 will give Labetalol 39m for rate control above 110 bpm.  I called cardiology to access patient and make recommendations.  Albuterol nebulizer given for SOB.  This has improved. Will get respiratory consult Chest x ray ordered   ERoxy HorsemanPA-C

## 2021-08-01 ENCOUNTER — Encounter (HOSPITAL_COMMUNITY): Payer: Self-pay | Admitting: Vascular Surgery

## 2021-08-01 ENCOUNTER — Inpatient Hospital Stay (HOSPITAL_COMMUNITY): Payer: Medicare Other

## 2021-08-01 ENCOUNTER — Inpatient Hospital Stay (HOSPITAL_COMMUNITY): Payer: Medicare Other | Admitting: Anesthesiology

## 2021-08-01 ENCOUNTER — Encounter (HOSPITAL_COMMUNITY): Admission: RE | Disposition: E | Payer: Self-pay | Source: Home / Self Care | Attending: Vascular Surgery

## 2021-08-01 ENCOUNTER — Other Ambulatory Visit (HOSPITAL_COMMUNITY): Payer: Medicare Other

## 2021-08-01 DIAGNOSIS — Z66 Do not resuscitate: Secondary | ICD-10-CM | POA: Diagnosis not present

## 2021-08-01 DIAGNOSIS — I4821 Permanent atrial fibrillation: Secondary | ICD-10-CM | POA: Diagnosis not present

## 2021-08-01 DIAGNOSIS — G9341 Metabolic encephalopathy: Secondary | ICD-10-CM | POA: Diagnosis not present

## 2021-08-01 DIAGNOSIS — I739 Peripheral vascular disease, unspecified: Secondary | ICD-10-CM | POA: Diagnosis not present

## 2021-08-01 DIAGNOSIS — Z419 Encounter for procedure for purposes other than remedying health state, unspecified: Secondary | ICD-10-CM | POA: Diagnosis not present

## 2021-08-01 DIAGNOSIS — T82868A Thrombosis of vascular prosthetic devices, implants and grafts, initial encounter: Secondary | ICD-10-CM

## 2021-08-01 DIAGNOSIS — E1151 Type 2 diabetes mellitus with diabetic peripheral angiopathy without gangrene: Secondary | ICD-10-CM | POA: Diagnosis not present

## 2021-08-01 DIAGNOSIS — Z515 Encounter for palliative care: Secondary | ICD-10-CM | POA: Diagnosis not present

## 2021-08-01 HISTORY — PX: FEMORAL-FEMORAL BYPASS GRAFT: SHX936

## 2021-08-01 LAB — POCT I-STAT 7, (LYTES, BLD GAS, ICA,H+H)
Acid-base deficit: 4 mmol/L — ABNORMAL HIGH (ref 0.0–2.0)
Acid-base deficit: 2 mmol/L (ref 0.0–2.0)
Acid-base deficit: 4 mmol/L — ABNORMAL HIGH (ref 0.0–2.0)
Acid-base deficit: 2 mmol/L (ref 0.0–2.0)
Bicarbonate: 23.2 mmol/L (ref 20.0–28.0)
Bicarbonate: 24.9 mmol/L (ref 20.0–28.0)
Bicarbonate: 23.2 mmol/L (ref 20.0–28.0)
Bicarbonate: 23.3 mmol/L (ref 20.0–28.0)
Calcium, Ion: 1.15 mmol/L (ref 1.15–1.40)
Calcium, Ion: 1.09 mmol/L — ABNORMAL LOW (ref 1.15–1.40)
Calcium, Ion: 1.24 mmol/L (ref 1.15–1.40)
Calcium, Ion: 1.19 mmol/L (ref 1.15–1.40)
HCT: 23 % — ABNORMAL LOW (ref 39.0–52.0)
HCT: 22 % — ABNORMAL LOW (ref 39.0–52.0)
HCT: 21 % — ABNORMAL LOW (ref 39.0–52.0)
HCT: 22 % — ABNORMAL LOW (ref 39.0–52.0)
Hemoglobin: 7.8 g/dL — ABNORMAL LOW (ref 13.0–17.0)
Hemoglobin: 7.5 g/dL — ABNORMAL LOW (ref 13.0–17.0)
Hemoglobin: 7.1 g/dL — ABNORMAL LOW (ref 13.0–17.0)
Hemoglobin: 7.5 g/dL — ABNORMAL LOW (ref 13.0–17.0)
O2 Saturation: 99 %
O2 Saturation: 100 %
O2 Saturation: 100 %
O2 Saturation: 89 %
Patient temperature: 37.4
Patient temperature: 36.2
Patient temperature: 98
Potassium: 3.6 mmol/L (ref 3.5–5.1)
Potassium: 3.4 mmol/L — ABNORMAL LOW (ref 3.5–5.1)
Potassium: 3.1 mmol/L — ABNORMAL LOW (ref 3.5–5.1)
Potassium: 3.4 mmol/L — ABNORMAL LOW (ref 3.5–5.1)
Sodium: 137 mmol/L (ref 135–145)
Sodium: 140 mmol/L (ref 135–145)
Sodium: 140 mmol/L (ref 135–145)
Sodium: 141 mmol/L (ref 135–145)
TCO2: 25 mmol/L (ref 22–32)
TCO2: 27 mmol/L (ref 22–32)
TCO2: 25 mmol/L (ref 22–32)
TCO2: 24 mmol/L (ref 22–32)
pCO2 arterial: 57.5 mmHg — ABNORMAL HIGH (ref 32.0–48.0)
pCO2 arterial: 56.2 mmHg — ABNORMAL HIGH (ref 32.0–48.0)
pCO2 arterial: 52.7 mmHg — ABNORMAL HIGH (ref 32.0–48.0)
pCO2 arterial: 39.5 mmHg (ref 32.0–48.0)
pH, Arterial: 7.217 — ABNORMAL LOW (ref 7.350–7.450)
pH, Arterial: 7.254 — ABNORMAL LOW (ref 7.350–7.450)
pH, Arterial: 7.247 — ABNORMAL LOW (ref 7.350–7.450)
pH, Arterial: 7.377 (ref 7.350–7.450)
pO2, Arterial: 167 mmHg — ABNORMAL HIGH (ref 83.0–108.0)
pO2, Arterial: 208 mmHg — ABNORMAL HIGH (ref 83.0–108.0)
pO2, Arterial: 205 mmHg — ABNORMAL HIGH (ref 83.0–108.0)
pO2, Arterial: 56 mmHg — ABNORMAL LOW (ref 83.0–108.0)

## 2021-08-01 LAB — GLUCOSE, CAPILLARY
Glucose-Capillary: 145 mg/dL — ABNORMAL HIGH (ref 70–99)
Glucose-Capillary: 106 mg/dL — ABNORMAL HIGH (ref 70–99)
Glucose-Capillary: 61 mg/dL — ABNORMAL LOW (ref 70–99)
Glucose-Capillary: 64 mg/dL — ABNORMAL LOW (ref 70–99)
Glucose-Capillary: 145 mg/dL — ABNORMAL HIGH (ref 70–99)
Glucose-Capillary: 133 mg/dL — ABNORMAL HIGH (ref 70–99)

## 2021-08-01 LAB — CBC
HCT: 25.8 % — ABNORMAL LOW (ref 39.0–52.0)
HCT: 25.9 % — ABNORMAL LOW (ref 39.0–52.0)
HCT: 23.3 % — ABNORMAL LOW (ref 39.0–52.0)
Hemoglobin: 8.4 g/dL — ABNORMAL LOW (ref 13.0–17.0)
Hemoglobin: 8.1 g/dL — ABNORMAL LOW (ref 13.0–17.0)
Hemoglobin: 7.4 g/dL — ABNORMAL LOW (ref 13.0–17.0)
MCH: 28.8 pg (ref 26.0–34.0)
MCH: 28.1 pg (ref 26.0–34.0)
MCH: 28.9 pg (ref 26.0–34.0)
MCHC: 32.6 g/dL (ref 30.0–36.0)
MCHC: 31.3 g/dL (ref 30.0–36.0)
MCHC: 31.8 g/dL (ref 30.0–36.0)
MCV: 88.4 fL (ref 80.0–100.0)
MCV: 89.9 fL (ref 80.0–100.0)
MCV: 91 fL (ref 80.0–100.0)
Platelets: 236 10*3/uL (ref 150–400)
Platelets: 225 10*3/uL (ref 150–400)
Platelets: 248 10*3/uL (ref 150–400)
RBC: 2.92 MIL/uL — ABNORMAL LOW (ref 4.22–5.81)
RBC: 2.88 MIL/uL — ABNORMAL LOW (ref 4.22–5.81)
RBC: 2.56 MIL/uL — ABNORMAL LOW (ref 4.22–5.81)
RDW: 16.4 % — ABNORMAL HIGH (ref 11.5–15.5)
RDW: 16.5 % — ABNORMAL HIGH (ref 11.5–15.5)
RDW: 16.6 % — ABNORMAL HIGH (ref 11.5–15.5)
WBC: 14.6 10*3/uL — ABNORMAL HIGH (ref 4.0–10.5)
WBC: 12.5 10*3/uL — ABNORMAL HIGH (ref 4.0–10.5)
WBC: 14.9 10*3/uL — ABNORMAL HIGH (ref 4.0–10.5)
nRBC: 0.6 % — ABNORMAL HIGH (ref 0.0–0.2)
nRBC: 0.5 % — ABNORMAL HIGH (ref 0.0–0.2)
nRBC: 2 % — ABNORMAL HIGH (ref 0.0–0.2)

## 2021-08-01 LAB — BASIC METABOLIC PANEL
Anion gap: 12 (ref 5–15)
BUN: 21 mg/dL (ref 8–23)
CO2: 21 mmol/L — ABNORMAL LOW (ref 22–32)
Calcium: 8 mg/dL — ABNORMAL LOW (ref 8.9–10.3)
Chloride: 106 mmol/L (ref 98–111)
Creatinine, Ser: 0.89 mg/dL (ref 0.61–1.24)
GFR, Estimated: >60 mL/min (ref >60)
Glucose, Bld: 135 mg/dL — ABNORMAL HIGH (ref 70–99)
Potassium: 3.2 mmol/L — ABNORMAL LOW (ref 3.5–5.1)
Sodium: 139 mmol/L (ref 135–145)

## 2021-08-01 LAB — HEPARIN LEVEL (UNFRACTIONATED): Heparin Unfractionated: 0.32 IU/mL (ref 0.30–0.70)

## 2021-08-01 LAB — MAGNESIUM: Magnesium: 1.8 mg/dL (ref 1.7–2.4)

## 2021-08-01 LAB — PREPARE RBC (CROSSMATCH)

## 2021-08-01 SURGERY — CREATION, BYPASS, ARTERIAL, FEMORAL TO FEMORAL, USING GRAFT
Anesthesia: General | Site: Groin

## 2021-08-01 MED ORDER — HEPARIN SODIUM (PORCINE) 1000 UNIT/ML IJ SOLN
INTRAMUSCULAR | Status: AC
  Filled 2021-08-01: qty 1

## 2021-08-01 MED ORDER — DEXTROSE 50 % IV SOLN: 1.0000 | Freq: Once | INTRAVENOUS | Status: DC

## 2021-08-01 MED ORDER — SODIUM CHLORIDE 0.9% IV SOLUTION: Freq: Once | INTRAVENOUS | Status: DC

## 2021-08-01 MED ORDER — HEPARIN (PORCINE) 25000 UT/250ML-% IV SOLN
1650.0000 [IU]/h | INTRAVENOUS | Status: DC
  Administered 2021-08-01 – 2021-08-03 (×3): 1650 [IU]/h via INTRAVENOUS
  Filled 2021-08-01 (×4): qty 250

## 2021-08-01 MED ORDER — ALBUMIN HUMAN 5 % IV SOLN: INTRAVENOUS | Status: DC | PRN

## 2021-08-01 MED ORDER — FENTANYL CITRATE (PF) 100 MCG/2ML IJ SOLN: 25.0000 ug | Freq: Once | INTRAMUSCULAR | Status: AC

## 2021-08-01 MED ORDER — SODIUM CHLORIDE 0.9 % IV SOLN
250.0000 mL | INTRAVENOUS | Status: DC
  Administered 2021-08-05: 250 mL via INTRAVENOUS

## 2021-08-01 MED ORDER — ETOMIDATE 2 MG/ML IV SOLN
INTRAVENOUS | Status: DC | PRN
  Administered 2021-08-01: 20 mg via INTRAVENOUS

## 2021-08-01 MED ORDER — PROPOFOL 500 MG/50ML IV EMUL
INTRAVENOUS | Status: DC | PRN
  Administered 2021-08-01: 25 ug/kg/min via INTRAVENOUS

## 2021-08-01 MED ORDER — SODIUM CHLORIDE 0.9 % IV SOLN: INTRAVENOUS | Status: DC

## 2021-08-01 MED ORDER — PHENYLEPHRINE HCL-NACL 20-0.9 MG/250ML-% IV SOLN
0.0000 ug/min | INTRAVENOUS | Status: DC
  Administered 2021-08-01: 100 ug/min via INTRAVENOUS
  Administered 2021-08-01: 180 ug/min via INTRAVENOUS
  Administered 2021-08-01: 160 ug/min via INTRAVENOUS
  Administered 2021-08-02: 100 ug/min via INTRAVENOUS
  Administered 2021-08-02: 5 ug/min via INTRAVENOUS
  Administered 2021-08-02: 130 ug/min via INTRAVENOUS
  Administered 2021-08-04: 35 ug/min via INTRAVENOUS
  Administered 2021-08-04: 90 ug/min via INTRAVENOUS
  Administered 2021-08-04: 40 ug/min via INTRAVENOUS
  Administered 2021-08-05: 125 ug/min via INTRAVENOUS
  Administered 2021-08-05: 300 ug/min via INTRAVENOUS
  Administered 2021-08-05: 90 ug/min via INTRAVENOUS
  Administered 2021-08-05: 95 ug/min via INTRAVENOUS
  Administered 2021-08-05: 335 ug/min via INTRAVENOUS
  Administered 2021-08-05: 300 ug/min via INTRAVENOUS
  Filled 2021-08-01 (×2): qty 500
  Filled 2021-08-01 (×8): qty 250
  Filled 2021-08-01: qty 500
  Filled 2021-08-01: qty 250
  Filled 2021-08-01: qty 500
  Filled 2021-08-01 (×3): qty 250

## 2021-08-01 MED ORDER — CEFAZOLIN SODIUM-DEXTROSE 2-4 GM/100ML-% IV SOLN
INTRAVENOUS | Status: AC
  Filled 2021-08-01: qty 100

## 2021-08-01 MED ORDER — FENTANYL 2500MCG IN NS 250ML (10MCG/ML) PREMIX INFUSION
25.0000 ug/h | INTRAVENOUS | Status: DC
  Administered 2021-08-01: 50 ug/h via INTRAVENOUS
  Administered 2021-08-03: 25 ug/h via INTRAVENOUS
  Administered 2021-08-05: 200 ug/h via INTRAVENOUS
  Administered 2021-08-05: 75 ug/h via INTRAVENOUS
  Administered 2021-08-06 – 2021-08-07 (×2): 150 ug/h via INTRAVENOUS
  Filled 2021-08-01 (×7): qty 250

## 2021-08-01 MED ORDER — VASOPRESSIN 20 UNIT/ML IV SOLN
INTRAVENOUS | Status: DC | PRN
  Administered 2021-08-01 (×4): 1 [IU] via INTRAVENOUS

## 2021-08-01 MED ORDER — HEMOSTATIC AGENTS (NO CHARGE) OPTIME
TOPICAL | Status: DC | PRN
  Administered 2021-08-01: 1 via TOPICAL

## 2021-08-01 MED ORDER — FENTANYL BOLUS VIA INFUSION
25.0000 ug | INTRAVENOUS | Status: DC | PRN
  Administered 2021-08-01 – 2021-08-04 (×5): 25 ug via INTRAVENOUS
  Filled 2021-08-01: qty 100

## 2021-08-01 MED ORDER — SODIUM BICARBONATE 8.4 % IV SOLN
INTRAVENOUS | Status: AC
  Filled 2021-08-01: qty 50

## 2021-08-01 MED ORDER — PROPOFOL 1000 MG/100ML IV EMUL
INTRAVENOUS | Status: AC
  Filled 2021-08-01: qty 100

## 2021-08-01 MED ORDER — SODIUM BICARBONATE 8.4 % IV SOLN
INTRAVENOUS | Status: DC | PRN
  Administered 2021-08-01 (×2): 50 meq via INTRAVENOUS

## 2021-08-01 MED ORDER — ROCURONIUM BROMIDE 100 MG/10ML IV SOLN
INTRAVENOUS | Status: DC | PRN
  Administered 2021-08-01: 30 mg via INTRAVENOUS
  Administered 2021-08-01: 20 mg via INTRAVENOUS

## 2021-08-01 MED ORDER — PROTAMINE SULFATE 10 MG/ML IV SOLN
INTRAVENOUS | Status: AC
  Filled 2021-08-01: qty 5

## 2021-08-01 MED ORDER — CHLORHEXIDINE GLUCONATE 0.12 % MT SOLN
OROMUCOSAL | Status: AC
  Administered 2021-08-01: 15 mL via OROMUCOSAL
  Filled 2021-08-01: qty 15

## 2021-08-01 MED ORDER — ETOMIDATE 2 MG/ML IV SOLN
INTRAVENOUS | Status: AC
  Filled 2021-08-01: qty 10

## 2021-08-01 MED ORDER — GABAPENTIN 600 MG PO TABS
600.0000 mg | ORAL_TABLET | Freq: Every day | ORAL | Status: DC
  Administered 2021-08-01: 600 mg via ORAL
  Filled 2021-08-01: qty 1

## 2021-08-01 MED ORDER — MIDAZOLAM HCL 2 MG/2ML IJ SOLN
INTRAMUSCULAR | Status: AC
  Filled 2021-08-01: qty 2

## 2021-08-01 MED ORDER — DEXTROSE 50 % IV SOLN
12.5000 g | INTRAVENOUS | Status: AC
  Administered 2021-08-01: 12.5 g via INTRAVENOUS
  Filled 2021-08-01: qty 50

## 2021-08-01 MED ORDER — CALCIUM CHLORIDE 10 % IV SOLN
INTRAVENOUS | Status: DC | PRN
  Administered 2021-08-01 (×4): 200 mg via INTRAVENOUS

## 2021-08-01 MED ORDER — HEPARIN 6000 UNIT IRRIGATION SOLUTION
Status: AC
  Filled 2021-08-01: qty 500

## 2021-08-01 MED ORDER — FENTANYL CITRATE (PF) 250 MCG/5ML IJ SOLN
INTRAMUSCULAR | Status: DC | PRN
  Administered 2021-08-01: 100 ug via INTRAVENOUS

## 2021-08-01 MED ORDER — HEPARIN 6000 UNIT IRRIGATION SOLUTION
Status: DC | PRN
  Administered 2021-08-01: 1

## 2021-08-01 MED ORDER — PROPOFOL 10 MG/ML IV BOLUS
INTRAVENOUS | Status: AC
  Filled 2021-08-01: qty 20

## 2021-08-01 MED ORDER — EPHEDRINE 5 MG/ML INJ
INTRAVENOUS | Status: AC
  Filled 2021-08-01: qty 5

## 2021-08-01 MED ORDER — PHENYLEPHRINE HCL-NACL 20-0.9 MG/250ML-% IV SOLN
INTRAVENOUS | Status: DC | PRN
  Administered 2021-08-01: 100 ug/min via INTRAVENOUS

## 2021-08-01 MED ORDER — HEPARIN SODIUM (PORCINE) 1000 UNIT/ML IJ SOLN
INTRAMUSCULAR | Status: DC | PRN
  Administered 2021-08-01: 9000 [IU] via INTRAVENOUS

## 2021-08-01 MED ORDER — LIDOCAINE 2% (20 MG/ML) 5 ML SYRINGE
INTRAMUSCULAR | Status: DC | PRN
  Administered 2021-08-01: 40 mg via INTRAVENOUS

## 2021-08-01 MED ORDER — EPHEDRINE SULFATE-NACL 50-0.9 MG/10ML-% IV SOSY
PREFILLED_SYRINGE | INTRAVENOUS | Status: DC | PRN
  Administered 2021-08-01: 10 mg via INTRAVENOUS
  Administered 2021-08-01 (×3): 5 mg via INTRAVENOUS

## 2021-08-01 MED ORDER — FENTANYL CITRATE (PF) 250 MCG/5ML IJ SOLN
INTRAMUSCULAR | Status: AC
  Filled 2021-08-01: qty 5

## 2021-08-01 MED ORDER — POTASSIUM CHLORIDE 20 MEQ PO PACK
40.0000 meq | PACK | Freq: Once | ORAL | Status: AC
  Administered 2021-08-01: 40 meq
  Filled 2021-08-01: qty 2

## 2021-08-01 MED ORDER — CEFAZOLIN SODIUM-DEXTROSE 2-4 GM/100ML-% IV SOLN
2.0000 g | Freq: Once | INTRAVENOUS | Status: AC
  Administered 2021-08-01: 2 g via INTRAVENOUS

## 2021-08-01 MED ORDER — 0.9 % SODIUM CHLORIDE (POUR BTL) OPTIME
TOPICAL | Status: DC | PRN
  Administered 2021-08-01 (×2): 1000 mL

## 2021-08-01 MED ORDER — SUCCINYLCHOLINE CHLORIDE 200 MG/10ML IV SOSY
PREFILLED_SYRINGE | INTRAVENOUS | Status: DC | PRN
  Administered 2021-08-01: 120 mg via INTRAVENOUS

## 2021-08-01 MED ORDER — MAGNESIUM OXIDE -MG SUPPLEMENT 400 (240 MG) MG PO TABS
400.0000 mg | ORAL_TABLET | Freq: Once | ORAL | Status: AC
  Administered 2021-08-01: 400 mg via NASOGASTRIC
  Filled 2021-08-01: qty 1

## 2021-08-01 MED ORDER — PROTAMINE SULFATE 10 MG/ML IV SOLN
INTRAVENOUS | Status: DC | PRN
  Administered 2021-08-01: 50 mg via INTRAVENOUS

## 2021-08-01 MED ORDER — LACTATED RINGERS IV SOLN: INTRAVENOUS | Status: DC

## 2021-08-01 MED ORDER — ORAL CARE MOUTH RINSE: 15.0000 mL | Freq: Once | OROMUCOSAL | Status: AC

## 2021-08-01 MED ORDER — PAPAVERINE HCL 30 MG/ML IJ SOLN
INTRAMUSCULAR | Status: AC
  Filled 2021-08-01: qty 2

## 2021-08-01 MED ORDER — CHLORHEXIDINE GLUCONATE 0.12 % MT SOLN: 15.0000 mL | Freq: Once | OROMUCOSAL | Status: AC

## 2021-08-01 SURGICAL SUPPLY — 40 items
AGENT HMST KT MTR STRL THRMB (HEMOSTASIS) ×1
BAG COUNTER SPONGE SURGICOUNT (BAG) ×2 IMPLANT
BAG SPNG CNTER NS LX DISP (BAG) ×1
CANISTER SUCT 3000ML PPV (MISCELLANEOUS) ×2 IMPLANT
CANNULA VESSEL 3MM 2 BLNT TIP (CANNULA) ×4 IMPLANT
CATH EMB 4FR 80CM (CATHETERS) ×1 IMPLANT
CLIP VESOCCLUDE MED 24/CT (CLIP) ×2 IMPLANT
CLIP VESOCCLUDE SM WIDE 24/CT (CLIP) ×2 IMPLANT
ELECT REM PT RETURN 9FT ADLT (ELECTROSURGICAL) ×2
ELECTRODE REM PT RTRN 9FT ADLT (ELECTROSURGICAL) ×1 IMPLANT
GLOVE SRG 8 PF TXTR STRL LF DI (GLOVE) ×1 IMPLANT
GLOVE SURG ENC MOIS LTX SZ7.5 (GLOVE) ×2 IMPLANT
GLOVE SURG UNDER POLY LF SZ8 (GLOVE) ×2
GOWN STRL REUS W/ TWL LRG LVL3 (GOWN DISPOSABLE) ×3 IMPLANT
GOWN STRL REUS W/TWL LRG LVL3 (GOWN DISPOSABLE) ×6
GRAFT PROPATEN W/RING 8X80X70 (Vascular Products) ×1 IMPLANT
INSERT FOGARTY SM (MISCELLANEOUS) ×1 IMPLANT
KIT BASIN OR (CUSTOM PROCEDURE TRAY) ×2 IMPLANT
KIT TURNOVER KIT B (KITS) ×2 IMPLANT
NS IRRIG 1000ML POUR BTL (IV SOLUTION) ×4 IMPLANT
PACK PERIPHERAL VASCULAR (CUSTOM PROCEDURE TRAY) ×2 IMPLANT
PAD ABD 8X10 STRL (GAUZE/BANDAGES/DRESSINGS) ×1 IMPLANT
PAD ARMBOARD 7.5X6 YLW CONV (MISCELLANEOUS) ×4 IMPLANT
SPONGE SURGIFOAM ABS GEL 100 (HEMOSTASIS) IMPLANT
SPONGE T-LAP 18X18 ~~LOC~~+RFID (SPONGE) ×3 IMPLANT
SURGIFLO W/THROMBIN 8M KIT (HEMOSTASIS) ×1 IMPLANT
SUT ETHIBOND 5 LR DA (SUTURE) IMPLANT
SUT MNCRL AB 4-0 PS2 18 (SUTURE) ×4 IMPLANT
SUT PROLENE 5 0 C 1 24 (SUTURE) ×4 IMPLANT
SUT PROLENE 6 0 BV (SUTURE) ×5 IMPLANT
SUT SILK 2 0 PERMA HAND 18 BK (SUTURE) IMPLANT
SUT SILK 2 0 SH (SUTURE) ×2 IMPLANT
SUT VIC AB 2-0 CT1 27 (SUTURE) ×2
SUT VIC AB 2-0 CT1 TAPERPNT 27 (SUTURE) IMPLANT
SUT VIC AB 2-0 CTB1 (SUTURE) ×5 IMPLANT
SUT VIC AB 3-0 SH 27 (SUTURE) ×4
SUT VIC AB 3-0 SH 27X BRD (SUTURE) ×2 IMPLANT
TAPE UMBILICAL 1/8X30 (MISCELLANEOUS) ×1 IMPLANT
TOWEL GREEN STERILE (TOWEL DISPOSABLE) ×2 IMPLANT
WATER STERILE IRR 1000ML POUR (IV SOLUTION) ×2 IMPLANT

## 2021-08-01 NOTE — Op Note (Signed)
    NAME: Paul [name]    MRN: [id number] DOB: 11-Apr-1941    DATE OF OPERATION: 08/04/2021  PREOP DIAGNOSIS:    Occluded femoral-femoral bypass graft  POSTOP DIAGNOSIS:    Same  PROCEDURE:    Redo right to left femoral-femoral bypass (8 mm externally supported PTFE graft)  SURGEON: Judeth Cornfield. Scot Dock, MD  ASSIST: Orlie Pollen, MD, Laurence Slate, PA  ANESTHESIA: General  EBL: 50 cc  INDICATIONS:    Paul [name] is a 80 y.o. adult who had undergone a right to left femoral-femoral graft.  He had disruption of the right femoral anastomosis and required thrombectomy.  His graft subsequently occluded.  CT scan showed kinking in the graft and he presents for a redo bypass.  FINDINGS:   Excellent anterior tibial and posterior tibial signal bilaterally at the completion.  TECHNIQUE:   Patient was taken to the operating room and received a general anesthetic.  The lower abdomen groins and thigh were prepped and draped in usual sterile fashion.  Both groins were opened and the previous bypass graft was exposed.  The common femoral, deep femoral, and superficial femoral arteries were controlled bilaterally.  The plan was to place a new 8 mm PTFE graft with external rings to prevent kinking and tunnel it closer to the fascia.  A tunnel was created in an inverted U fashion and the 8 mm graft was brought through the tunnel.  Patient was then heparinized.  Attention was first turned to the right femoral anastomosis.  The old graft was divided.  This was thrombosed.  The entire graft was taken off of the right common femoral artery.  The right limb of the PTFE graft was spatulated and sewn end-to-side to the common femoral artery using continuous 6-0 Prolene suture.  Prior to completing anastomosis the artery was backbled and flushed appropriately and the anastomosis completed.  Flow was reestablished to the right leg.  The graft was clamped.  On the left side the common femoral, deep  femoral, and superficial femoral arteries were controlled.  The graft was removed entirely from the common femoral artery.  I did extend the arteriotomy onto the superficial femoral artery slightly.  The left limb of the graft was spatulated and sewn into side to the common femoral artery extending slightly onto the superficial femoral artery with continuous 6-0 Prolene suture.  At the completion there was good Doppler signals in both feet and anterior tibial and posterior tibial positions.  Hemostasis was obtained in both wounds.  I did close the fascial layer over the graft in both groins with 2-0 Vicryl.  The deep layer was placed with 2-0 Vicryl.  The subcutaneous layer was closed with 3-0 Vicryl bilaterally and the skin was closed with 4-0 Monocryl.  Sterile dressing was applied.  Patient tolerated procedure well.  All needle and sponge counts were correct.  Patient was transferred to the recovery room on the ventilator in stable condition.  Given the complexity of the case a first assistant was necessary in order to expedient the procedure and safely perform the technical aspects of the operation.  Deitra Mayo, MD, FACS [facility]  DATE OF DICTATION:   08/03/2021

## 2021-08-01 NOTE — Progress Notes (Signed)
ANTICOAGULATION CONSULT NOTE   Pharmacy Consult for heparin Indication: bypass patency  Allergies  Allergen Reactions   Codeine Anaphylaxis   Phenobarbital Hypertension    Patient Measurements: Height: 5' 7.5" (171.5 cm) Weight: 95.7 kg (210 lb 15.7 oz) IBW/kg (Calculated) : 67.25 HEPARIN DW (KG): 85.2   Vital Signs: Temp: 98 F (36.7 C) (09/20 0450) Temp Source: Axillary (09/20 0450) BP: 111/58 (09/20 0451) Pulse Rate: 76 (09/20 0450)  Labs: Recent Labs    07/30/21 0800 07/30/21 1300 07/30/21 1721 07/30/21 2237 07/31/21 0818 07/31/21 1627 07/31/21 1835 08/02/2021 0106  HGB 8.1*  --   --   --  8.3*  --   --  8.4*  HCT 24.6*  --   --   --  26.2*  --   --  25.8*  PLT 154  --   --   --  182  --   --  236  APTT 49*  --   --   --   --   --   --   --   HEPARINUNFRC  --    < >  --    < > 0.37 0.36  --  0.32  CREATININE 0.90  --  0.76  --  0.87  --  0.90  --    < > = values in this interval not displayed.     Estimated Creatinine Clearance (by C-G formula based on SCr of 0.9 mg/dL) Male: 58.8 mL/min Male: 71.7 mL/min  Assessment: 47 male s/p right to left femoral-femoral bypass on 9/16. Pharmacy consulted to dose heparin for bypass patency. He is noted on apixaban PTA (last dose taken 07/17/21). Apixaban unlikely to still be affecting heparin level.  Heparin level is therapeutic at 0.32, on 1650 units/hr. Hgb 8.4, plt 236. No s/sx of bleeding or infusion issues.   Goal of Therapy:  Heparin level 0.3-0.7 units/ml Monitor platelets by anticoagulation protocol: Yes   Plan:  Continue IV heparin at current rate. Daily heparin level and CBC. F/u plans to resume Eliquis eventually.  Antonietta Jewel, PharmD, [place] Clinical Pharmacist  Phone: [phone] 07/25/2021 7:50 AM  Please check AMION for all [place] phone numbers After 10:00 PM, call [place] [phone]

## 2021-08-01 NOTE — Progress Notes (Signed)
ANTICOAGULATION CONSULT NOTE   Pharmacy Consult for heparin Indication: bypass patency  Allergies  Allergen Reactions   Codeine Anaphylaxis   Phenobarbital Hypertension    Patient Measurements: Height: 5' 7.5" (171.5 cm) Weight: 95.7 kg (210 lb 15.7 oz) IBW/kg (Calculated) : 67.25 HEPARIN DW (KG): 87.6   Vital Signs: Temp: 98.7 F (37.1 C) (09/20 1053) Temp Source: Oral (09/20 1053) BP: 128/70 (09/20 1053) Pulse Rate: 90 (09/20 1053)  Labs: Recent Labs    07/30/21 0800 07/30/21 1300 07/30/21 1721 07/30/21 2237 07/31/21 0818 07/31/21 1627 07/31/21 1835 07/31/2021 0106 08/10/2021 0710  HGB 8.1*  --   --   --  8.3*  --   --  8.4* 8.1*  HCT 24.6*  --   --   --  26.2*  --   --  25.8* 25.9*  PLT 154  --   --   --  182  --   --  236 225  APTT 49*  --   --   --   --   --   --   --   --   HEPARINUNFRC  --    < >  --    < > 0.37 0.36  --  0.32  --   CREATININE 0.90  --  0.76  --  0.87  --  0.90  --   --    < > = values in this interval not displayed.     Estimated Creatinine Clearance (by C-G formula based on SCr of 0.9 mg/dL) Male: 58.8 mL/min Male: 71.7 mL/min  Assessment: 55 male s/p right to left femoral-femoral bypass on 9/16. Pharmacy consulted to dose heparin for bypass patency. He is noted on apixaban PTA (last dose taken 07/17/21). Apixaban unlikely to still be affecting heparin level.  Patient s/p redo of L femoral-femoral bypass, pharmacy consulted to resume heparin at 2300 on 9/20.   Goal of Therapy:  Heparin level 0.3-0.7 units/ml Monitor platelets by anticoagulation protocol: Yes   Plan:  Resume heparin at 1650 units/hr at 2300 on 9/20  Check 8 hr heparin level  Monitor heparin level, CBC and s/s of bleeding daily   Cristela Felt, PharmD, BCPS Clinical Pharmacist 07/26/2021 3:17 PM  Please check AMION for all [place] phone numbers After 10:00 PM, call [place] [phone]

## 2021-08-01 NOTE — Progress Notes (Signed)
PT Cancellation Note  Patient Details Name: Paul [name] MRN: [id number] DOB: 1940-11-21   Cancelled Treatment:    Reason Eval/Treat Not Completed: Patient at procedure or test/unavailable, down in OR.  Will attempt another day.   Reginia Naas 07/13/2021, 3:05 PM Magda Kiel, PT Acute Rehabilitation Services [phone] [phone] 07/30/2021

## 2021-08-01 NOTE — Progress Notes (Signed)
  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  Course complicated by postop bleeding prompting return OR trip 9/17 with revision  including right and left iliofemoral embolectomy.  Course further complicated by occluded bypass prompting second OR trip 9/20 for redo of R to L femoral - femoral bypass. - Post op care per vascular surgery.  Respiratory insufficiency - due to inability to protect the airway in the setting of above. - Full vent support. - Assess ABG. - Wean as able. - Daily SBT. - Bronchial hygiene. - Follow CXR.  Hx COPD. - Continue BD's. - Mobilize when able.   Hx A.fib. - Continue Heparin, Cardizem. - Transition to Eliquis when cleared by vascular and cards.  Hx HTN, HLD. - Continue Labetalol PRN. - Hold home Furosemide, Losartan.  AoC anemia. - Monitor for signs of bleeding. - Transfuse for Hgb < 7.  Hx DM. - SSI. - Hold home Metformin, Empagliflozin.

## 2021-08-01 NOTE — Progress Notes (Addendum)
Progress Note  Patient Name: Paul [name] Date of Encounter: 07/26/2021  [facility] Cardiologist: Kirk Ruths, MD   Subjective   Breathing improved after IV lasix. HR improved. No chest pain. Plan to take to OR today.   Inpatient Medications    Scheduled Meds:  sodium chloride   Intravenous Once   sodium chloride   Intravenous Once   atorvastatin  40 mg Oral Daily   Chlorhexidine Gluconate Cloth  6 each Topical Daily   diltiazem  30 mg Oral Q6H   docusate sodium  100 mg Oral Daily   empagliflozin  25 mg Oral Daily   furosemide  20 mg Oral Daily   gabapentin  1,200 mg Oral QHS   insulin aspart  0-15 Units Subcutaneous TID WC   losartan  50 mg Oral Q breakfast   metFORMIN  1,000 mg Oral BID WC   metoCLOPramide  5 mg Oral TID AC   pantoprazole  40 mg Oral Daily   Continuous Infusions:  sodium chloride     sodium chloride 100 mL/hr at 07/31/2021 1507   sodium chloride 125 mL/hr at 07/25/2021 0150   heparin 1,650 Units/hr (08/04/2021 0148)   magnesium sulfate bolus IVPB     PRN Meds: sodium chloride, acetaminophen **OR** acetaminophen, albuterol, alum & mag hydroxide-simeth, bisacodyl, guaiFENesin-dextromethorphan, hydrALAZINE, labetalol, magnesium sulfate bolus IVPB, morphine injection, ondansetron, oxyCODONE-acetaminophen, phenol, polyethylene glycol, potassium chloride, sodium chloride   Vital Signs    Vitals:   07/30/2021 0450 08/10/2021 0451 08/03/2021 0500 08/02/2021 0753  BP: (!) 111/58 (!) 111/58  118/60  Pulse: 76   87  Resp: (!) 24 (!) 24 20 (!) 21  Temp: 98 F (36.7 C)   (!) 97.5 F (36.4 C)  TempSrc: Axillary   Oral  SpO2: 100%   99%  Weight:   95.7 kg   Height:    5' 7.5" (1.715 m)    Intake/Output Summary (Last 24 hours) at 07/15/2021 0934 Last data filed at 08/08/2021 0837 Gross per 24 hour  Intake 1796.44 ml  Output 3800 ml  Net -2003.56 ml   Last 3 Weights 08/11/2021 08/04/2021 07/25/2021  Weight (lbs) 210 lb 15.7 oz 194 lb 193 lb  Weight (kg)  95.7 kg 87.998 kg 87.544 kg      Telemetry    AFib 70-90s - Personally Reviewed  ECG    N/A  Physical Exam   GEN: No acute distress.   Neck: No JVD Cardiac: IR IR, no murmurs, rubs, or gallops.  Respiratory:Diminished breath sound  GI: Soft, nontender, non-distended  MS: No edema; No deformity. Neuro:  Nonfocal  Psych: Normal affect   Labs     Chemistry Recent Labs  Lab 07/30/21 1721 07/31/21 0818 07/31/21 1835  NA 134* 135 133*  K 3.8 4.0 4.4  CL 104 102 100  CO2 21* 20* 21*  GLUCOSE 160* 130* 156*  BUN _0 CREATININE 0.76 0.87 0.90  CALCIUM 7.9* 8.1* 7.9*  GFRNONAA >60 >60 >60  ANIONGAP _1 Lipids  Recent Labs  Lab 07/13/2021 0101  CHOL 66  TRIG 45  HDL 35*  LDLCALC 22  CHOLHDL 1.9    Hematology Recent Labs  Lab 07/31/21 0818 07/15/2021 0106 08/09/2021 0710  WBC 12.7* 14.6* 12.5*  RBC 2.92* 2.92* 2.88*  HGB 8.3* 8.4* 8.1*  HCT 26.2* 25.8* 25.9*  MCV 89.7 88.4 89.9  MCH 28.4 28.8 28.1  MCHC 31.7 32.6 31.3  RDW 16.5* 16.4* 16.5*  PLT 182 236 225   Thyroid No results for input(s): TSH, FREET4 in the last 168 hours.  BNPNo results for input(s): BNP, PROBNP in the last 168 hours.  DDimer No results for input(s): DDIMER in the last 168 hours.   Radiology    CT ANGIO AO+BIFEM W & OR WO CONTRAST  Result Date: 07/31/2021 CLINICAL DATA:  Claudication. Concern for femoral femoral bypass graft dysfunction. EXAM: CT ANGIOGRAPHY OF ABDOMINAL AORTA WITH ILIOFEMORAL RUNOFF TECHNIQUE: Multidetector CT imaging of the abdomen, pelvis and lower extremities was performed using the standard protocol during bolus administration of intravenous contrast. Multiplanar CT image reconstructions and MIPs were obtained to evaluate the vascular anatomy. CONTRAST:  178m OMNIPAQUE IOHEXOL 350 MG/ML SOLN COMPARISON:  CT dated 08/08/2021. FINDINGS: VASCULAR Aorta: Advanced atherosclerotic calcification. No aneurysmal dilatation or dissection. Celiac: Atherosclerotic  calcification of the celiac artery. The celiac artery and its major branches are patent. SMA: Atherosclerotic calcification of the SMA.  The SMA is patent. Renals: There is atherosclerotic calcification of the renal arteries. The renal arteries remain patent. 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 IMA: The IMA is patent. RIGHT Lower Extremity Inflow: Atherosclerotic calcification of the iliac arteries. The right iliac arteries are patent. Outflow: The common femoral artery, superficial and deep femoral arteries are patent. The popliteal artery is patent. There is atherosclerotic calcification of the femoral and popliteal artery. Runoff: Advanced atherosclerotic calcification of the calf arteries limiting evaluation. The posterior tibial artery appears patent. Evaluation of the anterior tibial and fibular arteries is limited due to suboptimal opacification and timing of the contrast as well as calcification. LEFT Lower Extremity Inflow: The left common iliac artery, internal and external iliac arteries are occluded and thrombosed. A femoral femoral bypass graft is noted. There is near complete occlusion of the entire length of the graft starting approximally 2 cm distal to the anastomosis with the right femoral artery. There is complex fluid with pockets of air adjacent to the graft in the subcutaneous soft tissues of the anterior pelvic wall which may represent postsurgical seroma and fluid. An infectious process is not excluded. No extravasation of contrast or evidence of active bleed. Outflow: There is reconstitution of the flow in the common femoral artery likely from the inferior epigastric artery. The superficial and deep femoral arteries are patent. The popliteal artery is patent although with diminished flow. There is atherosclerotic calcification of the femoral and popliteal arteries. Runoff: Very limited evaluation of the calf arteries due to atherosclerotic calcification and suboptimal  opacification and timing of the contrast. Duplex ultrasound may provide better evaluation. Veins: No obvious venous abnormality within the limitations of this arterial phase study. Review of the MIP images confirms the above findings. NON-VASCULAR Lower chest: Small bilateral pleural effusions with bibasilar atelectasis or infiltrate. A 2.4 cm nodule in the right lung base. Follow-up as per recommendation of prior CT. Coronary vascular calcifications noted. No intra-abdominal free air or free fluid. Hepatobiliary: Apparent fatty liver. No intrahepatic biliary ductal dilatation. Cholecystectomy. Pancreas: Unremarkable. No pancreatic ductal dilatation or surrounding inflammatory changes. Spleen: Normal in size without focal abnormality. Adrenals/Urinary Tract: Bilateral fat containing adrenal lesions measure up to 7 cm on the right consistent with myelolipoma. There is no hydronephrosis on either side. The visualized ureters appear unremarkable. The urinary bladder is collapsed. Air within the urinary bladder introduced via the catheter. Stomach/Bowel: No bowel obstruction or active inflammation. The appendix is normal. Lymphatic: No adenopathy. Reproductive: The prostate gland is grossly unremarkable. Other: Diffuse subcutaneous edema.  Surgical clips over the anterior pelvic wall. Diffuse scrotal wall edema. Musculoskeletal: Degenerative changes of the spine. No acute osseous pathology. IMPRESSION: 1. Complete occlusion of the femoral-femoral bypass graft. Probable seroma adjacent to the graft. Superimposed infection is not excluded clinical correlation is recommended. 2. Occlusion of the left iliac arteries with reconstitution of the flow in the left common femoral artery from the inferior epigastric artery. 3. Patent bilateral femoral and popliteal arteries. 4. Limited evaluation of the calf arteries. 5. Small bilateral pleural effusions with bibasilar atelectasis or infiltrate. A 2.4 cm nodule in the right lung  base. Consider one of the following in 3 months for both low-risk and high-risk individuals: (a) repeat chest CT, (b) follow-up PET-CT, or (c) tissue sampling. This recommendation follows the consensus statement: Guidelines for Management of Incidental Pulmonary Nodules Detected on CT Images: From the Fleischner Society 2017; Radiology 2017; 284:228-243. 6. Bilateral adrenal myelolipomas. 7. No bowel obstruction. Normal appendix. 8. Aortic Atherosclerosis (ICD10-I70.0). Electronically Signed   By: Anner Crete M.D.   On: 07/31/2021 23:16    Cardiac Studies   Echo 07/27/21 (outpatient) 1. Left ventricular ejection fraction, by estimation, is 55 to 60%. The  left ventricle has normal function. The left ventricle has no regional  wall motion abnormalities. Left ventricular diastolic function could not  be evaluated. There is the  interventricular septum is flattened in systole, consistent with right  ventricular pressure overload.   2. Right ventricular systolic function is mildly reduced. The right  ventricular size is moderately enlarged. There is moderately elevated  pulmonary artery systolic pressure.   3. Left atrial size was moderately dilated.   4. Right atrial size was mild to moderately dilated.   5. The mitral valve is normal in structure. No evidence of mitral valve  regurgitation.   6. The aortic valve is tricuspid. Aortic valve regurgitation is not  visualized. Mild aortic valve sclerosis is present, with no evidence of  aortic valve stenosis.   7. Aortic dilatation noted. There is mild dilatation of the ascending  aorta, measuring 40 mm. There is mild dilatation of the aortic root,  measuring 39 mm.   8. The inferior vena cava is dilated in size with <50% respiratory  variability, suggesting right atrial pressure of 15 mmHg.   Comparison(s): Prior images reviewed side by side. There is worsened  pulmonary hypertension, increased right atrial pressure. On direct  comparison,  there is little change in RV size and systolic function.     Patient Profile     Paul [name] is a 80 y.o. adult with a hx of of coronary artery disease status post PCI of the LAD in 1988, permanent atrial fibrillation, PAD, COPD, diabetes mellitus, essential hypertension, hyperlipidemia, previous lung cancer and tobacco use who is being seen  for the evaluation of afib RVR at the request of Dr. Carlis Abbott.  Assessment & Plan    Permanent atrial fibrillation with transient tachycardia -His heart rate was fairly controlled since admission.  He has transient tachycardia with movement.  This is due to deconditioning. HR event better with cardizem 38m q 6 hours>> transition to long acting post procedure -Continue heparin for anticoagulation.  Transition to Eliquis when no bleeding issue.   2.  Hypertension -Blood pressure stable on current meds    3. Acute dyspnea - Given IV lasix 440mx 1 yesterday with significant diuresis and improved breathing. CT angio small bilateral pleural effusions with bibasilar atelectasis or infiltrate. Pending chest x-ray. Consider IV lasix  today post OR.   4. PVD - Plan for revision of bypass graft today   For questions or updates, please contact [place] Please consult [email or web address] for contact info under        Signed, Leanor Kail, PA  07/17/2021, 9:34 AM     I have examined the patient and reviewed assessment and plan and discussed with patient.  Agree with above as stated.  Heart rate much better controlled today.  His biggest complaint is that his mouth is dry and he wants ice chips.  He is not allowed to have any due to his upcoming surgery later today.  Will continue Cardizem but change it to a long-acting formulation after his surgery.  Once bleeding issues resolve, will need to go back to Eliquis.  Plan discussed with the family at bedside.  Larae Grooms

## 2021-08-01 NOTE — Transfer of Care (Signed)
Immediate Anesthesia Transfer of Care Note  Patient: Paul [name]  Procedure(s) Performed: REVISION BYPASS GRAFT FEMORAL-FEMORAL ARTERY (Groin)  Patient Location: ICU  Anesthesia Type:General  Level of Consciousness: sedated and Patient remains intubated per anesthesia plan  Airway & Oxygen Therapy: Patient remains intubated per anesthesia plan and Patient placed on Ventilator (see vital sign flow sheet for setting)  Post-op Assessment: Report given to RN and Post -op Vital signs reviewed and stable  Post vital signs: Reviewed and stable  Last Vitals:  Vitals Value Taken Time  BP 119/62 07/27/2021 1613  Temp    Pulse 73 07/17/2021 1618  Resp 20 08/09/2021 1618  SpO2 95 % 07/22/2021 1618  Vitals shown include unvalidated device data.  Last Pain:  Vitals:   07/20/2021 1104  TempSrc:   PainSc: 6      Patients Stated Pain Goal: 3 (01/00/71 2197)  Complications: No notable events documented.

## 2021-08-01 NOTE — Consult Note (Signed)
NAME:  Paul [name], MRN:  [id number], DOB:  22-Oct-1941, LOS: 4 ADMISSION DATE:  07/21/2021, CONSULTATION DATE:  08/08/2021 REFERRING MD:  Dr. Scot Dock, CHIEF COMPLAINT:     History of Present Illness:  HPI obtained from medical chart review as patient remains sedated and intubated on mechanical ventilation.   80 year old male with medical history of tobacco abuse, COPD, prior lung cancer s/p wedge resection LLL 2007, PE (2013), PAD, CAD, afib on Eliquis, DM, HTN, and HLD admitted to vascular surgery on 9/16 for right to left femorofemoral bypass after left great toe wound x 3 months with unsuccessful retrograde and antegrade attempts from brachial access to cross left common iliac occlusion by Dr. Fletcher Anon on 07/19/21.  Patient is followed in our office by Dr. Melvyn Novas.  He continues to smoke.     Patient underwent right to left femoral artery bypass on 9/16.  Course complicated by postop bleeding and hematoma requiring return to the OR on 9/17 for bilateral groin exploration, right iliofemoral embolectomy, left iliofemoral embolectomy, and femoral-femoral bypass revision.  Patient was seen by Cardiology on 9/19 for atrial fibrillation with rapid ventricular rate in which Cardizem was added and maintained on heparin gtt per pharmacy.  Also noted during this time patient had some shortness of breath with CXR showing lung hyperexpansion and chronic bronchitic changes and minimal bibasilar atelectasis without acute process.  He was given lasix with reported improvement.  However, he was found on repeat imaging to have re-occluded bypass prompting return third return to OR on 9/20 for redo of right to left femorofemoral bypass.  He returns the ICU on mechanical ventilation, PCCM consulted for vent management.    Pertinent  Medical History  Ongoing tobacco abuse, COPD, GERD, DM, CAD s/p PCI 1988, Afib on Eliquis, PAD, HTN, HLD, PE 2013, Stage IA (T1, N0, MX) non-small cell lung cancer, adenocarcinoma s/p wedge  resection of LLL and seed implant 01/2006  Significant Hospital Events: Including procedures, antibiotic start and stop dates in addition to other pertinent events   9/16 admitted for right to left femoral artery bypass on 9/16 9/17 post op hematoma/ bleeding, back to OR for s/p bilateral groin exploration, right iliofemoral embolectomy, left iliofemoral embolectomy, femoral-femoral bypass revision 9/19 cards consulted for Afib with RVR-> started on cardizem.  SOB s/p lasix 9/20 CT showing re-occlusion -> back to OR for redo bypass, returns to ICU on mechanical ventilation; PCCM consulted  Interim History / Subjective:  Consulted  Objective   Blood pressure 128/70, pulse 90, temperature 98.7 F (37.1 C), temperature source Oral, resp. rate 18, height 5' 7.5" (1.715 m), weight 95.7 kg, SpO2 92 %.        Intake/Output Summary (Last 24 hours) at 07/19/2021 1504 Last data filed at 08/03/2021 1455 Gross per 24 hour  Intake 4146.44 ml  Output 3725 ml  Net 421.44 ml   Filed Weights   08/05/2021 0602 08/05/2021 0500 08/06/2021 1053  Weight: 88 kg 95.7 kg 95.7 kg    Examination: Ill appearing man on vent Lungs are clear but has high o2 needs Heart sounds regular, ext warm Swelling of lower abdomen and R groin c/w recent hematoma Ext with severe PAD changes, dopplerable pulses Currently sedated and paralyzed so neuro exam deferred  CBC stable BMP stable CXR pending  Resolved Hospital Problem list    Assessment & Plan:   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  Course complicated  by postop bleeding prompting return OR trip 9/17 with revision  including right and left iliofemoral embolectomy.  Course further complicated by occluded bypass prompting second OR trip 9/20 for redo of R to L femoral - femoral bypass. - Post op care per vascular surgery.   Respiratory insufficiency - due to inability to protect the airway in the setting of  above. Hx COPD. - Full vent support.  Plat/Peak are okay, liberalize PEEP - Assess ABG/CXR - Wean as able. - Daily SBT, too much O2 for this today - Bronchial hygiene. - Continue BD's. - Mobilize when able.   Hx A.fib. - Continue Heparin, Cardizem. - Transition to Eliquis when cleared by vascular and cards.   Hx HTN, HLD. - Continue Labetalol PRN. - Hold home Furosemide, Losartan, cardizem   AoC anemia. - Monitor for signs of bleeding. - Transfuse for Hgb < 7.   Hx DM. - SSI. - Hold home Metformin, Empagliflozin.  Right basilar lung nodule  - 2.4 cm  RLL nodule, almost appears spiculated, seen on CT 07/16/2021. Previous hx of tage IA (T1, N0, MX) non-small cell lung cancer, adenocarcinoma s/p wedge resection and seed implants of LLL 01/2006.  Appears to last been seen by oncology in 2015.  Can f/u with oncology or follow up in our office with either Dr. Valeta Harms or Dr. Lamonte Sakai for further diagnostic evaluation.    Best Practice (right click and "Reselect all SmartList Selections" daily)   Diet/type: NPO DVT prophylaxis: systemic heparin GI prophylaxis: PPI Lines: N/A Foley:  Yes, and it is still needed Code Status:  full code Last date of multidisciplinary goals of care discussion [per primary]  Labs   CBC: Recent Labs  Lab 07/30/21 0400 07/30/21 0800 07/31/21 0818 07/27/2021 0106 07/14/2021 0710  WBC 13.0* 13.7* 12.7* 14.6* 12.5*  HGB 8.1* 8.1* 8.3* 8.4* 8.1*  HCT 24.5* 24.6* 26.2* 25.8* 25.9*  MCV 87.2 87.2 89.7 88.4 89.9  PLT 149* 154 182 236 364    Basic Metabolic Panel: Recent Labs  Lab 07/22/2021 1800 07/30/21 0800 07/30/21 1721 07/31/21 0818 07/31/21 1835  NA 134* 133* 134* 135 133*  K 3.8 3.9 3.8 4.0 4.4  CL 104 102 104 102 100  CO2 22 20* 21* 20* 21*  GLUCOSE 177* 172* 160* 130* 156*  BUN _0 CREATININE 0.82 0.90 0.76 0.87 0.90  CALCIUM 7.7* 7.8* 7.9* 8.1* 7.9*   GFR: Estimated Creatinine Clearance (by C-G formula based on SCr of 0.9  mg/dL) Male: 58.8 mL/min Male: 71.7 mL/min Recent Labs  Lab 07/30/21 0800 07/31/21 0818 07/22/2021 0106 07/18/2021 0710  WBC 13.7* 12.7* 14.6* 12.5*    Liver Function Tests: No results for input(s): AST, ALT, ALKPHOS, BILITOT, PROT, ALBUMIN in the last 168 hours. No results for input(s): LIPASE, AMYLASE in the last 168 hours. No results for input(s): AMMONIA in the last 168 hours.  ABG    Component Value Date/Time   PHART 7.276 (L) 07/30/2021 0346   PCO2ART 48.1 (H) 07/23/2021 0346   PO2ART 209 (H) 08/03/2021 0346   HCO3 22.4 08/02/2021 0346   TCO2 24 07/26/2021 0346   ACIDBASEDEF 4.0 (H) 07/21/2021 0346   O2SAT 100.0 07/14/2021 0346     Coagulation Profile: No results for input(s): INR, PROTIME in the last 168 hours.  Cardiac Enzymes: No results for input(s): CKTOTAL, CKMB, CKMBINDEX, TROPONINI in the last 168 hours.  HbA1C: Hgb A1c MFr Bld  Date/Time Value Ref Range Status  07/24/2021 06:31 AM 6.6 (  H) 4.8 - 5.6 % Final    Comment:    (NOTE) Pre diabetes:          5.7%-6.4%  Diabetes:              >6.4%  Glycemic control for   <7.0% adults with diabetes   08/09/2016 10:20 AM 6.5 (H) 4.8 - 5.6 % Final    Comment:    (NOTE)         Pre-diabetes: 5.7 - 6.4         Diabetes: >6.4         Glycemic control for adults with diabetes: <7.0     CBG: Recent Labs  Lab 07/31/21 1001 07/31/21 1655 07/31/21 2109 07/25/2021 0601 08/04/2021 1020  GLUCAP 142* 160* 129* 145* 106*    Review of Systems:   Unable.   Past Medical History:  He,  has a past medical history of Asthma, Atrial fibrillation ([place]), CAD (coronary artery disease), COPD (chronic obstructive pulmonary disease) ([place]), Diabetes mellitus, GERD (gastroesophageal reflux disease), Hard of hearing, Hyperlipidemia, Hypertension, lung ca (dx'd 01/2006), Nephrolithiasis, Neuropathy, Osteopenia, Pulmonary embolus ([place]), Tibia/fibula fracture (07/2016), and Tremor, essential.   Surgical History:   Past  Surgical History:  Procedure Laterality Date   ABDOMINAL AORTOGRAM W/LOWER EXTREMITY N/A 07/12/2021   Procedure: ABDOMINAL AORTOGRAM W/LOWER EXTREMITY;  Surgeon: Wellington Hampshire, MD;  Location: [facility];  Service: Cardiovascular;  Laterality: N/A;   Angioplasty  Approx [id number]   APPLICATION OF WOUND VAC Bilateral 07/27/2021   Procedure: APPLICATION OF WOUND VAC BILATERAL GROINS;  Surgeon: Broadus John, MD;  Location: [facility];  Service: Vascular;  Laterality: Bilateral;   CARDIAC CATHETERIZATION  Approximately 1993   CHOLECYSTECTOMY     enchondroma Right 2016   Humerus   FEMORAL ARTERY EXPLORATION Bilateral 07/25/2021   Procedure: BILATERAL FEMORAL ARTERY EXPLORATION WITH BYPASS REVISION;  Surgeon: Broadus John, MD;  Location: [facility];  Service: Vascular;  Laterality: Bilateral;   FEMORAL-FEMORAL BYPASS GRAFT Bilateral 07/25/2021   Procedure: RIGHT TO LEFT BYPASS GRAFT FEMORAL-FEMORAL ARTERY WITH 8MM DACARON GRAFT;  Surgeon: Marty Heck, MD;  Location: [facility];  Service: Vascular;  Laterality: Bilateral;   FIXATION KYPHOPLASTY     INTRAOPERATIVE ARTERIOGRAM Right 07/24/2021   Procedure: INTRA OPERATIVE ARTERIOGRAM WITH BYPASS ANGIOGRAPHY;  Surgeon: Broadus John, MD;  Location: [facility];  Service: Vascular;  Laterality: Right;   LUNG CANCER SURGERY  01/2006   Small excision of left lung tissue; mesh with radioactive seeds (per pt report)   ORIF TIBIA FRACTURE Left 08/22/2016   Procedure: OPEN REDUCTION INTERNAL FIXATION (ORIF) TIBIA FRACTURE;  Surgeon: Nicholes Stairs, MD;  Location: [facility];  Service: Orthopedics;  Laterality: Left;   PELVIC ANGIOGRAPHY Left 07/19/2021   Procedure: LEFT COMMON ILIAC PTA;  Surgeon: Wellington Hampshire, MD;  Location: [facility];  Service: Cardiovascular;  Laterality: Left;   PERIPHERAL VASCULAR BALLOON ANGIOPLASTY  07/12/2021   Procedure: PERIPHERAL VASCULAR BALLOON ANGIOPLASTY;  Surgeon: Wellington Hampshire, MD;  Location: [facility];  Service: Cardiovascular;;  aborted PTA of left common iliac   ROTATOR CUFF TEAR  2016   THROMBECTOMY ILIAC ARTERY Bilateral 07/21/2021   Procedure: THROMBECTOMY BILATERAL ILIAC ARTERY;  Surgeon: Broadus John, MD;  Location: [facility];  Service: Vascular;  Laterality: Bilateral;   Tibia/Fibula Repair Left 07/2016   TONSILLECTOMY       Social History:   reports that he has been smoking cigarettes. He  has a 29.50 pack-year smoking history. He has never used smokeless tobacco. He reports that he does not currently use alcohol after a past usage of about 1.0 standard drink per week. He reports that he does not use drugs.   Family History:  His family history includes Dementia in his mother; Heart Problems in his father; Heart disease in his mother; Tremor in his mother.   Allergies Allergies  Allergen Reactions   Codeine Anaphylaxis   Phenobarbital Hypertension     Home Medications  Prior to Admission medications   Medication Sig Start Date End Date Taking? Authorizing Provider  acetaminophen (TYLENOL) 500 MG tablet Take 1,000 mg by mouth every 8 (eight) hours as needed for moderate pain.   Yes [provider]  albuterol (VENTOLIN HFA) 108 (90 Base) MCG/ACT inhaler Inhale 2 puffs into the lungs every 2 (two) hours as needed for wheezing or shortness of breath.    Yes [provider]  atorvastatin (LIPITOR) 40 MG tablet TAKE 1 TABLET BY MOUTH AT  BEDTIME Patient taking differently: Take 40 mg by mouth daily. 10/06/18  Yes Lelon Perla, MD  calcium carbonate (OS-CAL) 600 MG TABS tablet Take 600 mg by mouth every evening.   Yes [provider]  Cholecalciferol (VITAMIN D3) 20 MCG (800 UNIT) TABS Take 800 Units by mouth daily.   Yes [provider]  fexofenadine (ALLEGRA) 180 MG tablet Take 180 mg by mouth daily.   Yes [provider]  fluticasone (FLONASE) 50 MCG/ACT nasal spray Place 1 spray into the nose 2 (two) times daily.   Yes  [provider]  furosemide (LASIX) 20 MG tablet TAKE 1 TABLET BY MOUTH  DAILY Patient taking differently: Take 20 mg by mouth daily. 12/05/18  Yes Tanda Rockers, MD  gabapentin (NEURONTIN) 300 MG capsule Take 1,200 mg by mouth at bedtime. 4 at bedtime   Yes [provider]  guaiFENesin (MUCINEX) 600 MG 12 hr tablet Take 1,200 mg by mouth daily with breakfast.    Yes [provider]  losartan (COZAAR) 50 MG tablet Take 1 tablet (50 mg total) by mouth daily. Patient taking differently: Take 50 mg by mouth daily with breakfast. 10/21/13  Yes Tanda Rockers, MD  metFORMIN (GLUCOPHAGE) 1000 MG tablet Take 1,000 mg by mouth 2 (two) times daily with a meal.   Yes [provider]  metoCLOPramide (REGLAN) 5 MG tablet Take 5 mg by mouth 3 (three) times daily before meals. 04/01/21  Yes [provider]  Multiple Vitamins-Minerals (CENTRUM SILVER ADULT 50+) TABS Take 1 tablet by mouth daily with breakfast.   Yes [provider]  omeprazole (PRILOSEC OTC) 20 MG tablet Take 20 mg by mouth daily as needed (heartburn).   Yes [provider]  OXYGEN Inhale 3-4 L into the lungs continuous. 4L of oxygen at night and 3L of oxygen when walking long distances   Yes [provider]  vitamin C (ASCORBIC ACID) 250 MG tablet Take 250 mg by mouth daily.   Yes [provider]  ELIQUIS 5 MG TABS tablet TAKE 1 TABLET BY MOUTH  TWICE DAILY Patient taking differently: Take 5 mg by mouth 2 (two) times daily. 01/09/21   Deberah Pelton, NP  empagliflozin (JARDIANCE) 25 MG TABS tablet Take 25 mg by mouth daily.    [provider]  [facility] injection  01/03/21   [provider]     Critical care time: 32 minutes not including any separately billable  procedures

## 2021-08-01 NOTE — Progress Notes (Addendum)
  Daily Progress Note  S/p: Femoral-femoral bypass revision.  Subjective: Patient resting comfortably this morning.  Some labored breathing with conversation.  No complaints denies sensorimotor deficits in the left foot  Objective: Vitals:   07/21/2021 0753 08/03/2021 1053  BP: 118/60 128/70  Pulse: 87 90  Resp: (!) 21 18  Temp: (!) 97.5 F (36.4 C) 98.7 F (37.1 C)  SpO2: 99% 92%    Physical Examination Patient with monophasic DP signal in the left foot, multiphasic PT signal in the right foot No phasicity in the previous bypass Sensorimotor intact in left foot Left foot wound dry Lungs wet to auscultation A. Fib-chronic   ASSESSMENT/PLAN:  Patient is an 80 year old male status post femoral-femoral bypass revision.  Overnight he went CT scan demonstrating occlusion of his previously placed bypass.  I discussed the risks and benefits of bypass revision with Paul [name] this morning, and called his wife to let her know that the bypass was likely kinked. Discussed the risk and benefits, Paul [name] elected to proceed with redo femorofemoral bypass procedure. Plan will be resection of the Dacron graft, implantation of 8 mm ringed PTFE with new tunnel Respiratory standpoint, chest x-ray demonstrates mild atelectasis.  We will work toward aggressive pulmonary toilet once postop  My concerns with redo surgery in this gentleman include his chronic health conditions, pulmonary status, reoperative bilateral groins in the setting of morbid obesity.     Cassandria Santee MD [facility] [phone] 07/31/2021  11:05 AM

## 2021-08-01 NOTE — Progress Notes (Signed)
Pt currently on heparin drip and going down for a procedure. Vascular PA informed me that we need to keep the heparin drip going and they will stop it downstairs.  Chrisandra Carota, RN 08/06/2021 10:37 AM

## 2021-08-01 NOTE — Anesthesia Postprocedure Evaluation (Signed)
Anesthesia Post Note  Patient: Paul [name]  Procedure(s) Performed: REVISION BYPASS GRAFT FEMORAL-FEMORAL ARTERY (Groin)     Patient location during evaluation: SICU Anesthesia Type: General Level of consciousness: sedated Pain management: pain level controlled Vital Signs Assessment: post-procedure vital signs reviewed and stable Respiratory status: patient remains intubated per anesthesia plan Cardiovascular status: stable Postop Assessment: no apparent nausea or vomiting Anesthetic complications: no Comments: Patient consistently acidemic despite intraoperative efforts. Decision made to keep patient intubated after surgery.    No notable events documented.  Last Vitals:  Vitals:   08/02/2021 1945 08/08/2021 2000  BP:  (!) 110/52  Pulse: 80 83  Resp: (!) 28 (!) 28  Temp:  37.4 C  SpO2: 93% 94%    Last Pain:  Vitals:   07/15/2021 1104  TempSrc:   PainSc: 6                  Netta Fodge P Breyona Swander

## 2021-08-01 NOTE — H&P (View-Only) (Signed)
  Daily Progress Note  S/p: Femoral-femoral bypass revision.  Subjective: Patient resting comfortably this morning.  Some labored breathing with conversation.  No complaints denies sensorimotor deficits in the left foot  Objective: Vitals:   07/22/2021 0753 07/22/2021 1053  BP: 118/60 128/70  Pulse: 87 90  Resp: (!) 21 18  Temp: (!) 97.5 F (36.4 C) 98.7 F (37.1 C)  SpO2: 99% 92%    Physical Examination Patient with monophasic DP signal in the left foot, multiphasic PT signal in the right foot No phasicity in the previous bypass Sensorimotor intact in left foot Left foot wound dry Lungs wet to auscultation A. Fib-chronic   ASSESSMENT/PLAN:  Patient is an 80 year old male status post femoral-femoral bypass revision.  Overnight he went CT scan demonstrating occlusion of his previously placed bypass.  I discussed the risks and benefits of bypass revision with Storm this morning, and called his wife to let her know that the bypass was likely kinked. Discussed the risk and benefits, Daishon elected to proceed with redo femorofemoral bypass procedure. Plan will be resection of the Dacron graft, implantation of 8 mm ringed PTFE with new tunnel Respiratory standpoint, chest x-ray demonstrates mild atelectasis.  We will work toward aggressive pulmonary toilet once postop  My concerns with redo surgery in this gentleman include his chronic health conditions, pulmonary status, reoperative bilateral groins in the setting of morbid obesity.     Cassandria Santee MD MS [facility] [phone] 08/08/2021  11:05 AM

## 2021-08-01 NOTE — Progress Notes (Signed)
Pt went to CT with a staff nurse. He was transferred  back to the 4E unit at 11:03  pm. His vital sighs remains stable. We will continue to monitor.  Kennyth Lose, RN

## 2021-08-01 NOTE — Interval H&P Note (Signed)
History and Physical Interval Note:  07/15/2021 11:18 AM  Paul [name]  has presented today for surgery, with the diagnosis of thrombosed bypass.  The various methods of treatment have been discussed with the patient and family. After consideration of risks, benefits and other options for treatment, the patient has consented to  Procedure(s): REVISION BYPASS GRAFT FEMORAL-FEMORAL ARTERY (N/A) as a surgical intervention.  The patient's history has been reviewed, patient examined, no change in status, stable for surgery.  I have reviewed the patient's chart and labs.  Questions were answered to the patient's satisfaction.     Deitra Mayo

## 2021-08-01 NOTE — Care Management Important Message (Signed)
Important Message  Patient Details  Name: Paul [name] MRN: [id number] Date of Birth: [birth year]   Medicare Important Message Given:  Yes     Orbie Pyo 08/10/2021, 3:27 PM

## 2021-08-01 NOTE — Progress Notes (Signed)
[facility] Progress Note Patient Name: Paul [name] DOB: 1940-12-15 MRN: [id number]   Date of Service  07/21/2021  HPI/Events of Note  KCl and Mag reported as low  eICU Interventions  Both replaced via OG tube     Intervention Category Minor Interventions: Electrolytes abnormality - evaluation and management  Tilden Dome 07/27/2021, 10:35 PM

## 2021-08-01 NOTE — Anesthesia Preprocedure Evaluation (Addendum)
Anesthesia Evaluation  Patient identified by MRN, date of birth, ID band Patient awake    Reviewed: Allergy & Precautions, H&P , NPO status , Patient's Chart, lab work & pertinent test results  Airway Mallampati: II   Neck ROM: full    Dental   Pulmonary asthma , COPD,  COPD inhaler and oxygen dependent, Current Smoker and Patient abstained from smoking., PE   breath sounds clear to auscultation       Cardiovascular hypertension, Pt. on medications + CAD and + Peripheral Vascular Disease  + dysrhythmias Atrial Fibrillation  Rhythm:irregular Rate:Normal  TTE: EF 55-60%   Neuro/Psych negative neurological ROS     GI/Hepatic Neg liver ROS, GERD  ,  Endo/Other  diabetes, Oral Hypoglycemic Agents  Renal/GU negative Renal ROS     Musculoskeletal negative musculoskeletal ROS (+)   Abdominal (+) + obese,   Peds  Hematology  (+) anemia , HLD   Anesthesia Other Findings thrombosed bypass  Reproductive/Obstetrics                            Anesthesia Physical Anesthesia Plan  ASA: 4  Anesthesia Plan: General   Post-op Pain Management:    Induction: Intravenous  PONV Risk Score and Plan: 1 and Ondansetron, Dexamethasone and Treatment may vary due to age or medical condition  Airway Management Planned: Oral ETT  Additional Equipment: Arterial line  Intra-op Plan:   Post-operative Plan: Extubation in OR  Informed Consent: I have reviewed the patients History and Physical, chart, labs and discussed the procedure including the risks, benefits and alternatives for the proposed anesthesia with the patient or authorized representative who has indicated his/her understanding and acceptance.     Dental advisory given  Plan Discussed with: CRNA, Anesthesiologist and Surgeon  Anesthesia Plan Comments:        Anesthesia Quick Evaluation

## 2021-08-02 ENCOUNTER — Encounter (HOSPITAL_COMMUNITY): Payer: Self-pay | Admitting: Vascular Surgery

## 2021-08-02 ENCOUNTER — Inpatient Hospital Stay (HOSPITAL_COMMUNITY): Payer: Medicare Other

## 2021-08-02 ENCOUNTER — Inpatient Hospital Stay: Payer: Self-pay

## 2021-08-02 DIAGNOSIS — Z419 Encounter for procedure for purposes other than remedying health state, unspecified: Secondary | ICD-10-CM | POA: Diagnosis not present

## 2021-08-02 DIAGNOSIS — I4821 Permanent atrial fibrillation: Secondary | ICD-10-CM | POA: Diagnosis not present

## 2021-08-02 DIAGNOSIS — I739 Peripheral vascular disease, unspecified: Secondary | ICD-10-CM | POA: Diagnosis not present

## 2021-08-02 LAB — TYPE AND SCREEN
ABO/RH(D): A POS
Antibody Screen: NEGATIVE
Unit division: 0
Unit division: 0
Unit division: 0
Unit division: 0

## 2021-08-02 LAB — BASIC METABOLIC PANEL
Anion gap: 16 — ABNORMAL HIGH (ref 5–15)
BUN: 20 mg/dL (ref 8–23)
CO2: 18 mmol/L — ABNORMAL LOW (ref 22–32)
Calcium: 7.9 mg/dL — ABNORMAL LOW (ref 8.9–10.3)
Chloride: 108 mmol/L (ref 98–111)
Creatinine, Ser: 0.9 mg/dL (ref 0.61–1.24)
GFR, Estimated: >60 mL/min (ref >60)
Glucose, Bld: 124 mg/dL — ABNORMAL HIGH (ref 70–99)
Potassium: 3.8 mmol/L (ref 3.5–5.1)
Sodium: 142 mmol/L (ref 135–145)

## 2021-08-02 LAB — BPAM RBC
Blood Product Expiration Date: 202210102359
Blood Product Expiration Date: 202210102359
Blood Product Expiration Date: 202210102359
Blood Product Expiration Date: 202210102359
ISSUE DATE / TIME: 202209170136
ISSUE DATE / TIME: 202209170136
ISSUE DATE / TIME: 202209170214
ISSUE DATE / TIME: 202209202150
Unit Type and Rh: 6200
Unit Type and Rh: 6200
Unit Type and Rh: 6200
Unit Type and Rh: 6200

## 2021-08-02 LAB — GLUCOSE, CAPILLARY
Glucose-Capillary: 106 mg/dL — ABNORMAL HIGH (ref 70–99)
Glucose-Capillary: 118 mg/dL — ABNORMAL HIGH (ref 70–99)
Glucose-Capillary: 125 mg/dL — ABNORMAL HIGH (ref 70–99)
Glucose-Capillary: 132 mg/dL — ABNORMAL HIGH (ref 70–99)
Glucose-Capillary: 135 mg/dL — ABNORMAL HIGH (ref 70–99)
Glucose-Capillary: 150 mg/dL — ABNORMAL HIGH (ref 70–99)

## 2021-08-02 LAB — CBC
HCT: 28.4 % — ABNORMAL LOW (ref 39.0–52.0)
Hemoglobin: 9.1 g/dL — ABNORMAL LOW (ref 13.0–17.0)
MCH: 28.6 pg (ref 26.0–34.0)
MCHC: 32 g/dL (ref 30.0–36.0)
MCV: 89.3 fL (ref 80.0–100.0)
Platelets: 246 10*3/uL (ref 150–400)
RBC: 3.18 MIL/uL — ABNORMAL LOW (ref 4.22–5.81)
RDW: 16.8 % — ABNORMAL HIGH (ref 11.5–15.5)
WBC: 16.3 10*3/uL — ABNORMAL HIGH (ref 4.0–10.5)
nRBC: 2 % — ABNORMAL HIGH (ref 0.0–0.2)

## 2021-08-02 LAB — MAGNESIUM: Magnesium: 1.8 mg/dL (ref 1.7–2.4)

## 2021-08-02 LAB — HEPARIN LEVEL (UNFRACTIONATED): Heparin Unfractionated: 0.32 IU/mL (ref 0.30–0.70)

## 2021-08-02 LAB — PHOSPHORUS: Phosphorus: 2.6 mg/dL (ref 2.5–4.6)

## 2021-08-02 MED ORDER — ALBUMIN HUMAN 25 % IV SOLN
25.0000 g | Freq: Four times a day (QID) | INTRAVENOUS | Status: AC
  Administered 2021-08-02 – 2021-08-03 (×4): 25 g via INTRAVENOUS
  Filled 2021-08-02 (×4): qty 100

## 2021-08-02 MED ORDER — CHLORHEXIDINE GLUCONATE 0.12% ORAL RINSE (MEDLINE KIT)
15.0000 mL | Freq: Two times a day (BID) | OROMUCOSAL | Status: DC
  Administered 2021-08-02 – 2021-08-07 (×10): 15 mL via OROMUCOSAL

## 2021-08-02 MED ORDER — SODIUM CHLORIDE 0.9% FLUSH
10.0000 mL | Freq: Two times a day (BID) | INTRAVENOUS | Status: DC
  Administered 2021-08-02 – 2021-08-07 (×10): 10 mL

## 2021-08-02 MED ORDER — GABAPENTIN 600 MG PO TABS
600.0000 mg | ORAL_TABLET | Freq: Every day | ORAL | Status: DC
  Administered 2021-08-02 – 2021-08-06 (×5): 600 mg
  Filled 2021-08-02 (×5): qty 1

## 2021-08-02 MED ORDER — SODIUM CHLORIDE 0.9% FLUSH: 10.0000 mL | INTRAVENOUS | Status: DC | PRN

## 2021-08-02 MED ORDER — MAGNESIUM SULFATE 2 GM/50ML IV SOLN
2.0000 g | Freq: Once | INTRAVENOUS | Status: AC
  Administered 2021-08-02: 2 g via INTRAVENOUS
  Filled 2021-08-02: qty 50

## 2021-08-02 MED ORDER — DEXMEDETOMIDINE HCL IN NACL 400 MCG/100ML IV SOLN
0.4000 ug/kg/h | INTRAVENOUS | Status: DC
  Administered 2021-08-02: 0.8 ug/kg/h via INTRAVENOUS
  Administered 2021-08-02: 1 ug/kg/h via INTRAVENOUS
  Administered 2021-08-02: 0.4 ug/kg/h via INTRAVENOUS
  Administered 2021-08-03 (×3): 1.2 ug/kg/h via INTRAVENOUS
  Administered 2021-08-03: 0.6 ug/kg/h via INTRAVENOUS
  Administered 2021-08-03: 1.2 ug/kg/h via INTRAVENOUS
  Administered 2021-08-04: 0.7 ug/kg/h via INTRAVENOUS
  Administered 2021-08-04: 0.4 ug/kg/h via INTRAVENOUS
  Administered 2021-08-04: 0.8 ug/kg/h via INTRAVENOUS
  Administered 2021-08-05: 1.2 ug/kg/h via INTRAVENOUS
  Administered 2021-08-05: 0.6 ug/kg/h via INTRAVENOUS
  Administered 2021-08-05 – 2021-08-06 (×2): 1.2 ug/kg/h via INTRAVENOUS
  Administered 2021-08-06: 0.4 ug/kg/h via INTRAVENOUS
  Administered 2021-08-06: 1 ug/kg/h via INTRAVENOUS
  Administered 2021-08-06: 0.5 ug/kg/h via INTRAVENOUS
  Administered 2021-08-07: 0.4 ug/kg/h via INTRAVENOUS
  Filled 2021-08-02 (×23): qty 100

## 2021-08-02 MED ORDER — POTASSIUM CHLORIDE 20 MEQ PO PACK
40.0000 meq | PACK | Freq: Once | ORAL | Status: AC
  Administered 2021-08-02: 40 meq
  Filled 2021-08-02: qty 2

## 2021-08-02 MED ORDER — DOCUSATE SODIUM 50 MG/5ML PO LIQD
100.0000 mg | Freq: Every day | ORAL | Status: DC
  Administered 2021-08-02 – 2021-08-07 (×6): 100 mg
  Filled 2021-08-02 (×6): qty 10

## 2021-08-02 MED ORDER — ACETAMINOPHEN 325 MG PO TABS
650.0000 mg | ORAL_TABLET | Freq: Four times a day (QID) | ORAL | Status: DC | PRN
  Administered 2021-08-02 – 2021-08-03 (×3): 650 mg via ORAL
  Filled 2021-08-02 (×4): qty 2

## 2021-08-02 MED ORDER — FUROSEMIDE 10 MG/ML IJ SOLN
60.0000 mg | Freq: Three times a day (TID) | INTRAMUSCULAR | Status: AC
  Administered 2021-08-02 (×2): 60 mg via INTRAVENOUS
  Filled 2021-08-02 (×2): qty 6

## 2021-08-02 MED ORDER — ORAL CARE MOUTH RINSE
15.0000 mL | OROMUCOSAL | Status: DC
  Administered 2021-08-02 – 2021-08-07 (×45): 15 mL via OROMUCOSAL

## 2021-08-02 NOTE — Progress Notes (Signed)
Peripherally Inserted Central Catheter Placement  The IV Nurse has discussed with the patient and/or persons authorized to consent for the patient, the purpose of this procedure and the potential benefits and risks involved with this procedure.  The benefits include less needle sticks, lab draws from the catheter, and the patient may be discharged home with the catheter. Risks include, but not limited to, infection, bleeding, blood clot (thrombus formation), and puncture of an artery; nerve damage and irregular heartbeat and possibility to perform a PICC exchange if needed/ordered by physician.  Alternatives to this procedure were also discussed.  Bard Power PICC patient education guide, fact sheet on infection prevention and patient information card has been provided to patient /or left at bedside.    PICC Placement Documentation  PICC Triple Lumen 08/02/21 PICC Right Brachial 41 cm 0 cm (Active)  Indication for Insertion or Continuance of Line Vasoactive infusions 08/02/21 1732  Exposed Catheter (cm) 0 cm 08/02/21 1732  Site Assessment Clean;Dry;Intact 08/02/21 1732  Lumen #1 Status Flushed;Saline locked;Blood return noted 08/02/21 1732  Lumen #2 Status Flushed;Saline locked;Blood return noted 08/02/21 1732  Lumen #3 Status Flushed;Saline locked;Blood return noted 08/02/21 1732  Dressing Type Transparent;Securing device 08/02/21 1732  Dressing Status Clean;Dry;Intact 08/02/21 1732  Antimicrobial disc in place? Yes 08/02/21 1732  Dressing Intervention New dressing;Other (Comment) 08/02/21 1732  Dressing Change Due 08/09/21 08/02/21 1732    Patient's wife, Darell Saputo, gave consent via telephone, verified by 2 PICC RNs.   Enos Fling 08/02/2021, 5:34 PM

## 2021-08-02 NOTE — Progress Notes (Signed)
ANTICOAGULATION CONSULT NOTE   Pharmacy Consult for heparin Indication: bypass patency / AFib  Allergies  Allergen Reactions   Codeine Anaphylaxis   Phenobarbital Hypertension    Patient Measurements: Height: 5' 7.5" (171.5 cm) Weight: 102.5 kg (225 lb 15.5 oz) IBW/kg (Calculated) : 67.25 HEPARIN DW (KG): 87.6   Vital Signs: Temp: 99.1 F (37.3 C) (09/21 0400) BP: 117/74 (09/21 0600) Pulse Rate: 84 (09/21 0600)  Labs: Recent Labs    07/31/21 1627 07/31/21 1835 07/31/2021 0106 08/04/2021 0710 07/31/2021 1325 07/16/2021 2003 07/17/2021 2044 08/02/21 0456 08/02/21 0645  HGB  --   --  8.4* 8.1*   < > 7.4* 7.5* 9.1*  --   HCT  --   --  25.8* 25.9*   < > 23.3* 22.0* 28.4*  --   PLT  --   --  236 225  --  248  --  246  --   HEPARINUNFRC 0.36  --  0.32  --   --   --   --   --  0.32  CREATININE  --  0.90  --   --   --  0.89  --  0.90  --    < > = values in this interval not displayed.     Estimated Creatinine Clearance (by C-G formula based on SCr of 0.9 mg/dL) Male: 60.9 mL/min Male: 74.1 mL/min  Assessment: 68 male s/p right to left femoral-femoral bypass on 9/16. Pharmacy consulted to dose heparin for bypass patency. He is noted on apixaban PTA (last dose taken 07/17/21).   Pt s/p re-do of bypass 9/20, heparin resumed ovennight. Initial heparin level is therapeutic at 0.32.  Goal of Therapy:  Heparin level 0.3-0.7 units/ml Monitor platelets by anticoagulation protocol: Yes   Plan:  Continue heparin 1650 units/h Daily heparin level and CBC  Arrie Senate, PharmD, Olivia Lopez de Gutierrez, [facility] Clinical Pharmacist [phone] Please check AMION for all [facility] Pharmacy numbers 08/02/2021

## 2021-08-02 NOTE — Progress Notes (Addendum)
Inpatient Diabetes Program Recommendations  AACE/ADA: New Consensus Statement on Inpatient Glycemic Control (2015)  Target Ranges:  Prepandial:   less than 140 mg/dL      Peak postprandial:   less than 180 mg/dL (1-2 hours)      Critically ill patients:  140 - 180 mg/dL   Lab Results  Component Value Date   GLUCAP 135 (H) 08/02/2021   HGBA1C 6.6 (H) 08/02/2021    Review of Glycemic Control Results for Paul [name], Paul [name] (MRN [id number]) as of 08/02/2021 09:54  Ref. Range 08/05/2021 06:01 07/16/2021 10:20 07/13/2021 18:24 08/06/2021 18:24 07/18/2021 19:57 07/13/2021 20:48 08/02/2021 05:02 08/02/2021 08:17  Glucose-Capillary Latest Ref Range: 70 - 99 mg/dL 145 (H) 106 (H) 61 (L) 64 (L) 145 (H) 133 (H) 125 (H) 135 (H)   Inpatient Diabetes Program Recommendations:   While patient is NPO and had hypoglycemia post Novolog correction. -Please consider decrease in Novolog correction to 0-9 units q 4 hrs.  Thank you, Nani Gasser. Makahla Kiser, RN, MSN, CDE  Diabetes Coordinator Inpatient Glycemic Control Team Team Pager [phone] (8am-5pm) 08/02/2021 9:56 AM

## 2021-08-02 NOTE — Progress Notes (Signed)
Progress Note  Patient Name: Paul [name] Date of Encounter: 08/02/2021  [facility] Cardiologist: Kirk Ruths, MD   Subjective   Intubated, sedated  Inpatient Medications    Scheduled Meds:  sodium chloride   Intravenous Once   sodium chloride   Intravenous Once   sodium chloride   Intravenous Once   sodium chloride   Intravenous Once   Chlorhexidine Gluconate Cloth  6 each Topical Daily   dextrose  1 ampule Intravenous Once   docusate  100 mg Per Tube Daily   furosemide  20 mg Oral Daily   gabapentin  600 mg Per Tube QHS   insulin aspart  0-15 Units Subcutaneous TID WC   Continuous Infusions:  sodium chloride     sodium chloride 75 mL/hr at 08/02/21 0400   sodium chloride     dexmedetomidine (PRECEDEX) IV infusion 0.8 mcg/kg/hr (08/02/21 1048)   fentaNYL infusion INTRAVENOUS Stopped (08/02/21 0923)   heparin 1,650 Units/hr (08/02/21 0400)   magnesium sulfate bolus IVPB     phenylephrine (NEO-SYNEPHRINE) Adult infusion Stopped (08/02/21 0902)   PRN Meds: sodium chloride, bisacodyl, fentaNYL, guaiFENesin-dextromethorphan, hydrALAZINE, labetalol, magnesium sulfate bolus IVPB, morphine injection   Vital Signs    Vitals:   08/02/21 0600 08/02/21 0739 08/02/21 0835 08/02/21 0925  BP: 117/74     Pulse: 84     Resp: (!) 28     Temp:      TempSrc:      SpO2: 93% 93% 91% 92%  Weight:      Height:        Intake/Output Summary (Last 24 hours) at 08/02/2021 1048 Last data filed at 08/02/2021 0800 Gross per 24 hour  Intake 8310.92 ml  Output 1335 ml  Net 6975.92 ml   Last 3 Weights 08/02/2021 07/13/2021 08/02/2021  Weight (lbs) 225 lb 15.5 oz 210 lb 15.7 oz 210 lb 15.7 oz  Weight (kg) 102.5 kg 95.7 kg 95.7 kg      Telemetry    Atrial fibrillation with variable rate control- Personally Reviewed  ECG      Physical Exam   GEN: Intubated, sedated Neck: No JVD Cardiac: RRR, no murmurs, rubs, or gallops.  Respiratory: Clear to auscultation  bilaterally. GI: Soft, nontender, non-distended  MS: No edema; No deformity. Neuro:  Nonfocal  Psych: Normal affect   Labs    High Sensitivity Troponin:  No results for input(s): TROPONINIHS in the last 720 hours.   Chemistry Recent Labs  Lab 07/31/21 1835 07/31/2021 1325 08/05/2021 2003 07/20/2021 2044 08/02/21 0456  NA 133*   < > 139 141 142  K 4.4   < > 3.2* 3.4* 3.8  CL 100  --  106  --  108  CO2 21*  --  21*  --  18*  GLUCOSE 156*  --  135*  --  124*  BUN 21  --  21  --  20  CREATININE 0.90  --  0.89  --  0.90  CALCIUM 7.9*  --  8.0*  --  7.9*  MG  --   --  1.8  --  1.8  GFRNONAA >60  --  >60  --  >60  ANIONGAP 12  --  12  --  16*   < > = values in this interval not displayed.    Lipids  Recent Labs  Lab 07/26/2021 0101  CHOL 66  TRIG 45  HDL 35*  LDLCALC 22  CHOLHDL 1.9    Hematology Recent  Labs  Lab 07/27/2021 0710 07/17/2021 1325 07/17/2021 2003 07/25/2021 2044 08/02/21 0456  WBC 12.5*  --  14.9*  --  16.3*  RBC 2.88*  --  2.56*  --  3.18*  HGB 8.1*   < > 7.4* 7.5* 9.1*  HCT 25.9*   < > 23.3* 22.0* 28.4*  MCV 89.9  --  91.0  --  89.3  MCH 28.1  --  28.9  --  28.6  MCHC 31.3  --  31.8  --  32.0  RDW 16.5*  --  16.6*  --  16.8*  PLT 225  --  248  --  246   < > = values in this interval not displayed.   Thyroid No results for input(s): TSH, FREET4 in the last 168 hours.  BNPNo results for input(s): BNP, PROBNP in the last 168 hours.  DDimer No results for input(s): DDIMER in the last 168 hours.   Radiology    DG Abd 1 View  Result Date: 08/04/2021 CLINICAL DATA:  Status post femoral to femoral bypass revision, check gastric catheter placement EXAM: ABDOMEN - 1 VIEW COMPARISON:  None. FINDINGS: Gastric catheter is noted within the stomach. Proximal side port lies near the gastroesophageal junction. Scattered large and small bowel gas is noted. Diffuse aortic calcifications are seen. Findings of prior cholecystectomy and vertebral augmentation are noted.  IMPRESSION: Gastric catheter within the stomach. No other focal abnormality is noted. Electronically Signed   By: Inez Catalina M.D.   On: 07/14/2021 17:10   CT ANGIO AO+BIFEM W & OR WO CONTRAST  Result Date: 07/31/2021 CLINICAL DATA:  Claudication. Concern for femoral femoral bypass graft dysfunction. EXAM: CT ANGIOGRAPHY OF ABDOMINAL AORTA WITH ILIOFEMORAL RUNOFF TECHNIQUE: Multidetector CT imaging of the abdomen, pelvis and lower extremities was performed using the standard protocol during bolus administration of intravenous contrast. Multiplanar CT image reconstructions and MIPs were obtained to evaluate the vascular anatomy. CONTRAST:  134m OMNIPAQUE IOHEXOL 350 MG/ML SOLN COMPARISON:  CT dated 08/11/2021. FINDINGS: VASCULAR Aorta: Advanced atherosclerotic calcification. No aneurysmal dilatation or dissection. Celiac: Atherosclerotic calcification of the celiac artery. The celiac artery and its major branches are patent. SMA: Atherosclerotic calcification of the SMA.  The SMA is patent. Renals: There is atherosclerotic calcification of the renal arteries. The renal arteries remain patent. 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 IMA: The IMA is patent. RIGHT Lower Extremity Inflow: Atherosclerotic calcification of the iliac arteries. The right iliac arteries are patent. Outflow: The common femoral artery, superficial and deep femoral arteries are patent. The popliteal artery is patent. There is atherosclerotic calcification of the femoral and popliteal artery. Runoff: Advanced atherosclerotic calcification of the calf arteries limiting evaluation. The posterior tibial artery appears patent. Evaluation of the anterior tibial and fibular arteries is limited due to suboptimal opacification and timing of the contrast as well as calcification. LEFT Lower Extremity Inflow: The left common iliac artery, internal and external iliac arteries are occluded and thrombosed. A femoral femoral bypass graft  is noted. There is near complete occlusion of the entire length of the graft starting approximally 2 cm distal to the anastomosis with the right femoral artery. There is complex fluid with pockets of air adjacent to the graft in the subcutaneous soft tissues of the anterior pelvic wall which may represent postsurgical seroma and fluid. An infectious process is not excluded. No extravasation of contrast or evidence of active bleed. Outflow: There is reconstitution of the flow in the common femoral artery likely from the  inferior epigastric artery. The superficial and deep femoral arteries are patent. The popliteal artery is patent although with diminished flow. There is atherosclerotic calcification of the femoral and popliteal arteries. Runoff: Very limited evaluation of the calf arteries due to atherosclerotic calcification and suboptimal opacification and timing of the contrast. Duplex ultrasound may provide better evaluation. Veins: No obvious venous abnormality within the limitations of this arterial phase study. Review of the MIP images confirms the above findings. NON-VASCULAR Lower chest: Small bilateral pleural effusions with bibasilar atelectasis or infiltrate. A 2.4 cm nodule in the right lung base. Follow-up as per recommendation of prior CT. Coronary vascular calcifications noted. No intra-abdominal free air or free fluid. Hepatobiliary: Apparent fatty liver. No intrahepatic biliary ductal dilatation. Cholecystectomy. Pancreas: Unremarkable. No pancreatic ductal dilatation or surrounding inflammatory changes. Spleen: Normal in size without focal abnormality. Adrenals/Urinary Tract: Bilateral fat containing adrenal lesions measure up to 7 cm on the right consistent with myelolipoma. There is no hydronephrosis on either side. The visualized ureters appear unremarkable. The urinary bladder is collapsed. Air within the urinary bladder introduced via the catheter. Stomach/Bowel: No bowel obstruction or active  inflammation. The appendix is normal. Lymphatic: No adenopathy. Reproductive: The prostate gland is grossly unremarkable. Other: Diffuse subcutaneous edema. Surgical clips over the anterior pelvic wall. Diffuse scrotal wall edema. Musculoskeletal: Degenerative changes of the spine. No acute osseous pathology. IMPRESSION: 1. Complete occlusion of the femoral-femoral bypass graft. Probable seroma adjacent to the graft. Superimposed infection is not excluded clinical correlation is recommended. 2. Occlusion of the left iliac arteries with reconstitution of the flow in the left common femoral artery from the inferior epigastric artery. 3. Patent bilateral femoral and popliteal arteries. 4. Limited evaluation of the calf arteries. 5. Small bilateral pleural effusions with bibasilar atelectasis or infiltrate. A 2.4 cm nodule in the right lung base. Consider one of the following in 3 months for both low-risk and high-risk individuals: (a) repeat chest CT, (b) follow-up PET-CT, or (c) tissue sampling. This recommendation follows the consensus statement: Guidelines for Management of Incidental Pulmonary Nodules Detected on CT Images: From the Fleischner Society 2017; Radiology 2017; 284:228-243. 6. Bilateral adrenal myelolipomas. 7. No bowel obstruction. Normal appendix. 8. Aortic Atherosclerosis (ICD10-I70.0). Electronically Signed   By: Anner Crete M.D.   On: 07/31/2021 23:16   DG CHEST PORT 1 VIEW  Result Date: 08/11/2021 CLINICAL DATA:  Status post femoral to femoral bypass revision EXAM: PORTABLE CHEST 1 VIEW COMPARISON:  07/23/2021 FINDINGS: Endotracheal tube is noted in satisfactory position approximately 4.5 cm above the carina. Gastric catheter is noted extending into the stomach. Cardiac shadow remains enlarged. Postsurgical changes are again seen. Aortic calcifications are again noted. Lungs are well aerated bilaterally. Some mild atelectatic changes are seen bilaterally. Small left pleural effusion is  noted stable from the prior study. IMPRESSION: Stable bilateral atelectasis. Tubes and lines as described. Small left pleural effusion also stable. Electronically Signed   By: Inez Catalina M.D.   On: 07/13/2021 17:09   DG Chest Port 1V same Day  Result Date: 08/02/2021 CLINICAL DATA:  Postop. EXAM: PORTABLE CHEST 1 VIEW COMPARISON:  06/06/2021; 01/29/2017; chest CT-01/17/2019 FINDINGS: Grossly unchanged enlarged cardiac silhouette and mediastinal contours with atherosclerotic plaque within the thoracic aorta. Stable postoperative change of the left hilum. The lungs remain hyperexpanded with mild diffuse slightly nodular thickening of the pulmonary interstitium. Minimal bibasilar heterogeneous opacities favored to represent atelectasis. Chronic blunting the bilateral costophrenic angles without definite pleural effusion. No pneumothorax. No definite evidence of edema.  No acute osseous abnormalities. IMPRESSION: Similar findings of cardiomegaly, lung hyperexpansion and chronic bronchitic change without superimposed acute cardiopulmonary disease. Electronically Signed   By: Sandi Mariscal M.D.   On: 08/06/2021 10:10    Cardiac Studies   EF 55-60%  Patient Profile     80 y.o. adult with recent vascular surgery and bleeding issue.  Atrial fibrillation with rapid ventricular response.  Assessment & Plan    Atrial fibrillation: Was rate controlled on diltiazem.  Had some hypotension yesterday postop so diltiazem was held.  IV heparin postoperatively.  This will be beneficial for stroke prevention.  Resume diltiazem when blood pressure allows.  Heart rate was reviewed and telemetry showed that it was well controlled overnight.  This morning it has increased.  Should improve as hemoglobin improves and we can get rate control meds back on board.     For questions or updates, please contact [place] Please consult [email or web address] for contact info under        Signed, Larae Grooms, MD  08/02/2021,  10:48 AM

## 2021-08-02 NOTE — Progress Notes (Addendum)
[facility]   Subjective  - Intubated and sedated.   Objective 117/74 84 99.1 F (37.3 C) (!) 28 93%  Intake/Output Summary (Last 24 hours) at 08/02/2021 0706 Last data filed at 08/02/2021 0500 Gross per 24 hour  Intake 5365.92 ml  Output 1860 ml  Net 3505.92 ml   Heart A fib Lungs ventilator supported Doppler signals left DP/AT and right PT/DP signals Groins with ecchymosis, no evidence of hematoma, dry ABD over groin creases     Assessment/Planning: POD # 1 revision of fem-fem bypass 08/10/2021 revision right limb of fem-fem by Dr. Virl Cagey 08/04/2021 Fem-fem bypass by Dr. Carlis Abbott  COPD intubated and sedated on Vent. Heart A fib cont. Heparin and Cardizem for rate control Anemia due to surgical blood loss Post transfusion 4 units PRBC And 2 FFP HGB stable 9.1 Heparin restarted 0400 Neo 130 mcg/min BP systolic 017-209 art line/cuff Urin Op good > 1700 last 24 hr Leukocytosis 16.3 Pending extubation  Roxy Horseman 08/02/2021 7:06 AM --  Laboratory Lab Results: Recent Labs    07/30/2021 2003 07/23/2021 2044 08/02/21 0456  WBC 14.9*  --  16.3*  HGB 7.4* 7.5* 9.1*  HCT 23.3* 22.0* 28.4*  PLT 248  --  246   BMET Recent Labs    08/03/2021 2003 07/20/2021 2044 08/02/21 0456  NA 139 141 142  K 3.2* 3.4* 3.8  CL 106  --  108  CO2 21*  --  18*  GLUCOSE 135*  --  124*  BUN 21  --  20  CREATININE 0.89  --  0.90  CALCIUM 8.0*  --  7.9*    COAG Lab Results  Component Value Date   INR 1.0 07/25/2021   INR 1.0 07/20/2021   INR 0.99 08/09/2016   No results found for: PTT  I have interviewed the patient and examined the patient. I agree with the findings by the PA.  Good Doppler signals in both feet.  The biggest challenge will be keeping dry gauze on the left groin incision which is right at the crease.  Greatly appreciate critical care medicine's help.  Gae Gallop, MD

## 2021-08-02 NOTE — Progress Notes (Signed)
Failed SBT CXR witih edema, bedside US without significant effusions Will push diuresis for a day and try again tomorrow.  Erskine Emery MD PCCM

## 2021-08-02 NOTE — Progress Notes (Signed)
NAME:  Paul [name], MRN:  [id number], DOB:  August 09, 1941, LOS: 5 ADMISSION DATE:  08/09/2021, CONSULTATION DATE:  07/21/2021 REFERRING MD:  Dr. Scot Dock, CHIEF COMPLAINT:     History of Present Illness:  HPI obtained from medical chart review as patient remains sedated and intubated on mechanical ventilation.   80 year old male with medical history of tobacco abuse, COPD, prior lung cancer s/p wedge resection LLL 2007, PE (2013), PAD, CAD, afib on Eliquis, DM, HTN, and HLD admitted to vascular surgery on 9/16 for right to left femorofemoral bypass after left great toe wound x 3 months with unsuccessful retrograde and antegrade attempts from brachial access to cross left common iliac occlusion by Dr. Fletcher Anon on 07/19/21.  Patient is followed in our office by Dr. Melvyn Novas.  He continues to smoke.     Patient underwent right to left femoral artery bypass on 9/16.  Course complicated by postop bleeding and hematoma requiring return to the OR on 9/17 for bilateral groin exploration, right iliofemoral embolectomy, left iliofemoral embolectomy, and femoral-femoral bypass revision.  Patient was seen by Cardiology on 9/19 for atrial fibrillation with rapid ventricular rate in which Cardizem was added and maintained on heparin gtt per pharmacy.  Also noted during this time patient had some shortness of breath with CXR showing lung hyperexpansion and chronic bronchitic changes and minimal bibasilar atelectasis without acute process.  He was given lasix with reported improvement.  However, he was found on repeat imaging to have re-occluded bypass prompting return third return to OR on 9/20 for redo of right to left femorofemoral bypass.  He returns the ICU on mechanical ventilation, PCCM consulted for vent management.    Pertinent  Medical History  Ongoing tobacco abuse, COPD, GERD, DM, CAD s/p PCI 1988, Afib on Eliquis, PAD, HTN, HLD, PE 2013, Stage IA (T1, N0, MX) non-small cell lung cancer, adenocarcinoma s/p wedge  resection of LLL and seed implant 01/2006  Significant Hospital Events: Including procedures, antibiotic start and stop dates in addition to other pertinent events   9/16 admitted for right to left femoral artery bypass on 9/16 9/17 post op hematoma/ bleeding, back to OR for s/p bilateral groin exploration, right iliofemoral embolectomy, left iliofemoral embolectomy, femoral-femoral bypass revision 9/19 cards consulted for Afib with RVR-> started on cardizem.  SOB s/p lasix 9/20 CT showing re-occlusion -> back to OR for redo bypass, returns to ICU on mechanical ventilation; PCCM consulted  Interim History / Subjective:  O2 needs improved. Lightening sedation which is improving Bps.  Objective   Blood pressure 117/74, pulse 84, temperature 99.1 F (37.3 C), resp. rate (!) 28, height 5' 7.5" (1.715 m), weight 102.5 kg, SpO2 93 %.    Vent Mode: PRVC FiO2 (%):  [40 %-80 %] 40 % Set Rate:  [20 bmp-28 bmp] 28 bmp Vt Set:  [530 mL] 530 mL PEEP:  [10 cmH20] 10 cmH20 Plateau Pressure:  [16 cmH20-23 cmH20] 23 cmH20   Intake/Output Summary (Last 24 hours) at 08/02/2021 0823 Last data filed at 08/02/2021 0800 Gross per 24 hour  Intake 8310.92 ml  Output 1610 ml  Net 6700.92 ml    Filed Weights   08/03/2021 0500 07/27/2021 1053 08/02/21 0454  Weight: 95.7 kg 95.7 kg 102.5 kg    Examination: Chronically ill man on vent Heart sounds regular Ext with dopplerable pulses, warm to touch Stable bruising/swelling over pubic area and R groin Abdomen soft, hypoactive BS Lungs clear  CBC stable BMP stable CXR pending  Resolved  Hospital Problem list    Assessment & Plan:   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  Course complicated by postop bleeding prompting return OR trip 9/17 with revision  including right and left iliofemoral embolectomy.  Course further complicated by occluded bypass prompting second OR trip 9/20 for redo of R to L femoral  - femoral bypass. - Post op care per vascular surgery.   Postop respiratory failure- due to inability to protect the airway in the setting of above.  High O2 needs likely related to derecruitment intraop, improved today. Hx COPD. - Bronchodilators, wean to extubate today hopefully   Hx A.fib. - Continue Heparin,. - Cardizem on hold for hypotension - Transition to Eliquis when cleared by vascular and cards.   Hx HTN, HLD. - Continue Labetalol PRN. - Hold home Furosemide, Losartan, cardizem   AoC anemia. - Stable today, monitor   Hx DM. - SSI. - Hold home Metformin, Empagliflozin.  Right basilar lung nodule  - 2.4 cm  RLL nodule, almost appears spiculated, seen on CT 08/10/2021. Previous hx of tage IA (T1, N0, MX) non-small cell lung cancer, adenocarcinoma s/p wedge resection and seed implants of LLL 01/2006.  Appears to last been seen by oncology in 2015.  Can f/u with oncology or follow up in our office with either Dr. Valeta Harms or Dr. Lamonte Sakai for further diagnostic evaluation.    Best Practice (right click and "Reselect all SmartList Selections" daily)   Diet/type: NPO DVT prophylaxis: systemic heparin GI prophylaxis: PPI Lines: N/A Foley:  Yes, and it is still needed Code Status:  full code Last date of multidisciplinary goals of care discussion [per primary]  35 min cc time

## 2021-08-03 ENCOUNTER — Inpatient Hospital Stay (HOSPITAL_COMMUNITY): Payer: Medicare Other

## 2021-08-03 ENCOUNTER — Ambulatory Visit: Payer: Medicare Other | Admitting: Podiatry

## 2021-08-03 DIAGNOSIS — R6521 Severe sepsis with septic shock: Secondary | ICD-10-CM | POA: Diagnosis not present

## 2021-08-03 DIAGNOSIS — J9601 Acute respiratory failure with hypoxia: Secondary | ICD-10-CM | POA: Diagnosis not present

## 2021-08-03 DIAGNOSIS — T82868A Thrombosis of vascular prosthetic devices, implants and grafts, initial encounter: Secondary | ICD-10-CM

## 2021-08-03 DIAGNOSIS — R609 Edema, unspecified: Secondary | ICD-10-CM

## 2021-08-03 DIAGNOSIS — I4821 Permanent atrial fibrillation: Secondary | ICD-10-CM | POA: Diagnosis not present

## 2021-08-03 DIAGNOSIS — Z9889 Other specified postprocedural states: Secondary | ICD-10-CM

## 2021-08-03 DIAGNOSIS — I2609 Other pulmonary embolism with acute cor pulmonale: Secondary | ICD-10-CM

## 2021-08-03 DIAGNOSIS — A419 Sepsis, unspecified organism: Secondary | ICD-10-CM | POA: Diagnosis not present

## 2021-08-03 DIAGNOSIS — I739 Peripheral vascular disease, unspecified: Secondary | ICD-10-CM | POA: Diagnosis not present

## 2021-08-03 LAB — POCT I-STAT 7, (LYTES, BLD GAS, ICA,H+H)
Acid-Base Excess: 1 mmol/L (ref 0.0–2.0)
Acid-base deficit: 11 mmol/L — ABNORMAL HIGH (ref 0.0–2.0)
Acid-base deficit: 12 mmol/L — ABNORMAL HIGH (ref 0.0–2.0)
Bicarbonate: 15 mmol/L — ABNORMAL LOW (ref 20.0–28.0)
Bicarbonate: 14.2 mmol/L — ABNORMAL LOW (ref 20.0–28.0)
Bicarbonate: 24.7 mmol/L (ref 20.0–28.0)
Calcium, Ion: 1.14 mmol/L — ABNORMAL LOW (ref 1.15–1.40)
Calcium, Ion: 1.09 mmol/L — ABNORMAL LOW (ref 1.15–1.40)
Calcium, Ion: 1.08 mmol/L — ABNORMAL LOW (ref 1.15–1.40)
HCT: 25 % — ABNORMAL LOW (ref 39.0–52.0)
HCT: 25 % — ABNORMAL LOW (ref 39.0–52.0)
HCT: 24 % — ABNORMAL LOW (ref 39.0–52.0)
Hemoglobin: 8.5 g/dL — ABNORMAL LOW (ref 13.0–17.0)
Hemoglobin: 8.5 g/dL — ABNORMAL LOW (ref 13.0–17.0)
Hemoglobin: 8.2 g/dL — ABNORMAL LOW (ref 13.0–17.0)
O2 Saturation: 93 %
O2 Saturation: 99 %
O2 Saturation: 95 %
Patient temperature: 38.2
Patient temperature: 38
Patient temperature: 37.5
Potassium: 3.6 mmol/L (ref 3.5–5.1)
Potassium: 3.5 mmol/L (ref 3.5–5.1)
Potassium: 3 mmol/L — ABNORMAL LOW (ref 3.5–5.1)
Sodium: 147 mmol/L — ABNORMAL HIGH (ref 135–145)
Sodium: 151 mmol/L — ABNORMAL HIGH (ref 135–145)
Sodium: 153 mmol/L — ABNORMAL HIGH (ref 135–145)
TCO2: 16 mmol/L — ABNORMAL LOW (ref 22–32)
TCO2: 15 mmol/L — ABNORMAL LOW (ref 22–32)
TCO2: 26 mmol/L (ref 22–32)
pCO2 arterial: 33.5 mmHg (ref 32.0–48.0)
pCO2 arterial: 31.9 mmHg — ABNORMAL LOW (ref 32.0–48.0)
pCO2 arterial: 35.3 mmHg (ref 32.0–48.0)
pH, Arterial: 7.265 — ABNORMAL LOW (ref 7.350–7.450)
pH, Arterial: 7.261 — ABNORMAL LOW (ref 7.350–7.450)
pH, Arterial: 7.455 — ABNORMAL HIGH (ref 7.350–7.450)
pO2, Arterial: 81 mmHg — ABNORMAL LOW (ref 83.0–108.0)
pO2, Arterial: 167 mmHg — ABNORMAL HIGH (ref 83.0–108.0)
pO2, Arterial: 71 mmHg — ABNORMAL LOW (ref 83.0–108.0)

## 2021-08-03 LAB — BASIC METABOLIC PANEL
Anion gap: 17 — ABNORMAL HIGH (ref 5–15)
Anion gap: 9 (ref 5–15)
BUN: 30 mg/dL — ABNORMAL HIGH (ref 8–23)
BUN: 38 mg/dL — ABNORMAL HIGH (ref 8–23)
CO2: 16 mmol/L — ABNORMAL LOW (ref 22–32)
CO2: 25 mmol/L (ref 22–32)
Calcium: 7.9 mg/dL — ABNORMAL LOW (ref 8.9–10.3)
Calcium: 7.7 mg/dL — ABNORMAL LOW (ref 8.9–10.3)
Chloride: 110 mmol/L (ref 98–111)
Chloride: 115 mmol/L — ABNORMAL HIGH (ref 98–111)
Creatinine, Ser: 1.08 mg/dL (ref 0.61–1.24)
Creatinine, Ser: 0.98 mg/dL (ref 0.61–1.24)
GFR, Estimated: >60 mL/min (ref >60)
GFR, Estimated: >60 mL/min (ref >60)
Glucose, Bld: 164 mg/dL — ABNORMAL HIGH (ref 70–99)
Glucose, Bld: 231 mg/dL — ABNORMAL HIGH (ref 70–99)
Potassium: 3.6 mmol/L (ref 3.5–5.1)
Potassium: 2.9 mmol/L — ABNORMAL LOW (ref 3.5–5.1)
Sodium: 143 mmol/L (ref 135–145)
Sodium: 149 mmol/L — ABNORMAL HIGH (ref 135–145)

## 2021-08-03 LAB — LACTIC ACID, PLASMA
Lactic Acid, Venous: 1.1 mmol/L (ref 0.5–1.9)
Lactic Acid, Venous: 1.1 mmol/L (ref 0.5–1.9)

## 2021-08-03 LAB — ECHOCARDIOGRAM LIMITED
Height: 67.5 in
S' Lateral: 3.8 cm
Weight: 3597.91 oz

## 2021-08-03 LAB — PHOSPHORUS
Phosphorus: 3.1 mg/dL (ref 2.5–4.6)
Phosphorus: 1.7 mg/dL — ABNORMAL LOW (ref 2.5–4.6)

## 2021-08-03 LAB — GLUCOSE, CAPILLARY
Glucose-Capillary: 130 mg/dL — ABNORMAL HIGH (ref 70–99)
Glucose-Capillary: 159 mg/dL — ABNORMAL HIGH (ref 70–99)
Glucose-Capillary: 162 mg/dL — ABNORMAL HIGH (ref 70–99)
Glucose-Capillary: 187 mg/dL — ABNORMAL HIGH (ref 70–99)
Glucose-Capillary: 196 mg/dL — ABNORMAL HIGH (ref 70–99)
Glucose-Capillary: 205 mg/dL — ABNORMAL HIGH (ref 70–99)

## 2021-08-03 LAB — CBC
HCT: 27.2 % — ABNORMAL LOW (ref 39.0–52.0)
Hemoglobin: 8.4 g/dL — ABNORMAL LOW (ref 13.0–17.0)
MCH: 28 pg (ref 26.0–34.0)
MCHC: 30.9 g/dL (ref 30.0–36.0)
MCV: 90.7 fL (ref 80.0–100.0)
Platelets: 167 10*3/uL (ref 150–400)
RBC: 3 MIL/uL — ABNORMAL LOW (ref 4.22–5.81)
RDW: 17.1 % — ABNORMAL HIGH (ref 11.5–15.5)
WBC: 8.5 10*3/uL (ref 4.0–10.5)
nRBC: 0.7 % — ABNORMAL HIGH (ref 0.0–0.2)

## 2021-08-03 LAB — URINALYSIS, MICROSCOPIC (REFLEX)

## 2021-08-03 LAB — PROCALCITONIN: Procalcitonin: 1.8 ng/mL

## 2021-08-03 LAB — MAGNESIUM
Magnesium: 2.1 mg/dL (ref 1.7–2.4)
Magnesium: 2.1 mg/dL (ref 1.7–2.4)

## 2021-08-03 LAB — URINALYSIS, ROUTINE W REFLEX MICROSCOPIC
Bilirubin Urine: NEGATIVE
Glucose, UA: >=500 mg/dL — AB
Ketones, ur: 15 mg/dL — AB
Nitrite: NEGATIVE
Protein, ur: NEGATIVE mg/dL
Specific Gravity, Urine: 1.015 (ref 1.005–1.030)
pH: 5.5 (ref 5.0–8.0)

## 2021-08-03 LAB — HEPARIN LEVEL (UNFRACTIONATED): Heparin Unfractionated: 0.44 IU/mL (ref 0.30–0.70)

## 2021-08-03 MED ORDER — FUROSEMIDE 10 MG/ML IJ SOLN
60.0000 mg | Freq: Three times a day (TID) | INTRAMUSCULAR | Status: AC
  Administered 2021-08-03 (×2): 60 mg via INTRAVENOUS
  Filled 2021-08-03 (×2): qty 6

## 2021-08-03 MED ORDER — STERILE WATER FOR INJECTION IV SOLN
INTRAVENOUS | Status: AC
  Filled 2021-08-03 (×5): qty 1000

## 2021-08-03 MED ORDER — SODIUM CHLORIDE 0.9 % IV SOLN
2.0000 g | INTRAVENOUS | Status: DC
  Filled 2021-08-03: qty 20

## 2021-08-03 MED ORDER — SODIUM CHLORIDE 0.9 % IV SOLN
2.0000 g | Freq: Two times a day (BID) | INTRAVENOUS | Status: DC
  Administered 2021-08-03 – 2021-08-04 (×3): 2 g via INTRAVENOUS
  Filled 2021-08-03 (×3): qty 2

## 2021-08-03 MED ORDER — ACETAMINOPHEN 160 MG/5ML PO SOLN
650.0000 mg | Freq: Four times a day (QID) | ORAL | Status: DC | PRN
  Administered 2021-08-03 – 2021-08-05 (×6): 650 mg
  Filled 2021-08-03 (×6): qty 20.3

## 2021-08-03 MED ORDER — VITAL 1.5 CAL PO LIQD
1000.0000 mL | ORAL | Status: DC
  Administered 2021-08-03 – 2021-08-06 (×5): 1000 mL

## 2021-08-03 MED ORDER — INSULIN ASPART 100 UNIT/ML IJ SOLN
0.0000 [IU] | INTRAMUSCULAR | Status: DC
  Administered 2021-08-03 (×2): 3 [IU] via SUBCUTANEOUS
  Administered 2021-08-03: 5 [IU] via SUBCUTANEOUS
  Administered 2021-08-03: 2 [IU] via SUBCUTANEOUS
  Administered 2021-08-04: 3 [IU] via SUBCUTANEOUS

## 2021-08-03 MED ORDER — POTASSIUM PHOSPHATES 15 MMOLE/5ML IV SOLN
30.0000 mmol | Freq: Once | INTRAVENOUS | Status: AC
  Administered 2021-08-04: 30 mmol via INTRAVENOUS
  Filled 2021-08-03: qty 10

## 2021-08-03 MED ORDER — PROSOURCE TF PO LIQD
45.0000 mL | Freq: Two times a day (BID) | ORAL | Status: DC
  Administered 2021-08-03: 45 mL
  Filled 2021-08-03: qty 45

## 2021-08-03 MED ORDER — POTASSIUM CHLORIDE 20 MEQ PO PACK
40.0000 meq | PACK | Freq: Once | ORAL | Status: AC
  Administered 2021-08-03: 40 meq
  Filled 2021-08-03: qty 2

## 2021-08-03 MED ORDER — PANTOPRAZOLE 2 MG/ML SUSPENSION
40.0000 mg | Freq: Every day | ORAL | Status: DC
  Administered 2021-08-03 – 2021-08-07 (×5): 40 mg
  Filled 2021-08-03 (×5): qty 20

## 2021-08-03 MED ORDER — PERFLUTREN LIPID MICROSPHERE
1.0000 mL | INTRAVENOUS | Status: AC | PRN
Start: 2021-08-03 — End: 2021-08-03
  Administered 2021-08-03: 2 mL via INTRAVENOUS
  Filled 2021-08-03: qty 10

## 2021-08-03 MED ORDER — SODIUM BICARBONATE 8.4 % IV SOLN
100.0000 meq | Freq: Once | INTRAVENOUS | Status: AC
  Administered 2021-08-03: 100 meq via INTRAVENOUS
  Filled 2021-08-03: qty 50

## 2021-08-03 MED ORDER — POTASSIUM CHLORIDE CRYS ER 20 MEQ PO TBCR: 40.0000 meq | EXTENDED_RELEASE_TABLET | Freq: Once | ORAL | Status: DC

## 2021-08-03 MED ORDER — PROSOURCE TF PO LIQD
45.0000 mL | Freq: Three times a day (TID) | ORAL | Status: DC
  Administered 2021-08-03 – 2021-08-07 (×12): 45 mL
  Filled 2021-08-03 (×11): qty 45

## 2021-08-03 MED ORDER — VITAL HIGH PROTEIN PO LIQD
1000.0000 mL | ORAL | Status: DC
  Administered 2021-08-03: 1000 mL

## 2021-08-03 NOTE — Progress Notes (Signed)
Initial Nutrition Assessment  DOCUMENTATION CODES:   Non-severe (moderate) malnutrition in context of chronic illness  INTERVENTION:   Tube feeding -Vital 1.5 @ 20 ml/hr via OG -Increase by 10 ml Q6 hours to goal rate of 55 ml/hr (1320 ml) -ProSource TF 45 ml TID  Provides: 2100 kcals, 122 grams protein, 1008 ml free water.   NUTRITION DIAGNOSIS:   Moderate Malnutrition related to chronic illness as evidenced by moderate muscle depletion, severe muscle depletion.  GOAL:   Patient will meet greater than or equal to 90% of their needs  MONITOR:   Labs, I & O's, Vent status, Weight trends, TF tolerance, Skin  REASON FOR ASSESSMENT:   Consult, Ventilator Enteral/tube feeding initiation and management  ASSESSMENT:   Patient with PMH significant for COPD, GERD, DM, CAD s/p PCI, PAD, HTN, HLD, NSCLC s/p wedge resection of LLL/seed implant 2007, and memory loss. Presents this admission with LLE limb ischemia with occluded L iliac artery.  9/16- s/p R to L fem-fem bypass with graft 9/17- post-op bleeding, back to OR, s/p bilateral groin exploration, bilateral iliofemoral embolectomy, fem-fem bypass revision 9/20- occluded fem-fem bypass graft, s/p redo  Mental status fluctuating. Failed SBT yesterday. Attempting diuresis with lasix. Xray confirms OG located in the stomach. Okay to start tube feeding. Titrate to goal as patient is at risk for refeeding.   Records indicate patient weighed 100.2 kg one year ago and 95.7 kg this admission (insignificant loss for time frame). Utilize 95.7 kg as EDW for now.   UOP: 2900 ml x 24 hrs   Drips: NS @ 72 ml/hr, precedex, sodium bicarb, neosynephrine  Medications: colace, 60 mg lasix BID, SS novolog Labs: CBG 118-162  NUTRITION - FOCUSED PHYSICAL EXAM:  Flowsheet Row Most Recent Value  Orbital Region Mild depletion  Upper Arm Region Moderate depletion  Thoracic and Lumbar Region Unable to assess  Buccal Region Moderate depletion   Temple Region Severe depletion  Clavicle Bone Region Moderate depletion  Clavicle and Acromion Bone Region Severe depletion  Scapular Bone Region Unable to assess  Dorsal Hand Unable to assess  [mits]  Patellar Region Moderate depletion  Anterior Thigh Region Moderate depletion  Posterior Calf Region Moderate depletion  Edema (RD Assessment) Moderate  Hair Reviewed  Eyes Unable to assess  Mouth Unable to assess  Skin Reviewed  Nails Unable to assess      Diet Order:   Diet Order             Diet NPO time specified Except for: Sips with Meds  Diet effective now                   EDUCATION NEEDS:   Not appropriate for education at this time  Skin:  Skin Assessment: Skin Integrity Issues: Skin Integrity Issues:: Other (Comment), Incisions, Stage I Stage I: sacrum Incisions: bilateral groin Other: skin tear penis  Last BM:  9/21  Height:   Ht Readings from Last 1 Encounters:  07/22/2021 5' 7.5" (1.715 m)    Weight:   Wt Readings from Last 1 Encounters:  08/03/21 102 kg    BMI:  Body mass index is 34.7 kg/m.  Estimated Nutritional Needs:   Kcal:  2100-2300 kcal  Protein:  105-120 grams  Fluid:  >/= 2 L/day  Mariana Single MS, RD, LDN, CNSC Clinical Nutrition Pager listed in [place]

## 2021-08-03 NOTE — Progress Notes (Signed)
More acidotic, febrile. UA postiive, will start cefepime.  Would change out foley but swelling would make replacement difficult. Lactate normal which is reassuring UA with ketones which could explain some of gap acidemia I don't think he is in DKA, question starvation from multiple days of poor PO Spoke with wife at bedside, she is getting pretty overwhelmed with the multiple setbacks, we may want to consider palliative consult if continues to languish.  Erskine Emery MD PCCM

## 2021-08-03 NOTE — Progress Notes (Signed)
[facility] Progress Note Patient Name: Paul [name] DOB: [birth year] MRN: [id number]   Date of Service  08/03/2021  HPI/Events of Note  Multiple issues: 1. Bradycardia - HR drops to 30's episodically. Likely related to Precedex IV infusion. 2. Hypokalemia  Hypophosphatemia - K+ = 2.9,PO4--- = 1.7 and Creatinine = 0.98.  eICU Interventions   Plan: Replace K+ and PO4---. Agree with weaning down Precedex IV infusion and increasing Fentanyl IV infusion as needed.     Intervention Category Major Interventions: Arrhythmia - evaluation and management;Electrolyte abnormality - evaluation and management  [name] Eugene 08/03/2021, 11:32 PM

## 2021-08-03 NOTE — Progress Notes (Signed)
NAME:  Paul [name], MRN:  [id number], DOB:  05-10-1941, LOS: 6 ADMISSION DATE:  08/06/2021, CONSULTATION DATE:  07/19/2021 REFERRING MD:  Dr. Scot Dock, CHIEF COMPLAINT:     History of Present Illness:  HPI obtained from medical chart review as patient remains sedated and intubated on mechanical ventilation.   80 year old male with medical history of tobacco abuse, COPD, prior lung cancer s/p wedge resection LLL 2007, PE (2013), PAD, CAD, afib on Eliquis, DM, HTN, and HLD admitted to vascular surgery on 9/16 for right to left femorofemoral bypass after left great toe wound x 3 months with unsuccessful retrograde and antegrade attempts from brachial access to cross left common iliac occlusion by Dr. Fletcher Anon on 07/19/21.  Patient is followed in our office by Dr. Melvyn Novas.  He continues to smoke.     Patient underwent right to left femoral artery bypass on 9/16.  Course complicated by postop bleeding and hematoma requiring return to the OR on 9/17 for bilateral groin exploration, right iliofemoral embolectomy, left iliofemoral embolectomy, and femoral-femoral bypass revision.  Patient was seen by Cardiology on 9/19 for atrial fibrillation with rapid ventricular rate in which Cardizem was added and maintained on heparin gtt per pharmacy.  Also noted during this time patient had some shortness of breath with CXR showing lung hyperexpansion and chronic bronchitic changes and minimal bibasilar atelectasis without acute process.  He was given lasix with reported improvement.  However, he was found on repeat imaging to have re-occluded bypass prompting return third return to OR on 9/20 for redo of right to left femorofemoral bypass.  He returns the ICU on mechanical ventilation, PCCM consulted for vent management.    Pertinent  Medical History  Ongoing tobacco abuse, COPD, GERD, DM, CAD s/p PCI 1988, Afib on Eliquis, PAD, HTN, HLD, PE 2013, Stage IA (T1, N0, MX) non-small cell lung cancer, adenocarcinoma s/p wedge  resection of LLL and seed implant 01/2006  Significant Hospital Events: Including procedures, antibiotic start and stop dates in addition to other pertinent events   9/16 admitted for right to left femoral artery bypass on 9/16 9/17 post op hematoma/ bleeding, back to OR for s/p bilateral groin exploration, right iliofemoral embolectomy, left iliofemoral embolectomy, femoral-femoral bypass revision 9/19 cards consulted for Afib with RVR-> started on cardizem.  SOB s/p lasix 9/20 CT showing re-occlusion -> back to OR for redo bypass, returns to ICU on mechanical ventilation; PCCM consulted  Interim History / Subjective:  Remains pretty out of it still. Failed SBT yesterday. Did not really respond to diuretics  Objective   Blood pressure 107/74, pulse 70, temperature (!) 100.8 F (38.2 C), resp. rate (!) 22, height 5' 7.5" (1.715 m), weight 102 kg, SpO2 100 %.    Vent Mode: PRVC FiO2 (%):  [40 %] 40 % Set Rate:  [28 bmp] 28 bmp Vt Set:  [530 mL] 530 mL PEEP:  [5 cmH20-10 cmH20] 10 cmH20 Plateau Pressure:  [18 cmH20-24 cmH20] 21 cmH20   Intake/Output Summary (Last 24 hours) at 08/03/2021 0827 Last data filed at 08/03/2021 0641 Gross per 24 hour  Intake 3682.46 ml  Output 2750 ml  Net 932.46 ml    Filed Weights   07/19/2021 1053 08/02/21 0454 08/03/21 0353  Weight: 95.7 kg 102.5 kg 102 kg    Examination: Chronically ill man on vent Heart sounds regular Ext with dopplerable pulses, warm to touch Stable bruising/swelling over pubic area and R groin Abdomen soft, hypoactive BS Lungs diminished +JVD  WBC  improved H/H stable Plts down slightly BUN/Cr up slightly  His bicarb keeps dropping for unclear reason, +gap acidosis Has low grade fevers   Resolved Hospital Problem list    Assessment & Plan:   LLE limb ischemia with occluded left iliac artery - s/p R to L femoral artery bypass 9/16.  Course complicated by postop bleeding prompting return OR trip 9/17 with revision   including right and left iliofemoral embolectomy.  Course further complicated by occluded bypass prompting second OR trip 9/20 for redo of R to L femoral - femoral bypass. - Post op care per vascular surgery.   Postop respiratory failure Hx COPD.  2017 PFT showing mild obstruction, normal lung volumes but markedly reduced DLCO c/w advanced emphysema phenotype. Group 3 pulm HTN/RV dysfunction. He has metabolic acidosis but has been off vent and able to tolerate this prior so do not think he need to chase this.  Will try SBT and maybe just try extubation regardless of RSBI to see if he can tolerate.  Continue Bds as well.  Will give another couple doses of lasix, he still looks volume up.  Hx A.fib. - Continue Heparin,. - Cardizem on hold for hypotension - Transition to Eliquis when cleared by vascular and cards.   Hx HTN, HLD. - Continue Labetalol PRN. - Hold home Furosemide, Losartan, cardizem   AoC anemia. - Stable today, monitor  Anion gap acidosis- unclear etiology; check lactate and urine for ketones.   Metformin could be culprit but on hold since 9/21.   Hx DM. - SSI. - Hold home Metformin, Empagliflozin.  Right basilar lung nodule  - 2.4 cm  RLL nodule, almost appears spiculated, seen on CT 07/30/2021. Previous hx of tage IA (T1, N0, MX) non-small cell lung cancer, adenocarcinoma s/p wedge resection and seed implants of LLL 01/2006.  Appears to last been seen by oncology in 2015.  Can f/u with oncology or follow up in our office with either Dr. Valeta Harms or Dr. Lamonte Sakai for further diagnostic evaluation.    Best Practice (right click and "Reselect all SmartList Selections" daily)   Diet/type: NPO DVT prophylaxis: systemic heparin GI prophylaxis: PPI Lines: PICC Foley:  Yes, and it is still needed Code Status:  full code Last date of multidisciplinary goals of care discussion [per primary]  33 min cc time Erskine Emery MD

## 2021-08-03 NOTE — Progress Notes (Signed)
BLE venous duplex has been completed.  Clydene Fake, RN given preliminary results.  Results can be found under chart review under CV PROC. 08/03/2021 4:17 PM Kourtnei Rauber RVT, RDMS

## 2021-08-03 NOTE — Progress Notes (Addendum)
ANTICOAGULATION CONSULT NOTE   Pharmacy Consult for heparin Indication: bypass patency / AFib  Allergies  Allergen Reactions   Codeine Anaphylaxis   Phenobarbital Hypertension    Patient Measurements: Height: 5' 7.5" (171.5 cm) Weight: 102 kg (224 lb 13.9 oz) IBW/kg (Calculated) : 67.25 HEPARIN DW (KG): 87.6   Vital Signs: Temp: 100.8 F (38.2 C) (09/22 0645) Temp Source: Esophageal (09/22 0400) BP: 107/74 (09/22 0600) Pulse Rate: 70 (09/22 0645)  Labs: Recent Labs    08/10/2021 0106 08/04/2021 0710 07/13/2021 2003 08/11/2021 2044 08/02/21 0456 08/02/21 0645 08/03/21 0348  HGB 8.4*   < > 7.4* 7.5* 9.1*  --  8.4*  HCT 25.8*   < > 23.3* 22.0* 28.4*  --  27.2*  PLT 236   < > 248  --  246  --  167  HEPARINUNFRC 0.32  --   --   --   --  0.32 0.44  CREATININE  --   --  0.89  --  0.90  --  1.08   < > = values in this interval not displayed.     Estimated Creatinine Clearance (by C-G formula based on SCr of 1.08 mg/dL) Male: 50.6 mL/min Male: 61.6 mL/min  Assessment: 80 male s/p right to left femoral-femoral bypass on 9/16. Pharmacy consulted to dose heparin for bypass patency. He is noted on apixaban PTA (last dose taken 07/17/21).   Pt s/p re-do of bypass 9/20, heparin resumed. Heparin level remains therapeutic at 0.44. Hgb down slightly, pltc down - will need to watch closely.  Goal of Therapy:  Heparin level 0.3-0.7 units/ml Monitor platelets by anticoagulation protocol: Yes   Plan:  Continue heparin 1650 units/h Daily heparin level and CBC   ADDENDUM: Pt with diffuse bleeding from femoral site, heparin held for now.   Arrie Senate, PharmD, BCPS, [facility] Clinical Pharmacist [phone] Please check AMION for all [place] numbers 08/03/2021

## 2021-08-03 NOTE — Progress Notes (Signed)
Progress Note  Patient Name: Paul [name] Date of Encounter: 08/03/2021  [facility] Cardiologist: Kirk Ruths, MD   Subjective   Intubated, sedated  Inpatient Medications    Scheduled Meds:  sodium chloride   Intravenous Once   sodium chloride   Intravenous Once   sodium chloride   Intravenous Once   sodium chloride   Intravenous Once   chlorhexidine gluconate (MEDLINE KIT)  15 mL Mouth Rinse BID   Chlorhexidine Gluconate Cloth  6 each Topical Daily   dextrose  1 ampule Intravenous Once   docusate  100 mg Per Tube Daily   feeding supplement (PROSource TF)  45 mL Per Tube BID   feeding supplement (VITAL HIGH PROTEIN)  1,000 mL Per Tube Q24H   furosemide  60 mg Intravenous Q8H   gabapentin  600 mg Per Tube QHS   insulin aspart  0-15 Units Subcutaneous Q4H   mouth rinse  15 mL Mouth Rinse 10 times per day   pantoprazole sodium  40 mg Per Tube Daily   sodium chloride flush  10-40 mL Intracatheter Q12H   Continuous Infusions:  sodium chloride     sodium chloride 75 mL/hr at 08/03/21 1000   sodium chloride     dexmedetomidine (PRECEDEX) IV infusion 0.6 mcg/kg/hr (08/03/21 1000)   fentaNYL infusion INTRAVENOUS 25 mcg/hr (08/03/21 1112)   magnesium sulfate bolus IVPB     phenylephrine (NEO-SYNEPHRINE) Adult infusion 10 mcg/min (08/03/21 1000)   PRN Meds: sodium chloride, acetaminophen, bisacodyl, fentaNYL, guaiFENesin-dextromethorphan, hydrALAZINE, labetalol, magnesium sulfate bolus IVPB, morphine injection, sodium chloride flush   Vital Signs    Vitals:   08/03/21 1015 08/03/21 1030 08/03/21 1045 08/03/21 1138  BP:      Pulse: (!) 102 86 91   Resp: 19 (!) 26 (!) 30   Temp: (!) 100.9 F (38.3 C) (!) 100.8 F (38.2 C) (!) 100.4 F (38 C)   TempSrc:      SpO2: 90% 95% 94% 91%  Weight:      Height:        Intake/Output Summary (Last 24 hours) at 08/03/2021 1154 Last data filed at 08/03/2021 1000 Gross per 24 hour  Intake 4169.29 ml  Output 2900 ml   Net 1269.29 ml   Last 3 Weights 08/03/2021 08/02/2021 07/18/2021  Weight (lbs) 224 lb 13.9 oz 225 lb 15.5 oz 210 lb 15.7 oz  Weight (kg) 102 kg 102.5 kg 95.7 kg      Telemetry    AFib, controlled rate - Personally Reviewed  ECG      Physical Exam   GEN: No acute distress.  Intubated sedated Neck: No JVD Cardiac: irregularly irregular, no murmurs, rubs, or gallops.  Respiratory: Clear to auscultation bilaterally. GI: Soft, obese; warm rash on lower abdomen MS: foot in boots Neuro:  Nonfocal  Psych: intubated sedated  Labs    High Sensitivity Troponin:  No results for input(s): TROPONINIHS in the last 720 hours.   Chemistry Recent Labs  Lab 07/27/2021 2003 08/06/2021 2044 08/02/21 0456 08/03/21 0348 08/03/21 0851  NA 139   < > 142 143 147*  K 3.2*   < > 3.8 3.6 3.6  CL 106  --  108 110  --   CO2 21*  --  18* 16*  --   GLUCOSE 135*  --  124* 164*  --   BUN 21  --  20 30*  --   CREATININE 0.89  --  0.90 1.08  --   CALCIUM  8.0*  --  7.9* 7.9*  --   MG 1.8  --  1.8 2.1  --   GFRNONAA >60  --  >60 >60  --   ANIONGAP 12  --  16* 17*  --    < > = values in this interval not displayed.    Lipids  Recent Labs  Lab 08/04/2021 0101  CHOL 66  TRIG 45  HDL 35*  LDLCALC 22  CHOLHDL 1.9    Hematology Recent Labs  Lab 08/03/2021 2003 08/06/2021 2044 08/02/21 0456 08/03/21 0348 08/03/21 0851  WBC 14.9*  --  16.3* 8.5  --   RBC 2.56*  --  3.18* 3.00*  --   HGB 7.4*   < > 9.1* 8.4* 8.5*  HCT 23.3*   < > 28.4* 27.2* 25.0*  MCV 91.0  --  89.3 90.7  --   MCH 28.9  --  28.6 28.0  --   MCHC 31.8  --  32.0 30.9  --   RDW 16.6*  --  16.8* 17.1*  --   PLT 248  --  246 167  --    < > = values in this interval not displayed.   Thyroid No results for input(s): TSH, FREET4 in the last 168 hours.  BNPNo results for input(s): BNP, PROBNP in the last 168 hours.  DDimer No results for input(s): DDIMER in the last 168 hours.   Radiology    DG Chest 1 View  Result Date:  08/03/2021 CLINICAL DATA:  ARDS EXAM: CHEST  1 VIEW COMPARISON:  08/02/2021 FINDINGS: Endotracheal tube seen 4.2 cm above the carina. Nasogastric tube extends into the upper abdomen beyond the margin of the examination. Pulmonary insufflation is stable. Small bilateral pleural effusions are unchanged. Patchy biapical pulmonary infiltrates have progressed slightly since remote prior examination of 08/02/2021, more focal within the right upper lobe, possibly infectious in the acute setting. No pneumothorax. Cardiac size is mildly enlarged, unchanged. Central pulmonary arteries are enlarged in keeping with changes of pulmonary arterial hypertension, unchanged. Thoracolumbar vertebroplasty has been performed. IMPRESSION: Stable support tubes. Stable pulmonary insufflation. Progressive biapical pulmonary infiltrates, possibly infectious in the acute setting. Stable small bilateral pleural effusions. Stable cardiomegaly. Morphologic changes in keeping with pulmonary arterial hypertension. Electronically Signed   By: Fidela Salisbury M.D.   On: 08/03/2021 11:18   DG Chest 1 View  Result Date: 08/02/2021 CLINICAL DATA:  ET tube placement EXAM: CHEST  1 VIEW COMPARISON:  07/23/2021 FINDINGS: The endotracheal tube tip terminates above the carina. There is a nasogastric tube with tip below the GE junction. Stable cardiomediastinal contours. Bilateral pleural effusions and bibasilar atelectasis identified. Pulmonary vascular congestion. IMPRESSION: 1. Stable support apparatus. 2. Persistent bilateral pleural effusions and bibasilar atelectasis. Electronically Signed   By: Kerby Moors M.D.   On: 08/02/2021 11:43   DG Abd 1 View  Result Date: 07/19/2021 CLINICAL DATA:  Status post femoral to femoral bypass revision, check gastric catheter placement EXAM: ABDOMEN - 1 VIEW COMPARISON:  None. FINDINGS: Gastric catheter is noted within the stomach. Proximal side port lies near the gastroesophageal junction. Scattered large  and small bowel gas is noted. Diffuse aortic calcifications are seen. Findings of prior cholecystectomy and vertebral augmentation are noted. IMPRESSION: Gastric catheter within the stomach. No other focal abnormality is noted. Electronically Signed   By: Inez Catalina M.D.   On: 07/16/2021 17:10   DG CHEST PORT 1 VIEW  Result Date: 08/02/2021 CLINICAL DATA:  PICC line placement EXAM:  PORTABLE CHEST 1 VIEW COMPARISON:  Earlier same day FINDINGS: Endotracheal tube tip 5 cm above the carina. Orogastric or nasogastric tube enters the abdomen. New right arm PICC tip in the SVC above the right atrium. Pulmonary venous hypertension persists. Bilateral pleural effusions with volume loss at the lung bases persists. Previous resection clips in the left hilar region. IMPRESSION: Right arm PICC tip in the SVC just above the right atrium. No other change. Electronically Signed   By: Nelson Chimes M.D.   On: 08/02/2021 18:27   DG CHEST PORT 1 VIEW  Result Date: 07/16/2021 CLINICAL DATA:  Status post femoral to femoral bypass revision EXAM: PORTABLE CHEST 1 VIEW COMPARISON:  07/14/2021 FINDINGS: Endotracheal tube is noted in satisfactory position approximately 4.5 cm above the carina. Gastric catheter is noted extending into the stomach. Cardiac shadow remains enlarged. Postsurgical changes are again seen. Aortic calcifications are again noted. Lungs are well aerated bilaterally. Some mild atelectatic changes are seen bilaterally. Small left pleural effusion is noted stable from the prior study. IMPRESSION: Stable bilateral atelectasis. Tubes and lines as described. Small left pleural effusion also stable. Electronically Signed   By: Inez Catalina M.D.   On: 08/06/2021 17:09   [place] EKG SITE RITE  Result Date: 08/02/2021 If Site Rite image not attached, placement could not be confirmed due to current cardiac rhythm.   Cardiac Studies     Patient Profile     80 y.o. adult AFib in the setting of vascular  surgery  Assessment & Plan    AFib: Now off diltiazem but still rate controlled. Off IV heparin due to bleeding.  Pressors currently for blood pressure.  Continue supportive care.  New issue now is fever.  This may also exacerbate his atrial fibrillation.  We will follow.     For questions or updates, please contact [place] Please consult [email or web address] for contact info under        Signed, Larae Grooms, MD  08/03/2021, 11:54 AM

## 2021-08-03 NOTE — Progress Notes (Signed)
Pharmacy Antibiotic Note  Paul [name] is a 80 y.o. adult admitted on 07/13/2021 with PVD s/p bypass. Pt now with fevers and concern for UTI. Pharmacy has been consulted for cefepime dosing. CrCl ok.  Plan: Cefepime 2g IV q12h F/U cultures  Height: 5' 7.5" (171.5 cm) Weight: 102 kg (224 lb 13.9 oz) IBW/kg (Calculated) : 67.25  Temp (24hrs), Avg:100.4 F (38 C), Min:99.5 F (37.5 C), Max:101.1 F (38.4 C)  Recent Labs  Lab 07/31/21 0818 07/31/21 1835 08/06/2021 0106 08/02/2021 0710 08/08/2021 2003 08/02/21 0456 08/03/21 0348 08/03/21 0849  WBC 12.7*  --  14.6* 12.5* 14.9* 16.3* 8.5  --   CREATININE 0.87 0.90  --   --  0.89 0.90 1.08  --   LATICACIDVEN  --   --   --   --   --   --   --  1.1    Estimated Creatinine Clearance (by C-G formula based on SCr of 1.08 mg/dL) Male: 50.6 mL/min Male: 61.6 mL/min    Allergies  Allergen Reactions   Codeine Anaphylaxis   Phenobarbital Hypertension    Arrie Senate, PharmD, BCPS, [facility] Clinical Pharmacist [phone] Please check AMION for all [facility] Pharmacy numbers 08/03/2021

## 2021-08-03 NOTE — Progress Notes (Addendum)
[facility] [place]  Subjective  - intubated   Objective 107/74 70 (!) 100.8 F (38.2 C) (!) 22 100%  Intake/Output Summary (Last 24 hours) at 08/03/2021 0713 Last data filed at 08/03/2021 7185 Gross per 24 hour  Intake 6627.46 ml  Output 2900 ml  Net 3727.46 ml    Lungs intubated with mild sedation Chest  x ray  Groins healing well with dry ABD over B incisions Doppler right DP/PT, left DP   Assessment/Planning: POD # 2 revision of fem-fem bypass 07/18/2021 revision right limb of fem-fem by Dr. Virl Cagey 07/31/2021 Fem-fem bypass by Dr. Carlis Abbott   COPD intubated on Vent. Trial extubation failed Bilateral pleural effusions with volume loss at the lung bases Persists.  Continued Diuresis  Urin Op good > 2900 ml last 24 hr Heart A fib cont. Heparin and Cardizem for rate control Anemia due to surgical blood loss Post transfusion 4 units PRBC And 2 FFP HGB stable 9.1 Heparin restarted 0400 Neo decreased down to 10 mcg/min   Leukocytosis improved now 8.5 Pending extubation  Roxy Horseman 08/03/2021 7:13 AM --  VASCULAR STAFF ADDENDUM: I have independently interviewed and examined the patient. I agree with the above.  Patient with volume overload, atelectasis. Currently being diuresed, extubation pending pulmonary improvement. Wounds dry, excellent signals DP PT bilateral. Appreciate excellent care from ICU and nursing.  Cassandria Santee, MD [facility] Phone Number: [phone] 08/03/2021 7:53 AM     Laboratory Lab Results: Recent Labs    08/02/21 0456 08/03/21 0348  WBC 16.3* 8.5  HGB 9.1* 8.4*  HCT 28.4* 27.2*  PLT 246 167   BMET Recent Labs    08/02/21 0456 08/03/21 0348  NA 142 143  K 3.8 3.6  CL 108 110  CO2 18* 16*  GLUCOSE 124* 164*  BUN 20 30*  CREATININE 0.90 1.08  CALCIUM 7.9* 7.9*    COAG Lab Results  Component Value Date   INR 1.0 07/25/2021   INR 1.0  07/20/2021   INR 0.99 08/09/2016   No results found for: PTT

## 2021-08-04 ENCOUNTER — Inpatient Hospital Stay (HOSPITAL_COMMUNITY): Payer: Medicare Other

## 2021-08-04 DIAGNOSIS — A419 Sepsis, unspecified organism: Secondary | ICD-10-CM | POA: Diagnosis not present

## 2021-08-04 DIAGNOSIS — I4819 Other persistent atrial fibrillation: Secondary | ICD-10-CM

## 2021-08-04 DIAGNOSIS — R6521 Severe sepsis with septic shock: Secondary | ICD-10-CM | POA: Diagnosis not present

## 2021-08-04 DIAGNOSIS — R9431 Abnormal electrocardiogram [ECG] [EKG]: Secondary | ICD-10-CM | POA: Diagnosis not present

## 2021-08-04 DIAGNOSIS — E440 Moderate protein-calorie malnutrition: Secondary | ICD-10-CM | POA: Insufficient documentation

## 2021-08-04 DIAGNOSIS — J9601 Acute respiratory failure with hypoxia: Secondary | ICD-10-CM | POA: Diagnosis not present

## 2021-08-04 DIAGNOSIS — I739 Peripheral vascular disease, unspecified: Secondary | ICD-10-CM | POA: Diagnosis not present

## 2021-08-04 LAB — BASIC METABOLIC PANEL
Anion gap: 11 (ref 5–15)
Anion gap: 9 (ref 5–15)
Anion gap: 8 (ref 5–15)
BUN: 39 mg/dL — ABNORMAL HIGH (ref 8–23)
BUN: 36 mg/dL — ABNORMAL HIGH (ref 8–23)
BUN: 43 mg/dL — ABNORMAL HIGH (ref 8–23)
CO2: 26 mmol/L (ref 22–32)
CO2: 31 mmol/L (ref 22–32)
CO2: 25 mmol/L (ref 22–32)
Calcium: 7.5 mg/dL — ABNORMAL LOW (ref 8.9–10.3)
Calcium: 6.9 mg/dL — ABNORMAL LOW (ref 8.9–10.3)
Calcium: 7.1 mg/dL — ABNORMAL LOW (ref 8.9–10.3)
Chloride: 112 mmol/L — ABNORMAL HIGH (ref 98–111)
Chloride: 108 mmol/L (ref 98–111)
Chloride: 116 mmol/L — ABNORMAL HIGH (ref 98–111)
Creatinine, Ser: 0.92 mg/dL (ref 0.61–1.24)
Creatinine, Ser: 0.89 mg/dL (ref 0.61–1.24)
Creatinine, Ser: 0.82 mg/dL (ref 0.61–1.24)
GFR, Estimated: >60 mL/min (ref >60)
GFR, Estimated: >60 mL/min (ref >60)
GFR, Estimated: >60 mL/min (ref >60)
Glucose, Bld: 340 mg/dL — ABNORMAL HIGH (ref 70–99)
Glucose, Bld: 360 mg/dL — ABNORMAL HIGH (ref 70–99)
Glucose, Bld: 417 mg/dL — ABNORMAL HIGH (ref 70–99)
Potassium: 3.3 mmol/L — ABNORMAL LOW (ref 3.5–5.1)
Potassium: 3 mmol/L — ABNORMAL LOW (ref 3.5–5.1)
Potassium: 3.3 mmol/L — ABNORMAL LOW (ref 3.5–5.1)
Sodium: 149 mmol/L — ABNORMAL HIGH (ref 135–145)
Sodium: 148 mmol/L — ABNORMAL HIGH (ref 135–145)
Sodium: 149 mmol/L — ABNORMAL HIGH (ref 135–145)

## 2021-08-04 LAB — CBC
HCT: 29.2 % — ABNORMAL LOW (ref 39.0–52.0)
HCT: 28.6 % — ABNORMAL LOW (ref 39.0–52.0)
Hemoglobin: 9.2 g/dL — ABNORMAL LOW (ref 13.0–17.0)
Hemoglobin: 8.8 g/dL — ABNORMAL LOW (ref 13.0–17.0)
MCH: 28 pg (ref 26.0–34.0)
MCH: 27.7 pg (ref 26.0–34.0)
MCHC: 31.5 g/dL (ref 30.0–36.0)
MCHC: 30.8 g/dL (ref 30.0–36.0)
MCV: 88.8 fL (ref 80.0–100.0)
MCV: 89.9 fL (ref 80.0–100.0)
Platelets: 183 10*3/uL (ref 150–400)
Platelets: 127 10*3/uL — ABNORMAL LOW (ref 150–400)
RBC: 3.29 MIL/uL — ABNORMAL LOW (ref 4.22–5.81)
RBC: 3.18 MIL/uL — ABNORMAL LOW (ref 4.22–5.81)
RDW: 17.1 % — ABNORMAL HIGH (ref 11.5–15.5)
RDW: 17.1 % — ABNORMAL HIGH (ref 11.5–15.5)
WBC: 12.4 10*3/uL — ABNORMAL HIGH (ref 4.0–10.5)
WBC: 15.8 10*3/uL — ABNORMAL HIGH (ref 4.0–10.5)
nRBC: 1.5 % — ABNORMAL HIGH (ref 0.0–0.2)
nRBC: 0.9 % — ABNORMAL HIGH (ref 0.0–0.2)

## 2021-08-04 LAB — HEPATIC FUNCTION PANEL
ALT: 17 U/L (ref 0–44)
AST: 22 U/L (ref 15–41)
Albumin: 2.1 g/dL — ABNORMAL LOW (ref 3.5–5.0)
Alkaline Phosphatase: 66 U/L (ref 38–126)
Bilirubin, Direct: 0.5 mg/dL — ABNORMAL HIGH (ref 0.0–0.2)
Indirect Bilirubin: 0.8 mg/dL (ref 0.3–0.9)
Total Bilirubin: 1.3 mg/dL — ABNORMAL HIGH (ref 0.3–1.2)
Total Protein: 4.4 g/dL — ABNORMAL LOW (ref 6.5–8.1)

## 2021-08-04 LAB — GLUCOSE, CAPILLARY
Glucose-Capillary: 159 mg/dL — ABNORMAL HIGH (ref 70–99)
Glucose-Capillary: 193 mg/dL — ABNORMAL HIGH (ref 70–99)
Glucose-Capillary: 236 mg/dL — ABNORMAL HIGH (ref 70–99)
Glucose-Capillary: 267 mg/dL — ABNORMAL HIGH (ref 70–99)
Glucose-Capillary: 279 mg/dL — ABNORMAL HIGH (ref 70–99)
Glucose-Capillary: 90 mg/dL (ref 70–99)

## 2021-08-04 LAB — URINALYSIS, ROUTINE W REFLEX MICROSCOPIC
Bacteria, UA: NONE SEEN
Bilirubin Urine: NEGATIVE
Glucose, UA: >=500 mg/dL — AB
Ketones, ur: NEGATIVE mg/dL
Nitrite: NEGATIVE
Protein, ur: NEGATIVE mg/dL
Specific Gravity, Urine: 1.01 (ref 1.005–1.030)
pH: 5 (ref 5.0–8.0)

## 2021-08-04 LAB — POCT I-STAT 7, (LYTES, BLD GAS, ICA,H+H)
Acid-Base Excess: 4 mmol/L — ABNORMAL HIGH (ref 0.0–2.0)
Bicarbonate: 27.9 mmol/L (ref 20.0–28.0)
Calcium, Ion: 1.09 mmol/L — ABNORMAL LOW (ref 1.15–1.40)
HCT: 26 % — ABNORMAL LOW (ref 39.0–52.0)
Hemoglobin: 8.8 g/dL — ABNORMAL LOW (ref 13.0–17.0)
O2 Saturation: 93 %
Patient temperature: 38.5
Potassium: 3.2 mmol/L — ABNORMAL LOW (ref 3.5–5.1)
Sodium: 152 mmol/L — ABNORMAL HIGH (ref 135–145)
TCO2: 29 mmol/L (ref 22–32)
pCO2 arterial: 41.3 mmHg (ref 32.0–48.0)
pH, Arterial: 7.444 (ref 7.350–7.450)
pO2, Arterial: 71 mmHg — ABNORMAL LOW (ref 83.0–108.0)

## 2021-08-04 LAB — PHOSPHORUS: Phosphorus: 2.6 mg/dL (ref 2.5–4.6)

## 2021-08-04 LAB — MAGNESIUM: Magnesium: 2.1 mg/dL (ref 1.7–2.4)

## 2021-08-04 LAB — HEPARIN LEVEL (UNFRACTIONATED): Heparin Unfractionated: 0.28 IU/mL — ABNORMAL LOW (ref 0.30–0.70)

## 2021-08-04 MED ORDER — SODIUM CHLORIDE 0.9 % IV SOLN
2.0000 g | Freq: Three times a day (TID) | INTRAVENOUS | Status: DC
  Administered 2021-08-04 – 2021-08-07 (×9): 2 g via INTRAVENOUS
  Filled 2021-08-04 (×10): qty 2

## 2021-08-04 MED ORDER — FUROSEMIDE 10 MG/ML IJ SOLN
40.0000 mg | Freq: Once | INTRAMUSCULAR | Status: AC
  Administered 2021-08-04: 40 mg via INTRAVENOUS
  Filled 2021-08-04: qty 4

## 2021-08-04 MED ORDER — INSULIN ASPART 100 UNIT/ML IJ SOLN
0.0000 [IU] | INTRAMUSCULAR | Status: DC
  Administered 2021-08-04: 4 [IU] via SUBCUTANEOUS
  Administered 2021-08-04: 11 [IU] via SUBCUTANEOUS
  Administered 2021-08-04: 7 [IU] via SUBCUTANEOUS
  Administered 2021-08-05 (×6): 11 [IU] via SUBCUTANEOUS
  Administered 2021-08-05: 15 [IU] via SUBCUTANEOUS
  Administered 2021-08-06: 20 [IU] via SUBCUTANEOUS
  Administered 2021-08-06: 15 [IU] via SUBCUTANEOUS
  Administered 2021-08-06: 20 [IU] via SUBCUTANEOUS
  Administered 2021-08-06: 15 [IU] via SUBCUTANEOUS
  Administered 2021-08-06: 20 [IU] via SUBCUTANEOUS
  Administered 2021-08-07: 15 [IU] via SUBCUTANEOUS
  Administered 2021-08-07 (×2): 11 [IU] via SUBCUTANEOUS
  Administered 2021-08-07: 15 [IU] via SUBCUTANEOUS

## 2021-08-04 MED ORDER — EPINEPHRINE 1 MG/10ML IJ SOSY
PREFILLED_SYRINGE | INTRAMUSCULAR | Status: AC
  Filled 2021-08-04: qty 10

## 2021-08-04 MED ORDER — MAGNESIUM SULFATE 2 GM/50ML IV SOLN
2.0000 g | Freq: Once | INTRAVENOUS | Status: AC
  Administered 2021-08-04: 2 g via INTRAVENOUS
  Filled 2021-08-04: qty 50

## 2021-08-04 MED ORDER — ATORVASTATIN CALCIUM 80 MG PO TABS
80.0000 mg | ORAL_TABLET | Freq: Every day | ORAL | Status: DC
  Administered 2021-08-04 – 2021-08-07 (×4): 80 mg
  Filled 2021-08-04 (×4): qty 1

## 2021-08-04 MED ORDER — FREE WATER
200.0000 mL | Status: DC
  Administered 2021-08-04 – 2021-08-05 (×7): 200 mL

## 2021-08-04 MED ORDER — HEPARIN (PORCINE) 25000 UT/250ML-% IV SOLN
1500.0000 [IU]/h | INTRAVENOUS | Status: DC
  Administered 2021-08-04: 1400 [IU]/h via INTRAVENOUS
  Administered 2021-08-04: 1500 [IU]/h via INTRAVENOUS
  Filled 2021-08-04 (×2): qty 250

## 2021-08-04 MED ORDER — POTASSIUM CHLORIDE 20 MEQ PO PACK
40.0000 meq | PACK | Freq: Two times a day (BID) | ORAL | Status: DC
Start: 2021-08-04 — End: 2021-08-06
  Administered 2021-08-04 – 2021-08-06 (×4): 40 meq
  Filled 2021-08-04 (×5): qty 2

## 2021-08-04 MED ORDER — SODIUM CHLORIDE 0.9 % IV SOLN: INTRAVENOUS | Status: DC | PRN

## 2021-08-04 MED ORDER — POTASSIUM CHLORIDE 10 MEQ/100ML IV SOLN
10.0000 meq | INTRAVENOUS | Status: AC
Start: 2021-08-04 — End: 2021-08-04
  Administered 2021-08-04 (×6): 10 meq via INTRAVENOUS
  Filled 2021-08-04 (×5): qty 100

## 2021-08-04 NOTE — Progress Notes (Signed)
Pt temp 38.8 despite prn tylenol. Cooling blanket ordered. Ice packs placed until cooling blanket arrives. Monitoring closely. NP made aware.   Lucius Conn, RN

## 2021-08-04 NOTE — Progress Notes (Signed)
Patient was transported to and from CT w/o complications. Uneventful trip.   Leanndra Pember L. Tamala Julian, BS, RRT-ACCS, RCP

## 2021-08-04 NOTE — Progress Notes (Signed)
NAME:  Paul [name], MRN:  [id number], DOB:  May 17, 1941, LOS: 7 ADMISSION DATE:  08/09/2021, CONSULTATION DATE:  08/11/2021 REFERRING MD:  Dr. Scot Dock, CHIEF COMPLAINT:     History of Present Illness:  HPI obtained from medical chart review as patient remains sedated and intubated on mechanical ventilation.   80 year old male with medical history of tobacco abuse, COPD, prior lung cancer s/p wedge resection LLL 2007, PE (2013), PAD, CAD, afib on Eliquis, DM, HTN, and HLD admitted to vascular surgery on 9/16 for right to left femorofemoral bypass after left great toe wound x 3 months with unsuccessful retrograde and antegrade attempts from brachial access to cross left common iliac occlusion by Dr. Fletcher Anon on 07/19/21.  Patient is followed in our office by Dr. Melvyn Novas.  He continues to smoke.     Patient underwent right to left femoral artery bypass on 9/16.  Course complicated by postop bleeding and hematoma requiring return to the OR on 9/17 for bilateral groin exploration, right iliofemoral embolectomy, left iliofemoral embolectomy, and femoral-femoral bypass revision.  Patient was seen by Cardiology on 9/19 for atrial fibrillation with rapid ventricular rate in which Cardizem was added and maintained on heparin gtt per pharmacy.  Also noted during this time patient had some shortness of breath with CXR showing lung hyperexpansion and chronic bronchitic changes and minimal bibasilar atelectasis without acute process.  He was given lasix with reported improvement.  However, he was found on repeat imaging to have re-occluded bypass prompting return third return to OR on 9/20 for redo of right to left femorofemoral bypass.  He returns the ICU on mechanical ventilation, PCCM consulted for vent management.    Pertinent  Medical History  Ongoing tobacco abuse, COPD, GERD, DM, CAD s/p PCI 1988, Afib on Eliquis, PAD, HTN, HLD, PE 2013, Stage IA (T1, N0, MX) non-small cell lung cancer, adenocarcinoma s/p wedge  resection of LLL and seed implant 01/2006  Significant Hospital Events: Including procedures, antibiotic start and stop dates in addition to other pertinent events   9/16 admitted for right to left femoral artery bypass on 9/16 9/17 post op hematoma/ bleeding, back to OR for s/p bilateral groin exploration, right iliofemoral embolectomy, left iliofemoral embolectomy, femoral-femoral bypass revision 9/19 cards consulted for Afib with RVR-> started on cardizem.  SOB s/p lasix 9/20 CT showing re-occlusion -> back to OR for redo bypass, returns to ICU on mechanical ventilation; PCCM consulted  Interim History / Subjective:  Lost art line overnight Poorly palpable pulses On low dose neo   Objective   Blood pressure 113/62, pulse (!) 106, temperature (!) 102 F (38.9 C), resp. rate (!) 31, height 5' 7.5" (1.715 m), weight 99.1 kg, SpO2 96 %.    Vent Mode: PRVC FiO2 (%):  [40 %-60 %] 40 % Set Rate:  [28 bmp] 28 bmp Vt Set:  [530 mL] 530 mL PEEP:  [10 cmH20] 10 cmH20 Plateau Pressure:  [18 cmH20-21 cmH20] 20 cmH20   Intake/Output Summary (Last 24 hours) at 08/04/2021 1126 Last data filed at 08/04/2021 1000 Gross per 24 hour  Intake 6256.83 ml  Output 4015 ml  Net 2241.83 ml   Filed Weights   08/02/21 0454 08/03/21 0353 08/04/21 0417  Weight: 102.5 kg 102 kg 99.1 kg    Examination: Gen- chronically and critically ill appearing older adult M intubated sedated NAD  Neuro: sedated pinpoint pupils. Does not follow commands  HEENT: NCAT ETT secure anicteric sclera  Pulm: Symmetrical chest expansion. Mechanically ventilated.  Diminished basilar sounds  CV: rr s1s2 no rgm  GI- pannus. Soft ndnt.  GU: edematous penis and scrotum. Skin tear at meatus. Foley  Ext: LUE cyanosis.  Skin- pale, scattered ecchymosis over abdomen. Groin hematomas.   Resolved Hospital Problem list    Assessment & Plan:    Acute metabolic encephalopathy  -CNS depressing meds, acidosis, delirium  P -RASS goal  0 -wean sedation as able   Acute respiratory failure with hypoxia  Hx COPD. P - Cont efforts at vent wean -mentation very limiting at this point, vent settings are actually decreased  -Bds   LLE limb ischemia, occluded L iliac artery - s/p R to L femoral artery bypass 9/16.  Course complicated by postop bleeding prompting return OR trip 9/17 with revision  including right and left iliofemoral embolectomy.  Course further complicated by occluded bypass prompting second OR trip 9/20 for redo of R to L femoral - femoral bypass. Post-op hematoma P  -per VVS  Shock, mild -- sepsis?  -has had worsening fever but not a robust WBC or LA.  -echo assuring against cardiogenic component. Did have some bleeding but doubt hypovolemia.  P -abx  -neo per VVS, wean as able  -wean sedation  -CT a/p  -check a PCT    Hx Afib - resuming hep gtt (was held due to bleeding graft sites)  - Transition to Eliquis when cleared by vascular and cards. -K goal 4 mag goal 2   Hypokalemia P -replace K, will also give mag   Metabolic acidosis, improving -started on bicarb gtt, overbreathing vent -not sure what exactly is driving this, concerned for possible ischemic process  P -reduce bicarb gg -recheck aBG -Ct a/p as above    Hx HTN, HLD. - hold antihypertensives in setting of shock    Anemia, acute on chronic  - Stable, trend CBC    DM2  - rSSI  Right basilar lung nodule  - 2.4 cm  RLL nodule, almost appears spiculated, seen on CT 07/21/2021. Previous hx of tage IA (T1, N0, MX) non-small cell lung cancer, adenocarcinoma s/p wedge resection and seed implants of LLL 01/2006.  Appears to last been seen by oncology in 2015.  Can f/u with oncology or follow up in our office with either Dr. Valeta Harms or Dr. Lamonte Sakai for further diagnostic evaluation.   Goals of Care -Chronically and critically ill. I imagine that at best this will be a very challenging recovery course, and certainly pt is at risk for further  decompensation and deterioration. Would likely benefit from early palliative consult for [place] discussions.    Best Practice (right click and "Reselect all SmartList Selections" daily)   Diet/type: NPO DVT prophylaxis: systemic heparin GI prophylaxis: PPI Lines: N/A Foley:  Yes, and it is still needed Code Status:  full code Last date of multidisciplinary goals of care discussion [per primary]  CRITICAL CARE Performed by: Cristal Generous   Total critical care time: 51 minutes  Critical care time was exclusive of separately billable procedures and treating other patients.  Critical care was necessary to treat or prevent imminent or life-threatening deterioration.  Critical care was time spent personally by me on the following activities: development of treatment plan with patient and/or surrogate as well as nursing, discussions with consultants, evaluation of patient's response to treatment, examination of patient, obtaining history from patient or surrogate, ordering and performing treatments and interventions, ordering and review of laboratory studies, ordering and review of radiographic studies, pulse oximetry and  re-evaluation of patient's condition.  Eliseo Gum MSN, AGACNP-BC [place] for pager  08/04/2021, 11:26 AM

## 2021-08-04 NOTE — Progress Notes (Signed)
ANTICOAGULATION CONSULT NOTE   Pharmacy Consult for heparin Indication: bypass patency / AFib  Allergies  Allergen Reactions   Codeine Anaphylaxis   Phenobarbital Hypertension    Patient Measurements: Height: 5' 7.5" (171.5 cm) Weight: 99.1 kg (218 lb 7.6 oz) IBW/kg (Calculated) : 67.25 HEPARIN DW (KG): 87.6   Vital Signs: Temp: 98.2 F (36.8 C) (09/23 1611) BP: 91/56 (09/23 1745) Pulse Rate: 87 (09/23 1745)  Labs: Recent Labs    08/02/21 0456 08/02/21 0645 08/03/21 0348 08/03/21 0851 08/03/21 1928 08/03/21 2224 08/04/21 0422 08/04/21 0842 08/04/21 1016 08/04/21 1550  HGB 9.1*  --  8.4*   < > 8.2*  --  9.2*  --  8.8*  --   HCT 28.4*  --  27.2*   < > 24.0*  --  29.2*  --  26.0*  --   PLT 246  --  167  --   --   --  183  --   --   --   HEPARINUNFRC  --  0.32 0.44  --   --   --   --   --   --  0.28*  CREATININE 0.90  --  1.08  --   --  0.98 0.92 0.89  --   --    < > = values in this interval not displayed.     Estimated Creatinine Clearance (by C-G formula based on SCr of 0.89 mg/dL) Male: 60.5 mL/min Male: 73.7 mL/min  Assessment: 71 male s/p right to left femoral-femoral bypass on 9/16. Pharmacy consulted to dose heparin for bypass patency. He is noted on apixaban PTA (last dose taken 07/17/21).   Pt s/p re-do of bypass 9/20, heparin resumed postop. Pt with groin bleeding 9/22 so heparin held, CBC stable today, heparin to resume per VVS. -heparin level= 0.28 on 1400 units/hr  Goal of Therapy:  Heparin level 0.3-0.5 units/ml Monitor platelets by anticoagulation protocol: Yes   Plan:  -Increase heparin to 1500 units/hr -Heparin level and CBC in am  Hildred Laser, PharmD Clinical Pharmacist **Pharmacist phone directory can now be found on [email or web address] ([email or web address]).  Listed under [place].

## 2021-08-04 NOTE — Procedures (Signed)
Arterial Catheter Insertion Procedure Note  PINCHAS REITHER  [id number]  1941/04/24  Date:08/04/21  Time:9:35 AM    Provider Performing: Kathie Dike    Procedure: Insertion of Arterial Line [phone]) without US guidance  Indication(s) Blood pressure monitoring and/or need for frequent ABGs  Consent Unable to obtain consent due to emergent nature of procedure. Pt sedated on the ventilator, no family present. Just replacing previous A line per MD order  Anesthesia None   Time Out Verified patient identification, verified procedure, site/side was marked, verified correct patient position, special equipment/implants available, medications/allergies/relevant history reviewed, required imaging and test results available.   Sterile Technique Maximal sterile technique including full sterile barrier drape, hand hygiene, sterile gown, sterile gloves, mask, hair covering, sterile ultrasound probe cover (if used).   Procedure Description Area of catheter insertion was cleaned with chlorhexidine and draped in sterile fashion. Without real-time ultrasound guidance an arterial catheter was placed into the right radial artery.  Appropriate arterial tracings confirmed on monitor.     Complications/Tolerance None; patient tolerated the procedure well.   EBL Minimal   Specimen(s) None

## 2021-08-04 NOTE — Progress Notes (Signed)
[place] for heparin Indication: bypass patency / AFib  Allergies  Allergen Reactions   Codeine Anaphylaxis   Phenobarbital Hypertension    Patient Measurements: Height: 5' 7.5" (171.5 cm) Weight: 99.1 kg (218 lb 7.6 oz) IBW/kg (Calculated) : 67.25 HEPARIN DW (KG): 87.6   Vital Signs: Temp: 100.8 F (38.2 C) (09/23 0700) Temp Source: Esophageal (09/23 0400) BP: 114/67 (09/23 0700) Pulse Rate: 79 (09/23 0700)  Labs: Recent Labs    08/02/21 0456 08/02/21 0645 08/03/21 0348 08/03/21 0851 08/03/21 1215 08/03/21 1928 08/03/21 2224 08/04/21 0422  HGB 9.1*  --  8.4*   < > 8.5* 8.2*  --  9.2*  HCT 28.4*  --  27.2*   < > 25.0* 24.0*  --  29.2*  PLT 246  --  167  --   --   --   --  183  HEPARINUNFRC  --  0.32 0.44  --   --   --   --   --   CREATININE 0.90  --  1.08  --   --   --  0.98 0.92   < > = values in this interval not displayed.     Estimated Creatinine Clearance (by C-G formula based on SCr of 0.92 mg/dL) Male: 58.5 mL/min Male: 71.3 mL/min  Assessment: 62 male s/p right to left femoral-femoral bypass on 9/16. Pharmacy consulted to dose heparin for bypass patency. He is noted on apixaban PTA (last dose taken 07/17/21).   Pt s/p re-do of bypass 9/20, heparin resumed postop. Pt with groin bleeding 9/22 so heparin held, CBC stable today, heparin to resume per VVS.  Goal of Therapy:  Heparin level 0.3-0.5 units/ml Monitor platelets by anticoagulation protocol: Yes   Plan:  Restart heparin 1400 units/h Recheck heparin level in 8h    Arrie Senate, PharmD, [place], [facility] Clinical Pharmacist [phone] Please check AMION for all [facility] numbers 08/04/2021

## 2021-08-04 NOTE — Progress Notes (Signed)
Pharmacy Antibiotic Note  Paul [name] is a 80 y.o. adult admitted on 07/26/2021 with PVD s/p bypass. Pt now with fevers and unclear source. Pharmacy has been consulted for cefepime dosing. CrCl ok. Will increase cefepime dose to cover for non-UTI infections.  Plan: Cefepime 2g IV q8h F/U cultures  Height: 5' 7.5" (171.5 cm) Weight: 99.1 kg (218 lb 7.6 oz) IBW/kg (Calculated) : 67.25  Temp (24hrs), Avg:100.2 F (37.9 C), Min:98.8 F (37.1 C), Max:102.6 F (39.2 C)  Recent Labs  Lab 07/25/2021 0710 08/10/2021 2003 08/02/21 0456 08/03/21 0348 08/03/21 0849 08/03/21 1213 08/03/21 2224 08/04/21 0422 08/04/21 0842  WBC 12.5* 14.9* 16.3* 8.5  --   --   --  12.4*  --   CREATININE  --  0.89 0.90 1.08  --   --  0.98 0.92 0.89  LATICACIDVEN  --   --   --   --  1.1 1.1  --   --   --      Estimated Creatinine Clearance (by C-G formula based on SCr of 0.89 mg/dL) Male: 60.5 mL/min Male: 73.7 mL/min    Allergies  Allergen Reactions   Codeine Anaphylaxis   Phenobarbital Hypertension    Arrie Senate, PharmD, BCPS, [facility] Clinical Pharmacist [phone] Please check AMION for all [facility] Pharmacy numbers 08/04/2021

## 2021-08-04 NOTE — Progress Notes (Signed)
Per night shift report, pt HR started to go to the 30's. Lowest HR 27 with several pauses. Night shift RN reportedly weaned Dex to 0.7. BMP showed K 2.9, replaced K PHOS. Pads placed on patient.  0422 labs drawn mid kphos replacement showed k 3.3. NP notified. Monitoring closely. HR currently 85, afib. BP 115/58 on 50 of Neo. Monitoring closely. VSS.   Lucius Conn, RN

## 2021-08-04 NOTE — Progress Notes (Addendum)
[facility] [place]  Subjective  - Intubated   Objective (!) 141/74 84 (!) 100.8 F (38.2 C) (!) 28 99%  Intake/Output Summary (Last 24 hours) at 08/04/2021 0701 Last data filed at 08/04/2021 8588 Gross per 24 hour  Intake 5744.42 ml  Output 3965 ml  Net 1779.42 ml    Left hand dusky with distal radial and ulnar signals above A line, but no signals below level of A line. Doppler right PT, left DP signals Attempted to wean off pressure again which failed with brady cardia now on Neo 55 mcg/min Groin with ecchymosis and dependent scrotal edema. Incisional vacs in place.  No active drainage Groins without frank hematoma     Assessment/Planning: POD # 3 revision of fem-fem bypass 07/18/2021 revision right limb of fem-fem by Dr. Virl Cagey 07/21/2021 Fem-fem bypass by Dr. Alfonso Ramus cardia hypotension requiring pressor support 55 mcg/min Neo. Intubated Left hand dusky without distal ulnar or palmer signals.  Will d/c A line from left hand and find new site.Heparin was held yesterday am at 9:55 am will restart. HGB stable 9.2 this am.  No sign of blood loss anemia in 24 hours Inflow to B LE intact with doppler signals. Hypokalemia replaced Urine OP > 3,000   Roxy Horseman 08/04/2021 7:01 AM  VASCULAR STAFF ADDENDUM: I have independently interviewed and examined the patient. I agree with the above.  Ulnar signal to wrist, however nothing in palmer arch. Radial signal proximal to aline placement Signals in the feet bilaterally and prevena's placed on groins due to hematoma lysing and seeping through his incision sites. H&H stable  Pt with multiple medical problems - Acute respiratory failure volume overload, question of infectious etiology. Fortunately making great urine - Other infectious source likely urine - New onset left hand ischemia - Aline pulled, needs to have hand warmed with q1h signal checks. Weaning pressor as tolerated with help -  Bilateral groins with hematoma lysis. Major long term concern is poor wound healing and infection, especially in the setting of multiple redo operations.  - Episodes of bradycardia overnight - unsure of etiology.   The pts wife was updated the morning regarding all of the above.   I appreciate the excellent care that he is receiving from the ICU and staff.    Cassandria Santee, MD [facility] Phone Number: [phone] 08/04/2021 8:56 AM   --  Laboratory Lab Results: Recent Labs    08/03/21 0348 08/03/21 0851 08/03/21 1928 08/04/21 0422  WBC 8.5  --   --  12.4*  HGB 8.4*   < > 8.2* 9.2*  HCT 27.2*   < > 24.0* 29.2*  PLT 167  --   --  183   < > = values in this interval not displayed.   BMET Recent Labs    08/03/21 2224 08/04/21 0422  NA 149* 149*  K 2.9* 3.3*  CL 115* 112*  CO2 25 26  GLUCOSE 231* 340*  BUN 38* 39*  CREATININE 0.98 0.92  CALCIUM 7.7* 7.5*    COAG Lab Results  Component Value Date   INR 1.0 07/25/2021   INR 1.0 07/20/2021   INR 0.99 08/09/2016   No results found for: PTT

## 2021-08-04 NOTE — Progress Notes (Signed)
Progress Note  Patient Name: Paul [name] Date of Encounter: 08/04/2021  [facility] Cardiologist: Kirk Ruths, MD   Subjective   Intubated, sedated. Pressors being weaned  Inpatient Medications    Scheduled Meds:  sodium chloride   Intravenous Once   sodium chloride   Intravenous Once   sodium chloride   Intravenous Once   sodium chloride   Intravenous Once   atorvastatin  80 mg Per Tube Daily   chlorhexidine gluconate (MEDLINE KIT)  15 mL Mouth Rinse BID   Chlorhexidine Gluconate Cloth  6 each Topical Daily   dextrose  1 ampule Intravenous Once   docusate  100 mg Per Tube Daily   feeding supplement (PROSource TF)  45 mL Per Tube TID   free water  200 mL Per Tube Q4H   gabapentin  600 mg Per Tube QHS   insulin aspart  0-20 Units Subcutaneous Q4H   mouth rinse  15 mL Mouth Rinse 10 times per day   pantoprazole sodium  40 mg Per Tube Daily   sodium chloride flush  10-40 mL Intracatheter Q12H   Continuous Infusions:  sodium chloride     sodium chloride Stopped (08/04/21 1012)   sodium chloride     sodium chloride     ceFEPime (MAXIPIME) IV     dexmedetomidine (PRECEDEX) IV infusion 0.3 mcg/kg/hr (08/04/21 1100)   feeding supplement (VITAL 1.5 CAL) 1,000 mL (08/04/21 0924)   fentaNYL infusion INTRAVENOUS 75 mcg/hr (08/04/21 1100)   heparin 1,400 Units/hr (08/04/21 1100)   magnesium sulfate bolus IVPB     phenylephrine (NEO-SYNEPHRINE) Adult infusion Stopped (08/04/21 1022)   potassium chloride 10 mEq (08/04/21 1121)    sodium bicarbonate (isotonic) infusion in sterile water 50 mL/hr at 08/04/21 1100   PRN Meds: sodium chloride, Place/Maintain arterial line **AND** sodium chloride, acetaminophen (TYLENOL) oral liquid 160 mg/5 mL, bisacodyl, fentaNYL, guaiFENesin-dextromethorphan, magnesium sulfate bolus IVPB, morphine injection, sodium chloride flush   Vital Signs    Vitals:   08/04/21 1115 08/04/21 1130 08/04/21 1145 08/04/21 1200  BP: 123/67 (!) 112/56  112/64 109/62  Pulse: (!) 107 98 (!) 101 98  Resp: (!) 30 (!) 28 (!) 29 (!) 29  Temp: (!) 101.8 F (38.8 C) (!) 101.1 F (38.4 C) (!) 101.8 F (38.8 C) (!) 101.8 F (38.8 C)  TempSrc:      SpO2: 98% 97% 98% 97%  Weight:      Height:        Intake/Output Summary (Last 24 hours) at 08/04/2021 1217 Last data filed at 08/04/2021 1100 Gross per 24 hour  Intake 6360.19 ml  Output 4165 ml  Net 2195.19 ml   Last 3 Weights 08/04/2021 08/03/2021 08/02/2021  Weight (lbs) 218 lb 7.6 oz 224 lb 13.9 oz 225 lb 15.5 oz  Weight (kg) 99.1 kg 102 kg 102.5 kg      Telemetry    AFib - Personally Reviewed  ECG    AFib, RBBB - Personally Reviewed  Physical Exam   GEN: No acute distress.  Intubated, sedated Neck: No JVD Cardiac: RRR, no murmurs, rubs, or gallops.  Respiratory: Clear to auscultation bilaterally. GI: Soft, nontender, non-distended , obese; rednesss at lower abdomen MS: No edema; No deformity. Feet in boots Neuro:  Nonfocal  Psych: Normal affect , intubated sedated  Labs    High Sensitivity Troponin:  No results for input(s): TROPONINIHS in the last 720 hours.   Chemistry Recent Labs  Lab 08/03/21 [id number] 08/03/21 0851 08/03/21 2224 08/04/21  0422 08/04/21 0842 08/04/21 1016  NA 143   < > 149* 149* 148* 152*  K 3.6   < > 2.9* 3.3* 3.0* 3.2*  CL 110  --  115* 112* 108  --   CO2 16*  --  _0 --   GLUCOSE 164*  --  231* 340* 360*  --   BUN 30*  --  38* 39* 36*  --   CREATININE 1.08  --  0.98 0.92 0.89  --   CALCIUM 7.9*  --  7.7* 7.5* 6.9*  --   MG 2.1  --  2.1 2.1  --   --   PROT  --   --   --   --  4.4*  --   ALBUMIN  --   --   --   --  2.1*  --   AST  --   --   --   --  22  --   ALT  --   --   --   --  17  --   ALKPHOS  --   --   --   --  66  --   BILITOT  --   --   --   --  1.3*  --   GFRNONAA >60  --  >60 >60 >60  --   ANIONGAP 17*  --  _1 --    < > = values in this interval not displayed.    Lipids  Recent Labs  Lab 07/19/2021 0101  CHOL 66   TRIG 45  HDL 35*  LDLCALC 22  CHOLHDL 1.9    Hematology Recent Labs  Lab 08/02/21 0456 08/03/21 0348 08/03/21 0851 08/03/21 1928 08/04/21 0422 08/04/21 1016  WBC 16.3* 8.5  --   --  12.4*  --   RBC 3.18* 3.00*  --   --  3.29*  --   HGB 9.1* 8.4*   < > 8.2* 9.2* 8.8*  HCT 28.4* 27.2*   < > 24.0* 29.2* 26.0*  MCV 89.3 90.7  --   --  88.8  --   MCH 28.6 28.0  --   --  28.0  --   MCHC 32.0 30.9  --   --  31.5  --   RDW 16.8* 17.1*  --   --  17.1*  --   PLT 246 167  --   --  183  --    < > = values in this interval not displayed.   Thyroid No results for input(s): TSH, FREET4 in the last 168 hours.  BNPNo results for input(s): BNP, PROBNP in the last 168 hours.  DDimer No results for input(s): DDIMER in the last 168 hours.   Radiology    DG Chest 1 View  Result Date: 08/03/2021 CLINICAL DATA:  ARDS EXAM: CHEST  1 VIEW COMPARISON:  08/02/2021 FINDINGS: Endotracheal tube seen 4.2 cm above the carina. Nasogastric tube extends into the upper abdomen beyond the margin of the examination. Pulmonary insufflation is stable. Small bilateral pleural effusions are unchanged. Patchy biapical pulmonary infiltrates have progressed slightly since remote prior examination of 08/02/2021, more focal within the right upper lobe, possibly infectious in the acute setting. No pneumothorax. Cardiac size is mildly enlarged, unchanged. Central pulmonary arteries are enlarged in keeping with changes of pulmonary arterial hypertension, unchanged. Thoracolumbar vertebroplasty has been performed. IMPRESSION: Stable support tubes. Stable pulmonary insufflation. Progressive biapical pulmonary infiltrates, possibly infectious in the acute setting.  Stable small bilateral pleural effusions. Stable cardiomegaly. Morphologic changes in keeping with pulmonary arterial hypertension. Electronically Signed   By: Fidela Salisbury M.D.   On: 08/03/2021 11:18   DG CHEST PORT 1 VIEW  Result Date: 08/02/2021 CLINICAL DATA:  PICC  line placement EXAM: PORTABLE CHEST 1 VIEW COMPARISON:  Earlier same day FINDINGS: Endotracheal tube tip 5 cm above the carina. Orogastric or nasogastric tube enters the abdomen. New right arm PICC tip in the SVC above the right atrium. Pulmonary venous hypertension persists. Bilateral pleural effusions with volume loss at the lung bases persists. Previous resection clips in the left hilar region. IMPRESSION: Right arm PICC tip in the SVC just above the right atrium. No other change. Electronically Signed   By: Nelson Chimes M.D.   On: 08/02/2021 18:27   VAS [place] LOWER EXTREMITY VENOUS (DVT)  Result Date: 08/03/2021  Lower Venous DVT Study Patient Name:  DELONTA YOHANNES  Date of Exam:   08/03/2021 Medical Rec #: [id number]          Accession #:    [id number] Date of Birth: 1940-12-22           Patient Gender: M Patient Age:   55 years Exam Location:  [facility] Procedure:      VAS [place] LOWER EXTREMITY VENOUS (DVT) Referring Phys: Ina Homes --------------------------------------------------------------------------------  Indications: Edema.  Risk Factors: Surgery 07/21/2021 - redo of left fem-fem bypass. Limitations: Poor ultrasound/tissue interface, bandages and wound vac & edema. Comparison Study: Previous exam on 04/23/2012, however no results available. Performing Technologist: Rogelia Rohrer RVT, RDMS  Examination Guidelines: A complete evaluation includes B-mode imaging, spectral Doppler, color Doppler, and power Doppler as needed of all accessible portions of each vessel. Bilateral testing is considered an integral part of a complete examination. Limited examinations for reoccurring indications may be performed as noted. The reflux portion of the exam is performed with the patient in reverse Trendelenburg.  +---------+---------------+---------+-----------+----------+--------------+ RIGHT    CompressibilityPhasicitySpontaneityPropertiesThrombus Aging  +---------+---------------+---------+-----------+----------+--------------+ FV Prox  Full           Yes      Yes                                 +---------+---------------+---------+-----------+----------+--------------+ FV Mid   Full           Yes      Yes                                 +---------+---------------+---------+-----------+----------+--------------+ FV DistalFull           Yes      Yes                                 +---------+---------------+---------+-----------+----------+--------------+ PFV      Full                                                        +---------+---------------+---------+-----------+----------+--------------+ POP      Full           Yes      Yes                                 +---------+---------------+---------+-----------+----------+--------------+  PTV      Full                                                        +---------+---------------+---------+-----------+----------+--------------+ PERO     Full                                                        +---------+---------------+---------+-----------+----------+--------------+   Right Technical Findings: Not visualized segments include CFV & SFJ. Bandage and wound vac in right groin  +---------+---------------+---------+-----------+----------+--------------+ LEFT     CompressibilityPhasicitySpontaneityPropertiesThrombus Aging +---------+---------------+---------+-----------+----------+--------------+ FV Prox  Full           Yes      Yes                                 +---------+---------------+---------+-----------+----------+--------------+ FV Mid   Full           Yes      Yes                                 +---------+---------------+---------+-----------+----------+--------------+ FV DistalFull           Yes      Yes                                 +---------+---------------+---------+-----------+----------+--------------+ PFV      Full                                                         +---------+---------------+---------+-----------+----------+--------------+ POP      Full           Yes      Yes                                 +---------+---------------+---------+-----------+----------+--------------+ PTV      Full                                                        +---------+---------------+---------+-----------+----------+--------------+ PERO     Full                                                        +---------+---------------+---------+-----------+----------+--------------+   Left Technical Findings: Not visualized segments include CFV & SFJ. Bandage and wound vac in left groin   Summary: BILATERAL: - No evidence of deep vein thrombosis seen in the lower extremities, bilaterally. -No evidence of  popliteal cyst, bilaterally. RIGHT: - Portions of this examination were limited- see technologist comments above.  LEFT: - Portions of this examination were limited- see technologist comments above.  *See table(s) above for measurements and observations. Electronically signed by Jamelle Haring on 08/03/2021 at 6:49:27 PM.    Final    ECHOCARDIOGRAM LIMITED  Result Date: 08/03/2021    ECHOCARDIOGRAM LIMITED REPORT   Patient Name:   CATRELL MORRONE Date of Exam: 08/03/2021 Medical Rec #:  [id number]         Height:       67.5 in Accession #:    [id number]        Weight:       224.9 lb Date of Birth:  December 23, 1940          BSA:          2.137 m Patient Age:    66 years          BP:           106/67 mmHg Patient Gender: M                 HR:           125 bpm. Exam Location:  Inpatient Procedure: Limited Echo, Cardiac Doppler, Color Doppler and Intracardiac            Opacification Agent STAT ECHO Indications:    Pulmonary Embolus I26.09  History:        Patient has prior history of Echocardiogram examinations, most                 recent 07/27/2021. CAD, Prior CABG, COPD, Arrythmias:Atrial                 Fibrillation;  Risk Factors:Diabetes, Dyslipidemia and                 Hypertension.  Sonographer:    Darlina Sicilian RDCS Referring Phys: [id number] Candee Furbish  Sonographer Comments: Echo performed with patient supine and on artificial respirator. IMPRESSIONS  1. Left ventricular ejection fraction, by estimation, is 60 to 65%. The left ventricle has normal function. The left ventricle has no regional wall motion abnormalities. Left ventricular diastolic function could not be evaluated. There is the interventricular septum is flattened in systole and diastole, consistent with right ventricular pressure and volume overload.  2. Right ventricular systolic function is mildly reduced. The right ventricular size is moderately enlarged.  3. Left atrial size was mildly dilated.  4. The mitral valve is normal in structure. Mild mitral valve regurgitation. No evidence of mitral stenosis.  5. The aortic valve is normal in structure. Aortic valve regurgitation is not visualized. No aortic stenosis is present.  6. The inferior vena cava is normal in size with greater than 50% respiratory variability, suggesting right atrial pressure of 3 mmHg. FINDINGS  Left Ventricle: Left ventricular ejection fraction, by estimation, is 60 to 65%. The left ventricle has normal function. The left ventricle has no regional wall motion abnormalities. Definity contrast agent was given IV to delineate the left ventricular  endocardial borders. The left ventricular internal cavity size was normal in size. There is no left ventricular hypertrophy. The interventricular septum is flattened in systole and diastole, consistent with right ventricular pressure and volume overload. Left ventricular diastolic function could not be evaluated. Left ventricular diastolic function could not be evaluated due to atrial fibrillation. Right Ventricle: The right ventricular size is moderately enlarged. Right ventricular systolic function is mildly  reduced. Left Atrium: Left atrial  size was mildly dilated. Right Atrium: Right atrial size was normal in size. Pericardium: There is no evidence of pericardial effusion. Mitral Valve: The mitral valve is normal in structure. Mild mitral valve regurgitation. No evidence of mitral valve stenosis. Tricuspid Valve: The tricuspid valve is normal in structure. Tricuspid valve regurgitation is mild . No evidence of tricuspid stenosis. Aortic Valve: The aortic valve is normal in structure. Aortic valve regurgitation is not visualized. No aortic stenosis is present. Pulmonic Valve: The pulmonic valve was normal in structure. Pulmonic valve regurgitation is trivial. No evidence of pulmonic stenosis. Aorta: The aortic root is normal in size and structure. Venous: The inferior vena cava is normal in size with greater than 50% respiratory variability, suggesting right atrial pressure of 3 mmHg. IAS/Shunts: The interatrial septum was not well visualized. LEFT VENTRICLE PLAX 2D LVIDd:         5.10 cm LVIDs:         3.80 cm LV PW:         0.70 cm LV IVS:        0.70 cm LVOT diam:     2.10 cm LV SV:         45 LV SV Index:   21 LVOT Area:     3.46 cm  RIGHT VENTRICLE RV S prime:     8.81 cm/s TAPSE (M-mode): 1.8 cm LEFT ATRIUM         Index      RIGHT ATRIUM           Index LA diam:    4.20 cm 1.97 cm/m RA Area:     21.50 cm                                RA Volume:   61.80 ml  28.92 ml/m  AORTIC VALVE LVOT Vmax:   69.50 cm/s LVOT Vmean:  49.600 cm/s LVOT VTI:    0.131 m  SHUNTS Systemic VTI:  0.13 m Systemic Diam: 2.10 cm Kirk Ruths MD Electronically signed by Kirk Ruths MD Signature Date/Time: 08/03/2021/1:16:51 PM    Final     Cardiac Studies     Patient Profile     80 y.o. adult with AFib. PAD  Assessment & Plan    AFib: Now off pressors (neosynephrine).  Diltiazem for rate control if BP tolerates.    Monitor showed ST depression, but ECG showed less prominent ST changes. No STEMI. Likely some demand ischemia with the stress of his  overall illness.   Continue supportive care.     For questions or updates, please contact [place] Please consult [email or web address] for contact info under        Signed, Larae Grooms, MD  08/04/2021, 12:17 PM

## 2021-08-05 ENCOUNTER — Inpatient Hospital Stay (HOSPITAL_COMMUNITY): Payer: Medicare Other

## 2021-08-05 DIAGNOSIS — R578 Other shock: Secondary | ICD-10-CM | POA: Diagnosis not present

## 2021-08-05 DIAGNOSIS — J9601 Acute respiratory failure with hypoxia: Secondary | ICD-10-CM | POA: Diagnosis not present

## 2021-08-05 DIAGNOSIS — I4891 Unspecified atrial fibrillation: Secondary | ICD-10-CM

## 2021-08-05 DIAGNOSIS — I739 Peripheral vascular disease, unspecified: Secondary | ICD-10-CM | POA: Diagnosis not present

## 2021-08-05 DIAGNOSIS — A419 Sepsis, unspecified organism: Secondary | ICD-10-CM | POA: Diagnosis not present

## 2021-08-05 DIAGNOSIS — R6521 Severe sepsis with septic shock: Secondary | ICD-10-CM | POA: Diagnosis not present

## 2021-08-05 LAB — POCT I-STAT 7, (LYTES, BLD GAS, ICA,H+H)
Acid-Base Excess: 1 mmol/L (ref 0.0–2.0)
Acid-Base Excess: 0 mmol/L (ref 0.0–2.0)
Acid-base deficit: 1 mmol/L (ref 0.0–2.0)
Acid-base deficit: 1 mmol/L (ref 0.0–2.0)
Acid-base deficit: 1 mmol/L (ref 0.0–2.0)
Bicarbonate: 26.3 mmol/L (ref 20.0–28.0)
Bicarbonate: 26.4 mmol/L (ref 20.0–28.0)
Bicarbonate: 26.7 mmol/L (ref 20.0–28.0)
Bicarbonate: 26.8 mmol/L (ref 20.0–28.0)
Bicarbonate: 27 mmol/L (ref 20.0–28.0)
Calcium, Ion: 1.04 mmol/L — ABNORMAL LOW (ref 1.15–1.40)
Calcium, Ion: 1.05 mmol/L — ABNORMAL LOW (ref 1.15–1.40)
Calcium, Ion: 1.05 mmol/L — ABNORMAL LOW (ref 1.15–1.40)
Calcium, Ion: 1.05 mmol/L — ABNORMAL LOW (ref 1.15–1.40)
Calcium, Ion: 1.08 mmol/L — ABNORMAL LOW (ref 1.15–1.40)
HCT: 24 % — ABNORMAL LOW (ref 39.0–52.0)
HCT: 24 % — ABNORMAL LOW (ref 39.0–52.0)
HCT: 25 % — ABNORMAL LOW (ref 39.0–52.0)
HCT: 25 % — ABNORMAL LOW (ref 39.0–52.0)
HCT: 25 % — ABNORMAL LOW (ref 39.0–52.0)
Hemoglobin: 8.2 g/dL — ABNORMAL LOW (ref 13.0–17.0)
Hemoglobin: 8.2 g/dL — ABNORMAL LOW (ref 13.0–17.0)
Hemoglobin: 8.5 g/dL — ABNORMAL LOW (ref 13.0–17.0)
Hemoglobin: 8.5 g/dL — ABNORMAL LOW (ref 13.0–17.0)
Hemoglobin: 8.5 g/dL — ABNORMAL LOW (ref 13.0–17.0)
O2 Saturation: 81 %
O2 Saturation: 82 %
O2 Saturation: 85 %
O2 Saturation: 86 %
O2 Saturation: 87 %
Patient temperature: 36.5
Patient temperature: 37.3
Patient temperature: 38.1
Patient temperature: 38.1
Patient temperature: 38.8
Potassium: 3.6 mmol/L (ref 3.5–5.1)
Potassium: 4.1 mmol/L (ref 3.5–5.1)
Potassium: 4.4 mmol/L (ref 3.5–5.1)
Potassium: 5 mmol/L (ref 3.5–5.1)
Potassium: 5.1 mmol/L (ref 3.5–5.1)
Sodium: 155 mmol/L — ABNORMAL HIGH (ref 135–145)
Sodium: 155 mmol/L — ABNORMAL HIGH (ref 135–145)
Sodium: 156 mmol/L — ABNORMAL HIGH (ref 135–145)
Sodium: 156 mmol/L — ABNORMAL HIGH (ref 135–145)
Sodium: 156 mmol/L — ABNORMAL HIGH (ref 135–145)
TCO2: 28 mmol/L (ref 22–32)
TCO2: 28 mmol/L (ref 22–32)
TCO2: 28 mmol/L (ref 22–32)
TCO2: 28 mmol/L (ref 22–32)
TCO2: 29 mmol/L (ref 22–32)
pCO2 arterial: 50.9 mmHg — ABNORMAL HIGH (ref 32.0–48.0)
pCO2 arterial: 54.9 mmHg — ABNORMAL HIGH (ref 32.0–48.0)
pCO2 arterial: 60.5 mmHg — ABNORMAL HIGH (ref 32.0–48.0)
pCO2 arterial: 61.1 mmHg — ABNORMAL HIGH (ref 32.0–48.0)
pCO2 arterial: 63.4 mmHg — ABNORMAL HIGH (ref 32.0–48.0)
pH, Arterial: 7.243 — ABNORMAL LOW (ref 7.350–7.450)
pH, Arterial: 7.249 — ABNORMAL LOW (ref 7.350–7.450)
pH, Arterial: 7.251 — ABNORMAL LOW (ref 7.350–7.450)
pH, Arterial: 7.298 — ABNORMAL LOW (ref 7.350–7.450)
pH, Arterial: 7.331 — ABNORMAL LOW (ref 7.350–7.450)
pO2, Arterial: 48 mmHg — ABNORMAL LOW (ref 83.0–108.0)
pO2, Arterial: 53 mmHg — ABNORMAL LOW (ref 83.0–108.0)
pO2, Arterial: 63 mmHg — ABNORMAL LOW (ref 83.0–108.0)
pO2, Arterial: 65 mmHg — ABNORMAL LOW (ref 83.0–108.0)
pO2, Arterial: 71 mmHg — ABNORMAL LOW (ref 83.0–108.0)

## 2021-08-05 LAB — CBC
HCT: 28.8 % — ABNORMAL LOW (ref 39.0–52.0)
Hemoglobin: 9 g/dL — ABNORMAL LOW (ref 13.0–17.0)
MCH: 28.1 pg (ref 26.0–34.0)
MCHC: 31.3 g/dL (ref 30.0–36.0)
MCV: 90 fL (ref 80.0–100.0)
Platelets: 63 10*3/uL — ABNORMAL LOW (ref 150–400)
RBC: 3.2 MIL/uL — ABNORMAL LOW (ref 4.22–5.81)
RDW: 17.2 % — ABNORMAL HIGH (ref 11.5–15.5)
WBC: 14.4 10*3/uL — ABNORMAL HIGH (ref 4.0–10.5)
nRBC: 0.6 % — ABNORMAL HIGH (ref 0.0–0.2)

## 2021-08-05 LAB — GLUCOSE, CAPILLARY
Glucose-Capillary: 197 mg/dL — ABNORMAL HIGH (ref 70–99)
Glucose-Capillary: 22 mg/dL — CL (ref 70–99)
Glucose-Capillary: 267 mg/dL — ABNORMAL HIGH (ref 70–99)
Glucose-Capillary: 289 mg/dL — ABNORMAL HIGH (ref 70–99)
Glucose-Capillary: 290 mg/dL — ABNORMAL HIGH (ref 70–99)
Glucose-Capillary: 292 mg/dL — ABNORMAL HIGH (ref 70–99)
Glucose-Capillary: 293 mg/dL — ABNORMAL HIGH (ref 70–99)
Glucose-Capillary: 295 mg/dL — ABNORMAL HIGH (ref 70–99)
Glucose-Capillary: 303 mg/dL — ABNORMAL HIGH (ref 70–99)

## 2021-08-05 LAB — BASIC METABOLIC PANEL
Anion gap: 9 (ref 5–15)
Anion gap: 5 (ref 5–15)
BUN: 42 mg/dL — ABNORMAL HIGH (ref 8–23)
BUN: 40 mg/dL — ABNORMAL HIGH (ref 8–23)
CO2: 26 mmol/L (ref 22–32)
CO2: 25 mmol/L (ref 22–32)
Calcium: 7.3 mg/dL — ABNORMAL LOW (ref 8.9–10.3)
Calcium: 6.9 mg/dL — ABNORMAL LOW (ref 8.9–10.3)
Chloride: 119 mmol/L — ABNORMAL HIGH (ref 98–111)
Chloride: 120 mmol/L — ABNORMAL HIGH (ref 98–111)
Creatinine, Ser: 0.93 mg/dL (ref 0.61–1.24)
Creatinine, Ser: 0.84 mg/dL (ref 0.61–1.24)
GFR, Estimated: >60 mL/min (ref >60)
GFR, Estimated: >60 mL/min (ref >60)
Glucose, Bld: 321 mg/dL — ABNORMAL HIGH (ref 70–99)
Glucose, Bld: 306 mg/dL — ABNORMAL HIGH (ref 70–99)
Potassium: 3.8 mmol/L (ref 3.5–5.1)
Potassium: 4.3 mmol/L (ref 3.5–5.1)
Sodium: 154 mmol/L — ABNORMAL HIGH (ref 135–145)
Sodium: 150 mmol/L — ABNORMAL HIGH (ref 135–145)

## 2021-08-05 LAB — PHOSPHORUS
Phosphorus: 1.2 mg/dL — ABNORMAL LOW (ref 2.5–4.6)
Phosphorus: 2.9 mg/dL (ref 2.5–4.6)

## 2021-08-05 LAB — LACTIC ACID, PLASMA: Lactic Acid, Venous: 2.5 mmol/L (ref 0.5–1.9)

## 2021-08-05 LAB — HEPARIN LEVEL (UNFRACTIONATED): Heparin Unfractionated: 0.49 IU/mL (ref 0.30–0.70)

## 2021-08-05 LAB — MAGNESIUM: Magnesium: 2.2 mg/dL (ref 1.7–2.4)

## 2021-08-05 MED ORDER — SODIUM PHOSPHATES 45 MMOLE/15ML IV SOLN
30.0000 mmol | Freq: Once | INTRAVENOUS | Status: AC
  Administered 2021-08-05: 30 mmol via INTRAVENOUS
  Filled 2021-08-05: qty 10

## 2021-08-05 MED ORDER — AMIODARONE HCL IN DEXTROSE 360-4.14 MG/200ML-% IV SOLN
60.0000 mg/h | INTRAVENOUS | Status: AC
  Administered 2021-08-05 (×2): 60 mg/h via INTRAVENOUS
  Filled 2021-08-05: qty 200

## 2021-08-05 MED ORDER — FREE WATER
200.0000 mL | Status: DC
  Administered 2021-08-05 – 2021-08-07 (×23): 200 mL

## 2021-08-05 MED ORDER — VANCOMYCIN HCL 1500 MG/300ML IV SOLN
1500.0000 mg | INTRAVENOUS | Status: DC
  Administered 2021-08-05: 1500 mg via INTRAVENOUS
  Filled 2021-08-05: qty 300

## 2021-08-05 MED ORDER — AMIODARONE HCL IN DEXTROSE 360-4.14 MG/200ML-% IV SOLN
INTRAVENOUS | Status: AC
  Administered 2021-08-05: 150 mg via INTRAVENOUS
  Filled 2021-08-05: qty 200

## 2021-08-05 MED ORDER — SODIUM CHLORIDE 0.45 % IV SOLN: INTRAVENOUS | Status: DC

## 2021-08-05 MED ORDER — ALBUTEROL SULFATE (2.5 MG/3ML) 0.083% IN NEBU
INHALATION_SOLUTION | RESPIRATORY_TRACT | Status: AC
  Administered 2021-08-05: 2.5 mg
  Filled 2021-08-05: qty 3

## 2021-08-05 MED ORDER — NOREPINEPHRINE 16 MG/250ML-% IV SOLN
0.0000 ug/min | INTRAVENOUS | Status: DC
  Administered 2021-08-05: 5 ug/min via INTRAVENOUS
  Administered 2021-08-06 – 2021-08-07 (×2): 16 ug/min via INTRAVENOUS
  Filled 2021-08-05 (×3): qty 250

## 2021-08-05 MED ORDER — AMIODARONE LOAD VIA INFUSION
150.0000 mg | Freq: Once | INTRAVENOUS | Status: AC
  Filled 2021-08-05: qty 83.34

## 2021-08-05 MED ORDER — IPRATROPIUM-ALBUTEROL 0.5-2.5 (3) MG/3ML IN SOLN
3.0000 mL | RESPIRATORY_TRACT | Status: DC
  Administered 2021-08-05 – 2021-08-07 (×11): 3 mL via RESPIRATORY_TRACT
  Filled 2021-08-05 (×11): qty 3

## 2021-08-05 MED ORDER — PHENYLEPHRINE CONCENTRATED 100MG/250ML (0.4 MG/ML) INFUSION SIMPLE
0.0000 ug/min | INTRAVENOUS | Status: DC
  Filled 2021-08-05: qty 250

## 2021-08-05 MED ORDER — AMIODARONE HCL IN DEXTROSE 360-4.14 MG/200ML-% IV SOLN
30.0000 mg/h | INTRAVENOUS | Status: DC
  Administered 2021-08-06 – 2021-08-07 (×3): 30 mg/h via INTRAVENOUS
  Filled 2021-08-05 (×3): qty 200

## 2021-08-05 MED ORDER — METHYLPREDNISOLONE SODIUM SUCC 125 MG IJ SOLR
80.0000 mg | Freq: Every day | INTRAMUSCULAR | Status: AC
  Administered 2021-08-05 – 2021-08-07 (×3): 80 mg via INTRAVENOUS
  Filled 2021-08-05 (×3): qty 2

## 2021-08-05 MED ORDER — FUROSEMIDE 10 MG/ML IJ SOLN
40.0000 mg | Freq: Two times a day (BID) | INTRAMUSCULAR | Status: DC
  Administered 2021-08-05 – 2021-08-07 (×4): 40 mg via INTRAVENOUS
  Filled 2021-08-05 (×4): qty 4

## 2021-08-05 MED ORDER — SODIUM CHLORIDE 0.9 % IV SOLN
0.1100 mg/kg/h | INTRAVENOUS | Status: DC
  Administered 2021-08-05 – 2021-08-06 (×2): 0.1 mg/kg/h via INTRAVENOUS
  Filled 2021-08-05 (×2): qty 250

## 2021-08-05 MED ORDER — QUETIAPINE FUMARATE 25 MG PO TABS
25.0000 mg | ORAL_TABLET | Freq: Two times a day (BID) | ORAL | Status: DC
  Administered 2021-08-05 – 2021-08-07 (×5): 25 mg
  Filled 2021-08-05 (×5): qty 1

## 2021-08-05 MED ORDER — FUROSEMIDE 10 MG/ML IJ SOLN
40.0000 mg | Freq: Once | INTRAMUSCULAR | Status: AC
  Administered 2021-08-05: 40 mg via INTRAVENOUS
  Filled 2021-08-05: qty 4

## 2021-08-05 NOTE — Progress Notes (Signed)
[place] Progress Note Patient Name: DAQUANN MERRIOTT DOB: 06-11-1941 MRN: [id number]   Date of Service  08/05/2021  HPI/Events of Note  Hypophosphatemia - PO4--- = 1.2, K+ = 3.8 and Creatinine = 0.93. Patient already has scheduled K+ replacement.   eICU Interventions  Plan: Will replace PO4---. Repeat PO4--- level at 1 PM.      Intervention Category Major Interventions: Electrolyte abnormality - evaluation and management  Yovany Clock Eugene 08/05/2021, 4:59 AM

## 2021-08-05 NOTE — Progress Notes (Signed)
ANTICOAGULATION CONSULT NOTE   Pharmacy Consult for heparin Indication: bypass patency / AFib  Allergies  Allergen Reactions   Codeine Anaphylaxis   Phenobarbital Hypertension    Patient Measurements: Height: 5' 7.5" (171.5 cm) Weight: 103.1 kg (227 lb 4.7 oz) IBW/kg (Calculated) : 67.25 HEPARIN DW (KG): 87.6   Vital Signs: Temp: 98.2 F (36.8 C) (09/24 1548) Temp Source: Esophageal (09/24 1548) BP: 108/65 (09/24 1500) Pulse Rate: 111 (09/24 1500)  Labs: Recent Labs    08/03/21 0348 08/03/21 0851 08/04/21 0422 08/04/21 0842 08/04/21 1016 08/04/21 1550 08/04/21 1726 08/04/21 1854 08/05/21 0313 08/05/21 1214 08/05/21 1659  HGB 8.4*   < > 9.2*  --  8.8*  --   --  8.8*  --  9.0*  --   HCT 27.2*   < > 29.2*  --  26.0*  --   --  28.6*  --  28.8*  --   PLT 167  --  183  --   --   --   --  127*  --  63*  --   HEPARINUNFRC 0.44  --   --   --   --  0.28*  --   --   --   --  0.49  CREATININE 1.08   < > 0.92 0.89  --   --  0.82  --  0.93  --   --    < > = values in this interval not displayed.     Estimated Creatinine Clearance (by C-G formula based on SCr of 0.93 mg/dL) Male: 59.1 mL/min Male: 71.9 mL/min  Assessment: 55 male s/p right to left femoral-femoral bypass on 9/16. Pharmacy consulted to dose heparin for bypass patency. He is noted on apixaban PTA (last dose taken 07/17/21).   Pt s/p re-do of bypass 9/20, heparin resumed postop. Pt with groin bleeding 9/22 - resumed on 9/23.  Heparin level tonight came back therapeutic at 0.49, on 1500 units/hr. No s/sx of bleeding or infusion issues. Hgb 9, plt now down to 63. Had been 253 on 9/16 with variable trends likely secondary to bleeding and product replacement. 4 Ts score is 4 (intermediate risk). Discussed with CCM and will change to bivalirudin at this time and send off heparin induced antibody.   Goal of Therapy:  aPTT 50-70s  Monitor platelets by anticoagulation protocol: Yes   Plan:  -Discontinue heparin  infusion -Will switch to bivalirudin infusion at 0.1 mg/kg/hr  -Will order aPTT in 4 hours  -Monitor daily aPTT, CBC, and for s/sx of bleeding   Antonietta Jewel, PharmD, BCCCP Clinical Pharmacist  Phone: [phone] 08/05/2021 5:46 PM  Please check AMION for all [place] phone numbers After 10:00 PM, call [place] [phone]

## 2021-08-05 NOTE — Progress Notes (Signed)
eLink notified, pt sats decreasing to 85-90%, BP cuff 84 sys, Neo close to being maxed out, a new presser may be required.Hr in the 70s, afib continued despite amio bolus and gtt infusing.  Family at the bedside, continuing to assess and monitor, and await new orders

## 2021-08-05 NOTE — Progress Notes (Signed)
Two finger sticks for blood glucose were taken and erratic, art line draw of 267 used for insulin admin

## 2021-08-05 NOTE — Progress Notes (Signed)
Progress Note  Patient Name: Paul [name] Date of Encounter: 08/05/2021  [facility] Cardiologist: Kirk Ruths, MD   Subjective   Intubated sedate gravely ill  Inpatient Medications    Scheduled Meds:  sodium chloride   Intravenous Once   sodium chloride   Intravenous Once   sodium chloride   Intravenous Once   sodium chloride   Intravenous Once   atorvastatin  80 mg Per Tube Daily   chlorhexidine gluconate (MEDLINE KIT)  15 mL Mouth Rinse BID   Chlorhexidine Gluconate Cloth  6 each Topical Daily   dextrose  1 ampule Intravenous Once   docusate  100 mg Per Tube Daily   feeding supplement (PROSource TF)  45 mL Per Tube TID   free water  200 mL Per Tube Q4H   gabapentin  600 mg Per Tube QHS   insulin aspart  0-20 Units Subcutaneous Q4H   mouth rinse  15 mL Mouth Rinse 10 times per day   pantoprazole sodium  40 mg Per Tube Daily   potassium chloride  40 mEq Per Tube BID   sodium chloride flush  10-40 mL Intracatheter Q12H   Continuous Infusions:  sodium chloride     sodium chloride 75 mL/hr at 08/05/21 0800   sodium chloride     sodium chloride     ceFEPime (MAXIPIME) IV Stopped (08/05/21 0657)   dexmedetomidine (PRECEDEX) IV infusion 0.5 mcg/kg/hr (08/05/21 0800)   feeding supplement (VITAL 1.5 CAL) 1,000 mL (08/04/21 0924)   fentaNYL infusion INTRAVENOUS 50 mcg/hr (08/05/21 0800)   heparin 1,500 Units/hr (08/05/21 0800)   magnesium sulfate bolus IVPB     phenylephrine (NEO-SYNEPHRINE) Adult infusion 80 mcg/min (08/05/21 0800)   sodium phosphate  Dextrose 5% IVPB 43 mL/hr at 08/05/21 0800   vancomycin Stopped (08/05/21 0607)   PRN Meds: sodium chloride, Place/Maintain arterial line **AND** sodium chloride, acetaminophen (TYLENOL) oral liquid 160 mg/5 mL, bisacodyl, fentaNYL, guaiFENesin-dextromethorphan, magnesium sulfate bolus IVPB, morphine injection, sodium chloride flush   Vital Signs    Vitals:   08/05/21 0730 08/05/21 0745 08/05/21 0800 08/05/21  0803  BP: 114/63  104/62   Pulse: 74 69 79 78  Resp: (!) 28 (!) 28 (!) 28 (!) 28  Temp:   98.1 F (36.7 C)   TempSrc:   Esophageal   SpO2: 98% 98% 99% 98%  Weight:      Height:        Intake/Output Summary (Last 24 hours) at 08/05/2021 0941 Last data filed at 08/05/2021 0800 Gross per 24 hour  Intake 5527.56 ml  Output 3305 ml  Net 2222.56 ml   Last 3 Weights 08/05/2021 08/04/2021 08/03/2021  Weight (lbs) 227 lb 4.7 oz 218 lb 7.6 oz 224 lb 13.9 oz  Weight (kg) 103.1 kg 99.1 kg 102 kg      Telemetry    Atrial fibrillation, heart rate currently in the 50s to 68s.- Personally Reviewed  ECG    08/04/2021-atrial fibrillation heart rate in the 80s with bifascicular block- Personally Reviewed  Physical Exam   GEN: Gravely ill Neck: Intubated sedate Cardiac: Irregularly irregular, no murmurs, rubs, or gallops.  Respiratory: Vent noise bilaterally. GI: Soft, nontender, non-distended  MS: Femorofemoral scars noted dressed. Neuro: Unable to fully assess Psych: Unable to assess  Labs    High Sensitivity Troponin:  No results for input(s): TROPONINIHS in the last 720 hours.   Chemistry Recent Labs  Lab 08/03/21 2224 08/04/21 0422 08/04/21 3710 08/04/21 1016 08/04/21 1726 08/05/21 0313  NA 149* 149* 148* 152* 149* 154*  K 2.9* 3.3* 3.0* 3.2* 3.3* 3.8  CL 115* 112* 108  --  116* 119*  CO2 _0 --  25 26  GLUCOSE 231* 340* 360*  --  417* 321*  BUN 38* 39* 36*  --  43* 42*  CREATININE 0.98 0.92 0.89  --  0.82 0.93  CALCIUM 7.7* 7.5* 6.9*  --  7.1* 7.3*  MG 2.1 2.1  --   --   --  2.2  PROT  --   --  4.4*  --   --   --   ALBUMIN  --   --  2.1*  --   --   --   AST  --   --  22  --   --   --   ALT  --   --  17  --   --   --   ALKPHOS  --   --  66  --   --   --   BILITOT  --   --  1.3*  --   --   --   GFRNONAA >60 >60 >60  --  >60 >60  ANIONGAP _1 --  8 9    Lipids No results for input(s): CHOL, TRIG, HDL, LABVLDL, LDLCALC, CHOLHDL in the last 168 hours.   Hematology Recent Labs  Lab 08/03/21 0348 08/03/21 0851 08/04/21 0422 08/04/21 1016 08/04/21 1854  WBC 8.5  --  12.4*  --  15.8*  RBC 3.00*  --  3.29*  --  3.18*  HGB 8.4*   < > 9.2* 8.8* 8.8*  HCT 27.2*   < > 29.2* 26.0* 28.6*  MCV 90.7  --  88.8  --  89.9  MCH 28.0  --  28.0  --  27.7  MCHC 30.9  --  31.5  --  30.8  RDW 17.1*  --  17.1*  --  17.1*  PLT 167  --  183  --  127*   < > = values in this interval not displayed.   Thyroid No results for input(s): TSH, FREET4 in the last 168 hours.  BNPNo results for input(s): BNP, PROBNP in the last 168 hours.  DDimer No results for input(s): DDIMER in the last 168 hours.   Radiology    CT ABDOMEN PELVIS WO CONTRAST  Result Date: 08/04/2021 CLINICAL DATA:  Abdominal distension EXAM: CT ABDOMEN AND PELVIS WITHOUT CONTRAST TECHNIQUE: Multidetector CT imaging of the abdomen and pelvis was performed following the standard protocol without IV contrast. COMPARISON:  07/31/2021, 07/19/2021 FINDINGS: Lower chest: There is progressive bibasilar consolidation, asymmetrically more severe within the left lung base with scattered areas of mucous plugging best appreciated within the left lower lobe suspicious for changes of aspiration. Small bilateral pleural effusions have developed. Heterogeneous high attenuation within the left pleural effusion with punctate foci of gas simply represent collapsed and consolidated lung. Superimposed emphysema. Advanced multi-vessel coronary artery calcification. Global cardiac size within normal limits. Hepatobiliary: No focal liver abnormality is seen. Status post cholecystectomy. No biliary dilatation. 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 Small left adrenal myelolipoma also unchanged. Kidneys are unremarkable. Bladder is decompressed with a Foley catheter balloon seen within its lumen. Stomach/Bowel: Interval development of mild ascites.  Nasogastric tube is seen within a decompressed gastric lumen. Stomach, small bowel, and large bowel are otherwise unremarkable. Appendix normal. No free intraperitoneal gas. Vascular/Lymphatic: Extensive  aortoiliac atherosclerotic calcification. No aortic aneurysm. Interval femoral femoral bypass grafting has been performed moderate subcutaneous gas and fluid is seen surrounding the bypass graft, likely postsurgical in nature. Previously noted large hematoma within the inferior pannus has been significantly evacuated. The patency of the graft is not well assessed on this noncontrast examination. Extensive atherosclerotic calcification is seen within the visualized lower extremity arterial outflow. No pathologic adenopathy within the abdomen and pelvis. Reproductive: Prostate is unremarkable. Other: There is moderate diffuse subcutaneous body wall edema, new since prior examination. Musculoskeletal: No acute bone abnormality. Osseous structures are diffusely osteopenic. Compression fractures of T12 and L1 status post vertebroplasty are again identified. IMPRESSION: Interval subtotal evacuation of a hematoma within the inferior pannus and femoral femoral bypass grafting. Small residual fluid and subcutaneous gas surrounds the bypass graft, likely postsurgical in nature. Patency of the graft is not well assessed on this noncontrast examination. Progressive bibasilar pulmonary consolidation, asymmetrically more severe within the left lung base, likely the sequela of aspiration. Emphysema. Extensive multi-vessel coronary artery calcification. Interval development of anasarca with small bilateral pleural effusions, mild ascites, and diffuse body wall subcutaneous edema. Stable bilateral adrenal myelolipoma is, measuring up to 7.5 cm on the right. Aortic Atherosclerosis (ICD10-I70.0) and Emphysema (ICD10-J43.9). Electronically Signed   By: Fidela Salisbury M.D.   On: 08/04/2021 23:48   DG CHEST PORT 1 VIEW  Result Date:  08/05/2021 CLINICAL DATA:  Fever, respiratory failure EXAM: PORTABLE CHEST 1 VIEW COMPARISON:  08/03/2021 FINDINGS: Endotracheal tube is seen 6.1 cm above the carina. Nasogastric tube extends into the upper abdomen beyond the margin of the examination. Pulmonary insufflation is stable. Superimposed multifocal pulmonary infiltrates, more focal within the right upper lobe and lung bases bilaterally appears stable when accounting for changes in radiographic technique. No pneumothorax. Small left pleural effusion is present. Cardiac size is within normal limits. Numerous surgical clips are seen within the expected aortopulmonary window. Pulmonary vascularity is normal. No acute bone abnormality. IMPRESSION: Stable support tubes. Stable pulmonary insufflation. Stable multifocal pulmonary infiltrates, likely infectious or inflammatory. Small left pleural effusion again noted. Electronically Signed   By: Fidela Salisbury M.D.   On: 08/05/2021 03:45   VAS [place] LOWER EXTREMITY VENOUS (DVT)  Result Date: 08/03/2021  Lower Venous DVT Study Patient Name:  Paul [name]  Date of Exam:   08/03/2021 Medical Rec #: [id number]          Accession #:    [id number] Date of Birth: 28-Nov-1940           Patient Gender: M Patient Age:   46 years Exam Location:  [facility] Procedure:      VAS [place] LOWER EXTREMITY VENOUS (DVT) Referring Phys: Ina Homes --------------------------------------------------------------------------------  Indications: Edema.  Risk Factors: Surgery 07/21/2021 - redo of left fem-fem bypass. Limitations: Poor ultrasound/tissue interface, bandages and wound vac & edema. Comparison Study: Previous exam on 04/23/2012, however no results available. Performing Technologist: Rogelia Rohrer RVT, RDMS  Examination Guidelines: A complete evaluation includes B-mode imaging, spectral Doppler, color Doppler, and power Doppler as needed of all accessible portions of each vessel. Bilateral testing is considered an integral part  of a complete examination. Limited examinations for reoccurring indications may be performed as noted. The reflux portion of the exam is performed with the patient in reverse Trendelenburg.  +---------+---------------+---------+-----------+----------+--------------+ RIGHT    CompressibilityPhasicitySpontaneityPropertiesThrombus Aging +---------+---------------+---------+-----------+----------+--------------+ FV Prox  Full           Yes      Yes                                 +---------+---------------+---------+-----------+----------+--------------+  FV Mid   Full           Yes      Yes                                 +---------+---------------+---------+-----------+----------+--------------+ FV DistalFull           Yes      Yes                                 +---------+---------------+---------+-----------+----------+--------------+ PFV      Full                                                        +---------+---------------+---------+-----------+----------+--------------+ POP      Full           Yes      Yes                                 +---------+---------------+---------+-----------+----------+--------------+ PTV      Full                                                        +---------+---------------+---------+-----------+----------+--------------+ PERO     Full                                                        +---------+---------------+---------+-----------+----------+--------------+   Right Technical Findings: Not visualized segments include CFV & SFJ. Bandage and wound vac in right groin  +---------+---------------+---------+-----------+----------+--------------+ LEFT     CompressibilityPhasicitySpontaneityPropertiesThrombus Aging +---------+---------------+---------+-----------+----------+--------------+ FV Prox  Full           Yes      Yes                                  +---------+---------------+---------+-----------+----------+--------------+ FV Mid   Full           Yes      Yes                                 +---------+---------------+---------+-----------+----------+--------------+ FV DistalFull           Yes      Yes                                 +---------+---------------+---------+-----------+----------+--------------+ PFV      Full                                                        +---------+---------------+---------+-----------+----------+--------------+  POP      Full           Yes      Yes                                 +---------+---------------+---------+-----------+----------+--------------+ PTV      Full                                                        +---------+---------------+---------+-----------+----------+--------------+ PERO     Full                                                        +---------+---------------+---------+-----------+----------+--------------+   Left Technical Findings: Not visualized segments include CFV & SFJ. Bandage and wound vac in left groin   Summary: BILATERAL: - No evidence of deep vein thrombosis seen in the lower extremities, bilaterally. -No evidence of popliteal cyst, bilaterally. RIGHT: - Portions of this examination were limited- see technologist comments above.  LEFT: - Portions of this examination were limited- see technologist comments above.  *See table(s) above for measurements and observations. Electronically signed by Jamelle Haring on 08/03/2021 at 6:49:27 PM.    Final    ECHOCARDIOGRAM LIMITED  Result Date: 08/03/2021    ECHOCARDIOGRAM LIMITED REPORT   Patient Name:   Paul [name] Date of Exam: 08/03/2021 Medical Rec #:  [id number]         Height:       67.5 in Accession #:    [id number]        Weight:       224.9 lb Date of Birth:  [birth year]          BSA:          2.137 m Patient Age:    80 years          BP:           106/67 mmHg Patient Gender: M                  HR:           125 bpm. Exam Location:  Inpatient Procedure: Limited Echo, Cardiac Doppler, Color Doppler and Intracardiac            Opacification Agent STAT ECHO Indications:    Pulmonary Embolus I26.09  History:        Patient has prior history of Echocardiogram examinations, most                 recent 07/27/2021. CAD, Prior CABG, COPD, Arrythmias:Atrial                 Fibrillation; Risk Factors:Diabetes, Dyslipidemia and                 Hypertension.  Sonographer:    Darlina Sicilian RDCS Referring Phys: [id number] Candee Furbish  Sonographer Comments: Echo performed with patient supine and on artificial respirator. IMPRESSIONS  1. Left ventricular ejection fraction, by estimation, is 60 to 65%. The left ventricle has normal function. The left ventricle has no regional wall motion abnormalities. Left ventricular  diastolic function could not be evaluated. There is the interventricular septum is flattened in systole and diastole, consistent with right ventricular pressure and volume overload.  2. Right ventricular systolic function is mildly reduced. The right ventricular size is moderately enlarged.  3. Left atrial size was mildly dilated.  4. The mitral valve is normal in structure. Mild mitral valve regurgitation. No evidence of mitral stenosis.  5. The aortic valve is normal in structure. Aortic valve regurgitation is not visualized. No aortic stenosis is present.  6. The inferior vena cava is normal in size with greater than 50% respiratory variability, suggesting right atrial pressure of 3 mmHg. FINDINGS  Left Ventricle: Left ventricular ejection fraction, by estimation, is 60 to 65%. The left ventricle has normal function. The left ventricle has no regional wall motion abnormalities. Definity contrast agent was given IV to delineate the left ventricular  endocardial borders. The left ventricular internal cavity size was normal in size. There is no left ventricular hypertrophy. The interventricular septum  is flattened in systole and diastole, consistent with right ventricular pressure and volume overload. Left ventricular diastolic function could not be evaluated. Left ventricular diastolic function could not be evaluated due to atrial fibrillation. Right Ventricle: The right ventricular size is moderately enlarged. Right ventricular systolic function is mildly reduced. Left Atrium: Left atrial size was mildly dilated. Right Atrium: Right atrial size was normal in size. Pericardium: There is no evidence of pericardial effusion. Mitral Valve: The mitral valve is normal in structure. Mild mitral valve regurgitation. No evidence of mitral valve stenosis. Tricuspid Valve: The tricuspid valve is normal in structure. Tricuspid valve regurgitation is mild . No evidence of tricuspid stenosis. Aortic Valve: The aortic valve is normal in structure. Aortic valve regurgitation is not visualized. No aortic stenosis is present. Pulmonic Valve: The pulmonic valve was normal in structure. Pulmonic valve regurgitation is trivial. No evidence of pulmonic stenosis. Aorta: The aortic root is normal in size and structure. Venous: The inferior vena cava is normal in size with greater than 50% respiratory variability, suggesting right atrial pressure of 3 mmHg. IAS/Shunts: The interatrial septum was not well visualized. LEFT VENTRICLE PLAX 2D LVIDd:         5.10 cm LVIDs:         3.80 cm LV PW:         0.70 cm LV IVS:        0.70 cm LVOT diam:     2.10 cm LV SV:         45 LV SV Index:   21 LVOT Area:     3.46 cm  RIGHT VENTRICLE RV S prime:     8.81 cm/s TAPSE (M-mode): 1.8 cm LEFT ATRIUM         Index      RIGHT ATRIUM           Index LA diam:    4.20 cm 1.97 cm/m RA Area:     21.50 cm                                RA Volume:   61.80 ml  28.92 ml/m  AORTIC VALVE LVOT Vmax:   69.50 cm/s LVOT Vmean:  49.600 cm/s LVOT VTI:    0.131 m  SHUNTS Systemic VTI:  0.13 m Systemic Diam: 2.10 cm Kirk Ruths MD Electronically signed by Kirk Ruths MD Signature Date/Time: 08/03/2021/1:16:51 PM    Final  Cardiac Studies   ECHO this admit    1. Left ventricular ejection fraction, by estimation, is 60 to 65%. The  left ventricle has normal function. The left ventricle has no regional  wall motion abnormalities. Left ventricular diastolic function could not  be evaluated. There is the  interventricular septum is flattened in systole and diastole, consistent  with right ventricular pressure and volume overload.   2. Right ventricular systolic function is mildly reduced. The right  ventricular size is moderately enlarged.   3. Left atrial size was mildly dilated.   4. The mitral valve is normal in structure. Mild mitral valve  regurgitation. No evidence of mitral stenosis.   5. The aortic valve is normal in structure. Aortic valve regurgitation is  not visualized. No aortic stenosis is present.   6. The inferior vena cava is normal in size with greater than 50%  respiratory variability, suggesting right atrial pressure of 3 mmHg.   Patient Profile     80 y.o. adult with AFIB, PAD, fever, septic shock, post op fem fem revision  Assessment & Plan     Atrial fibrillation - Heart rates currently stable in the 70s. - On heparin IV for anticoagulation - On phenylephrine for blood pressure support in the setting of shock. - Currently not on any AV nodal blocking agents.  Hyperlipidemia - On atorvastatin 80 mg per tube.  Fever - Up to 103.2 refractory to conservative measures.  Vancomycin was added.  Peripheral arterial disease -07/23/2021 revision right limb of fem-fem by Dr. Virl Cagey 07/27/2021 Fem-fem bypass by Dr. Carlis Abbott  Hypernatremia - Serum sodium rising.  Free water.  Per critical care team.  Septic shock - Currently on phenylephrine.  CRITICAL CARE Performed by: Candee Furbish   Total critical care time: 32 minutes  Critical care time was exclusive of separately billable procedures and treating other  patients.  Critical care was necessary to treat or prevent imminent or life-threatening deterioration.  Critical care was time spent personally by me on the following activities: development of treatment plan with patient and/or surrogate as well as nursing, discussions with consultants, evaluation of patient's response to treatment, examination of patient, obtaining history from patient or surrogate, ordering and performing treatments and interventions, ordering and review of laboratory studies, ordering and review of radiographic studies, pulse oximetry and re-evaluation of patient's condition.   For questions or updates, please contact [facility] Please consult [email or web address] for contact info under        Signed, Candee Furbish, MD  08/05/2021, 9:41 AM

## 2021-08-05 NOTE — Progress Notes (Signed)
NAME:  Paul [name], MRN:  [id number], DOB:  [birth year], LOS: 8 ADMISSION DATE:  08/03/2021, CONSULTATION DATE:  07/17/2021 REFERRING MD:  Dr. Scot Dock, CHIEF COMPLAINT:     History of Present Illness:  HPI obtained from medical chart review as patient remains sedated and intubated on mechanical ventilation.   80 year old male with medical history of tobacco abuse, COPD, prior lung cancer s/p wedge resection LLL 2007, PE (2013), PAD, CAD, afib on Eliquis, DM, HTN, and HLD admitted to vascular surgery on 9/16 for right to left femorofemoral bypass after left great toe wound x 3 months with unsuccessful retrograde and antegrade attempts from brachial access to cross left common iliac occlusion by Dr. Fletcher Anon on 07/19/21.  Patient is followed in our office by Dr. Melvyn Novas.  He continues to smoke.     Patient underwent right to left femoral artery bypass on 9/16.  Course complicated by postop bleeding and hematoma requiring return to the OR on 9/17 for bilateral groin exploration, right iliofemoral embolectomy, left iliofemoral embolectomy, and femoral-femoral bypass revision.  Patient was seen by Cardiology on 9/19 for atrial fibrillation with rapid ventricular rate in which Cardizem was added and maintained on heparin gtt per pharmacy.  Also noted during this time patient had some shortness of breath with CXR showing lung hyperexpansion and chronic bronchitic changes and minimal bibasilar atelectasis without acute process.  He was given lasix with reported improvement.  However, he was found on repeat imaging to have re-occluded bypass prompting return third return to OR on 9/20 for redo of right to left femorofemoral bypass.  He returns the ICU on mechanical ventilation, PCCM consulted for vent management.    Pertinent  Medical History  Ongoing tobacco abuse, COPD, GERD, DM, CAD s/p PCI 1988, Afib on Eliquis, PAD, HTN, HLD, PE 2013, Stage IA (T1, N0, MX) non-small cell lung cancer, adenocarcinoma s/p wedge  resection of LLL and seed implant 01/2006  Significant Hospital Events: Including procedures, antibiotic start and stop dates in addition to other pertinent events   9/16 admitted for right to left femoral artery bypass on 9/16 9/17 post op hematoma/ bleeding, back to OR for s/p bilateral groin exploration, right iliofemoral embolectomy, left iliofemoral embolectomy, femoral-femoral bypass revision 9/19 cards consulted for Afib with RVR-> started on cardizem.  SOB s/p lasix 9/20 CT showing re-occlusion -> back to OR for redo bypass, returns to ICU on mechanical ventilation; PCCM consulted 9/24 stable vasopressor requirements and tolerated brief SBT  Interim History / Subjective:   Weaning sedation. Patient now has systolic hypertension. Tolerated 48mn of SBT.  Objective   Blood pressure (!) 97/53, pulse 91, temperature 98.1 F (36.7 C), temperature source Esophageal, resp. rate (!) 33, height 5' 7.5" (1.715 m), weight 103.1 kg, SpO2 94 %.    Vent Mode: PRVC FiO2 (%):  [40 %] 40 % Set Rate:  [28 bmp] 28 bmp Vt Set:  [530 mL] 530 mL PEEP:  [8 cmH20-10 cmH20] 8 cmH20 Plateau Pressure:  [14 cmH20-24 cmH20] 14 cmH20   Intake/Output Summary (Last 24 hours) at 08/05/2021 1249 Last data filed at 08/05/2021 1200 Gross per 24 hour  Intake 5352.12 ml  Output 3030 ml  Net 2322.12 ml    Filed Weights   08/03/21 0353 08/04/21 0417 08/05/21 0323  Weight: 102 kg 99.1 kg 103.1 kg    Examination: Gen- chronically and critically ill appearing older adult M intubated sedated NAD  Neuro: sedated pinpoint pupils. Does not follow commands  HEENT: NCAT  ETT secure anicteric sclera  Pulm: Symmetrical chest expansion. Mechanically ventilated. Diminished basilar sounds, Tachypnea.  CV: rr s1s2 no rgm  GI- pannus. Soft ndnt.  GU: edematous penis and scrotum. Skin tear at meatus. Foley  Ext: LUE cyanosis. Stable signs of arterial insufficiency both feet.  Skin- pale, scattered ecchymosis over abdomen.  Groin hematomas.   Resolved Hospital Problem list   Metabolic acidosis  Assessment & Plan:   Critically ill due to acute respiratory failure with hypoxia requiring mechanical ventilation.  Delirium due to cute metabolic encephalopathy Hx COPD. LLE limb ischemia, occluded L iliac artery - s/p R to L femoral artery bypass 9/16.  Course complicated by postop bleeding prompting return OR trip 9/17 with revision  including right and left iliofemoral embolectomy.  Course further complicated by occluded bypass prompting second OR trip 9/20 for redo of R to L femoral - femoral bypass. Post-op hematoma - no further signs of bleeding Hx Afib Hx HTN, HLD. Anemia, acute on chronic  DM2  Right basilar lung nodule   Plan:  - Add Seroquel to facilitate sedation weaning.  - Titrate phenylephrine to keep SBP>120 as low DBP from arteriosclerosis.  - Diurese as tolerated. - Continue daily SBT - Should improve with diuresis.  - Complete 7 days of cefepime for possible UTI - 2.4 cm  RLL nodule, almost appears spiculated, seen on CT 07/22/2021. Previous hx of tage IA (T1, N0, MX) non-small cell lung cancer, adenocarcinoma s/p wedge resection and seed implants of LLL 01/2006.  Appears to last been seen by oncology in 2015.  Can f/u with oncology or follow up in our office with either Dr. Valeta Harms or Dr. Lamonte Sakai for further diagnostic evaluation.   Best Practice (right click and "Reselect all SmartList Selections" daily)   Diet/type: NPO DVT prophylaxis: systemic heparin GI prophylaxis: PPI Lines: N/A Foley:  Yes, and it is still needed Code Status:  full code Last date of multidisciplinary goals of care discussion: 9/24 - spoke with daughter, son and wife.  They asked appropriate questions regarding long-term prognosis. I explained to them that he still has a window to recovery given that he's normotensive, has normal renal function and is starting to wean. For him to have a realistic chance of recovery he  needs to continue to progress and not have further setbacks. They understand and agree that a transition to comfort care would be appropriate in case of decline or failure to progress in the next week.   CRITICAL CARE Performed by: Kipp Brood   Total critical care time: 50 minutes  Critical care time was exclusive of separately billable procedures and treating other patients.  Critical care was necessary to treat or prevent imminent or life-threatening deterioration.  Critical care was time spent personally by me on the following activities: development of treatment plan with patient and/or surrogate as well as nursing, discussions with consultants, evaluation of patient's response to treatment, examination of patient, obtaining history from patient or surrogate, ordering and performing treatments and interventions, ordering and review of laboratory studies, ordering and review of radiographic studies, pulse oximetry and re-evaluation of patient's condition.  Kipp Brood, MD [facility] ICU Physician [place]  Pager: [phone] Or Epic Secure Chat After hours: [phone].  08/05/2021, 1:07 PM     08/05/2021, 12:49 PM

## 2021-08-05 NOTE — Progress Notes (Addendum)
eLink Physician-Brief Progress Note Patient Name: Paul [name] DOB: 12/05/1940 MRN: [id number]   Date of Service  08/05/2021  HPI/Events of Note  Pt O2 sats reading 88-90 on O2 probe. Bedside will like to draw an ABG since a follow up one was not done earlier after vent changes around 1640. May have order?  Last ABG: 7.33/50/48/27 from 4:40 PM  Current vent settings 60%/ 8peep/ 28 RR/ 530    PRVC  LLE limb ischemia, occluded L iliac artery - s/p R to L femoral artery bypass 9/16.  Course complicated by postop bleeding prompting return OR trip 9/17 with revision  including right and left iliofemoral embolectomy.  Course further complicated by occluded bypass prompting second OR trip 9/20 for redo of R to L femoral - femoral bypass. Post-op hematoma - no further signs of bleeding Delirium. AHRF, a fib, COPD, HTN,   eICU Interventions  ABG stat ordered     Intervention Category Intermediate Interventions: Respiratory distress - evaluation and management  Elmer Sow 08/05/2021, 7:40 PM  19:55 ABG: reviewed On Vasopressor. -discussed with RN. Increase PEEP to 10, fio2 to 70%. Prn ABG.  - hypernatremia and anemia stable. Continue current care.   21:19 Sodium 150 .Cr normal. Hyperchloremic.  Free water 200 ml q4 hr via NG tube.  LA 2.5.  sugar elevated on coverage. On Neosynephrine 350-  on saline).  EF 60%. CVP 14 on abx.    - since HR is better with amiodarone, try switch for levo from neo  22:27 Abg reviewed. Fio2 at 80% now. Metabolic and respiratory Acidosis stable.   00:14 Discussed with RN. ABG reviewed. Camera eval: Sats 93% on fio2 80%. Art line MAP 66, SBP 104. In synchrony with Vent.  - start Vasopressin 0.03 and wean off neo  - get CxR once.   01:35 CxR film seen. Overall stable to improving  rt lower zone predominant air space densities, left effusion. Lees than right side. ET in Place. No Pneumothorax. Continue current care. Keep neutral to neg  balance.   5:55 LA ordered. ABG; improving P/F ratio. MAP > 65.

## 2021-08-05 NOTE — Progress Notes (Signed)
[facility] [place] [facility] Progress Note Patient Name: Paul [name] DOB: January 25, 1941 MRN: [id number]   Date of Service  08/05/2021  HPI/Events of Note  Fever to 103.2 F refractory to Tylenol, ice packs and cooling blanket. Currently on Abx Rx with Cefepime. Tracheal aspirate gram stain from 9/22 reveals abundant WBC predominantly PMN's/ Moderate GNRs and Moderate GPCs.  eICU Interventions  Plan: Portable CXR STAT. Blood cultures X 2. Add Vancomycin per pharmacy consultation.     Intervention Category Major Interventions: Infection - evaluation and management  Zamir Staples [place] 08/05/2021, 3:23 AM

## 2021-08-05 NOTE — Progress Notes (Signed)
Pharmacy Antibiotic Note  Paul [name] is a 80 y.o. adult admitted on 08/05/2021 with PVD s/p bypass. Pt now with fevers and unclear source. Pharmacy has been consulted for cefepime dosing. CrCl ok. Will increase cefepime dose to cover for non-UTI infections.  Pt still febrile so vanc will be added empirically. Respiratory culture gram stain showed GNR/GPC  Scr 0.82  Plan: Cefepime 2g IV q8h Vanc 1.5g IV q24>>AUC 476, scr 0.82 F/U cultures  Height: 5' 7.5" (171.5 cm) Weight: 103.1 kg (227 lb 4.7 oz) IBW/kg (Calculated) : 67.25  Temp (24hrs), Avg:101.2 F (38.4 C), Min:96.3 F (35.7 C), Max:103.2 F (39.6 C)  Recent Labs  Lab 07/21/2021 2003 08/02/21 0456 08/03/21 0348 08/03/21 0849 08/03/21 1213 08/03/21 2224 08/04/21 0422 08/04/21 0842 08/04/21 1726 08/04/21 1854  WBC 14.9* 16.3* 8.5  --   --   --  12.4*  --   --  15.8*  CREATININE 0.89 0.90 1.08  --   --  0.98 0.92 0.89 0.82  --   LATICACIDVEN  --   --   --  1.1 1.1  --   --   --   --   --      Estimated Creatinine Clearance (by C-G formula based on SCr of 0.82 mg/dL) Male: 67 mL/min Male: 81.5 mL/min    Allergies  Allergen Reactions   Codeine Anaphylaxis   Phenobarbital Hypertension    Onnie Boer, PharmD, BCIDP, AAHIVP, CPP Infectious Disease Pharmacist 08/05/2021 3:33 AM

## 2021-08-05 NOTE — Progress Notes (Signed)
  Progress Note    08/05/2021 9:54 AM 4 Days Post-Op  Subjective: Intubated and sedated  Vitals:   08/05/21 0800 08/05/21 0803  BP: 104/62   Pulse: 79 78  Resp: (!) 28 (!) 28  Temp: 98.1 F (36.7 C)   SpO2: 99% 98%    Physical Exam: Intubated, sedated Wound vacs to bilateral groins have dark sanguinous drainage in the container they are to suction There are strong posterior tibial signals bilaterally Left hand with some distal ischemia I cannot significantly appreciate any ulnar or radial signals at the wrist level There is diffuse anasarca  CBC    Component Value Date/Time   WBC 15.8 (H) 08/04/2021 1854   RBC 3.18 (L) 08/04/2021 1854   HGB 8.8 (L) 08/04/2021 1854   HGB 13.7 07/04/2021 1212   HGB 14.1 01/13/2014 0922   HCT 28.6 (L) 08/04/2021 1854   HCT 42.1 07/04/2021 1212   HCT 42.8 01/13/2014 0922   PLT 127 (L) 08/04/2021 1854   PLT 219 07/04/2021 1212   MCV 89.9 08/04/2021 1854   MCV 83 07/04/2021 1212   MCV 83.9 01/13/2014 0922   MCH 27.7 08/04/2021 1854   MCHC 30.8 08/04/2021 1854   RDW 17.1 (H) 08/04/2021 1854   RDW 15.2 07/04/2021 1212   RDW 15.3 (H) 01/13/2014 0922   LYMPHSABS 1.1 07/20/2021 0947   LYMPHSABS 1.5 01/13/2014 0922   MONOABS 0.8 07/20/2021 0947   MONOABS 0.8 01/13/2014 0922   EOSABS 0.1 07/20/2021 0947   EOSABS 0.2 01/13/2014 0922   BASOSABS 0.0 07/20/2021 0947   BASOSABS 0.1 01/13/2014 0922    BMET    Component Value Date/Time   NA 154 (H) 08/05/2021 0313   NA 146 (H) 07/04/2021 1212   NA 140 01/13/2014 0922   K 3.8 08/05/2021 0313   K 4.6 01/13/2014 0922   CL 119 (H) 08/05/2021 0313   CL 105 01/13/2013 0856   CO2 26 08/05/2021 0313   CO2 26 01/13/2014 0922   GLUCOSE 321 (H) 08/05/2021 0313   GLUCOSE 112 01/13/2014 0922   GLUCOSE 174 (H) 01/13/2013 0856   BUN 42 (H) 08/05/2021 0313   BUN 17 07/04/2021 1212   BUN 20.1 01/13/2014 0922   CREATININE 0.93 08/05/2021 0313   CREATININE 0.9 01/13/2014 0922   CALCIUM 7.3 (L)  08/05/2021 0313   CALCIUM 9.9 01/13/2014 0922   GFRNONAA >60 08/05/2021 0313   GFRAA 104 01/06/2021 1512    INR    Component Value Date/Time   INR 1.0 07/25/2021 1500     Intake/Output Summary (Last 24 hours) at 08/05/2021 0954 Last data filed at 08/05/2021 0800 Gross per 24 hour  Intake 5527.56 ml  Output 3305 ml  Net 2222.56 ml     Assessment:  80 y.o. adult is s/p right to left femorofemoral bypass that required to OR takeback's.  He remains on high-dose pressor support and has dusky discoloration of left hand after A-line removal.  He is on heparin drip  Plan: Appreciate consulting services input.  Agree with consideration of palliative care in light of lack of improvement over past 24 hours.  Yosiah Jasmin C. Donzetta Matters, MD [facility] Office: [phone] Pager: [phone]  08/05/2021 9:54 AM

## 2021-08-06 ENCOUNTER — Inpatient Hospital Stay (HOSPITAL_COMMUNITY): Payer: Medicare Other

## 2021-08-06 DIAGNOSIS — A419 Sepsis, unspecified organism: Secondary | ICD-10-CM | POA: Diagnosis not present

## 2021-08-06 DIAGNOSIS — J9601 Acute respiratory failure with hypoxia: Secondary | ICD-10-CM | POA: Diagnosis not present

## 2021-08-06 DIAGNOSIS — I4891 Unspecified atrial fibrillation: Secondary | ICD-10-CM | POA: Diagnosis not present

## 2021-08-06 DIAGNOSIS — R6521 Severe sepsis with septic shock: Secondary | ICD-10-CM | POA: Diagnosis not present

## 2021-08-06 DIAGNOSIS — I739 Peripheral vascular disease, unspecified: Secondary | ICD-10-CM | POA: Diagnosis not present

## 2021-08-06 DIAGNOSIS — R578 Other shock: Secondary | ICD-10-CM | POA: Diagnosis not present

## 2021-08-06 LAB — POCT I-STAT 7, (LYTES, BLD GAS, ICA,H+H)
Acid-base deficit: 1 mmol/L (ref 0.0–2.0)
Acid-base deficit: 3 mmol/L — ABNORMAL HIGH (ref 0.0–2.0)
Bicarbonate: 26.1 mmol/L (ref 20.0–28.0)
Bicarbonate: 23.6 mmol/L (ref 20.0–28.0)
Calcium, Ion: 1.06 mmol/L — ABNORMAL LOW (ref 1.15–1.40)
Calcium, Ion: 1.05 mmol/L — ABNORMAL LOW (ref 1.15–1.40)
HCT: 24 % — ABNORMAL LOW (ref 39.0–52.0)
HCT: 27 % — ABNORMAL LOW (ref 39.0–52.0)
Hemoglobin: 8.2 g/dL — ABNORMAL LOW (ref 13.0–17.0)
Hemoglobin: 9.2 g/dL — ABNORMAL LOW (ref 13.0–17.0)
O2 Saturation: 96 %
O2 Saturation: 93 %
Patient temperature: 37.3
Patient temperature: 36.9
Potassium: 5.2 mmol/L — ABNORMAL HIGH (ref 3.5–5.1)
Potassium: 5.6 mmol/L — ABNORMAL HIGH (ref 3.5–5.1)
Sodium: 155 mmol/L — ABNORMAL HIGH (ref 135–145)
Sodium: 152 mmol/L — ABNORMAL HIGH (ref 135–145)
TCO2: 28 mmol/L (ref 22–32)
TCO2: 25 mmol/L (ref 22–32)
pCO2 arterial: 55.5 mmHg — ABNORMAL HIGH (ref 32.0–48.0)
pCO2 arterial: 49.2 mmHg — ABNORMAL HIGH (ref 32.0–48.0)
pH, Arterial: 7.283 — ABNORMAL LOW (ref 7.350–7.450)
pH, Arterial: 7.288 — ABNORMAL LOW (ref 7.350–7.450)
pO2, Arterial: 98 mmHg (ref 83.0–108.0)
pO2, Arterial: 74 mmHg — ABNORMAL LOW (ref 83.0–108.0)

## 2021-08-06 LAB — GLUCOSE, CAPILLARY
Glucose-Capillary: 302 mg/dL — ABNORMAL HIGH (ref 70–99)
Glucose-Capillary: 342 mg/dL — ABNORMAL HIGH (ref 70–99)
Glucose-Capillary: 380 mg/dL — ABNORMAL HIGH (ref 70–99)
Glucose-Capillary: 414 mg/dL — ABNORMAL HIGH (ref 70–99)
Glucose-Capillary: 380 mg/dL — ABNORMAL HIGH (ref 70–99)

## 2021-08-06 LAB — CULTURE, RESPIRATORY W GRAM STAIN

## 2021-08-06 LAB — BASIC METABOLIC PANEL
Anion gap: 7 (ref 5–15)
BUN: 50 mg/dL — ABNORMAL HIGH (ref 8–23)
CO2: 26 mmol/L (ref 22–32)
Calcium: 6.9 mg/dL — ABNORMAL LOW (ref 8.9–10.3)
Chloride: 120 mmol/L — ABNORMAL HIGH (ref 98–111)
Creatinine, Ser: 1.28 mg/dL — ABNORMAL HIGH (ref 0.61–1.24)
GFR, Estimated: 56 mL/min — ABNORMAL LOW (ref >60)
Glucose, Bld: 342 mg/dL — ABNORMAL HIGH (ref 70–99)
Potassium: 5.3 mmol/L — ABNORMAL HIGH (ref 3.5–5.1)
Sodium: 153 mmol/L — ABNORMAL HIGH (ref 135–145)

## 2021-08-06 LAB — APTT
aPTT: 56 seconds — ABNORMAL HIGH (ref 24–36)
aPTT: 62 seconds — ABNORMAL HIGH (ref 24–36)

## 2021-08-06 LAB — CBC
HCT: 27.7 % — ABNORMAL LOW (ref 39.0–52.0)
Hemoglobin: 8.2 g/dL — ABNORMAL LOW (ref 13.0–17.0)
MCH: 27.9 pg (ref 26.0–34.0)
MCHC: 29.6 g/dL — ABNORMAL LOW (ref 30.0–36.0)
MCV: 94.2 fL (ref 80.0–100.0)
Platelets: 52 10*3/uL — ABNORMAL LOW (ref 150–400)
RBC: 2.94 MIL/uL — ABNORMAL LOW (ref 4.22–5.81)
RDW: 17.6 % — ABNORMAL HIGH (ref 11.5–15.5)
WBC: 16 10*3/uL — ABNORMAL HIGH (ref 4.0–10.5)
nRBC: 0.8 % — ABNORMAL HIGH (ref 0.0–0.2)

## 2021-08-06 LAB — MAGNESIUM: Magnesium: 2.2 mg/dL (ref 1.7–2.4)

## 2021-08-06 LAB — LACTIC ACID, PLASMA: Lactic Acid, Venous: 2 mmol/L (ref 0.5–1.9)

## 2021-08-06 LAB — PHOSPHORUS: Phosphorus: 3.1 mg/dL (ref 2.5–4.6)

## 2021-08-06 MED ORDER — INSULIN GLARGINE-YFGN 100 UNIT/ML ~~LOC~~ SOLN
25.0000 [IU] | Freq: Two times a day (BID) | SUBCUTANEOUS | Status: DC
  Administered 2021-08-06 – 2021-08-07 (×2): 25 [IU] via SUBCUTANEOUS
  Filled 2021-08-06 (×3): qty 0.25

## 2021-08-06 MED ORDER — INSULIN ASPART 100 UNIT/ML IJ SOLN
15.0000 [IU] | Freq: Once | INTRAMUSCULAR | Status: AC
  Administered 2021-08-06: 15 [IU] via SUBCUTANEOUS

## 2021-08-06 MED ORDER — VASOPRESSIN 20 UNITS/100 ML INFUSION FOR SHOCK
0.0000 [IU]/min | INTRAVENOUS | Status: DC
  Administered 2021-08-06 (×2): 0.03 [IU]/min via INTRAVENOUS
  Administered 2021-08-07: 0.02 [IU]/min via INTRAVENOUS
  Filled 2021-08-06 (×3): qty 100

## 2021-08-06 MED ORDER — INSULIN ASPART 100 UNIT/ML IJ SOLN
6.0000 [IU] | INTRAMUSCULAR | Status: DC
  Administered 2021-08-06 – 2021-08-07 (×6): 6 [IU] via SUBCUTANEOUS

## 2021-08-06 NOTE — Plan of Care (Signed)
  Problem: Education: Goal: Knowledge of General Education information will improve Description: Including pain rating scale, medication(s)/side effects and non-pharmacologic comfort measures Outcome: Not Progressing   Problem: Health Behavior/Discharge Planning: Goal: Ability to manage health-related needs will improve Outcome: Not Progressing   Problem: Clinical Measurements: Goal: Ability to maintain clinical measurements within normal limits will improve Outcome: Not Progressing Goal: Will remain free from infection Outcome: Not Progressing Goal: Diagnostic test results will improve Outcome: Not Progressing Goal: Respiratory complications will improve Outcome: Not Progressing Goal: Cardiovascular complication will be avoided Outcome: Not Progressing   Problem: Activity: Goal: Risk for activity intolerance will decrease Outcome: Not Progressing   Problem: Nutrition: Goal: Adequate nutrition will be maintained Outcome: Not Progressing   Problem: Coping: Goal: Level of anxiety will decrease Outcome: Not Progressing   Problem: Elimination: Goal: Will not experience complications related to bowel motility Outcome: Not Progressing Goal: Will not experience complications related to urinary retention Outcome: Not Progressing   Problem: Pain Managment: Goal: General experience of comfort will improve Outcome: Not Progressing   Problem: Safety: Goal: Ability to remain free from injury will improve Outcome: Not Progressing   Problem: Skin Integrity: Goal: Risk for impaired skin integrity will decrease Outcome: Not Progressing   Problem: Education: Goal: Understanding of CV disease, CV risk reduction, and recovery process will improve Outcome: Not Progressing Goal: Individualized Educational Video(s) Outcome: Not Progressing   Problem: Activity: Goal: Ability to return to baseline activity level will improve Outcome: Not Progressing   Problem:  Cardiovascular: Goal: Ability to achieve and maintain adequate cardiovascular perfusion will improve Outcome: Not Progressing Goal: Vascular access site(s) Level 0-1 will be maintained Outcome: Not Progressing   Problem: Health Behavior/Discharge Planning: Goal: Ability to safely manage health-related needs after discharge will improve Outcome: Not Progressing

## 2021-08-06 NOTE — Progress Notes (Signed)
Progress Note  Patient Name: Paul [name] Date of Encounter: 08/06/2021  [facility] Cardiologist: Kirk Ruths, MD   Subjective   Intubated sedate, critically ill  Inpatient Medications    Scheduled Meds:  atorvastatin  80 mg Per Tube Daily   chlorhexidine gluconate (MEDLINE KIT)  15 mL Mouth Rinse BID   Chlorhexidine Gluconate Cloth  6 each Topical Daily   dextrose  1 ampule Intravenous Once   docusate  100 mg Per Tube Daily   feeding supplement (PROSource TF)  45 mL Per Tube TID   free water  200 mL Per Tube Q2H   furosemide  40 mg Intravenous BID   gabapentin  600 mg Per Tube QHS   insulin aspart  0-20 Units Subcutaneous Q4H   insulin aspart  6 Units Subcutaneous Q4H   ipratropium-albuterol  3 mL Nebulization Q4H   mouth rinse  15 mL Mouth Rinse 10 times per day   methylPREDNISolone (SOLU-MEDROL) injection  80 mg Intravenous Daily   pantoprazole sodium  40 mg Per Tube Daily   potassium chloride  40 mEq Per Tube BID   QUEtiapine  25 mg Per Tube BID   sodium chloride flush  10-40 mL Intracatheter Q12H   Continuous Infusions:  sodium chloride     sodium chloride 250 mL (08/05/21 2113)   sodium chloride     amiodarone 30 mg/hr (08/06/21 0700)   bivalirudin (ANGIOMAX) infusion 0.5 mg/mL (Non-ACS indications) 0.1 mg/kg/hr (08/06/21 0700)   ceFEPime (MAXIPIME) IV Stopped (08/06/21 0626)   dexmedetomidine (PRECEDEX) IV infusion 0.3 mcg/kg/hr (08/06/21 0700)   feeding supplement (VITAL 1.5 CAL) 1,000 mL (08/05/21 2126)   fentaNYL infusion INTRAVENOUS 100 mcg/hr (08/06/21 0700)   magnesium sulfate bolus IVPB     norepinephrine (LEVOPHED) Adult infusion 13 mcg/min (08/06/21 0700)   phenylephrine (NEO-SYNEPHRINE) Adult infusion Stopped (08/06/21 0029)   vasopressin 0.03 Units/min (08/06/21 0825)   PRN Meds: sodium chloride, Place/Maintain arterial line **AND** sodium chloride, acetaminophen (TYLENOL) oral liquid 160 mg/5 mL, bisacodyl, fentaNYL,  guaiFENesin-dextromethorphan, magnesium sulfate bolus IVPB, sodium chloride flush   Vital Signs    Vitals:   08/06/21 0645 08/06/21 0700 08/06/21 0731 08/06/21 0800  BP: (!) 74/52 111/69  115/70  Pulse: 76 76 77   Resp: (!) 28 (!) 28 (!) 29 (!) 29  Temp:      TempSrc:      SpO2: 96% 98% 96%   Weight:      Height:        Intake/Output Summary (Last 24 hours) at 08/06/2021 0946 Last data filed at 08/06/2021 0700 Gross per 24 hour  Intake 8340.7 ml  Output 1645 ml  Net 6695.7 ml   Last 3 Weights 08/06/2021 08/05/2021 08/04/2021  Weight (lbs) 249 lb 9 oz 227 lb 4.7 oz 218 lb 7.6 oz  Weight (kg) 113.2 kg 103.1 kg 99.1 kg      Telemetry    Atrial fibrillation heart rate in the 60s.- Personally Reviewed  ECG    08/04/2021-atrial fibrillation heart rate in the 80s with bifascicular block- Personally Reviewed  Physical Exam   GEN: Ill-appearing sedate  HEENT: Intubated Neck: no JVD, carotid bruits, or masses Cardiac: Irregular irregular  no murmurs, rubs, or gallops,no edema  Respiratory: Vent noise  GI: soft, nontender, nondistended, + BS MS: Wound VAC Skin: warm and dry, no rash Neuro: Sedate Psych: Sedated   Labs    High Sensitivity Troponin:  No results for input(s): TROPONINIHS in the last 720 hours.  Chemistry Recent Labs  Lab 08/04/21 0422 08/04/21 4540 08/04/21 1016 08/05/21 0313 08/05/21 1638 08/05/21 2008 08/05/21 2058 08/05/21 2346 08/06/21 0356 08/06/21 0357  NA 149* 148*   < > 154*   < > 150*   < > 155* 153* 155*  K 3.3* 3.0*   < > 3.8   < > 4.3   < > 5.0 5.3* 5.2*  CL 112* 108   < > 119*  --  120*  --   --  120*  --   CO2 26 31   < > 26  --  25  --   --  26  --   GLUCOSE 340* 360*   < > 321*  --  306*  --   --  342*  --   BUN 39* 36*   < > 42*  --  40*  --   --  50*  --   CREATININE 0.92 0.89   < > 0.93  --  0.84  --   --  1.28*  --   CALCIUM 7.5* 6.9*   < > 7.3*  --  6.9*  --   --  6.9*  --   MG 2.1  --   --  2.2  --   --   --   --  2.2  --    PROT  --  4.4*  --   --   --   --   --   --   --   --   ALBUMIN  --  2.1*  --   --   --   --   --   --   --   --   AST  --  22  --   --   --   --   --   --   --   --   ALT  --  17  --   --   --   --   --   --   --   --   ALKPHOS  --  66  --   --   --   --   --   --   --   --   BILITOT  --  1.3*  --   --   --   --   --   --   --   --   GFRNONAA >60 >60   < > >60  --  >60  --   --  56*  --   ANIONGAP 11 9   < > 9  --  5  --   --  7  --    < > = values in this interval not displayed.    Lipids No results for input(s): CHOL, TRIG, HDL, LABVLDL, LDLCALC, CHOLHDL in the last 168 hours.  Hematology Recent Labs  Lab 08/04/21 1854 08/05/21 1214 08/05/21 1638 08/05/21 2346 08/06/21 0356 08/06/21 0357  WBC 15.8* 14.4*  --   --  16.0*  --   RBC 3.18* 3.20*  --   --  2.94*  --   HGB 8.8* 9.0*   < > 8.5* 8.2* 8.2*  HCT 28.6* 28.8*   < > 25.0* 27.7* 24.0*  MCV 89.9 90.0  --   --  94.2  --   MCH 27.7 28.1  --   --  27.9  --   MCHC 30.8 31.3  --   --  29.6*  --   RDW  17.1* 17.2*  --   --  17.6*  --   PLT 127* 63*  --   --  52*  --    < > = values in this interval not displayed.   Thyroid No results for input(s): TSH, FREET4 in the last 168 hours.  BNPNo results for input(s): BNP, PROBNP in the last 168 hours.  DDimer No results for input(s): DDIMER in the last 168 hours.   Radiology    CT ABDOMEN PELVIS WO CONTRAST  Result Date: 08/04/2021 CLINICAL DATA:  Abdominal distension EXAM: CT ABDOMEN AND PELVIS WITHOUT CONTRAST TECHNIQUE: Multidetector CT imaging of the abdomen and pelvis was performed following the standard protocol without IV contrast. COMPARISON:  07/31/2021, 08/06/2021 FINDINGS: Lower chest: There is progressive bibasilar consolidation, asymmetrically more severe within the left lung base with scattered areas of mucous plugging best appreciated within the left lower lobe suspicious for changes of aspiration. Small bilateral pleural effusions have developed. Heterogeneous high  attenuation within the left pleural effusion with punctate foci of gas simply represent collapsed and consolidated lung. Superimposed emphysema. Advanced multi-vessel coronary artery calcification. Global cardiac size within normal limits. Hepatobiliary: No focal liver abnormality is seen. Status post cholecystectomy. No biliary dilatation. 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 Small left adrenal myelolipoma also unchanged. Kidneys are unremarkable. Bladder is decompressed with a Foley catheter balloon seen within its lumen. Stomach/Bowel: Interval development of mild ascites. Nasogastric tube is seen within a decompressed gastric lumen. Stomach, small bowel, and large bowel are otherwise unremarkable. Appendix normal. No free intraperitoneal gas. Vascular/Lymphatic: Extensive aortoiliac atherosclerotic calcification. No aortic aneurysm. Interval femoral femoral bypass grafting has been performed moderate subcutaneous gas and fluid is seen surrounding the bypass graft, likely postsurgical in nature. Previously noted large hematoma within the inferior pannus has been significantly evacuated. The patency of the graft is not well assessed on this noncontrast examination. Extensive atherosclerotic calcification is seen within the visualized lower extremity arterial outflow. No pathologic adenopathy within the abdomen and pelvis. Reproductive: Prostate is unremarkable. Other: There is moderate diffuse subcutaneous body wall edema, new since prior examination. Musculoskeletal: No acute bone abnormality. Osseous structures are diffusely osteopenic. Compression fractures of T12 and L1 status post vertebroplasty are again identified. IMPRESSION: Interval subtotal evacuation of a hematoma within the inferior pannus and femoral femoral bypass grafting. Small residual fluid and subcutaneous gas surrounds the bypass graft, likely postsurgical in nature.  Patency of the graft is not well assessed on this noncontrast examination. Progressive bibasilar pulmonary consolidation, asymmetrically more severe within the left lung base, likely the sequela of aspiration. Emphysema. Extensive multi-vessel coronary artery calcification. Interval development of anasarca with small bilateral pleural effusions, mild ascites, and diffuse body wall subcutaneous edema. Stable bilateral adrenal myelolipoma is, measuring up to 7.5 cm on the right. Aortic Atherosclerosis (ICD10-I70.0) and Emphysema (ICD10-J43.9). Electronically Signed   By: Fidela Salisbury M.D.   On: 08/04/2021 23:48   DG Chest Port 1 View  Result Date: 08/06/2021 CLINICAL DATA:  Hypoxemia EXAM: PORTABLE CHEST 1 VIEW COMPARISON:  08/05/2021 FINDINGS: Endotracheal tube is seen 6.2 cm above the carina. Nasogastric tube extends into the left upper quadrant of the abdomen beyond the margin of the examination. Pulmonary insufflation is stable. Small bilateral pleural effusions are present, stable since prior examination, with associated bibasilar opacification. Superimposed bibasilar pulmonary infiltrate appears stable since prior examination. Scattered infiltrate within the lung apices appears slightly improved. Right upper extremity PICC line again noted within the  superior vena cava. Cardiac size within normal limits. Lower thoracic vertebroplasty again noted. IMPRESSION: Stable support lines and tubes. Slight interval improvement in multifocal pulmonary infiltrates, more prevalent at the lung bases bilaterally, with associated small bilateral pleural effusions. Electronically Signed   By: Fidela Salisbury M.D.   On: 08/06/2021 00:47   DG CHEST PORT 1 VIEW  Result Date: 08/05/2021 CLINICAL DATA:  Follow-up pneumonia. EXAM: PORTABLE CHEST 1 VIEW COMPARISON:  08/05/2021 at 3:29 a.m., and older studies. FINDINGS: Lungs remain hyperexpanded. There is hazy opacity at both lung bases obscuring the hemidiaphragms. Bilateral  interstitial thickening. No pneumothorax. Endotracheal tube tip lies 4.4 cm above the Carina. Nasal/orogastric tube passes below the diaphragm into the stomach. Right PICC tip projects in the mid to lower superior vena cava. IMPRESSION: 1. No significant change from the study obtained earlier today. Bilateral lower lung zone opacities consistent with a combination of atelectasis or pneumonia with pleural effusions. 2. Support apparatus stable, without change from the earlier study. Electronically Signed   By: Lajean Manes M.D.   On: 08/05/2021 16:21   DG CHEST PORT 1 VIEW  Result Date: 08/05/2021 CLINICAL DATA:  Fever, respiratory failure EXAM: PORTABLE CHEST 1 VIEW COMPARISON:  08/03/2021 FINDINGS: Endotracheal tube is seen 6.1 cm above the carina. Nasogastric tube extends into the upper abdomen beyond the margin of the examination. Pulmonary insufflation is stable. Superimposed multifocal pulmonary infiltrates, more focal within the right upper lobe and lung bases bilaterally appears stable when accounting for changes in radiographic technique. No pneumothorax. Small left pleural effusion is present. Cardiac size is within normal limits. Numerous surgical clips are seen within the expected aortopulmonary window. Pulmonary vascularity is normal. No acute bone abnormality. IMPRESSION: Stable support tubes. Stable pulmonary insufflation. Stable multifocal pulmonary infiltrates, likely infectious or inflammatory. Small left pleural effusion again noted. Electronically Signed   By: Fidela Salisbury M.D.   On: 08/05/2021 03:45    Cardiac Studies   ECHO this admit    1. Left ventricular ejection fraction, by estimation, is 60 to 65%. The  left ventricle has normal function. The left ventricle has no regional  wall motion abnormalities. Left ventricular diastolic function could not  be evaluated. There is the  interventricular septum is flattened in systole and diastole, consistent  with right ventricular  pressure and volume overload.   2. Right ventricular systolic function is mildly reduced. The right  ventricular size is moderately enlarged.   3. Left atrial size was mildly dilated.   4. The mitral valve is normal in structure. Mild mitral valve  regurgitation. No evidence of mitral stenosis.   5. The aortic valve is normal in structure. Aortic valve regurgitation is  not visualized. No aortic stenosis is present.   6. The inferior vena cava is normal in size with greater than 50%  respiratory variability, suggesting right atrial pressure of 3 mmHg.   Patient Profile     80 y.o. adult with AFIB, PAD, fever, septic shock, post op fem fem revision  Assessment & Plan     Atrial fibrillation - Heart rates currently stable in the 90s. - On bivalirudin IV for anticoagulation - On phenylephrine for blood pressure support in the setting of shock. Vasopressin - Currently not on any AV nodal blocking agents.  Hyperlipidemia - On atorvastatin 80 mg per tube. No changes  Fever - Up to 103.2 refractory to conservative measures.  Vancomycin was added.  Peripheral arterial disease -07/26/2021 revision right limb of fem-fem by Dr. Virl Cagey  07/21/2021 Fem-fem bypass by Dr. Carlis Abbott  Hypernatremia - Serum sodium rising.  Free water.  Per critical care team.  Septic shock - Currently on phenylephrine. Vasopressin. Levophed. Gravely ill.   Talked to wife. She is understanding the severity of his illness.  She understands that the severity of his illness is likely irreversible.  Appreciate Dr. Lynetta Mare discussion earlier with her.  May be getting close to comfort care measures.  CRITICAL CARE Performed by: Candee Furbish   Total critical care time: 35 minutes  Critical care time was exclusive of separately billable procedures and treating other patients.  Critical care was necessary to treat or prevent imminent or life-threatening deterioration.  Critical care was time spent personally by me on  the following activities: development of treatment plan with patient and/or surrogate as well as nursing, discussions with consultants, evaluation of patient's response to treatment, examination of patient, obtaining history from patient or surrogate, ordering and performing treatments and interventions, ordering and review of laboratory studies, ordering and review of radiographic studies, pulse oximetry and re-evaluation of patient's condition.   For questions or updates, please contact [place] Please consult [email or web address] for contact info under        Signed, Candee Furbish, MD  08/06/2021, 9:46 AM

## 2021-08-06 NOTE — Progress Notes (Signed)
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 Heparin antibody: sent     Patient Measurements: Height: 5' 7.5" (171.5 cm) Weight: 103.1 kg (227 lb 4.7 oz) IBW/kg (Calculated) : 67.25 HEPARIN DW (KG): 87.6   Vital Signs: Temp: 102 F (38.9 C) (09/25 0000) Temp Source: Esophageal (09/25 0000) BP: 108/49 (09/25 0000) Pulse Rate: 103 (09/25 0000)  Labs: Recent Labs    08/03/21 0348 08/03/21 0851 08/04/21 0422 08/04/21 0842 08/04/21 1550 08/04/21 1726 08/04/21 1854 08/05/21 0313 08/05/21 1214 08/05/21 1638 08/05/21 1659 08/05/21 1942 08/05/21 2008 08/05/21 2058 08/05/21 2205 08/05/21 2344 08/05/21 2346  HGB 8.4*   < > 9.2*   < >  --   --  8.8*  --  9.0*   < >  --    < >  --  8.2* 8.2*  --  8.5*  HCT 27.2*   < > 29.2*   < >  --   --  28.6*  --  28.8*   < >  --    < >  --  24.0* 24.0*  --  25.0*  PLT 167  --  183  --   --   --  127*  --  63*  --   --   --   --   --   --   --   --   APTT  --   --   --   --   --   --   --   --   --   --   --   --   --   --   --  62*  --   HEPARINUNFRC 0.44  --   --   --  0.28*  --   --   --   --   --  0.49  --   --   --   --   --   --   CREATININE 1.08   < > 0.92   < >  --  0.82  --  0.93  --   --   --   --  0.84  --   --   --   --    < > = values in this interval not displayed.     Estimated Creatinine Clearance (by C-G formula based on SCr of 0.84 mg/dL) Male: 65.4 mL/min Male: 79.6 mL/min  Assessment: 16 male s/p right to left femoral-femoral bypass on 9/16. Pharmacy consulted to dose heparin for bypass patency. He is noted on apixaban PTA (last dose taken 07/17/21).   Pt s/p re-do of bypass 9/20, heparin resumed postop. Pt with groin bleeding 9/22 - resumed on 9/23.  Heparin level tonight came back therapeutic at 0.49, on 1500 units/hr. No s/sx  of bleeding or infusion issues. Hgb 9, plt now down to 63. Had been 253 on 9/16 with variable trends likely secondary to bleeding and product replacement. 4 Ts score is 4 (intermediate risk). Discussed with CCM and will change to bivalirudin at this time and send off heparin induced antibody.   9/25 AM update:  Initial aPTT therapeutic   Goal of Therapy:  aPTT 50-70s  Monitor platelets by anticoagulation protocol: Yes   Plan:  -Cont bivalirudin infusion at 0.1 mg/kg/hr  -Confirmatory aPTT in 4 hours  -Monitor daily aPTT,  CBC, and for s/sx of bleeding   Narda Bonds, PharmD, [place] Pharmacist Phone: [phone]

## 2021-08-06 NOTE — Progress Notes (Signed)
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 Heparin antibody: sent     Patient Measurements: Height: 5' 7.5" (171.5 cm) Weight: 113.2 kg (249 lb 9 oz) IBW/kg (Calculated) : 67.25 HEPARIN DW (KG): 87.6   Vital Signs: Temp: 97.5 F (36.4 C) (09/25 0600) Temp Source: Esophageal (09/25 0600) BP: 117/67 (09/25 1000) Pulse Rate: 77 (09/25 0731)  Labs: Recent Labs    08/04/21 1550 08/04/21 1726 08/04/21 1854 08/05/21 0313 08/05/21 1214 08/05/21 1638 08/05/21 1659 08/05/21 1942 08/05/21 2008 08/05/21 2058 08/05/21 2344 08/05/21 2346 08/06/21 0356 08/06/21 0357  HGB  --   --  8.8*  --  9.0*   < >  --    < >  --    < >  --  8.5* 8.2* 8.2*  HCT  --   --  28.6*  --  28.8*   < >  --    < >  --    < >  --  25.0* 27.7* 24.0*  PLT  --   --  127*  --  63*  --   --   --   --   --   --   --  52*  --   APTT  --   --   --   --   --   --   --   --   --   --  62*  --  56*  --   HEPARINUNFRC 0.28*  --   --   --   --   --  0.49  --   --   --   --   --   --   --   CREATININE  --    < >  --  0.93  --   --   --   --  0.84  --   --   --  1.28*  --    < > = values in this interval not displayed.     Estimated Creatinine Clearance (by C-G formula based on SCr of 1.28 mg/dL (H)) Male: 45.2 mL/min (A) Male: 54.9 mL/min (A)  Assessment: 39 male s/p right to left femoral-femoral bypass on 9/16. Pharmacy consulted to dose heparin for bypass patency. He is noted on apixaban PTA (last dose taken 07/17/21).   Pt s/p re-do of bypass 9/20, heparin resumed postop. Pt with groin bleeding 9/22 - resumed on 9/23.  Heparin changed to bivalirudin 9/24 with drop in pltc. Hgb stable 8-9, plt now down to 52  4Ts score is 4 (intermediate risk). HIT labs in process. Bivalirudin 0.39m/kg/hr aptt 56 at goal and no bleeding  noted   Goal of Therapy:  aPTT 50-70s  Monitor platelets by anticoagulation protocol: Yes   Plan:  -Cont bivalirudin infusion at 0.1 mg/kg/hr  -Monitor daily aPTT, CBC, and for s/sx of bleeding  -f/u HIT results    LBonnita NasutiPharm.D. CPP, BCPS Clinical Pharmacist [phone]/25/2022 11:21 AM

## 2021-08-06 NOTE — Progress Notes (Addendum)
Pt prevena wound vac battery died, and there was not a replacement canister for this prevena vac. RN noted patients bilateral femoral wounds were not under negative pressure therapy and therefore, switched them to the [facility] Ultra with suctioning at set to 125. Pt had to have both femoral sites reinforced by multiple Tegaderms to get an appropriate seal for the wound vac to function. Approx 31m immediate drained once sealed. Wound vac is functioning properly at this time and the portal prevena is plugged in at bedside charging.

## 2021-08-06 NOTE — Progress Notes (Signed)
NAME:  Paul [name], MRN:  [id number], DOB:  [birth year], LOS: 9 ADMISSION DATE:  07/24/2021, CONSULTATION DATE:  07/24/2021 REFERRING MD:  Dr. Scot Dock, CHIEF COMPLAINT:     History of Present Illness:  HPI obtained from medical chart review as patient remains sedated and intubated on mechanical ventilation.   80 year old male with medical history of tobacco abuse, COPD, prior lung cancer s/p wedge resection LLL 2007, PE (2013), PAD, CAD, afib on Eliquis, DM, HTN, and HLD admitted to vascular surgery on 9/16 for right to left femorofemoral bypass after left great toe wound x 3 months with unsuccessful retrograde and antegrade attempts from brachial access to cross left common iliac occlusion by Dr. Fletcher Anon on 07/19/21.  Patient is followed in our office by Dr. Melvyn Novas.  He continues to smoke.     Patient underwent right to left femoral artery bypass on 9/16.  Course complicated by postop bleeding and hematoma requiring return to the OR on 9/17 for bilateral groin exploration, right iliofemoral embolectomy, left iliofemoral embolectomy, and femoral-femoral bypass revision.  Patient was seen by Cardiology on 9/19 for atrial fibrillation with rapid ventricular rate in which Cardizem was added and maintained on heparin gtt per pharmacy.  Also noted during this time patient had some shortness of breath with CXR showing lung hyperexpansion and chronic bronchitic changes and minimal bibasilar atelectasis without acute process.  He was given lasix with reported improvement.  However, he was found on repeat imaging to have re-occluded bypass prompting return third return to OR on 9/20 for redo of right to left femorofemoral bypass.  He returns the ICU on mechanical ventilation, PCCM consulted for vent management.    Pertinent  Medical History  Ongoing tobacco abuse, COPD, GERD, DM, CAD s/p PCI 1988, Afib on Eliquis, PAD, HTN, HLD, PE 2013, Stage IA (T1, N0, MX) non-small cell lung cancer, adenocarcinoma s/p wedge  resection of LLL and seed implant 01/2006  Significant Hospital Events: Including procedures, antibiotic start and stop dates in addition to other pertinent events   9/16 admitted for right to left femoral artery bypass on 9/16 9/17 post op hematoma/ bleeding, back to OR for s/p bilateral groin exploration, right iliofemoral embolectomy, left iliofemoral embolectomy, femoral-femoral bypass revision 9/19 cards consulted for Afib with RVR-> started on cardizem.  SOB s/p lasix 9/20 CT showing re-occlusion -> back to OR for redo bypass, returns to ICU on mechanical ventilation; PCCM consulted 9/24 stable vasopressor requirements and tolerated brief SBT 9/25 increased vasopressor and O2 requirements overnight   Interim History / Subjective:   Went into AF yesterday afternoon with increase respiratory distress. Started on steroids for COPD exacerbation.   Objective   Blood pressure 115/70, pulse 77, temperature (!) 97.5 F (36.4 C), temperature source Esophageal, resp. rate (!) 29, height 5' 7.5" (1.715 m), weight 113.2 kg, SpO2 96 %. CVP:  [14 mmHg] 14 mmHg  Vent Mode: PRVC FiO2 (%):  [40 %-80 %] 80 % Set Rate:  [28 bmp] 28 bmp Vt Set:  [530 mL] 530 mL PEEP:  [8 cmH20-10 cmH20] 10 cmH20 Pressure Support:  [10 cmH20] 10 cmH20 Plateau Pressure:  [14 cmH20-20 cmH20] 16 cmH20   Intake/Output Summary (Last 24 hours) at 08/06/2021 0935 Last data filed at 08/06/2021 0700 Gross per 24 hour  Intake 8340.7 ml  Output 1645 ml  Net 6695.7 ml    Filed Weights   08/04/21 0417 08/05/21 0323 08/06/21 0431  Weight: 99.1 kg 103.1 kg 113.2 kg  Examination: Gen- chronically and critically ill appearing older adult M intubated sedated NAD  Neuro: sedated pinpoint pupils. Does not follow commands, occasional eye fluttering to voice HEENT: NCAT ETT secure anicteric sclera  Pulm: Symmetrical chest expansion. Mechanically ventilated. No wheezing.  CV: rr s1s2 no rgm  GI- pannus. Soft ndnt.  GU:  edematous penis and scrotum. Skin tear at meatus. Foley  Ext: LUE cyanosis. Stable signs of arterial insufficiency both feet.  Skin- pale, scattered ecchymosis over abdomen. Groin hematomas with bilateral wound [facility]  Resolved Hospital Problem list   Metabolic acidosis  Assessment & Plan:   Critically ill due to acute respiratory failure with hypoxia requiring mechanical ventilation.  Delirium due to cute metabolic encephalopathy Hx COPD. LLE limb ischemia, occluded L iliac artery - s/p R to L femoral artery bypass 9/16.  Course complicated by postop bleeding prompting return OR trip 9/17 with revision  including right and left iliofemoral embolectomy.  Course further complicated by occluded bypass prompting second OR trip 9/20 for redo of R to L femoral - femoral bypass. Post-op hematoma - no further signs of bleeding Hx Afib Hx HTN, HLD. Anemia, acute on chronic  DM2  Right basilar lung nodule   Plan:  - Continue treatment for COPD exacerbation, wean FiO2 as tolerated.  - Wean vasopressors to keep SBP > 120  - Resume diuresis as tolerated.  - Correct hyperglycemia  - If hemodynamics improve, can start weaning sedation again.  - 2.4 cm  RLL nodule, almost appears spiculated, seen on CT 08/03/2021. Previous hx of tage IA (T1, N0, MX) non-small cell lung cancer, adenocarcinoma s/p wedge resection and seed implants of LLL 01/2006.  Appears to last been seen by oncology in 2015.  Can f/u with oncology or follow up in our office with either Dr. Valeta Harms or Dr. Lamonte Sakai for further diagnostic evaluation.   Best Practice (right click and "Reselect all SmartList Selections" daily)   Diet/type: NPO DVT prophylaxis: systemic heparin GI prophylaxis: PPI Lines: N/A Foley:  Yes, and it is still needed Code Status:  full code Last date of multidisciplinary goals of care discussion: 9/25 - spoke with daughter, and wife.  Indicated to them that setback of yesterday is a bad sign and that it may indicate  that the possibility of meaningful recovery has greatly diminished. Will give through this afternoon to see if can get patient back on track, otherwise might need to strongly consider a transition to comfort care.   CRITICAL CARE Performed by: Kipp Brood  Total critical care time: 40 minutes  Critical care time was exclusive of separately billable procedures and treating other patients.  Critical care was necessary to treat or prevent imminent or life-threatening deterioration.  Critical care was time spent personally by me on the following activities: development of treatment plan with patient and/or surrogate as well as nursing, discussions with consultants, evaluation of patient's response to treatment, examination of patient, obtaining history from patient or surrogate, ordering and performing treatments and interventions, ordering and review of laboratory studies, ordering and review of radiographic studies, pulse oximetry and re-evaluation of patient's condition.  Kipp Brood, MD [facility] ICU Physician [place]  Pager: [phone] Or Epic Secure Chat After hours: [phone].  08/06/2021, 9:35 AM     08/06/2021, 9:35 AM

## 2021-08-06 NOTE — Progress Notes (Addendum)
Patient's wife communicated desire to withdraw Tuesday, Dr. Lynetta Mare notified and DNR order placed. [address] notified, referral number: [phone].

## 2021-08-06 NOTE — Progress Notes (Signed)
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 Heparin antibody: sent     Patient Measurements: Height: 5' 7.5" (171.5 cm) Weight: 113.2 kg (249 lb 9 oz) IBW/kg (Calculated) : 67.25 HEPARIN DW (KG): 87.6   Vital Signs: Temp: 98.4 F (36.9 C) (09/25 0425) Temp Source: Esophageal (09/25 0425) BP: 108/65 (09/25 0400) Pulse Rate: 72 (09/25 0327)  Labs: Recent Labs    08/04/21 1550 08/04/21 1726 08/04/21 1854 08/05/21 0313 08/05/21 1214 08/05/21 1638 08/05/21 1659 08/05/21 1942 08/05/21 2008 08/05/21 2058 08/05/21 2344 08/05/21 2346 08/06/21 0356 08/06/21 0357  HGB  --   --  8.8*  --  9.0*   < >  --    < >  --    < >  --  8.5* 8.2* 8.2*  HCT  --   --  28.6*  --  28.8*   < >  --    < >  --    < >  --  25.0* 27.7* 24.0*  PLT  --   --  127*  --  63*  --   --   --   --   --   --   --  52*  --   APTT  --   --   --   --   --   --   --   --   --   --  62*  --  56*  --   HEPARINUNFRC 0.28*  --   --   --   --   --  0.49  --   --   --   --   --   --   --   CREATININE  --    < >  --  0.93  --   --   --   --  0.84  --   --   --  1.28*  --    < > = values in this interval not displayed.     Estimated Creatinine Clearance (by C-G formula based on SCr of 1.28 mg/dL (H)) Male: 45.2 mL/min (A) Male: 54.9 mL/min (A)  Assessment: 37 male s/p right to left femoral-femoral bypass on 9/16. Pharmacy consulted to dose heparin for bypass patency. He is noted on apixaban PTA (last dose taken 07/17/21).   Pt s/p re-do of bypass 9/20, heparin resumed postop. Pt with groin bleeding 9/22 - resumed on 9/23.  Heparin level tonight came back therapeutic at 0.49, on 1500 units/hr. No s/sx of bleeding or infusion issues. Hgb 9, plt now down to 63. Had been 253 on 9/16 with variable trends likely secondary to bleeding  and product replacement. 4 Ts score is 4 (intermediate risk). Discussed with CCM and will change to bivalirudin at this time and send off heparin induced antibody.   9/25 AM update: Initial aPTT therapeutic   9/25 AM update #2: aPTT therapeutic x 2  Goal of Therapy:  aPTT 50-70s  Monitor platelets by anticoagulation protocol: Yes   Plan:  -Cont bivalirudin infusion at 0.1 mg/kg/hr  -Monitor daily aPTT, CBC, and for s/sx of bleeding   Narda Bonds, PharmD, BCPS Clinical Pharmacist Phone: [phone]

## 2021-08-06 NOTE — Progress Notes (Signed)
  Progress Note    08/06/2021 10:12 AM 5 Days Post-Op  Subjective: Remains intubated and sedated  Vitals:   08/06/21 0800 08/06/21 0900  BP: 115/70 124/69  Pulse:    Resp: (!) 29 19  Temp:    SpO2:      Physical Exam: Intubated and sedated Wound vacs bilateral groins have improved suction there is continuous output today Very strong posterior tibial signals bilaterally Feet are less mottled bilaterally Left hand remains mottled which is stable, there is a radial artery signal detectable proximal to previous arterial line placement Diffuse anasarca  CBC    Component Value Date/Time   WBC 16.0 (H) 08/06/2021 0356   RBC 2.94 (L) 08/06/2021 0356   HGB 8.2 (L) 08/06/2021 0357   HGB 13.7 07/04/2021 1212   HGB 14.1 01/13/2014 0922   HCT 24.0 (L) 08/06/2021 0357   HCT 42.1 07/04/2021 1212   HCT 42.8 01/13/2014 0922   PLT 52 (L) 08/06/2021 0356   PLT 219 07/04/2021 1212   MCV 94.2 08/06/2021 0356   MCV 83 07/04/2021 1212   MCV 83.9 01/13/2014 0922   MCH 27.9 08/06/2021 0356   MCHC 29.6 (L) 08/06/2021 0356   RDW 17.6 (H) 08/06/2021 0356   RDW 15.2 07/04/2021 1212   RDW 15.3 (H) 01/13/2014 0922   LYMPHSABS 1.1 07/20/2021 0947   LYMPHSABS 1.5 01/13/2014 0922   MONOABS 0.8 07/20/2021 0947   MONOABS 0.8 01/13/2014 0922   EOSABS 0.1 07/20/2021 0947   EOSABS 0.2 01/13/2014 0922   BASOSABS 0.0 07/20/2021 0947   BASOSABS 0.1 01/13/2014 0922    BMET    Component Value Date/Time   NA 155 (H) 08/06/2021 0357   NA 146 (H) 07/04/2021 1212   NA 140 01/13/2014 0922   K 5.2 (H) 08/06/2021 0357   K 4.6 01/13/2014 0922   CL 120 (H) 08/06/2021 0356   CL 105 01/13/2013 0856   CO2 26 08/06/2021 0356   CO2 26 01/13/2014 0922   GLUCOSE 342 (H) 08/06/2021 0356   GLUCOSE 112 01/13/2014 0922   GLUCOSE 174 (H) 01/13/2013 0856   BUN 50 (H) 08/06/2021 0356   BUN 17 07/04/2021 1212   BUN 20.1 01/13/2014 0922   CREATININE 1.28 (H) 08/06/2021 0356   CREATININE 0.9 01/13/2014 0922    CALCIUM 6.9 (L) 08/06/2021 0356   CALCIUM 9.9 01/13/2014 0922   GFRNONAA 56 (L) 08/06/2021 0356   GFRAA 104 01/06/2021 1512    INR    Component Value Date/Time   INR 1.0 07/25/2021 1500     Intake/Output Summary (Last 24 hours) at 08/06/2021 1012 Last data filed at 08/06/2021 0700 Gross per 24 hour  Intake 8340.7 ml  Output 1645 ml  Net 6695.7 ml     Assessment/plan:  80 y.o. adult is s/p right to left femorofemoral bypass required take back to the OR on 2 occasions.  He remains critically ill on vasopressor support.  He is on heparin drip with atrial fibrillation.  Stable from vascular standpoint.  Care of consulting services much appreciated.  Samyukta Cura C. Donzetta Matters, MD [facility] Office: [phone] Pager: [phone]  08/06/2021 10:12 AM

## 2021-08-07 DIAGNOSIS — Z7901 Long term (current) use of anticoagulants: Secondary | ICD-10-CM | POA: Diagnosis not present

## 2021-08-07 DIAGNOSIS — I4819 Other persistent atrial fibrillation: Secondary | ICD-10-CM

## 2021-08-07 DIAGNOSIS — J9620 Acute and chronic respiratory failure, unspecified whether with hypoxia or hypercapnia: Secondary | ICD-10-CM

## 2021-08-07 DIAGNOSIS — D696 Thrombocytopenia, unspecified: Secondary | ICD-10-CM | POA: Diagnosis not present

## 2021-08-07 DIAGNOSIS — E875 Hyperkalemia: Secondary | ICD-10-CM

## 2021-08-07 DIAGNOSIS — J9601 Acute respiratory failure with hypoxia: Secondary | ICD-10-CM | POA: Diagnosis not present

## 2021-08-07 LAB — CBC
HCT: 24.5 % — ABNORMAL LOW (ref 39.0–52.0)
Hemoglobin: 7 g/dL — ABNORMAL LOW (ref 13.0–17.0)
MCH: 27.5 pg (ref 26.0–34.0)
MCHC: 28.6 g/dL — ABNORMAL LOW (ref 30.0–36.0)
MCV: 96.1 fL (ref 80.0–100.0)
Platelets: 74 10*3/uL — ABNORMAL LOW (ref 150–400)
RBC: 2.55 MIL/uL — ABNORMAL LOW (ref 4.22–5.81)
RDW: 18 % — ABNORMAL HIGH (ref 11.5–15.5)
WBC: 20.1 10*3/uL — ABNORMAL HIGH (ref 4.0–10.5)
nRBC: 0.8 % — ABNORMAL HIGH (ref 0.0–0.2)

## 2021-08-07 LAB — BASIC METABOLIC PANEL
Anion gap: 8 (ref 5–15)
Anion gap: 9 (ref 5–15)
BUN: 69 mg/dL — ABNORMAL HIGH (ref 8–23)
BUN: 74 mg/dL — ABNORMAL HIGH (ref 8–23)
CO2: 24 mmol/L (ref 22–32)
CO2: 23 mmol/L (ref 22–32)
Calcium: 6.9 mg/dL — ABNORMAL LOW (ref 8.9–10.3)
Calcium: 6.9 mg/dL — ABNORMAL LOW (ref 8.9–10.3)
Chloride: 117 mmol/L — ABNORMAL HIGH (ref 98–111)
Chloride: 117 mmol/L — ABNORMAL HIGH (ref 98–111)
Creatinine, Ser: 1.63 mg/dL — ABNORMAL HIGH (ref 0.61–1.24)
Creatinine, Ser: 1.68 mg/dL — ABNORMAL HIGH (ref 0.61–1.24)
GFR, Estimated: 42 mL/min — ABNORMAL LOW (ref >60)
GFR, Estimated: 41 mL/min — ABNORMAL LOW (ref >60)
Glucose, Bld: 347 mg/dL — ABNORMAL HIGH (ref 70–99)
Glucose, Bld: 330 mg/dL — ABNORMAL HIGH (ref 70–99)
Potassium: 5.5 mmol/L — ABNORMAL HIGH (ref 3.5–5.1)
Potassium: 5.6 mmol/L — ABNORMAL HIGH (ref 3.5–5.1)
Sodium: 149 mmol/L — ABNORMAL HIGH (ref 135–145)
Sodium: 149 mmol/L — ABNORMAL HIGH (ref 135–145)

## 2021-08-07 LAB — GLUCOSE, CAPILLARY
Glucose-Capillary: 274 mg/dL — ABNORMAL HIGH (ref 70–99)
Glucose-Capillary: 294 mg/dL — ABNORMAL HIGH (ref 70–99)
Glucose-Capillary: 306 mg/dL — ABNORMAL HIGH (ref 70–99)
Glucose-Capillary: 312 mg/dL — ABNORMAL HIGH (ref 70–99)

## 2021-08-07 LAB — APTT: aPTT: 49 seconds — ABNORMAL HIGH (ref 24–36)

## 2021-08-07 MED ORDER — MIDAZOLAM-SODIUM CHLORIDE 100-0.9 MG/100ML-% IV SOLN
0.0000 mg/h | INTRAVENOUS | Status: DC
  Administered 2021-08-07: 2 mg/h via INTRAVENOUS
  Filled 2021-08-07: qty 100

## 2021-08-07 MED ORDER — ACETAMINOPHEN 325 MG PO TABS: 650.0000 mg | ORAL_TABLET | Freq: Four times a day (QID) | ORAL | Status: DC | PRN

## 2021-08-07 MED ORDER — DIPHENHYDRAMINE HCL 50 MG/ML IJ SOLN: 25.0000 mg | INTRAMUSCULAR | Status: DC | PRN

## 2021-08-07 MED ORDER — GLYCOPYRROLATE 0.2 MG/ML IJ SOLN: 0.2000 mg | INTRAMUSCULAR | Status: DC | PRN

## 2021-08-07 MED ORDER — MIDAZOLAM HCL 2 MG/2ML IJ SOLN: 1.0000 mg | INTRAMUSCULAR | Status: DC | PRN

## 2021-08-07 MED ORDER — POLYVINYL ALCOHOL 1.4 % OP SOLN
1.0000 [drp] | Freq: Four times a day (QID) | OPHTHALMIC | Status: DC | PRN
  Filled 2021-08-07: qty 15

## 2021-08-07 MED ORDER — DEXTROSE 5 % IV SOLN: INTRAVENOUS | Status: DC

## 2021-08-07 MED ORDER — MIDAZOLAM BOLUS VIA INFUSION (WITHDRAWAL LIFE SUSTAINING TX)
2.0000 mg | INTRAVENOUS | Status: DC | PRN
  Filled 2021-08-07: qty 2

## 2021-08-07 MED ORDER — GLYCOPYRROLATE 1 MG PO TABS
1.0000 mg | ORAL_TABLET | ORAL | Status: DC | PRN
  Filled 2021-08-07: qty 1

## 2021-08-07 MED ORDER — DEXTROSE 50 % IV SOLN
1.0000 | Freq: Once | INTRAVENOUS | Status: AC
  Administered 2021-08-07: 50 mL via INTRAVENOUS
  Filled 2021-08-07: qty 50

## 2021-08-07 MED ORDER — GLYCOPYRROLATE 0.2 MG/ML IJ SOLN
0.2000 mg | INTRAMUSCULAR | Status: DC | PRN
  Administered 2021-08-07: 0.2 mg via INTRAVENOUS
  Filled 2021-08-07: qty 1

## 2021-08-07 MED ORDER — ACETAMINOPHEN 650 MG RE SUPP: 650.0000 mg | Freq: Four times a day (QID) | RECTAL | Status: DC | PRN

## 2021-08-07 MED ORDER — INSULIN ASPART 100 UNIT/ML IV SOLN
10.0000 [IU] | Freq: Once | INTRAVENOUS | Status: AC
  Administered 2021-08-07: 10 [IU] via INTRAVENOUS

## 2021-08-07 MED ORDER — SODIUM CHLORIDE 0.9 % IV SOLN: 2.0000 g | Freq: Two times a day (BID) | INTRAVENOUS | Status: DC

## 2021-08-07 MED ORDER — VANCOMYCIN HCL 1500 MG/300ML IV SOLN
1500.0000 mg | Freq: Once | INTRAVENOUS | Status: DC
  Filled 2021-08-07: qty 300

## 2021-08-07 MED ORDER — FENTANYL CITRATE PF 50 MCG/ML IJ SOSY: 25.0000 ug | PREFILLED_SYRINGE | INTRAMUSCULAR | Status: DC | PRN

## 2021-08-08 LAB — HEPARIN INDUCED PLATELET AB (HIT ANTIBODY): Heparin Induced Plt Ab: 0.209 OD (ref 0.000–0.400)

## 2021-08-09 NOTE — Telephone Encounter (Signed)
error 

## 2021-08-10 LAB — CULTURE, BLOOD (ROUTINE X 2)
Culture: NO GROWTH
Culture: NO GROWTH

## 2021-08-12 NOTE — Progress Notes (Signed)
   08/24/2021 1400  Clinical Encounter Type  Visited With Patient and family together  Visit Type Death  Referral From Nurse  Consult/Referral To Chaplain   Chaplain responded. The patient's wife, daughter, and son were at his bedside. Mrs. Krieger requested for the chaplain to pray before extubation. Chaplain provided emotional and grief support for the patient's wife as the attending nurse, Gerald Stabs, and Respiratory, Richardson Landry, extubated the patient. After extubated, the chaplain provided additional spiritual support by reading Psalm 23 as Mrs recited. The chaplain gave the son the Patient Placement card. Mrs said the patient requested to be cremated but has not selected a funeral home or crematory.  This note was prepared by Jeanine Luz, M.Div..  For questions please contact by phone [phone].

## 2021-08-12 NOTE — Progress Notes (Addendum)
Progress Note  Patient Name: Paul [name] Date of Encounter: Aug 13, 2021  Primary Cardiologist: Dr, Stanford Breed  Subjective   Intubated, sedated  Inpatient Medications    Scheduled Meds:  atorvastatin  80 mg Per Tube Daily   chlorhexidine gluconate (MEDLINE KIT)  15 mL Mouth Rinse BID   Chlorhexidine Gluconate Cloth  6 each Topical Daily   dextrose  1 ampule Intravenous Once   docusate  100 mg Per Tube Daily   feeding supplement (PROSource TF)  45 mL Per Tube TID   free water  200 mL Per Tube Q2H   furosemide  40 mg Intravenous BID   gabapentin  600 mg Per Tube QHS   insulin aspart  0-20 Units Subcutaneous Q4H   insulin aspart  6 Units Subcutaneous Q4H   insulin glargine-yfgn  25 Units Subcutaneous BID   ipratropium-albuterol  3 mL Nebulization Q4H   mouth rinse  15 mL Mouth Rinse 10 times per day   methylPREDNISolone (SOLU-MEDROL) injection  80 mg Intravenous Daily   pantoprazole sodium  40 mg Per Tube Daily   QUEtiapine  25 mg Per Tube BID   sodium chloride flush  10-40 mL Intracatheter Q12H   Continuous Infusions:  sodium chloride     sodium chloride 250 mL (08/05/21 2113)   sodium chloride     amiodarone 30 mg/hr (08/13/2021 0600)   bivalirudin (ANGIOMAX) infusion 0.5 mg/mL (Non-ACS indications) 0.11 mg/kg/hr (08/13/2021 0814)   ceFEPime (MAXIPIME) IV Stopped (08/13/2021 0549)   dexmedetomidine (PRECEDEX) IV infusion 0.4 mcg/kg/hr (August 13, 2021 0812)   feeding supplement (VITAL 1.5 CAL) 55 mL/hr at 08-13-2021 0000   fentaNYL infusion INTRAVENOUS 125 mcg/hr (08-13-2021 0931)   magnesium sulfate bolus IVPB     norepinephrine (LEVOPHED) Adult infusion 18 mcg/min (08-13-21 0931)   phenylephrine (NEO-SYNEPHRINE) Adult infusion Stopped (08/06/21 0029)   vasopressin 0.02 Units/min (13-Aug-2021 0600)   PRN Meds: sodium chloride, Place/Maintain arterial line **AND** sodium chloride, acetaminophen (TYLENOL) oral liquid 160 mg/5 mL, bisacodyl, fentaNYL, guaiFENesin-dextromethorphan,  magnesium sulfate bolus IVPB, sodium chloride flush   Vital Signs    Vitals:   Aug 13, 2021 0416 13-Aug-2021 0500 2021-08-13 0600 08-13-2021 0727  BP:  (!) 98/45 110/64   Pulse:  96 (!) 103 99  Resp:  (!) 28 (!) 26 (!) 31  Temp:      TempSrc:      SpO2: 96% 96% 96% 95%  Weight:  115.9 kg    Height:        Intake/Output Summary (Last 24 hours) at 08-13-2021 1030 Last data filed at 2021/08/13 0600 Gross per 24 hour  Intake 2858.34 ml  Output 1390 ml  Net 1468.34 ml    I/O since admission: +24,252  Filed Weights   08/05/21 0323 08/06/21 0431 2021-08-13 0500  Weight: 103.1 kg 113.2 kg 115.9 kg    Telemetry    AFib in th 90s- Personally Reviewed  ECG    ECG (independently read by me): Atrial fibrillation at 96, RBBB  Physical Exam    BP 110/64 (BP Location: Left Arm)   Pulse 99   Temp 98.1 F (36.7 C) (Esophageal)   Resp (!) 31   Ht 5' 7.5" (1.715 m)   Wt 115.9 kg   SpO2 95%   BMI 39.43 kg/m  General: Intubated, sedated  Skin: normal turgor, no rashes,  HEENT: Normocephalic, atraumatic. Nose without nasal septal hypertrophy Neck: No JVD, no carotid bruits; normal carotid upstroke Lungs: clear to ausculatation and percussion; no wheezing or rales Chest wall:  without tenderness to palpitation Heart: PMI not displaced, irregularly irregular in the 90s, s1 s2 normal, 1/6 systolic murmur, no diastolic murmur, no rubs, gallops, thrills, or heaves Abdomen: soft, nontender; no hepatosplenomehaly, BS+; abdominal aorta nontender and not dilated by palpation. Extremities: cyanotic hands and feet  Neurologic: grossly nonfocal; sedated   Labs    Chemistry Recent Labs  Lab 08/04/21 0842 08/04/21 1016 08/05/21 2008 08/05/21 2058 08/06/21 0356 08/06/21 0357 08/06/21 1518 August 21, 2021 0521  NA 148*   < > 150*   < > 153* 155* 152* 149*  K 3.0*   < > 4.3   < > 5.3* 5.2* 5.6* 5.5*  CL 108   < > 120*  --  120*  --   --  117*  CO2 31   < > 25  --  26  --   --  24  GLUCOSE 360*   <  > 306*  --  342*  --   --  347*  BUN 36*   < > 40*  --  50*  --   --  69*  CREATININE 0.89   < > 0.84  --  1.28*  --   --  1.63*  CALCIUM 6.9*   < > 6.9*  --  6.9*  --   --  6.9*  PROT 4.4*  --   --   --   --   --   --   --   ALBUMIN 2.1*  --   --   --   --   --   --   --   AST 22  --   --   --   --   --   --   --   ALT 17  --   --   --   --   --   --   --   ALKPHOS 66  --   --   --   --   --   --   --   BILITOT 1.3*  --   --   --   --   --   --   --   GFRNONAA >60   < > >60  --  56*  --   --  42*  ANIONGAP 9   < > 5  --  7  --   --  8   < > = values in this interval not displayed.     Hematology Recent Labs  Lab 08/04/21 1854 08/05/21 1214 08/05/21 1638 08/06/21 0356 08/06/21 0357 08/06/21 1518  WBC 15.8* 14.4*  --  16.0*  --   --   RBC 3.18* 3.20*  --  2.94*  --   --   HGB 8.8* 9.0*   < > 8.2* 8.2* 9.2*  HCT 28.6* 28.8*   < > 27.7* 24.0* 27.0*  MCV 89.9 90.0  --  94.2  --   --   MCH 27.7 28.1  --  27.9  --   --   MCHC 30.8 31.3  --  29.6*  --   --   RDW 17.1* 17.2*  --  17.6*  --   --   PLT 127* 63*  --  52*  --   --    < > = values in this interval not displayed.    Cardiac EnzymesNo results for input(s): TROPONINI in the last 168 hours. No results for input(s): TROPIPOC in the last 168 hours.   BNPNo results for  input(s): BNP, PROBNP in the last 168 hours.   DDimer No results for input(s): DDIMER in the last 168 hours.   Lipid Panel     Component Value Date/Time   CHOL 66 08/09/2021 0101   CHOL 109 07/08/2020 0826   TRIG 45 07/19/2021 0101   HDL 35 (L) 08/08/2021 0101   HDL 64 07/08/2020 0826   CHOLHDL 1.9 07/31/2021 0101   VLDL 9 08/02/2021 0101   LDLCALC 22 07/30/2021 0101   LDLCALC 33 07/08/2020 0826     Radiology    DG Chest Port 1 View  Result Date: 08/06/2021 CLINICAL DATA:  Hypoxemia EXAM: PORTABLE CHEST 1 VIEW COMPARISON:  08/05/2021 FINDINGS: Endotracheal tube is seen 6.2 cm above the carina. Nasogastric tube extends into the left upper  quadrant of the abdomen beyond the margin of the examination. Pulmonary insufflation is stable. Small bilateral pleural effusions are present, stable since prior examination, with associated bibasilar opacification. Superimposed bibasilar pulmonary infiltrate appears stable since prior examination. Scattered infiltrate within the lung apices appears slightly improved. Right upper extremity PICC line again noted within the superior vena cava. Cardiac size within normal limits. Lower thoracic vertebroplasty again noted. IMPRESSION: Stable support lines and tubes. Slight interval improvement in multifocal pulmonary infiltrates, more prevalent at the lung bases bilaterally, with associated small bilateral pleural effusions. Electronically Signed   By: Fidela Salisbury M.D.   On: 08/06/2021 00:47   DG CHEST PORT 1 VIEW  Result Date: 08/05/2021 CLINICAL DATA:  Follow-up pneumonia. EXAM: PORTABLE CHEST 1 VIEW COMPARISON:  08/05/2021 at 3:29 a.m., and older studies. FINDINGS: Lungs remain hyperexpanded. There is hazy opacity at both lung bases obscuring the hemidiaphragms. Bilateral interstitial thickening. No pneumothorax. Endotracheal tube tip lies 4.4 cm above the Carina. Nasal/orogastric tube passes below the diaphragm into the stomach. Right PICC tip projects in the mid to lower superior vena cava. IMPRESSION: 1. No significant change from the study obtained earlier today. Bilateral lower lung zone opacities consistent with a combination of atelectasis or pneumonia with pleural effusions. 2. Support apparatus stable, without change from the earlier study. Electronically Signed   By: Lajean Manes M.D.   On: 08/05/2021 16:21    Cardiac Studies   ECHO  1. Left ventricular ejection fraction, by estimation, is 60 to 65%. The  left ventricle has normal function. The left ventricle has no regional  wall motion abnormalities. Left ventricular diastolic function could not  be evaluated. There is the   interventricular septum is flattened in systole and diastole, consistent  with right ventricular pressure and volume overload.   2. Right ventricular systolic function is mildly reduced. The right  ventricular size is moderately enlarged.   3. Left atrial size was mildly dilated.   4. The mitral valve is normal in structure. Mild mitral valve  regurgitation. No evidence of mitral stenosis.   5. The aortic valve is normal in structure. Aortic valve regurgitation is  not visualized. No aortic stenosis is present.   6. The inferior vena cava is normal in size with greater than 50%  respiratory variability, suggesting right atrial pressure of 3 mmHg.   Patient Profile     80 y.o. adult with  AFIB, PAD, fever, septic shock, s/p fem fem revision on 08/04/2021.  Assessment & Plan    Atrial fibrillation currently on amiodarone drip at 30 mg/h.  Ventricular rate in the 90s.  2.  Anticoagulation, now on bivalirudin in place of heparin due to low platelets dropping to 52K on  heparin, HIT panel pending.  3.  Septic shock on inotropic support, currently on vasopressin 0.02 units/h and levophed at 18 mcg/min.  Blood pressure currently 108/60  4.  Hyperkalemic: Potassium 5.5 this morning, treated with insulin and D50.  Will repeat.  5.  PVD: Status post initial femoral- femoral bypass July 28, 2021 with revision July 29, 2021 and August 01, 2021 with occluded fem-fem graft.  6.  Elevated glucose, currently 347 this morning.  On insulin scheduled and sliding scale, followed by critical care team  7.  Anemia: Hemoglobin 9.2, hematocrit 27, slightly increased from yesterday  8.  CKD:  BUN and creatinine increased today to the 69/1.63  9.  Hypernatremia, currently getting free water 200 mL every 2 hour  CC time: 40 minutes  Signed, Troy Sine, MD, [facility] 08-25-2021, 10:30 AM

## 2021-08-12 NOTE — Progress Notes (Signed)
Pt extubated per Kipp Brood, MD order for comfort.

## 2021-08-12 NOTE — Progress Notes (Signed)
Inpatient Diabetes Program Recommendations  AACE/ADA: New Consensus Statement on Inpatient Glycemic Control   Target Ranges:  Prepandial:   less than 140 mg/dL      Peak postprandial:   less than 180 mg/dL (1-2 hours)      Critically ill patients:  140 - 180 mg/dL   Results for CHUNG, CHAGOYA (MRN [id number]) as of September 03, 2021 09:41  Ref. Range 03-Sep-2021 00:07 09-03-2021 03:26 09-03-2021 08:00  Glucose-Capillary Latest Ref Range: 70 - 99 mg/dL 274 (H) 312 (H) 306 (H)  Results for ROYE, GUSTAFSON (MRN [id number]) as of 09/03/21 09:41  Ref. Range 08/06/2021 07:45 08/06/2021 11:24 08/06/2021 15:45 08/06/2021 20:23  Glucose-Capillary Latest Ref Range: 70 - 99 mg/dL 342 (H) 380 (H) 414 (H) 380 (H)   Review of Glycemic Control  Diabetes history: DM2 Outpatient Diabetes medications: Jardiance 25 mg daily,  Current orders for Inpatient glycemic control: Semglee 25 units BID, Novolog  6 units Q4H, Novolog 0-20 units Q4H; Vital @ 55 ml/hr, Solumedrol 80 mg daily  Inpatient Diabetes Program Recommendations:    Insulin: Please consider discontinuing all SQ insulin orders and use ICU Glycemic Control Phase 2 IV insulin to get glucose under better control and to help determine insulin needs.  Thanks, Barnie Alderman, RN, MSN, CDE Diabetes Coordinator Inpatient Diabetes Program [phone] (Team Pager from 8am to 5pm)

## 2021-08-12 NOTE — Plan of Care (Signed)

## 2021-08-12 NOTE — Progress Notes (Signed)
NAME:  Paul [name], MRN:  [id number], DOB:  May 29, 1941, LOS: 10 ADMISSION DATE:  07/23/2021, CONSULTATION DATE:  07/31/2021 REFERRING MD:  Dr. Scot Dock, CHIEF COMPLAINT:     History of Present Illness:  HPI obtained from medical chart review as patient remains sedated and intubated on mechanical ventilation.   80 year old male with medical history of tobacco abuse, COPD, prior lung cancer s/p wedge resection LLL 2007, PE (2013), PAD, CAD, afib on Eliquis, DM, HTN, and HLD admitted to vascular surgery on 9/16 for right to left femorofemoral bypass after left great toe wound x 3 months with unsuccessful retrograde and antegrade attempts from brachial access to cross left common iliac occlusion by Dr. Fletcher Anon on 07/19/21.  Patient is followed in our office by Dr. Melvyn Novas.  He continues to smoke.     Patient underwent right to left femoral artery bypass on 9/16.  Course complicated by postop bleeding and hematoma requiring return to the OR on 9/17 for bilateral groin exploration, right iliofemoral embolectomy, left iliofemoral embolectomy, and femoral-femoral bypass revision.  Patient was seen by Cardiology on 9/19 for atrial fibrillation with rapid ventricular rate in which Cardizem was added and maintained on heparin gtt per pharmacy.  Also noted during this time patient had some shortness of breath with CXR showing lung hyperexpansion and chronic bronchitic changes and minimal bibasilar atelectasis without acute process.  He was given lasix with reported improvement.  However, he was found on repeat imaging to have re-occluded bypass prompting return third return to OR on 9/20 for redo of right to left femorofemoral bypass.  He returns the ICU on mechanical ventilation, PCCM consulted for vent management.    Pertinent  Medical History  Ongoing tobacco abuse, COPD, GERD, DM, CAD s/p PCI 1988, Afib on Eliquis, PAD, HTN, HLD, PE 2013, Stage IA (T1, N0, MX) non-small cell lung cancer, adenocarcinoma s/p wedge  resection of LLL and seed implant 01/2006  Significant Hospital Events: Including procedures, antibiotic start and stop dates in addition to other pertinent events   9/16 admitted for right to left femoral artery bypass on 9/16 9/17 post op hematoma/ bleeding, back to OR for s/p bilateral groin exploration, right iliofemoral embolectomy, left iliofemoral embolectomy, femoral-femoral bypass revision 9/19 cards consulted for Afib with RVR-> started on cardizem.  SOB s/p lasix 9/20 CT showing re-occlusion -> back to OR for redo bypass, returns to ICU on mechanical ventilation; PCCM consulted 9/24 stable vasopressor requirements and tolerated brief SBT 9/25 increased vasopressor and O2 requirements overnight  9/26 DNR, family discussing timing of transition to comfort care.   Interim History / Subjective:   Oxygenation has improved but continues on increased vasopressor doses. BP is very labile.   Objective   Blood pressure 108/61, pulse (!) 101, temperature 98 F (36.7 C), temperature source Esophageal, resp. rate (!) 29, height 5' 7.5" (1.715 m), weight 115.9 kg, SpO2 96 %. CVP:  [14 mmHg] 14 mmHg  Vent Mode: PRVC FiO2 (%):  [50 %] 50 % Set Rate:  [28 bmp] 28 bmp Vt Set:  [530 mL] 530 mL PEEP:  [10 cmH20] 10 cmH20 Plateau Pressure:  [21 cmH20-22 cmH20] 21 cmH20   Intake/Output Summary (Last 24 hours) at 08/24/21 1153 Last data filed at 08-24-2021 0800 Gross per 24 hour  Intake 3262.4 ml  Output 1270 ml  Net 1992.4 ml    Filed Weights   08/05/21 0323 08/06/21 0431 08/24/21 0500  Weight: 103.1 kg 113.2 kg 115.9 kg  Examination: Gen- chronically and critically ill appearing older adult M intubated sedated NAD  Neuro: sedated pinpoint pupils. Does not follow commandsHEENT: NCAT ETT secure anicteric sclera  Pulm: Symmetrical chest expansion. Mechanically ventilated. No wheezing.  Tachypnea.  CV: rr s1s2 no rgm  GI- pannus. Soft ndnt.  GU: edematous penis and scrotum. Skin tear  at meatus. Foley in place Ext: LUE cyanosis. Stable signs of arterial insufficiency both feet.  Skin- pale, scattered ecchymosis over abdomen. Groin hematomas with bilateral wound [facility]  Resolved Hospital Problem list   Metabolic acidosis  Assessment & Plan:   Critically ill due to acute respiratory failure with hypoxia requiring mechanical ventilation.  Delirium due to cute metabolic encephalopathy Hx COPD. LLE limb ischemia, occluded L iliac artery - s/p R to L femoral artery bypass 9/16.  Course complicated by postop bleeding prompting return OR trip 9/17 with revision  including right and left iliofemoral embolectomy.  Course further complicated by occluded bypass prompting second OR trip 9/20 for redo of R to L femoral - femoral bypass. Post-op hematoma - no further signs of bleeding Hx Afib Hx HTN, HLD. Anemia, acute on chronic  DM2  Right basilar lung nodule   Plan:  - DNR, no escalation of care.  - Spoke with wife and daughter at length regarding the patient's condition. He is requiring increasing support without clear source of infection/cause. I think that this is a sign of overall frailty and lack of physiologic reserve. He is dying and the only question is whether to prolong this process or allow it to proceed naturally. I indicated to the family that comfort can be more readily ensured with the latter. Dr Carlis Abbott who joined the conversation reassured them that the decision to proceed with surgery was still the right one given that the other option would have been amputation which would have led to a dismal quality of life.  The wife is struggling with the ultimate decision to transition to comfort care and wishes some time for reflection. We have agreed to not escalate and that we plan to proceed to compassionate extubation no later than tomorrow morning.   Best Practice (right click and "Reselect all SmartList Selections" daily)   Diet/type: NPO DVT prophylaxis: systemic  heparin GI prophylaxis: PPI Lines: N/A Foley:  Yes, and it is still needed Code Status:  full code Last date of multidisciplinary goals of care discussion: 9/25 - spoke with daughter, and wife.  Indicated to them that setback of yesterday is a bad sign and that it may indicate that the possibility of meaningful recovery has greatly diminished. Will give through this afternoon to see if can get patient back on track, otherwise might need to strongly consider a transition to comfort care.   CRITICAL CARE Performed by: Kipp Brood  Total critical care time: 40 minutes  Critical care time was exclusive of separately billable procedures and treating other patients.  Critical care was necessary to treat or prevent imminent or life-threatening deterioration.  Critical care was time spent personally by me on the following activities: development of treatment plan with patient and/or surrogate as well as nursing, discussions with consultants, evaluation of patient's response to treatment, examination of patient, obtaining history from patient or surrogate, ordering and performing treatments and interventions, ordering and review of laboratory studies, ordering and review of radiographic studies, pulse oximetry and re-evaluation of patient's condition.  Kipp Brood, MD [facility] ICU Physician [place]  Pager: [phone] Or Epic Secure Chat After hours:  [phone].  August 15, 2021, 11:53 AM     15-Aug-2021, 11:53 AM

## 2021-08-12 NOTE — Discharge Summary (Signed)
[facility] Discharge Summary   Patient ID:  Paul [name] MRN: [id number] DOB/AGE: Sep 04, 1941 80 y.o.  Admit date: 08/04/2021 Discharge date: 08/26/21 Date of Surgery: 07/25/2021 Surgeon: Surgeon(s): Paul Mould, MD Paul John, MD  Admission Diagnosis: PAD (peripheral artery disease) [facility]) [I73.9]  Discharge Diagnoses:  PAD (peripheral artery disease) ([place]) [I73.9]  Secondary Diagnoses: Past Medical History:  Diagnosis Date   Asthma    Atrial fibrillation ([place])    CAD (coronary artery disease)    Prior PTCA   COPD (chronic obstructive pulmonary disease) ([place])    Diabetes mellitus    GERD (gastroesophageal reflux disease)    Hard of hearing    Hyperlipidemia    Hypertension    lung ca dx'd 01/2006   Nephrolithiasis    Neuropathy    Osteopenia    Pulmonary embolus ([place])    Tibia/fibula fracture 07/2016   Left   Tremor, essential     Procedure(s): REVISION BYPASS GRAFT FEMORAL-FEMORAL ARTERY  Discharged Condition: Deceased  HPI: This is a 80 y.o. adult with history of diabetes, hypertension, hyperlipidemia, A. fib on Eliquis, COPD, and tobacco abuse that vascular surgery has been consulted for right to left femorofemoral bypass.  Patient has a wound on his left great toe.  He states this wound has been present for about 3 months.  Initially evaluated by podiatry then referred to Paul [name].  He has had both retrograde and antegrade attempts from brachial access to cross left common iliac occlusion.  Retrograde attempt was unsuccessful and antegrade attempt from brachial access today the lesion was crossed but could not get any catheter to track.  He was scheduled for right to left fem-fem bypass.   Hospital Course:  Paul [name] is a 80 y.o. adult is S/P  07/27/2021 Right to left femoral artery to femoral artery bypass with 8 mm dacron graft by Paul [name]  08/06/2021 he developed a small hematoma in the right groin.  This was  non-pulsatile and appeared focal so we elected to treat conservatively with bedrest.   CT angiography demonstrated a large pseudoaneurysm with arterial bleeding likely from the right-sided anastomosis. The bypass does not fill with contrast.   He was taken back to the OR for revision and thrombectomy of Fem-fem bypass.  He had B PT doppler signals, OOB to chair.  He was placed on full dose Heparin for anticoagulation due to history of A fib.  On 9/19 he develops a cough and saline Nebulizer were initiated.   Difficulty with rate control 117-124 Cardiology consulted was called 07/31/21.  Nebulizer was changed to Albuterol and respiratory consult was placed.  He has a history of COPD.    Cardiology added cardizem 9m q 6 hours>> transition to long acting tomorrow.  CT angio small bilateral pleural effusions with bibasilar atelectasis or infiltrate. Chest x ray demonstrates mild atelectasis.  He was given lasix for sever SOB and volume over load.    07/19/2021 he had decreased inflow to the left LE.  The fem-fem re occluded and he returned to the OR with Paul [name] Redo right to left femoral-femoral bypass (8 mm externally supported PTFE graft).  He remained intubated and sedated.  Continued Heparin, Cardizem.     Anemia due to surgical blood loss Post transfusion 4 units PRBC And 2 FFP HGB stable 9.1.  He was supported with Neo.  He maintained B LE signals.  Extubations attempted failed.  He developed left hand ischemia, as well  as toes with dusky ischemia.   A line was discontinued and Heparin was maintained.   Family decided on comfort care and he was made DNR 08/06/21.    He was extubated 08-30-2021 and was deceased.       Significant Diagnostic Studies: CBC Lab Results  Component Value Date   WBC 20.1 (H) 08-30-21   HGB 7.0 (L) 08-30-2021   HCT 24.5 (L) 08/30/2021   MCV 96.1 08/30/2021   PLT 74 (L) 08/30/2021    BMET    Component Value Date/Time   NA 149 (H) 30-Aug-2021 0950   NA 146 (H)  07/04/2021 1212   NA 140 01/13/2014 0922   K 5.6 (H) 08-30-2021 0950   K 4.6 01/13/2014 0922   CL 117 (H) 2021/08/30 0950   CL 105 01/13/2013 0856   CO2 23 08/30/21 0950   CO2 26 01/13/2014 0922   GLUCOSE 330 (H) 30-Aug-2021 0950   GLUCOSE 112 01/13/2014 0922   GLUCOSE 174 (H) 01/13/2013 0856   BUN 74 (H) 30-Aug-2021 0950   BUN 17 07/04/2021 1212   BUN 20.1 01/13/2014 0922   CREATININE 1.68 (H) 2021-08-30 0950   CREATININE 0.9 01/13/2014 0922   CALCIUM 6.9 (L) 08-30-21 0950   CALCIUM 9.9 01/13/2014 0922   GFRNONAA 41 (L) 08-30-21 0950   GFRAA 104 01/06/2021 1512   COAG Lab Results  Component Value Date   INR 1.0 07/25/2021   INR 1.0 07/20/2021   INR 0.99 08/09/2016     Disposition:  Discharge to : Deceased  Allergies as of 08/30/2021       Reactions   Codeine Anaphylaxis   Phenobarbital Hypertension   Heparin    HIT workup in process Heparin antibody: sent         Medication List     ASK your doctor about these medications    acetaminophen 500 MG tablet Commonly known as: TYLENOL Take 1,000 mg by mouth every 8 (eight) hours as needed for moderate pain.   albuterol 108 (90 Base) MCG/ACT inhaler Commonly known as: VENTOLIN HFA Inhale 2 puffs into the lungs every 2 (two) hours as needed for wheezing or shortness of breath.   atorvastatin 40 MG tablet Commonly known as: LIPITOR TAKE 1 TABLET BY MOUTH AT  BEDTIME   calcium carbonate 600 MG Tabs tablet Commonly known as: OS-CAL Take 600 mg by mouth every evening.   Centrum Silver Adult 50+ Tabs Take 1 tablet by mouth daily with breakfast.   Eliquis 5 MG Tabs tablet Generic drug: apixaban TAKE 1 TABLET BY MOUTH  TWICE DAILY   empagliflozin 25 MG Tabs tablet Commonly known as: JARDIANCE Take 25 mg by mouth daily.   fexofenadine 180 MG tablet Commonly known as: ALLEGRA Take 180 mg by mouth daily.   fluticasone 50 MCG/ACT nasal spray Commonly known as: FLONASE Place 1 spray into the nose 2  (two) times daily.   furosemide 20 MG tablet Commonly known as: LASIX TAKE 1 TABLET BY MOUTH  DAILY   gabapentin 300 MG capsule Commonly known as: NEURONTIN Take 1,200 mg by mouth at bedtime. 4 at bedtime   guaiFENesin 600 MG 12 hr tablet Commonly known as: MUCINEX Take 1,200 mg by mouth daily with breakfast.   losartan 50 MG tablet Commonly known as: Cozaar Take 1 tablet (50 mg total) by mouth daily.   metFORMIN 1000 MG tablet Commonly known as: GLUCOPHAGE Take 1,000 mg by mouth 2 (two) times daily with a meal.   metoCLOPramide 5  MG tablet Commonly known as: REGLAN Take 5 mg by mouth 3 (three) times daily before meals.   omeprazole 20 MG tablet Commonly known as: PRILOSEC OTC Take 20 mg by mouth daily as needed (heartburn).   OXYGEN Inhale 3-4 L into the lungs continuous. 4L of oxygen at night and 3L of oxygen when walking long distances   Shingrix injection Generic drug: Zoster Vaccine Adjuvanted   vitamin C 250 MG tablet Commonly known as: ASCORBIC ACID Take 250 mg by mouth daily.   Vitamin D3 20 MCG (800 UNIT) Tabs Take 800 Units by mouth daily.       Verbal and written Discharge instructions given to the patient. Wound care per Discharge AVS   Signed: Roxy Horseman 08/09/2021, 8:43 AM

## 2021-08-12 NOTE — Progress Notes (Addendum)
[facility] [place]  Subjective  - Intubated and sedated   Objective 110/64 (!) 103 98.1 F (36.7 C) (Esophageal) (!) 26 96%  Intake/Output Summary (Last 24 hours) at 08-28-21 0716 Last data filed at 28-Aug-2021 0600 Gross per 24 hour  Intake 3328.79 ml  Output 1390 ml  Net 1938.79 ml    PT/DP signals B LE, toes are dusky, left hand with ischemic finger tips, radial doppler signal Incisional vacs in place B groins, minimal OP Intubated sedated Heart A fib  Assessment/Planning: 80 y.o. adult is s/p right to left femorofemoral bypass required take back to the OR on 2 occasions.   He remains critically ill on vasopressor Neo at 15.  Mottled left hand and B feet.  Signals with doppler intact B LE and left radial. Heparin cont. Urine Op good >1,300 ml, + fluid 24000 Feeding supplement NG tube      Roxy Horseman 81-Oct-2022 7:16 AM --  Laboratory Lab Results: Recent Labs    08/05/21 1214 08/05/21 1638 08/06/21 0356 08/06/21 0357 08/06/21 1518  WBC 14.4*  --  16.0*  --   --   HGB 9.0*   < > 8.2* 8.2* 9.2*  HCT 28.8*   < > 27.7* 24.0* 27.0*  PLT 63*  --  52*  --   --    < > = values in this interval not displayed.   BMET Recent Labs    08/06/21 0356 08/06/21 0357 08/06/21 1518 2021-08-28 0521  NA 153*   < > 152* 149*  K 5.3*   < > 5.6* 5.5*  CL 120*  --   --  117*  CO2 26  --   --  24  GLUCOSE 342*  --   --  347*  BUN 50*  --   --  69*  CREATININE 1.28*  --   --  1.63*  CALCIUM 6.9*  --   --  6.9*   < > = values in this interval not displayed.    COAG Lab Results  Component Value Date   INR 1.0 07/25/2021   INR 1.0 07/20/2021   INR 0.99 08/09/2016   No results found for: PTT   I have seen and evaluated the patient. I agree with the PA note as documented above.  Status post right to left femorofemoral bypass for left iliac occlusion in the setting of CLI with tissue loss in left lower extremity.  Ultimately he has  required multiple revisions last week.  After most recent revision has remained in multisystem organ failure in the ICU.  Remains intubated and on multiple pressors including Levophed and vasopressin.  He does have excellent PT signals bilaterally and appears his femorofemoral graft remains patent.  Has worsening renal dysfunction.  Unable to wean from pressors over the weekend.  Afib with RVR on amiodarone and cardiology following.  Has cyanosis to his left hand as well as lower extremities even with good signals.  I met with the family at bedside as well as critical care.  They are moving toward comfort measures and he was made DNR last night.  I certainly support their decision given his critical illness and no improvement over the weekend.  We discussed ultimately could continue care with long-term plan for likely trach and nursing facility which family states he would not want.  Certainly appreciate critical care.  Marty Heck, MD [facility] [place] Office: [phone]

## 2021-08-12 NOTE — Progress Notes (Signed)
eLink Physician-Brief Progress Note Patient Name: Paul [name] DOB: [birth year] MRN: [id number]   Date of Service  Aug 13, 2021  HPI/Events of Note  Notified of K 5.5 Creatinine 1.63 On norepinephrine  eICU Interventions  Ordered D50/insulin Bedside team to assess if furosemide may be resumed if BP permits     Intervention Category Intermediate Interventions: Electrolyte abnormality - evaluation and management  Judd Lien 2021/08/13, 6:45 AM

## 2021-08-12 NOTE — Progress Notes (Signed)
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 Heparin antibody: sent     Patient Measurements: Height: 5' 7.5" (171.5 cm) Weight: 115.9 kg (255 lb 8.2 oz) IBW/kg (Calculated) : 67.25 HEPARIN DW (KG): 87.6   Vital Signs: Temp: 98.1 F (36.7 C) (09/26 0400) Temp Source: Esophageal (09/26 0400) BP: 110/64 (09/26 0600) Pulse Rate: 99 (09/26 0727)  Labs: Recent Labs    08/04/21 1550 08/04/21 1726 08/04/21 1854 08/05/21 0313 08/05/21 1214 08/05/21 1638 08/05/21 1659 08/05/21 1942 08/05/21 2008 08/05/21 2058 08/05/21 2344 08/05/21 2346 08/06/21 0356 08/06/21 0357 08/06/21 1518 August 21, 2021 0521  HGB  --   --  8.8*  --  9.0*   < >  --    < >  --    < >  --    < > 8.2* 8.2* 9.2*  --   HCT  --   --  28.6*  --  28.8*   < >  --    < >  --    < >  --    < > 27.7* 24.0* 27.0*  --   PLT  --   --  127*  --  63*  --   --   --   --   --   --   --  52*  --   --   --   APTT  --   --   --   --   --   --   --   --   --   --  62*  --  56*  --   --  49*  HEPARINUNFRC 0.28*  --   --   --   --   --  0.49  --   --   --   --   --   --   --   --   --   CREATININE  --    < >  --    < >  --   --   --   --  0.84  --   --   --  1.28*  --   --  1.63*   < > = values in this interval not displayed.     Estimated Creatinine Clearance (by C-G formula based on SCr of 1.63 mg/dL (H)) Male: 35.9 mL/min (A) Male: 43.6 mL/min (A)  Assessment: 38 male s/p right to left femoral-femoral bypass on 9/16. Pharmacy consulted to dose heparin for bypass patency. He is noted on apixaban PTA (last dose taken 07/17/21).   Pt s/p re-do of bypass 9/20, heparin resumed postop. Pt with groin bleeding 9/22 - resumed on 9/23.  Heparin changed to bivalirudin 9/24 with drop in pltc. Hgb stable 8-9, plt now down to 52  4Ts score is 4  (intermediate risk). HIT labs in process. Bivalirudin 0.54m/kg/hr aptt 49, just under goal and no bleeding noted. Will increase rate slightly to keep within therapeutic range.   Goal of Therapy:  aPTT 50-70s  Monitor platelets by anticoagulation protocol: Yes   Plan:  -Slightly increase bivalirudin infusion to 0.11 mg/kg/hr  -Monitor daily aPTT, CBC, and for s/sx of bleeding  -f/u HIT results   Thank you for allowing pharmacy to participate in this patient's care.  NReatha Harps PharmD PGY1 Pharmacy Resident 9Oct 10, 20227:37 AM Check [email or web address]  for unit specific pharmacy number

## 2021-08-12 NOTE — Progress Notes (Deleted)
Pharmacy Antibiotic Note  Paul [name] is a 80 y.o. adult admitted on 08/10/2021 with PVD s/p bypass. Pt now with fevers and unclear source. Pharmacy has been consulted for vancomycin and cefepime dosing. Will decrease cefepime dose based on CrCl <50. Continue vancomycin and follow goals of care discussions to determine need for further monitoring. Scr 1.68  Plan: Cefepime 2g IV q12h Vanc 1.5g IV once and reevaluate 9/27 F/U cultures  Height: 5' 7.5" (171.5 cm) Weight: 115.9 kg (255 lb 8.2 oz) IBW/kg (Calculated) : 67.25  Temp (24hrs), Avg:99.1 F (37.3 C), Min:98 F (36.7 C), Max:100.4 F (38 C)  Recent Labs  Lab 08/03/21 0849 08/03/21 1213 08/03/21 2224 08/04/21 0422 08/04/21 0842 08/04/21 1854 08/05/21 0313 08/05/21 1214 08/05/21 2008 08/06/21 0356 08/06/21 0607 29-Aug-2021 0521 08/29/21 0800 08-29-2021 0950  WBC  --   --   --  12.4*  --  15.8*  --  14.4*  --  16.0*  --   --  20.1*  --   CREATININE  --   --    < > 0.92   < >  --  0.93  --  0.84 1.28*  --  1.63*  --  1.68*  LATICACIDVEN 1.1 1.1  --   --   --   --   --   --  2.5*  --  2.0*  --   --   --    < > = values in this interval not displayed.     Estimated Creatinine Clearance (by C-G formula based on SCr of 1.68 mg/dL (H)) Male: 34.8 mL/min (A) Male: 42.3 mL/min (A)    Allergies  Allergen Reactions   Codeine Anaphylaxis   Phenobarbital Hypertension   Heparin     HIT workup in process Heparin antibody: sent    Thank you for allowing pharmacy to participate in this patient's care.  Reatha Harps, PharmD PGY1 Pharmacy Resident 08/29/2021 2:24 PM Check [email or web address] for unit specific pharmacy number

## 2021-08-12 DEATH — deceased

## 2021-08-14 ENCOUNTER — Ambulatory Visit: Payer: Medicare Other | Admitting: Podiatry

## 2021-09-05 ENCOUNTER — Encounter (HOSPITAL_COMMUNITY): Payer: Self-pay | Admitting: Vascular Surgery

## 2021-10-16 ENCOUNTER — Ambulatory Visit: Payer: Medicare Other | Admitting: Neurology

## 2021-10-24 ENCOUNTER — Ambulatory Visit: Payer: Medicare Other | Admitting: Cardiovascular Disease

## 2022-01-10 ENCOUNTER — Ambulatory Visit: Payer: Medicare Other | Admitting: Cardiology
